# Patient Record
Sex: Female | Born: 1955
Health system: Southern US, Community
[De-identification: ages and names within clinical notes are randomized; demographics above are authoritative.]

## PROBLEM LIST (undated history)

## (undated) DIAGNOSIS — Z9884 Bariatric surgery status: Secondary | ICD-10-CM

## (undated) DIAGNOSIS — H2513 Age-related nuclear cataract, bilateral: Secondary | ICD-10-CM

## (undated) DIAGNOSIS — T4145XA Adverse effect of unspecified anesthetic, initial encounter: Secondary | ICD-10-CM

## (undated) DIAGNOSIS — R011 Cardiac murmur, unspecified: Secondary | ICD-10-CM

## (undated) DIAGNOSIS — E785 Hyperlipidemia, unspecified: Secondary | ICD-10-CM

## (undated) DIAGNOSIS — M7580 Other shoulder lesions, unspecified shoulder: Secondary | ICD-10-CM

## (undated) DIAGNOSIS — G4733 Obstructive sleep apnea (adult) (pediatric): Secondary | ICD-10-CM

## (undated) DIAGNOSIS — G629 Polyneuropathy, unspecified: Secondary | ICD-10-CM

## (undated) DIAGNOSIS — F329 Major depressive disorder, single episode, unspecified: Secondary | ICD-10-CM

## (undated) DIAGNOSIS — E119 Type 2 diabetes mellitus without complications: Secondary | ICD-10-CM

## (undated) DIAGNOSIS — IMO0001 Reserved for inherently not codable concepts without codable children: Secondary | ICD-10-CM

## (undated) DIAGNOSIS — E669 Obesity, unspecified: Secondary | ICD-10-CM

## (undated) DIAGNOSIS — H409 Unspecified glaucoma: Secondary | ICD-10-CM

## (undated) DIAGNOSIS — Z9989 Dependence on other enabling machines and devices: Secondary | ICD-10-CM

## (undated) DIAGNOSIS — Z8669 Personal history of other diseases of the nervous system and sense organs: Secondary | ICD-10-CM

## (undated) DIAGNOSIS — R350 Frequency of micturition: Secondary | ICD-10-CM

## (undated) DIAGNOSIS — R51 Headache: Secondary | ICD-10-CM

## (undated) DIAGNOSIS — E041 Nontoxic single thyroid nodule: Secondary | ICD-10-CM

## (undated) DIAGNOSIS — Z789 Other specified health status: Secondary | ICD-10-CM

## (undated) DIAGNOSIS — T8859XA Other complications of anesthesia, initial encounter: Secondary | ICD-10-CM

## (undated) DIAGNOSIS — G5600 Carpal tunnel syndrome, unspecified upper limb: Secondary | ICD-10-CM

## (undated) DIAGNOSIS — F32A Depression, unspecified: Secondary | ICD-10-CM

## (undated) DIAGNOSIS — F419 Anxiety disorder, unspecified: Secondary | ICD-10-CM

## (undated) DIAGNOSIS — H532 Diplopia: Secondary | ICD-10-CM

## (undated) DIAGNOSIS — T7840XA Allergy, unspecified, initial encounter: Secondary | ICD-10-CM

## (undated) DIAGNOSIS — D414 Neoplasm of uncertain behavior of bladder: Secondary | ICD-10-CM

## (undated) DIAGNOSIS — E559 Vitamin D deficiency, unspecified: Secondary | ICD-10-CM

## (undated) DIAGNOSIS — K648 Other hemorrhoids: Secondary | ICD-10-CM

## (undated) DIAGNOSIS — N951 Menopausal and female climacteric states: Secondary | ICD-10-CM

## (undated) DIAGNOSIS — G7000 Myasthenia gravis without (acute) exacerbation: Secondary | ICD-10-CM

## (undated) DIAGNOSIS — I10 Essential (primary) hypertension: Secondary | ICD-10-CM

## (undated) DIAGNOSIS — Z6841 Body Mass Index (BMI) 40.0 and over, adult: Secondary | ICD-10-CM

## (undated) DIAGNOSIS — J45909 Unspecified asthma, uncomplicated: Secondary | ICD-10-CM

## (undated) DIAGNOSIS — R519 Headache, unspecified: Secondary | ICD-10-CM

## (undated) DIAGNOSIS — G4730 Sleep apnea, unspecified: Secondary | ICD-10-CM

## (undated) HISTORY — DX: Neoplasm of uncertain behavior of bladder: D41.4

## (undated) HISTORY — DX: Vitamin D deficiency, unspecified: E55.9

## (undated) HISTORY — DX: Carpal tunnel syndrome, unspecified upper limb: G56.00

## (undated) HISTORY — DX: Type 2 diabetes mellitus without complications: E11.9

## (undated) HISTORY — DX: Sleep apnea, unspecified: G47.30

## (undated) HISTORY — PX: EYE SURGERY: SHX253

## (undated) HISTORY — DX: Depression, unspecified: F32.A

## (undated) HISTORY — DX: Other shoulder lesions, unspecified shoulder: M75.80

## (undated) HISTORY — DX: Obesity, unspecified: E66.9

## (undated) HISTORY — DX: Bariatric surgery status: Z98.84

## (undated) HISTORY — DX: Age-related nuclear cataract, bilateral: H25.13

## (undated) HISTORY — PX: KNEE SURGERY: SHX244

## (undated) HISTORY — DX: Headache, unspecified: R51.9

## (undated) HISTORY — DX: Frequency of micturition: R35.0

## (undated) HISTORY — DX: Hyperlipidemia, unspecified: E78.5

## (undated) HISTORY — DX: Major depressive disorder, single episode, unspecified: F32.9

## (undated) HISTORY — DX: Allergy, unspecified, initial encounter: T78.40XA

## (undated) HISTORY — DX: Anxiety disorder, unspecified: F41.9

## (undated) HISTORY — DX: Unspecified glaucoma: H40.9

## (undated) HISTORY — PX: OOPHORECTOMY: SHX86

## (undated) HISTORY — DX: Essential (primary) hypertension: I10

## (undated) HISTORY — PX: THYROID SURGERY: SHX805

## (undated) HISTORY — DX: Unspecified asthma, uncomplicated: J45.909

## (undated) HISTORY — DX: Other hemorrhoids: K64.8

## (undated) HISTORY — DX: Polyneuropathy, unspecified: G62.9

## (undated) HISTORY — DX: Dependence on other enabling machines and devices: Z99.89

## (undated) HISTORY — DX: Menopausal and female climacteric states: N95.1

## (undated) HISTORY — DX: Headache: R51

## (undated) HISTORY — PX: UPPER GI ENDOSCOPY: SHX6162

## (undated) HISTORY — DX: Diplopia: H53.2

## (undated) HISTORY — DX: Obstructive sleep apnea (adult) (pediatric): G47.33

---
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
## 1989-06-24 HISTORY — PX: ABDOMINAL HYSTERECTOMY: SHX81

## 2004-06-18 ENCOUNTER — Emergency Department: Payer: Self-pay | Admitting: Emergency Medicine

## 2004-06-22 ENCOUNTER — Ambulatory Visit: Payer: Self-pay

## 2004-07-04 ENCOUNTER — Ambulatory Visit: Payer: Self-pay

## 2004-07-12 ENCOUNTER — Ambulatory Visit: Payer: Self-pay | Admitting: Unknown Physician Specialty

## 2004-07-13 ENCOUNTER — Ambulatory Visit: Payer: Self-pay

## 2004-07-16 ENCOUNTER — Ambulatory Visit: Payer: Self-pay | Admitting: Urology

## 2005-11-06 ENCOUNTER — Ambulatory Visit: Payer: Self-pay

## 2006-01-11 ENCOUNTER — Ambulatory Visit: Payer: Self-pay | Admitting: Internal Medicine

## 2006-07-02 ENCOUNTER — Ambulatory Visit: Payer: Self-pay | Admitting: Family Medicine

## 2006-11-25 ENCOUNTER — Ambulatory Visit: Payer: Self-pay | Admitting: Internal Medicine

## 2006-12-10 ENCOUNTER — Ambulatory Visit: Payer: Self-pay | Admitting: Gastroenterology

## 2006-12-10 LAB — HM COLONOSCOPY: HM Colonoscopy: NORMAL

## 2007-02-13 ENCOUNTER — Ambulatory Visit: Payer: Self-pay | Admitting: Family Medicine

## 2007-04-29 ENCOUNTER — Ambulatory Visit: Payer: Self-pay | Admitting: Internal Medicine

## 2007-12-29 LAB — HM DEXA SCAN

## 2008-01-01 ENCOUNTER — Ambulatory Visit: Payer: Self-pay | Admitting: Family Medicine

## 2008-01-04 ENCOUNTER — Ambulatory Visit: Payer: Self-pay | Admitting: Family Medicine

## 2008-02-04 ENCOUNTER — Ambulatory Visit: Payer: Self-pay | Admitting: Internal Medicine

## 2008-09-16 ENCOUNTER — Ambulatory Visit: Payer: Self-pay | Admitting: Family Medicine

## 2009-04-17 ENCOUNTER — Other Ambulatory Visit: Payer: Self-pay | Admitting: Family Medicine

## 2009-06-02 ENCOUNTER — Ambulatory Visit: Payer: Self-pay | Admitting: Internal Medicine

## 2009-07-25 DIAGNOSIS — M7580 Other shoulder lesions, unspecified shoulder: Secondary | ICD-10-CM

## 2009-07-25 HISTORY — DX: Other shoulder lesions, unspecified shoulder: M75.80

## 2009-08-01 ENCOUNTER — Encounter: Payer: Self-pay | Admitting: Orthopedic Surgery

## 2009-08-22 ENCOUNTER — Encounter: Payer: Self-pay | Admitting: Orthopedic Surgery

## 2010-04-19 ENCOUNTER — Ambulatory Visit: Payer: Self-pay

## 2010-06-24 HISTORY — PX: COLONOSCOPY: SHX174

## 2010-09-04 ENCOUNTER — Ambulatory Visit: Payer: Self-pay | Admitting: Family Medicine

## 2010-09-20 LAB — HM PAP SMEAR: HM Pap smear: NORMAL

## 2010-09-23 ENCOUNTER — Ambulatory Visit: Payer: Self-pay | Admitting: Family Medicine

## 2010-10-17 ENCOUNTER — Ambulatory Visit: Payer: Self-pay | Admitting: Family Medicine

## 2010-10-23 ENCOUNTER — Ambulatory Visit: Payer: Self-pay | Admitting: Family Medicine

## 2010-10-24 ENCOUNTER — Other Ambulatory Visit: Payer: Self-pay | Admitting: Family Medicine

## 2010-11-05 ENCOUNTER — Ambulatory Visit: Payer: Self-pay | Admitting: Urology

## 2010-11-16 ENCOUNTER — Ambulatory Visit: Payer: Self-pay | Admitting: Family Medicine

## 2011-04-23 ENCOUNTER — Ambulatory Visit: Payer: Self-pay | Admitting: Family Medicine

## 2011-06-22 ENCOUNTER — Ambulatory Visit: Payer: Self-pay | Admitting: Family Medicine

## 2011-07-08 ENCOUNTER — Ambulatory Visit: Payer: Self-pay | Admitting: Internal Medicine

## 2011-09-10 ENCOUNTER — Ambulatory Visit: Payer: Self-pay | Admitting: Family Medicine

## 2011-09-10 LAB — COMPREHENSIVE METABOLIC PANEL
Albumin: 3.6 g/dL (ref 3.4–5.0)
Alkaline Phosphatase: 65 U/L (ref 50–136)
Anion Gap: 9 (ref 7–16)
BUN: 15 mg/dL (ref 7–18)
Bilirubin,Total: 0.4 mg/dL (ref 0.2–1.0)
Chloride: 103 mmol/L (ref 98–107)
Creatinine: 0.91 mg/dL (ref 0.60–1.30)
Glucose: 104 mg/dL — ABNORMAL HIGH (ref 65–99)
SGOT(AST): 23 U/L (ref 15–37)
SGPT (ALT): 44 U/L
Total Protein: 7.8 g/dL (ref 6.4–8.2)

## 2011-09-16 ENCOUNTER — Ambulatory Visit: Payer: Self-pay

## 2012-01-01 ENCOUNTER — Encounter: Payer: Self-pay | Admitting: Family Medicine

## 2012-01-23 ENCOUNTER — Encounter: Payer: Self-pay | Admitting: Family Medicine

## 2012-05-08 ENCOUNTER — Ambulatory Visit: Payer: Self-pay | Admitting: Family Medicine

## 2012-05-08 LAB — LIPID PANEL
Cholesterol: 153 mg/dL (ref 0–200)
HDL Cholesterol: 46 mg/dL (ref 40–60)
Ldl Cholesterol, Calc: 77 mg/dL (ref 0–100)
Triglycerides: 152 mg/dL (ref 0–200)
VLDL Cholesterol, Calc: 30 mg/dL (ref 5–40)

## 2012-05-18 ENCOUNTER — Ambulatory Visit: Payer: Self-pay | Admitting: Specialist

## 2012-05-18 LAB — CBC WITH DIFFERENTIAL/PLATELET
Basophil #: 0.1 10*3/uL (ref 0.0–0.1)
Eosinophil #: 0.1 10*3/uL (ref 0.0–0.7)
Eosinophil %: 0.9 %
Lymphocyte %: 34.2 %
MCHC: 33.6 g/dL (ref 32.0–36.0)
Monocyte #: 0.7 x10 3/mm (ref 0.2–0.9)
Monocyte %: 5.9 %
Neutrophil #: 7.1 10*3/uL — ABNORMAL HIGH (ref 1.4–6.5)
Neutrophil %: 58.6 %
Platelet: 308 10*3/uL (ref 150–440)
RDW: 14 % (ref 11.5–14.5)
WBC: 12.1 10*3/uL — ABNORMAL HIGH (ref 3.6–11.0)

## 2012-05-18 LAB — PHOSPHORUS: Phosphorus: 4.1 mg/dL (ref 2.5–4.9)

## 2012-05-18 LAB — FOLATE: Folic Acid: 65.8 ng/mL (ref 3.1–100.0)

## 2012-05-18 LAB — COMPREHENSIVE METABOLIC PANEL
Albumin: 3.7 g/dL (ref 3.4–5.0)
Alkaline Phosphatase: 63 U/L (ref 50–136)
Anion Gap: 10 (ref 7–16)
BUN: 19 mg/dL — ABNORMAL HIGH (ref 7–18)
Calcium, Total: 9.3 mg/dL (ref 8.5–10.1)
Co2: 24 mmol/L (ref 21–32)
Creatinine: 0.89 mg/dL (ref 0.60–1.30)
SGPT (ALT): 52 U/L (ref 12–78)
Sodium: 137 mmol/L (ref 136–145)
Total Protein: 8.1 g/dL (ref 6.4–8.2)

## 2012-05-18 LAB — PROTIME-INR
INR: 0.9
Prothrombin Time: 12.7 secs (ref 11.5–14.7)

## 2012-05-18 LAB — APTT: Activated PTT: 27.2 secs (ref 23.6–35.9)

## 2012-05-18 LAB — FERRITIN: Ferritin (ARMC): 105 ng/mL (ref 8–388)

## 2012-05-18 LAB — HEMOGLOBIN A1C: Hemoglobin A1C: 6.1 % (ref 4.2–6.3)

## 2012-05-18 LAB — IRON AND TIBC: Unbound Iron-Bind.Cap.: 299 ug/dL

## 2012-05-18 LAB — LIPASE, BLOOD: Lipase: 186 U/L (ref 73–393)

## 2012-05-18 LAB — AMYLASE: Amylase: 62 U/L (ref 25–115)

## 2012-05-18 LAB — BILIRUBIN, DIRECT: Bilirubin, Direct: 0.1 mg/dL (ref 0.00–0.20)

## 2012-06-12 ENCOUNTER — Ambulatory Visit: Payer: Self-pay | Admitting: Specialist

## 2012-06-24 ENCOUNTER — Ambulatory Visit: Payer: Self-pay | Admitting: Specialist

## 2012-06-24 HISTORY — PX: BARIATRIC SURGERY: SHX1103

## 2012-07-25 ENCOUNTER — Ambulatory Visit: Payer: Self-pay | Admitting: Specialist

## 2012-08-22 ENCOUNTER — Ambulatory Visit: Payer: Self-pay | Admitting: Specialist

## 2012-08-26 ENCOUNTER — Ambulatory Visit: Payer: Self-pay | Admitting: Gastroenterology

## 2012-08-27 LAB — PATHOLOGY REPORT

## 2012-10-05 ENCOUNTER — Ambulatory Visit: Payer: Self-pay | Admitting: Specialist

## 2012-10-05 LAB — CBC
HCT: 38.9 % (ref 35.0–47.0)
HGB: 13.4 g/dL (ref 12.0–16.0)
MCH: 28.9 pg (ref 26.0–34.0)
MCHC: 34.3 g/dL (ref 32.0–36.0)
MCV: 84 fL (ref 80–100)
Platelet: 300 10*3/uL (ref 150–440)
RBC: 4.62 10*6/uL (ref 3.80–5.20)

## 2012-10-05 LAB — BASIC METABOLIC PANEL
Anion Gap: 8 (ref 7–16)
BUN: 20 mg/dL — ABNORMAL HIGH (ref 7–18)
Chloride: 102 mmol/L (ref 98–107)
EGFR (Non-African Amer.): >60
Glucose: 102 mg/dL — ABNORMAL HIGH (ref 65–99)
Potassium: 3.8 mmol/L (ref 3.5–5.1)
Sodium: 137 mmol/L (ref 136–145)

## 2012-10-12 ENCOUNTER — Inpatient Hospital Stay: Payer: Self-pay | Admitting: Specialist

## 2012-10-12 DIAGNOSIS — Z9884 Bariatric surgery status: Secondary | ICD-10-CM | POA: Insufficient documentation

## 2012-10-13 LAB — BASIC METABOLIC PANEL
Anion Gap: 8 (ref 7–16)
Chloride: 106 mmol/L (ref 98–107)
Co2: 24 mmol/L (ref 21–32)
Creatinine: 0.55 mg/dL — ABNORMAL LOW (ref 0.60–1.30)
EGFR (Non-African Amer.): >60
Glucose: 100 mg/dL — ABNORMAL HIGH (ref 65–99)
Osmolality: 275 (ref 275–301)
Sodium: 138 mmol/L (ref 136–145)

## 2012-10-13 LAB — CBC WITH DIFFERENTIAL/PLATELET
Basophil #: 0 10*3/uL (ref 0.0–0.1)
HCT: 33.9 % — ABNORMAL LOW (ref 35.0–47.0)
HGB: 11.3 g/dL — ABNORMAL LOW (ref 12.0–16.0)
Lymphocyte %: 19.1 %
MCH: 28.1 pg (ref 26.0–34.0)
MCHC: 33.4 g/dL (ref 32.0–36.0)
MCV: 84 fL (ref 80–100)
Monocyte %: 6.1 %
Neutrophil %: 74.6 %
RBC: 4.03 10*6/uL (ref 3.80–5.20)
WBC: 12.8 10*3/uL — ABNORMAL HIGH (ref 3.6–11.0)

## 2012-10-13 LAB — MAGNESIUM: Magnesium: 1.7 mg/dL — ABNORMAL LOW

## 2012-10-13 LAB — PHOSPHORUS: Phosphorus: 3.2 mg/dL (ref 2.5–4.9)

## 2012-10-14 LAB — BASIC METABOLIC PANEL
Anion Gap: 6 — ABNORMAL LOW (ref 7–16)
BUN: 6 mg/dL — ABNORMAL LOW (ref 7–18)
Co2: 24 mmol/L (ref 21–32)
Creatinine: 0.56 mg/dL — ABNORMAL LOW (ref 0.60–1.30)
EGFR (African American): >60
EGFR (Non-African Amer.): >60
Glucose: 89 mg/dL (ref 65–99)
Potassium: 3.7 mmol/L (ref 3.5–5.1)

## 2012-10-14 LAB — CBC WITH DIFFERENTIAL/PLATELET
Basophil #: 0 10*3/uL (ref 0.0–0.1)
Eosinophil %: 0.5 %
HGB: 11.6 g/dL — ABNORMAL LOW (ref 12.0–16.0)
Lymphocyte #: 4.2 10*3/uL — ABNORMAL HIGH (ref 1.0–3.6)
Lymphocyte %: 34.5 %
MCH: 28.5 pg (ref 26.0–34.0)
Monocyte #: 0.9 x10 3/mm (ref 0.2–0.9)
Monocyte %: 7.8 %
Neutrophil %: 56.8 %
RDW: 14.2 % (ref 11.5–14.5)
WBC: 12.3 10*3/uL — ABNORMAL HIGH (ref 3.6–11.0)

## 2012-10-14 LAB — ALBUMIN: Albumin: 3.2 g/dL — ABNORMAL LOW (ref 3.4–5.0)

## 2012-10-14 LAB — MAGNESIUM: Magnesium: 1.7 mg/dL — ABNORMAL LOW

## 2012-10-30 ENCOUNTER — Other Ambulatory Visit: Payer: Self-pay | Admitting: Family Medicine

## 2012-11-02 ENCOUNTER — Ambulatory Visit: Payer: Self-pay | Admitting: Specialist

## 2012-11-22 ENCOUNTER — Ambulatory Visit: Payer: Self-pay | Admitting: Specialist

## 2013-04-23 ENCOUNTER — Ambulatory Visit: Payer: Self-pay | Admitting: Family Medicine

## 2013-05-11 ENCOUNTER — Ambulatory Visit: Payer: Self-pay | Admitting: Family Medicine

## 2013-05-19 ENCOUNTER — Ambulatory Visit: Payer: Self-pay | Admitting: Specialist

## 2013-05-19 LAB — AMYLASE: Amylase: 74 U/L (ref 25–115)

## 2013-05-19 LAB — COMPREHENSIVE METABOLIC PANEL
Alkaline Phosphatase: 52 U/L
Anion Gap: 7 (ref 7–16)
Bilirubin,Total: 0.4 mg/dL (ref 0.2–1.0)
Calcium, Total: 9.5 mg/dL (ref 8.5–10.1)
Chloride: 100 mmol/L (ref 98–107)
Creatinine: 0.71 mg/dL (ref 0.60–1.30)
EGFR (African American): >60
EGFR (Non-African Amer.): >60
Osmolality: 278 (ref 275–301)
Potassium: 4 mmol/L (ref 3.5–5.1)
Total Protein: 8 g/dL (ref 6.4–8.2)

## 2013-05-19 LAB — CBC WITH DIFFERENTIAL/PLATELET
Basophil %: 0.6 %
HCT: 40.1 % (ref 35.0–47.0)
HGB: 14 g/dL (ref 12.0–16.0)
Lymphocyte #: 4.7 10*3/uL — ABNORMAL HIGH (ref 1.0–3.6)
Lymphocyte %: 37.2 %
MCH: 30.7 pg (ref 26.0–34.0)
MCHC: 34.8 g/dL (ref 32.0–36.0)
MCV: 88 fL (ref 80–100)
Monocyte #: 0.9 x10 3/mm (ref 0.2–0.9)
Monocyte %: 6.9 %
Neutrophil #: 6.8 10*3/uL — ABNORMAL HIGH (ref 1.4–6.5)
Neutrophil %: 54.1 %
RBC: 4.55 10*6/uL (ref 3.80–5.20)

## 2013-05-19 LAB — FOLATE: Folic Acid: 45.9 ng/mL (ref 3.1–100.0)

## 2013-05-19 LAB — IRON AND TIBC
Iron Bind.Cap.(Total): 323 ug/dL (ref 250–450)
Iron: 65 ug/dL (ref 50–170)
Unbound Iron-Bind.Cap.: 258 ug/dL

## 2013-05-19 LAB — FERRITIN: Ferritin (ARMC): 121 ng/mL (ref 8–388)

## 2013-05-27 ENCOUNTER — Encounter: Payer: Self-pay | Admitting: *Deleted

## 2013-06-05 ENCOUNTER — Ambulatory Visit: Payer: Self-pay | Admitting: Family Medicine

## 2013-06-14 ENCOUNTER — Other Ambulatory Visit: Payer: 59

## 2013-06-14 ENCOUNTER — Encounter: Payer: Self-pay | Admitting: General Surgery

## 2013-06-14 ENCOUNTER — Ambulatory Visit (INDEPENDENT_AMBULATORY_CARE_PROVIDER_SITE_OTHER): Payer: 59 | Admitting: General Surgery

## 2013-06-14 VITALS — BP 122/80 | HR 78 | Resp 12 | Ht 62.5 in | Wt 195.0 lb

## 2013-06-14 DIAGNOSIS — N63 Unspecified lump in unspecified breast: Secondary | ICD-10-CM

## 2013-06-14 NOTE — Patient Instructions (Signed)
Patient to return in 3 months for follow up. Patient to continue self breast checks. She is also advised to contact our office with any new questions or concerns in the meantime.

## 2013-06-14 NOTE — Progress Notes (Signed)
Patient ID: Jasmine Woods, female   DOB: June 02, 1956, 57 y.o.   MRN: [id number]  Chief Complaint  Patient presents with  . Other    left breast lump    HPI Jasmine Woods is a 57 y.o. female who presents for an evaluation of  2 right breast lumps and 1 left breast lump. Her most recent mammogram was done on 05/11/13 with a birad category 1. The patient states she noticed the lumps approximately 1 month ago. She states she does not check her breasts regularly and has not had consistent mammograms done. She denies any pain in these areas. No known family or personal history of breast problems.  HPI  Past Medical History  Diagnosis Date  . Hypertension   . Diabetes mellitus without complication   . Sleep apnea   . Depression   . Asthma     Past Surgical History  Procedure Laterality Date  . Bariatric surgery  2014  . Upper gi endoscopy    . Colonoscopy  2012  . Abdominal hysterectomy  1991  . Knee surgery Right 1979-1980    History reviewed. No pertinent family history.  Social History History  Substance Use Topics  . Smoking status: Former Smoker -- 3 years  . Smokeless tobacco: Never Used  . Alcohol Use: No    No Known Allergies  Current Outpatient Prescriptions  Medication Sig Dispense Refill  . acetaminophen (TYLENOL) 500 MG tablet Take 500 mg by mouth every 6 (six) hours as needed.      Marland Kitchen b complex vitamins tablet Take 1 tablet by mouth daily.      . budesonide (RHINOCORT AQUA) 32 MCG/ACT nasal spray Place into both nostrils daily.      . cholecalciferol (VITAMIN D) 1000 UNITS tablet Take 1,000 Units by mouth daily.      . cyanocobalamin 500 MCG tablet Take 500 mcg by mouth daily.      . DULoxetine (CYMBALTA) 60 MG capsule Take 60 mg by mouth daily.      . Fluticasone Propionate, Inhal, (FLOVENT IN) Inhale into the lungs as needed.      . Hypodermic Needles-Disposable (FLOW-EZE VENTED NEEDLE) MISC 1 tablet by Does not apply route.      . loratadine  (CLARITIN) 10 MG tablet Take 10 mg by mouth daily.      . Multiple Vitamins-Minerals (MULTIVITAMIN WITH MINERALS) tablet Take 1 tablet by mouth daily.       No current facility-administered medications for this visit.    Review of Systems Review of Systems  Constitutional: Negative.   Respiratory: Negative.   Cardiovascular: Negative.     Blood pressure 122/80, pulse 78, resp. rate 12, height 5' 2.5" (1.588 m), weight 195 lb (88.451 kg).  Physical Exam Physical Exam  Constitutional: She is oriented to person, place, and time. She appears well-developed and well-nourished.  Eyes: Conjunctivae are normal. No scleral icterus.  Neck: No thyromegaly present.  Cardiovascular: Normal rate, regular rhythm and normal heart sounds.   No murmur heard. Pulmonary/Chest: Effort normal and breath sounds normal. Right breast exhibits mass (1 cm right breast mass 12 o'clock 4 cm from nipple. ). Right breast exhibits no inverted nipple, no nipple discharge, no skin change and no tenderness. Left breast exhibits no inverted nipple, no mass, no nipple discharge, no skin change and no tenderness.  Both breast has a moderate  nodular  feel throughout.   Lymphadenopathy:    She has no cervical adenopathy.    She  has no axillary adenopathy.  Neurological: She is alert and oriented to person, place, and time.  Skin: Skin is warm and dry.    Data Reviewed Mammogram from 05/11/13-normal. [place] right breast today- Normal. Assessment    Likely a small lipoma in the right breast.     Plan    Patient to return in 3 months for follow up.        Shaelee Forni G 06/15/2013, 3:01 PM

## 2013-06-15 ENCOUNTER — Encounter: Payer: Self-pay | Admitting: General Surgery

## 2013-06-15 ENCOUNTER — Encounter: Payer: Self-pay | Admitting: *Deleted

## 2013-06-21 ENCOUNTER — Ambulatory Visit: Payer: Self-pay | Admitting: Family Medicine

## 2013-08-11 ENCOUNTER — Ambulatory Visit: Payer: Self-pay | Admitting: Family Medicine

## 2013-08-11 LAB — RAPID INFLUENZA A&B ANTIGENS (ARMC ONLY)

## 2013-09-14 ENCOUNTER — Ambulatory Visit (INDEPENDENT_AMBULATORY_CARE_PROVIDER_SITE_OTHER): Payer: 59 | Admitting: General Surgery

## 2013-09-14 ENCOUNTER — Encounter: Payer: Self-pay | Admitting: General Surgery

## 2013-09-14 VITALS — BP 154/80 | HR 68 | Resp 16 | Ht 62.5 in | Wt 200.0 lb

## 2013-09-14 DIAGNOSIS — N63 Unspecified lump in unspecified breast: Secondary | ICD-10-CM

## 2013-09-14 NOTE — Patient Instructions (Signed)
Pt to return for routine yearly follow up. Continue self breast exams. Call office for any new breast issues or concerns.

## 2013-09-14 NOTE — Progress Notes (Signed)
Patient ID: Jasmine Woods, female   DOB: Nov 19, 1955, 58 y.o.   MRN: [id number]  Chief Complaint  Patient presents with  . Follow-up    breast mass    HPI Jasmine Woods is a 58 y.o. female.  Here today for follow up breast evaluation. She states the right breast mass feels the same to her and has not changed in size. HPI  Past Medical History  Diagnosis Date  . Hypertension   . Diabetes mellitus without complication   . Sleep apnea   . Depression   . Asthma     Past Surgical History  Procedure Laterality Date  . Bariatric surgery  2014  . Upper gi endoscopy    . Colonoscopy  2012  . Abdominal hysterectomy  1991  . Knee surgery Right 1979-1980    History reviewed. No pertinent family history.  Social History History  Substance Use Topics  . Smoking status: Former Smoker -- 3 years  . Smokeless tobacco: Never Used  . Alcohol Use: No    No Known Allergies  Current Outpatient Prescriptions  Medication Sig Dispense Refill  . acetaminophen (TYLENOL) 500 MG tablet Take 500 mg by mouth every 6 (six) hours as needed.      Marland Kitchen b complex vitamins tablet Take 1 tablet by mouth daily.      . budesonide (RHINOCORT AQUA) 32 MCG/ACT nasal spray Place into both nostrils daily.      . cholecalciferol (VITAMIN D) 1000 UNITS tablet Take 1,000 Units by mouth daily.      . cyanocobalamin 500 MCG tablet Take 500 mcg by mouth daily.      . DULoxetine (CYMBALTA) 60 MG capsule Take 60 mg by mouth daily.      Marland Kitchen loratadine (CLARITIN) 10 MG tablet Take 10 mg by mouth daily.      . Multiple Vitamins-Minerals (MULTIVITAMIN WITH MINERALS) tablet Take 1 tablet by mouth daily.      . nortriptyline (PAMELOR) 10 MG capsule Take 10 mg by mouth at bedtime.       No current facility-administered medications for this visit.    Review of Systems Review of Systems  Constitutional: Negative.   Respiratory: Negative.   Cardiovascular: Negative.     Blood pressure 154/80, pulse 68, resp. rate  16, height 5' 2.5" (1.588 m), weight 200 lb (90.719 kg).  Physical Exam Physical Exam  Constitutional: She is oriented to person, place, and time. She appears well-developed and well-nourished.  Eyes: Conjunctivae are normal. No scleral icterus.  Neck: Neck supple. No thyromegaly present.  Pulmonary/Chest: Right breast exhibits mass (Mass at 12oclock appears smaller and less distinct. ). Right breast exhibits no inverted nipple, no nipple discharge, no skin change and no tenderness. Left breast exhibits no inverted nipple, no mass, no nipple discharge, no skin change and no tenderness.  Lymphadenopathy:    She has no cervical adenopathy.    She has no axillary adenopathy.  Neurological: She is alert and oriented to person, place, and time.  Skin: Skin is warm and dry.    Data Reviewed None  Assessment    Benign findings in both breasts.    Plan    Will return for routine yearly follow up.       Roderic Lammert G 09/14/2013, 4:48 PM

## 2014-04-08 LAB — LIPID PANEL
Cholesterol: 224 mg/dL — AB (ref 0–200)
HDL: 70 mg/dL (ref 35–70)
LDL CALC: 139 mg/dL
Triglycerides: 75 mg/dL (ref 40–160)

## 2014-04-25 ENCOUNTER — Encounter: Payer: Self-pay | Admitting: General Surgery

## 2014-05-12 ENCOUNTER — Ambulatory Visit: Payer: Self-pay | Admitting: General Surgery

## 2014-05-12 ENCOUNTER — Encounter: Payer: Self-pay | Admitting: General Surgery

## 2014-05-12 LAB — HM MAMMOGRAPHY: HM Mammogram: NORMAL

## 2014-05-18 ENCOUNTER — Encounter: Payer: Self-pay | Admitting: General Surgery

## 2014-05-18 ENCOUNTER — Ambulatory Visit (INDEPENDENT_AMBULATORY_CARE_PROVIDER_SITE_OTHER): Payer: 59 | Admitting: General Surgery

## 2014-05-18 VITALS — BP 128/76 | HR 78 | Resp 12 | Ht 62.5 in | Wt 209.0 lb

## 2014-05-18 DIAGNOSIS — N63 Unspecified lump in unspecified breast: Secondary | ICD-10-CM

## 2014-05-18 NOTE — Patient Instructions (Signed)
Patient to return as needed. Continue self breast exams. Call office for any new breast issues or concerns.'  

## 2014-05-18 NOTE — Progress Notes (Signed)
Patient ID: Jasmine Woods, female   DOB: [birth year], 58 y.o.   MRN: [id number]  Chief Complaint  Patient presents with  . Follow-up    mammogram     HPI Jasmine Woods is a 58 y.o. female who presents for a breast evaluation. The most recent mammogram was done on 05/12/14. Patient does perform regular self breast checks and gets regular mammograms done.  The patient denies any new problems with her breasts at this time.    HPI  Past Medical History  Diagnosis Date  . Hypertension   . Diabetes mellitus without complication   . Sleep apnea   . Depression   . Asthma     Past Surgical History  Procedure Laterality Date  . Bariatric surgery  2014  . Upper gi endoscopy    . Colonoscopy  2012  . Abdominal hysterectomy  1991  . Knee surgery Right 1979-1980    History reviewed. No pertinent family history.  Social History History  Substance Use Topics  . Smoking status: Former Smoker -- 3 years  . Smokeless tobacco: Never Used  . Alcohol Use: No    No Known Allergies  Current Outpatient Prescriptions  Medication Sig Dispense Refill  . acetaminophen (TYLENOL) 500 MG tablet Take 500 mg by mouth every 6 (six) hours as needed.    Marland Kitchen atorvastatin (LIPITOR) 40 MG tablet   1  . b complex vitamins tablet Take 1 tablet by mouth daily.    . budesonide (RHINOCORT AQUA) 32 MCG/ACT nasal spray Place into both nostrils daily.    . cholecalciferol (VITAMIN D) 1000 UNITS tablet Take 1,000 Units by mouth daily.    . cyanocobalamin 500 MCG tablet Take 500 mcg by mouth daily.    . DULoxetine (CYMBALTA) 60 MG capsule Take 60 mg by mouth daily.    Marland Kitchen loratadine (CLARITIN) 10 MG tablet Take 10 mg by mouth daily.    . montelukast (SINGULAIR) 10 MG tablet   0  . Multiple Vitamins-Minerals (MULTIVITAMIN WITH MINERALS) tablet Take 1 tablet by mouth daily.    . nortriptyline (PAMELOR) 10 MG capsule Take 10 mg by mouth at bedtime.     No current facility-administered  medications for this visit.    Review of Systems Review of Systems  Constitutional: Negative.   Respiratory: Negative.   Cardiovascular: Negative.     Blood pressure 128/76, pulse 78, resp. rate 12, height 5' 2.5" (1.588 m), weight 209 lb (94.802 kg).  Physical Exam Physical Exam  Constitutional: She is oriented to person, place, and time. She appears well-developed and well-nourished.  Eyes: Conjunctivae are normal. No scleral icterus.  Neck: Neck supple. No thyromegaly present.  Cardiovascular: Normal rate, regular rhythm and normal heart sounds.   No murmur heard. Pulmonary/Chest: Effort normal and breath sounds normal. Right breast exhibits no inverted nipple, no mass, no nipple discharge, no skin change and no tenderness. Left breast exhibits no inverted nipple, no mass, no nipple discharge, no skin change and no tenderness.  Lymphadenopathy:    She has no cervical adenopathy.    She has no axillary adenopathy.  Neurological: She is alert and oriented to person, place, and time.  Skin: Skin is warm and dry.  No palpable mas in either breast  Data Reviewed Mammogram reviewed and stable.   Assessment    Stable exam. Findings of 1 yr ago in both breasts resolved.    Plan    Patient to follow up as needed. Primary care to take over  scheduling yearly mammograms and breast checks.        Ion Gonnella G 05/23/2014, 6:30 AM

## 2014-05-23 ENCOUNTER — Encounter: Payer: Self-pay | Admitting: General Surgery

## 2014-08-16 DIAGNOSIS — R51 Headache: Secondary | ICD-10-CM

## 2014-08-16 DIAGNOSIS — R519 Headache, unspecified: Secondary | ICD-10-CM | POA: Insufficient documentation
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
## 2014-10-17 ENCOUNTER — Ambulatory Visit
Admit: 2014-10-17 | Disposition: A | Discharge status: Home / Self Care | Payer: Self-pay | Attending: Family Medicine | Admitting: Family Medicine

## 2014-10-17 LAB — COMPREHENSIVE METABOLIC PANEL
ALBUMIN: 4.3 g/dL
ANION GAP: 9 (ref 7–16)
AST: 25 U/L
Alkaline Phosphatase: 36 U/L — ABNORMAL LOW
BUN: 21 mg/dL — ABNORMAL HIGH
Bilirubin,Total: 0.5 mg/dL
CALCIUM: 9.5 mg/dL
CO2: 28 mmol/L
Chloride: 102 mmol/L
Creatinine: 0.51 mg/dL
EGFR (African American): >60
Glucose: 99 mg/dL
Potassium: 4.4 mmol/L
SGPT (ALT): 25 U/L
SODIUM: 139 mmol/L
Total Protein: 7.6 g/dL

## 2014-10-17 LAB — CBC WITH DIFFERENTIAL/PLATELET
BASOS ABS: 0.1 10*3/uL (ref 0.0–0.1)
BASOS PCT: 0.6 %
Eosinophil #: 0.1 10*3/uL (ref 0.0–0.7)
Eosinophil %: 0.8 %
HCT: 40.1 % (ref 35.0–47.0)
HGB: 13.8 g/dL (ref 12.0–16.0)
LYMPHS PCT: 32 %
Lymphocyte #: 3.6 10*3/uL (ref 1.0–3.6)
MCH: 29.7 pg (ref 26.0–34.0)
MCHC: 34.4 g/dL (ref 32.0–36.0)
MCV: 86 fL (ref 80–100)
MONOS PCT: 6.2 %
Monocyte #: 0.7 x10 3/mm (ref 0.2–0.9)
NEUTROS ABS: 6.9 10*3/uL — AB (ref 1.4–6.5)
NEUTROS PCT: 60.4 %
Platelet: 276 10*3/uL (ref 150–440)
RBC: 4.64 10*6/uL (ref 3.80–5.20)
RDW: 12.8 % (ref 11.5–14.5)
WBC: 11.4 10*3/uL — ABNORMAL HIGH (ref 3.6–11.0)

## 2014-10-17 LAB — URIC ACID: URIC ACID: 4.4 mg/dL

## 2014-11-22 LAB — HEMOGLOBIN A1C: Hgb A1c MFr Bld: 5.1 % (ref 4.0–6.0)

## 2015-01-19 ENCOUNTER — Telehealth: Payer: Self-pay | Admitting: Family Medicine

## 2015-01-19 NOTE — Telephone Encounter (Signed)
Pt needs refill on Nortriptyline. Pt will be going out of town tomorrow and is completely out. Pt uses [facility] pharmacy.

## 2015-01-20 NOTE — Telephone Encounter (Signed)
Patient requesting refill. 

## 2015-01-23 ENCOUNTER — Other Ambulatory Visit: Payer: Self-pay | Admitting: Family Medicine

## 2015-01-23 MED ORDER — NORTRIPTYLINE HCL 10 MG PO CAPS: 10.0000 mg | ORAL_CAPSULE | Freq: Every day | ORAL | Status: DC

## 2015-04-12 ENCOUNTER — Encounter: Payer: Self-pay | Admitting: Family Medicine

## 2015-04-12 ENCOUNTER — Ambulatory Visit (INDEPENDENT_AMBULATORY_CARE_PROVIDER_SITE_OTHER): Payer: 59 | Admitting: Family Medicine

## 2015-04-12 VITALS — BP 126/68 | HR 81 | Temp 97.5°F | Resp 18 | Ht 62.0 in | Wt 218.7 lb

## 2015-04-12 DIAGNOSIS — J4520 Mild intermittent asthma, uncomplicated: Secondary | ICD-10-CM | POA: Insufficient documentation

## 2015-04-12 DIAGNOSIS — H532 Diplopia: Secondary | ICD-10-CM | POA: Insufficient documentation

## 2015-04-12 DIAGNOSIS — M1180 Other specified crystal arthropathies, unspecified site: Secondary | ICD-10-CM | POA: Insufficient documentation

## 2015-04-12 DIAGNOSIS — Z23 Encounter for immunization: Secondary | ICD-10-CM

## 2015-04-12 DIAGNOSIS — E785 Hyperlipidemia, unspecified: Secondary | ICD-10-CM | POA: Insufficient documentation

## 2015-04-12 DIAGNOSIS — R112 Nausea with vomiting, unspecified: Secondary | ICD-10-CM | POA: Diagnosis not present

## 2015-04-12 DIAGNOSIS — F329 Major depressive disorder, single episode, unspecified: Secondary | ICD-10-CM | POA: Insufficient documentation

## 2015-04-12 DIAGNOSIS — R519 Headache, unspecified: Secondary | ICD-10-CM

## 2015-04-12 DIAGNOSIS — G5600 Carpal tunnel syndrome, unspecified upper limb: Secondary | ICD-10-CM | POA: Insufficient documentation

## 2015-04-12 DIAGNOSIS — H2510 Age-related nuclear cataract, unspecified eye: Secondary | ICD-10-CM | POA: Insufficient documentation

## 2015-04-12 DIAGNOSIS — R51 Headache: Secondary | ICD-10-CM

## 2015-04-12 DIAGNOSIS — K648 Other hemorrhoids: Secondary | ICD-10-CM | POA: Insufficient documentation

## 2015-04-12 DIAGNOSIS — Z8679 Personal history of other diseases of the circulatory system: Secondary | ICD-10-CM | POA: Insufficient documentation

## 2015-04-12 DIAGNOSIS — F419 Anxiety disorder, unspecified: Secondary | ICD-10-CM | POA: Insufficient documentation

## 2015-04-12 DIAGNOSIS — G4733 Obstructive sleep apnea (adult) (pediatric): Secondary | ICD-10-CM | POA: Insufficient documentation

## 2015-04-12 DIAGNOSIS — E559 Vitamin D deficiency, unspecified: Secondary | ICD-10-CM | POA: Insufficient documentation

## 2015-04-12 DIAGNOSIS — G629 Polyneuropathy, unspecified: Secondary | ICD-10-CM | POA: Insufficient documentation

## 2015-04-12 DIAGNOSIS — E1140 Type 2 diabetes mellitus with diabetic neuropathy, unspecified: Secondary | ICD-10-CM | POA: Insufficient documentation

## 2015-04-12 DIAGNOSIS — D414 Neoplasm of uncertain behavior of bladder: Secondary | ICD-10-CM | POA: Insufficient documentation

## 2015-04-12 DIAGNOSIS — J3089 Other allergic rhinitis: Secondary | ICD-10-CM | POA: Insufficient documentation

## 2015-04-12 MED ORDER — TRAMADOL HCL 50 MG PO TABS: 50.0000 mg | ORAL_TABLET | Freq: Four times a day (QID) | ORAL | Status: DC | PRN

## 2015-04-12 MED ORDER — PROMETHAZINE HCL 12.5 MG PO TABS: 12.5000 mg | ORAL_TABLET | Freq: Three times a day (TID) | ORAL | Status: DC | PRN

## 2015-04-12 MED ORDER — AMOXICILLIN-POT CLAVULANATE 875-125 MG PO TABS: 1.0000 | ORAL_TABLET | Freq: Two times a day (BID) | ORAL | Status: DC

## 2015-04-12 MED ORDER — PREDNISONE 20 MG PO TABS: 20.0000 mg | ORAL_TABLET | Freq: Two times a day (BID) | ORAL | Status: DC

## 2015-04-12 NOTE — Progress Notes (Signed)
Name: Jasmine Woods   MRN: [id number]    DOB: 27-Apr-1956   Date:04/12/2015       Progress Note  Subjective  Chief Complaint  Chief Complaint  Patient presents with  . Sinusitis    onset 1 week worsening facial pressure/pain on right side face, headache and ear pain.  Pt also states she had vomiting this am    HPI  Right facial pain: she has recurrent episodes of maxillary sinusitis and recurrent headaches when reviewing previous chart. Explained that she may have atypical migraine.  She did not have symptoms of an URI.  She states she woke up one week ago with pain behind her right eye, also has pain on right maxillary sinus and pain radiates to right ear / making her right ear feel wet inside.  She denies scalp allodynia, photophobia, phonophobia, but she nausea and one episode of vomiting - that was not projectile.  She has been taking Tylenol to control symptoms and wants to sleep all the time - feeling tired. No symptoms allergies, but states has some drainage from right nostril that is small in amount and yellow in color. No fever Pain is described as a dull , sometimes throbbing sensation. No family history of migraine headaches. This is not the worse headache of her life, but is the longest, going on for one week.    Patient Active Problem List   Diagnosis Date Noted  . Anxiety and depression 04/12/2015  . Asthma, mild intermittent 04/12/2015  . Bladder polyp 04/12/2015  . Carpal tunnel syndrome 04/12/2015  . Binocular vision disorder with diplopia 04/12/2015  . Dyslipidemia 04/12/2015  . H/O: HTN (hypertension) 04/12/2015  . Hemorrhoids, internal 04/12/2015  . Neuropathy ([place]) 04/12/2015  . Nuclear sclerotic cataract 04/12/2015  . Morbid obesity ([place]) 04/12/2015  . Obstructive apnea 04/12/2015  . Allergic rhinitis 04/12/2015  . 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 Type 2 diabetes mellitus ([place]) 04/12/2015  . Vitamin D deficiency  04/12/2015  . Cephalalgia 08/16/2014  . Lump or mass in breast 06/14/2013  . Bariatric surgery status 10/12/2012    Past Surgical History  Procedure Laterality Date  . Bariatric surgery  2014  . Upper gi endoscopy    . Colonoscopy  2012  . Abdominal hysterectomy  1991  . Knee surgery Right 1979-1980    Family History  Problem Relation Age of Onset  . Diabetes Mother   . Hypertension Mother   . Muscular dystrophy Sister     Social History   Social History  . Marital Status: Married    Spouse Name: N/A  . Number of Children: N/A  . Years of Education: N/A   Occupational History  . Not on file.   Social History Main Topics  . Smoking status: Former Smoker -- 3 years  . Smokeless tobacco: Never Used  . Alcohol Use: No  . Drug Use: No  . Sexual Activity: Not on file   Other Topics Concern  . Not on file   Social History Narrative     Current outpatient prescriptions:  .  acetaminophen (TYLENOL) 500 MG tablet, Take 500 mg by mouth every 6 (six) hours as needed., Disp: , Rfl:  .  atorvastatin (LIPITOR) 40 MG tablet, , Disp: , Rfl: 1 .  b complex vitamins tablet, Take 1 tablet by mouth daily., Disp: , Rfl:  .  budesonide (RHINOCORT AQUA) 32 MCG/ACT nasal spray, Place into both nostrils daily., Disp: , Rfl:  .  cholecalciferol (VITAMIN D) 1000 UNITS tablet, Take 1,000 Units by mouth daily., Disp: , Rfl:  .  cyanocobalamin 500 MCG tablet, Take 500 mcg by mouth daily., Disp: , Rfl:  .  DULoxetine (CYMBALTA) 60 MG capsule, Take 60 mg by mouth daily., Disp: , Rfl:  .  loratadine (CLARITIN) 10 MG tablet, Take 10 mg by mouth daily., Disp: , Rfl:  .  montelukast (SINGULAIR) 10 MG tablet, , Disp: , Rfl: 0 .  Multiple Vitamins-Minerals (MULTIVITAMIN WITH MINERALS) tablet, Take 1 tablet by mouth daily., Disp: , Rfl:  .  nortriptyline (PAMELOR) 10 MG capsule, Take 1 capsule (10 mg total) by mouth at bedtime., Disp: 30 capsule, Rfl: 0  Allergies  Allergen Reactions  . Ace  Inhibitors     cough  . Amlodipine     edema  . Gabapentin     edema  . Pregabalin      ROS  Ten systems reviewed and is negative except as mentioned in HPI   Objective  Filed Vitals:   04/12/15 0855  BP: 126/68  Pulse: 81  Temp: 97.5 F (36.4 C)  TempSrc: Oral  Resp: 18  Height: 5\' 2"  (1.575 m)  Weight: 218 lb 11.2 oz (99.202 kg)  SpO2: 97%    Body mass index is 39.99 kg/(m^2).  Physical Exam  Constitutional: Patient appears well-developed  Obese No distress.  HEENT: head atraumatic, normocephalic, pupils equal and reactive to light, ears TM normal bilaterally, neck supple, throat within normal limits. Tender during palpation of right maxillary sinus. Cardiovascular: Normal rate, regular rhythm and normal heart sounds.  No murmur heard. No BLE edema. Pulmonary/Chest: Effort normal and breath sounds normal. No respiratory distress. Abdominal: Soft.  There is no tenderness. Psychiatric: Patient has a normal mood and affect. behavior is normal. Judgment and thought content normal. Neurological: AAA, no focal findings, normal sensation , normal strength upper and lower extremities  PHQ2/9: Depression screen PHQ 2/9 04/12/2015  Decreased Interest 0  Down, Depressed, Hopeless 0  PHQ - 2 Score 0    Fall Risk: Fall Risk  04/12/2015  Falls in the past year? No    Functional Status Survey: Is the patient deaf or have difficulty hearing?: No Does the patient have difficulty seeing, even when wearing glasses/contacts?: Yes (glasses) Does the patient have difficulty concentrating, remembering, or making decisions?: No Does the patient have difficulty walking or climbing stairs?: No Does the patient have difficulty dressing or bathing?: No Does the patient have difficulty doing errands alone such as visiting a doctor's office or shopping?: No   Assessment & Plan  1. Headache behind the eyes  Recurrent, discussed possibility of migraine instead of sinusitis. We  will try treating for migraine and if no resolution start antibiotics tonight. Call back letting us know how she is doing tomorrow. Go to [facility] if neuro deficit  - predniSONE (DELTASONE) 20 MG tablet; Take 1 tablet (20 mg total) by mouth 2 (two) times daily with a meal.  Dispense: 2 tablet; Refill: 0 - traMADol (ULTRAM) 50 MG tablet; Take 1 tablet (50 mg total) by mouth every 6 (six) hours as needed.  Dispense: 10 tablet; Refill: 0 - amoxicillin-clavulanate (AUGMENTIN) 875-125 MG tablet; Take 1 tablet by mouth 2 (two) times daily.  Dispense: 20 tablet; Refill: 0  2. Needs flu shot  Had it at work   3. Nausea and vomiting, vomiting of unspecified type  - promethazine (PHENERGAN) 12.5 MG tablet; Take 1 tablet (12.5 mg total) by mouth every  8 (eight) hours as needed for nausea or vomiting.  Dispense: 6 tablet; Refill: 0

## 2015-04-13 ENCOUNTER — Ambulatory Visit: Payer: Self-pay | Admitting: Family Medicine

## 2015-04-24 ENCOUNTER — Other Ambulatory Visit: Payer: Self-pay | Admitting: Family Medicine

## 2015-04-24 NOTE — Telephone Encounter (Signed)
Sent to Dr. Sowles  

## 2015-04-24 NOTE — Telephone Encounter (Signed)
Patient requesting refill. 

## 2015-05-04 ENCOUNTER — Ambulatory Visit (INDEPENDENT_AMBULATORY_CARE_PROVIDER_SITE_OTHER): Payer: 59 | Admitting: Family Medicine

## 2015-05-04 ENCOUNTER — Encounter: Payer: Self-pay | Admitting: Family Medicine

## 2015-05-04 VITALS — BP 118/86 | HR 73 | Temp 99.4°F | Resp 18 | Ht 62.0 in | Wt 218.5 lb

## 2015-05-04 DIAGNOSIS — R51 Headache: Secondary | ICD-10-CM

## 2015-05-04 DIAGNOSIS — H9201 Otalgia, right ear: Secondary | ICD-10-CM | POA: Diagnosis not present

## 2015-05-04 DIAGNOSIS — Z9884 Bariatric surgery status: Secondary | ICD-10-CM | POA: Diagnosis not present

## 2015-05-04 DIAGNOSIS — R519 Headache, unspecified: Secondary | ICD-10-CM

## 2015-05-04 MED ORDER — LORCASERIN HCL 10 MG PO TABS: 1.0000 | ORAL_TABLET | Freq: Two times a day (BID) | ORAL | Status: DC

## 2015-05-04 MED ORDER — BUTALBITAL-APAP-CAFFEINE 50-325-40 MG PO TABS: 1.0000 | ORAL_TABLET | Freq: Four times a day (QID) | ORAL | Status: DC | PRN

## 2015-05-04 NOTE — Progress Notes (Signed)
Name: Jasmine Woods   MRN: [id number]    DOB: [birth year]   Date:05/04/2015       Progress Note  Subjective  Chief Complaint  Chief Complaint  Patient presents with  . Obesity    would like to try something to lose weight  . Ear Pain    right onset 2days    HPI  Otalgia: she was seen in October ( 3 weeks ago ) with right facial pain, associated with nausea and we thought it was migraine. She did not respond to therapy, but after she took antibiotics symptoms resolved, but over the past 24 hours developed right ear pain, no facial pain, no nausea but has a headache described as a dull ache on the right side of her head and behind right eye. No cold symptoms or allergy symptoms. No fever.   Obesity: s/p bariatric surgery April 2014, she was 260 lbs at the time of surgery , she had lost 38 lbs prior to surgery and since surgery she lost down to 100 lbs, but she has been gradually gaining her weight again.  She states she has good calorie control during the day. Eats egg white with a wrap in am, smoothie with 30 gram of protein for lunch and small dinner, but gets very hungry at night and snacks before going to bed.   Patient Active Problem List   Diagnosis Date Noted  . Anxiety and depression 04/12/2015  . Asthma, mild intermittent 04/12/2015  . Bladder polyp 04/12/2015  . Carpal tunnel syndrome 04/12/2015  . Binocular vision disorder with diplopia 04/12/2015  . Dyslipidemia 04/12/2015  . H/O: HTN (hypertension) 04/12/2015  . Hemorrhoids, internal 04/12/2015  . Neuropathy ([place]) 04/12/2015  . Nuclear sclerotic cataract 04/12/2015  . Morbid obesity ([place]) 04/12/2015  . Obstructive apnea 04/12/2015  . Allergic rhinitis 04/12/2015  . 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 Type 2 diabetes mellitus ([place]) 04/12/2015  . Vitamin D deficiency 04/12/2015  . Cephalalgia 08/16/2014  . Lump or mass in breast 06/14/2013  . Bariatric surgery status 10/12/2012     Past Surgical History  Procedure Laterality Date  . Bariatric surgery  2014  . Upper gi endoscopy    . Colonoscopy  2012  . Abdominal hysterectomy  1991  . Knee surgery Right 1979-1980    Family History  Problem Relation Age of Onset  . Diabetes Mother   . Hypertension Mother   . Muscular dystrophy Sister     Social History   Social History  . Marital Status: Married    Spouse Name: N/A  . Number of Children: N/A  . Years of Education: N/A   Occupational History  . Not on file.   Social History Main Topics  . Smoking status: Former Smoker -- 3 years  . Smokeless tobacco: Never Used  . Alcohol Use: No  . Drug Use: No  . Sexual Activity: Not on file   Other Topics Concern  . Not on file   Social History Narrative     Current outpatient prescriptions:  .  acetaminophen (TYLENOL) 500 MG tablet, Take 500 mg by mouth every 6 (six) hours as needed., Disp: , Rfl:  .  atorvastatin (LIPITOR) 40 MG tablet, , Disp: , Rfl: 1 .  b complex vitamins tablet, Take 1 tablet by mouth daily., Disp: , Rfl:  .  budesonide (RHINOCORT AQUA) 32 MCG/ACT nasal spray, Place into both nostrils daily., Disp: , Rfl:  .  cholecalciferol (VITAMIN D) 1000  UNITS tablet, Take 1,000 Units by mouth daily., Disp: , Rfl:  .  cyanocobalamin 500 MCG tablet, Take 500 mcg by mouth daily., Disp: , Rfl:  .  DULoxetine (CYMBALTA) 60 MG capsule, TAKE 1 CAPSULE BY MOUTH ONCE DAILY, Disp: 90 capsule, Rfl: 0 .  loratadine (CLARITIN) 10 MG tablet, Take 10 mg by mouth daily., Disp: , Rfl:  .  Lorcaserin HCl (BELVIQ) 10 MG TABS, Take 1 tablet by mouth 2 (two) times daily., Disp: 60 tablet, Rfl: 2 .  montelukast (SINGULAIR) 10 MG tablet, , Disp: , Rfl: 0 .  Multiple Vitamins-Minerals (MULTIVITAMIN WITH MINERALS) tablet, Take 1 tablet by mouth daily., Disp: , Rfl:  .  nortriptyline (PAMELOR) 10 MG capsule, Take 1 capsule (10 mg total) by mouth at bedtime., Disp: 30 capsule, Rfl: 0 .  traMADol (ULTRAM) 50 MG  tablet, Take 1 tablet (50 mg total) by mouth every 6 (six) hours as needed., Disp: 10 tablet, Rfl: 0  Allergies  Allergen Reactions  . Ace Inhibitors     cough  . Amlodipine     edema  . Gabapentin     edema  . Pregabalin      ROS  Constitutional: Negative for fever or significant weight change.  Respiratory: Negative for cough and shortness of breath.   Cardiovascular: Negative for chest pain or palpitations.  Gastrointestinal: Negative for abdominal pain, no bowel changes.  Musculoskeletal: Negative for gait problem or joint swelling.  Skin: Negative for rash.  Neurological: Negative for dizziness , positive for  headache.  No other specific complaints in a complete review of systems (except as listed in HPI above).  Objective  Filed Vitals:   05/04/15 1607  BP: 118/86  Pulse: 73  Temp: 99.4 F (37.4 C)  TempSrc: Oral  Resp: 18  Height: 5\' 2"  (1.575 m)  Weight: 218 lb 8 oz (99.111 kg)  SpO2: 97%    Body mass index is 39.95 kg/(m^2).  Physical Exam   Constitutional: Patient appears well-developed and well-nourished. Obese  No distress.  HEENT: head atraumatic, normocephalic, pupils equal and reactive to light, ears normal TM, no pain during palpation of temporal arteries or scalp, no tenderness during palpation of sinus, neck supple, throat within normal limits Cardiovascular: Normal rate, regular rhythm and normal heart sounds.  No murmur heard. No BLE edema. Pulmonary/Chest: Effort normal and breath sounds normal. No respiratory distress. Abdominal: Soft.  There is no tenderness. Psychiatric: Patient has a normal mood and affect. behavior is normal. Judgment and thought content normal. Neuro: A/A/O , normal balance, normal heel shin, and finger nose, normal cranial nerves exam  PHQ2/9: Depression screen PHQ 2/9 04/12/2015  Decreased Interest 0  Down, Depressed, Hopeless 0  PHQ - 2 Score 0     Fall Risk: Fall Risk  04/12/2015  Falls in the past year?  No     Assessment & Plan  1. Otalgia, right  Unsure of etiology, using nasal spray, normal exam - Ambulatory referral to ENT  2. Morbid obesity, unspecified obesity type ([place])  Discussed all optiosns for weight loss medications including Belviq, Qsymia, Saxenda and Contrave. Discussed risk and benefits of each of them. Explained the need to count calories and eat more during the day to avoid eating too much after dinner, continue physical activity, discussed options and we will try Belviq  3. Bariatric surgery status  - Lorcaserin HCl (BELVIQ) 10 MG TABS; Take 1 tablet by mouth 2 (two) times daily.  Dispense: 60 tablet; Refill: 2  4. Headache behind the eyes  We will try Fioricet - butalbital-acetaminophen-caffeine (FIORICET) 50-325-40 MG tablet; Take 1-2 tablets by mouth every 6 (six) hours as needed for headache.  Dispense: 20 tablet; Refill: 0

## 2015-05-22 ENCOUNTER — Other Ambulatory Visit: Payer: Self-pay | Admitting: Otolaryngology

## 2015-05-22 DIAGNOSIS — H538 Other visual disturbances: Secondary | ICD-10-CM

## 2015-05-22 DIAGNOSIS — M792 Neuralgia and neuritis, unspecified: Secondary | ICD-10-CM

## 2015-05-22 DIAGNOSIS — H9201 Otalgia, right ear: Secondary | ICD-10-CM

## 2015-05-24 ENCOUNTER — Other Ambulatory Visit
Admission: AD | Admit: 2015-05-24 | Discharge: 2015-05-24 | Disposition: A | Discharge status: Home / Self Care | Payer: 59 | Source: Ambulatory Visit | Attending: Otolaryngology | Admitting: Otolaryngology

## 2015-05-24 DIAGNOSIS — Z01812 Encounter for preprocedural laboratory examination: Secondary | ICD-10-CM | POA: Insufficient documentation

## 2015-05-24 LAB — CREATININE, SERUM
CREATININE: 0.59 mg/dL (ref 0.44–1.00)
GFR calc Af Amer: >60 mL/min (ref >60)

## 2015-05-25 ENCOUNTER — Encounter: Payer: Self-pay | Admitting: Family Medicine

## 2015-05-25 ENCOUNTER — Ambulatory Visit
Admission: RE | Admit: 2015-05-25 | Discharge: 2015-05-25 | Disposition: A | Discharge status: Home / Self Care | Payer: 59 | Source: Ambulatory Visit | Attending: Otolaryngology | Admitting: Otolaryngology

## 2015-05-25 DIAGNOSIS — H9201 Otalgia, right ear: Secondary | ICD-10-CM | POA: Diagnosis present

## 2015-05-25 DIAGNOSIS — H538 Other visual disturbances: Secondary | ICD-10-CM | POA: Insufficient documentation

## 2015-05-25 DIAGNOSIS — M792 Neuralgia and neuritis, unspecified: Secondary | ICD-10-CM | POA: Insufficient documentation

## 2015-05-25 MED ORDER — GADOBENATE DIMEGLUMINE 529 MG/ML IV SOLN
20.0000 mL | Freq: Once | INTRAVENOUS | Status: AC | PRN
  Administered 2015-05-25: 20 mL via INTRAVENOUS

## 2015-05-28 ENCOUNTER — Ambulatory Visit
Admission: EM | Admit: 2015-05-28 | Discharge: 2015-05-28 | Disposition: A | Discharge status: Home / Self Care | Payer: 59 | Attending: Internal Medicine | Admitting: Internal Medicine

## 2015-05-28 ENCOUNTER — Encounter: Payer: Self-pay | Admitting: Gynecology

## 2015-05-28 DIAGNOSIS — J0100 Acute maxillary sinusitis, unspecified: Secondary | ICD-10-CM | POA: Diagnosis not present

## 2015-05-28 DIAGNOSIS — J40 Bronchitis, not specified as acute or chronic: Secondary | ICD-10-CM

## 2015-05-28 LAB — RAPID STREP SCREEN (MED CTR MEBANE ONLY): STREPTOCOCCUS, GROUP A SCREEN (DIRECT): NEGATIVE

## 2015-05-28 MED ORDER — GUAIFENESIN-CODEINE 100-10 MG/5ML PO SOLN: 10.0000 mL | Freq: Three times a day (TID) | ORAL | Status: DC | PRN

## 2015-05-28 MED ORDER — AMOXICILLIN-POT CLAVULANATE 875-125 MG PO TABS: 1.0000 | ORAL_TABLET | Freq: Two times a day (BID) | ORAL | Status: DC

## 2015-05-28 NOTE — ED Notes (Signed)
Patient c/o cough / sore throat and nasal congestion x 4 days.

## 2015-05-28 NOTE — ED Provider Notes (Signed)
[facility]  ____________________________________________  Time seen: Approximately 9:10 AM  I have reviewed the triage vital signs and the nursing notes.   HISTORY  Chief Complaint URI   HPI Jasmine Woods is a 58 y.o. female presents with husband at bedside for the complaints of 1 week of runny nose, nasal congestion and sore throat. Reports this is worsened over the last 4 days. Patient reports intermittent low-grade fever at home and states not over 100. Also reports that yesterday with her cough she began to notice she was having some wheezing. Does report of history of asthma and occasionally has some wheezing when she is sick. States the wheezing was resolved with her home albuterol inhalers.  Patient reports that she frequently is having thick nasal drainage. Reports sinus pressure at 3 out of 10. Reports intermittent sore throat. States cough is dry and nonproductive. Denies chest pain, shortness of breath, abdominal pain, leg pain, leg swelling, dizziness or rash. Reports continues to eat and drink fluids well but with decreased appetite.   Past Medical History  Diagnosis Date  . Hypertension   . Diabetes mellitus without complication ([place])   . Sleep apnea   . Depression   . Asthma   . HA (headache)   . Neuropathy, generalized ([place])     diabetic on feet  . Vaginal dryness, menopausal   . Anxiety   . Internal hemorrhoids   . Vitamin D deficiency   . Carpal tunnel syndrome   . Double vision     seen by Dr. 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 Obesity (BMI 30-39.9)   . Allergy   . Dyslipidemia   . Nuclear sclerotic cataract of both eyes   . Bladder polyp     Dr. Ernst Spell  . Obstructive sleep apnea on CPAP   . Frequency of urination   . Rotator cuff tendonitis 07/2009    also has impingment right shoulder pain, seen by Dr. Mack Guise  . Hx of bariatric surgery     Patient Active Problem List   Diagnosis Date Noted  . Diplopia  05/25/2015  . Anxiety and depression 04/12/2015  . Asthma, mild intermittent 04/12/2015  . Bladder polyp 04/12/2015  . Carpal tunnel syndrome 04/12/2015  . Binocular vision disorder with diplopia 04/12/2015  . Dyslipidemia 04/12/2015  . H/O: HTN (hypertension) 04/12/2015  . Hemorrhoids, internal 04/12/2015  . Neuropathy ([place]) 04/12/2015  . Nuclear sclerotic cataract 04/12/2015  . Morbid obesity ([place]) 04/12/2015  . Obstructive apnea 04/12/2015  . Allergic rhinitis 04/12/2015  . 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 Type 2 diabetes mellitus ([place]) 04/12/2015  . Vitamin D deficiency 04/12/2015  . Cephalalgia 08/16/2014  . Lump or mass in breast 06/14/2013  . Bariatric surgery status 10/12/2012    Past Surgical History  Procedure Laterality Date  . Bariatric surgery  2014  . Upper gi endoscopy    . Colonoscopy  2012  . Abdominal hysterectomy  1991  . Knee surgery Right 1979-1980    Current Outpatient Rx  Name  Route  Sig  Dispense  Refill  . acetaminophen (TYLENOL) 500 MG tablet   Oral   Take 500 mg by mouth every 6 (six) hours as needed.         Marland Kitchen atorvastatin (LIPITOR) 40 MG tablet            1   . b complex vitamins tablet   Oral   Take 1 tablet by mouth daily.         Marland Kitchen  budesonide (RHINOCORT AQUA) 32 MCG/ACT nasal spray   Each Nare   Place into both nostrils daily.         . cholecalciferol (VITAMIN D) 1000 UNITS tablet   Oral   Take 1,000 Units by mouth daily.         . cyanocobalamin 500 MCG tablet   Oral   Take 500 mcg by mouth daily.                    Marland Kitchen loratadine (CLARITIN) 10 MG tablet   Oral   Take 10 mg by mouth daily.         . Lorcaserin HCl (BELVIQ) 10 MG TABS   Oral   Take 1 tablet by mouth 2 (two) times daily.   60 tablet   2   . montelukast (SINGULAIR) 10 MG tablet            0   . Multiple Vitamins-Minerals (MULTIVITAMIN WITH MINERALS) tablet   Oral   Take 1 tablet by mouth daily.          .           .           .             Allergies Ace inhibitors; Amlodipine; Gabapentin; and Pregabalin  Family History  Problem Relation Age of Onset  . Diabetes Mother   . Hypertension Mother   . Muscular dystrophy Sister     Social History Social History  Substance Use Topics  . Smoking status: Former Smoker -- 3 years  . Smokeless tobacco: Never Used  . Alcohol Use: No    Review of Systems Constitutional: Subjective fever at home. Eyes: No visual changes. ENT: Positive runny nose, nasal congestion, sore throat intermittent cough. Cardiovascular: Denies chest pain. Respiratory: Denies shortness of breath. Gastrointestinal: No abdominal pain.  No nausea, no vomiting.  No diarrhea.  No constipation. Genitourinary: Negative for dysuria. Musculoskeletal: Negative for back pain. Skin: Negative for rash. Neurological: Negative for headaches, focal weakness or numbness.  10-point ROS otherwise negative.  ____________________________________________   PHYSICAL EXAM:  VITAL SIGNS: ED Triage Vitals  Enc Vitals Group     BP 05/28/15 0856 138/70 mmHg     Pulse Rate 05/28/15 0856 63     Resp -- 18     Temp 05/28/15 0856 99.1 F (37.3 C)     Temp Source 05/28/15 0856 Tympanic     SpO2 05/28/15 0856 100 %     Weight 05/28/15 0856 224 lb (101.606 kg)     Height 05/28/15 0856 5\' 2"  (1.575 m)     Head Cir --      Peak Flow --      Pain Score 05/28/15 0856 4     Pain Loc --      Pain Edu? --      Excl. in [place]? --     Constitutional: Alert and oriented. Well appearing and in no acute distress. Eyes: Conjunctivae are normal. PERRL. EOMI. Head: Atraumatic. Mild to moderate tenderness to palpation bilateral maxillary sinuses. Mild tenderness to palpation bilateral frontal sinuses. No swelling. No erythema.  Ears: no erythema, normal TMs bilaterally.   Nose: Nasal congestion with bilateral nasal turbinate erythema.  Mouth/Throat: Mucous membranes are moist.  Mild  pharyngeal erythema, no tonsillar swelling or exudate. No uvular shift or deviation. Neck: No stridor.  No cervical spine tenderness to palpation. Hematological/Lymphatic/Immunilogical: No cervical lymphadenopathy. Cardiovascular: Normal  rate, regular rhythm. Grossly normal heart sounds.  Good peripheral circulation. Respiratory: Normal respiratory effort.  No retractions. Lungs CTAB. No wheezes, rales or rhonchi. Dry intermittent cough in room. Good air movement. Gastrointestinal: Soft and nontender. Obese abdomen. Normal Bowel sounds.   Musculoskeletal: No lower or upper extremity tenderness nor edema. Bilateral pedal pulses equal and easily palpated.  Neurologic:  Normal speech and language. No gross focal neurologic deficits are appreciated. No gait instability. Skin:  Skin is warm, dry and intact. No rash noted. Psychiatric: Mood and affect are normal. Speech and behavior are normal.  ____________________________________________   LABS (all labs ordered are listed, but only abnormal results are displayed)  Labs Reviewed  RAPID STREP SCREEN (NOT AT [facility])  CULTURE, GROUP A STREP ([place])    INITIAL IMPRESSION / ASSESSMENT AND PLAN / ED COURSE  Pertinent labs & imaging results that were available during my care of the patient were reviewed by me and considered in my medical decision making (see chart for details).  Well-appearing patient. No acute distress. Presents for the complaints of 1 week of runny nose, nasal congestion, sore throat and sinus pressure. Reports intermittent cough. Reports intermittent wheezing at home which is resolved with home albuterol treatments. Lungs clear throughout. Moderate maxillary sinus tenderness patient with nasal congestion and nasal turbinate erythema. Suspect maxillary sinusitis.Quick strep negative, will culture. Will treat supportively with guaifenesin codeine when necessary, oral Augmentin, and encourage fluids and rest.  Discussed follow up  with Primary care physician this week. Discussed follow up and return parameters including no resolution or any worsening concerns. Patient verbalized understanding and agreed to plan.   ____________________________________________   FINAL CLINICAL IMPRESSION(S) / ED DIAGNOSES  Final diagnoses:  Acute maxillary sinusitis, recurrence not specified  Bronchitis       Marylene Land, NP 05/28/15 [phone]

## 2015-05-28 NOTE — Discharge Instructions (Signed)
Take medication as prescribed. Rest. Drink plenty of fluids.   Follow up with your primary care physician this week as needed. Return to Urgent care for new or worsening concerns.   Sinusitis, Adult Sinusitis is redness, soreness, and inflammation of the paranasal sinuses. Paranasal sinuses are air pockets within the bones of your face. They are located beneath your eyes, in the middle of your forehead, and above your eyes. In healthy paranasal sinuses, mucus is able to drain out, and air is able to circulate through them by way of your nose. However, when your paranasal sinuses are inflamed, mucus and air can become trapped. This can allow bacteria and other germs to grow and cause infection. Sinusitis can develop quickly and last only a short time (acute) or continue over a long period (chronic). Sinusitis that lasts for more than 12 weeks is considered chronic. CAUSES Causes of sinusitis include:  Allergies.  Structural abnormalities, such as displacement of the cartilage that separates your nostrils (deviated septum), which can decrease the air flow through your nose and sinuses and affect sinus drainage.  Functional abnormalities, such as when the small hairs (cilia) that line your sinuses and help remove mucus do not work properly or are not present. SIGNS AND SYMPTOMS Symptoms of acute and chronic sinusitis are the same. The primary symptoms are pain and pressure around the affected sinuses. Other symptoms include:  Upper toothache.  Earache.  Headache.  Bad breath.  Decreased sense of smell and taste.  A cough, which worsens when you are lying flat.  Fatigue.  Fever.  Thick drainage from your nose, which often is green and may contain pus (purulent).  Swelling and warmth over the affected sinuses. DIAGNOSIS Your health care provider will perform a physical exam. During your exam, your health care provider may perform any of the following to help determine if you have  acute sinusitis or chronic sinusitis:  Look in your nose for signs of abnormal growths in your nostrils (nasal polyps).  Tap over the affected sinus to check for signs of infection.  View the inside of your sinuses using an imaging device that has a light attached (endoscope). If your health care provider suspects that you have chronic sinusitis, one or more of the following tests may be recommended:  Allergy tests.  Nasal culture. A sample of mucus is taken from your nose, sent to a lab, and screened for bacteria.  Nasal cytology. A sample of mucus is taken from your nose and examined by your health care provider to determine if your sinusitis is related to an allergy. TREATMENT Most cases of acute sinusitis are related to a viral infection and will resolve on their own within 10 days. Sometimes, medicines are prescribed to help relieve symptoms of both acute and chronic sinusitis. These may include pain medicines, decongestants, nasal steroid sprays, or saline sprays. However, for sinusitis related to a bacterial infection, your health care provider will prescribe antibiotic medicines. These are medicines that will help kill the bacteria causing the infection. Rarely, sinusitis is caused by a fungal infection. In these cases, your health care provider will prescribe antifungal medicine. For some cases of chronic sinusitis, surgery is needed. Generally, these are cases in which sinusitis recurs more than 3 times per year, despite other treatments. HOME CARE INSTRUCTIONS  Drink plenty of water. Water helps thin the mucus so your sinuses can drain more easily.  Use a humidifier.  Inhale steam 3-4 times a day (for example, sit in the  bathroom with the shower running).  Apply a warm, moist washcloth to your face 3-4 times a day, or as directed by your health care provider.  Use saline nasal sprays to help moisten and clean your sinuses.  Take medicines only as directed by your health care  provider.  If you were prescribed either an antibiotic or antifungal medicine, finish it all even if you start to feel better. SEEK IMMEDIATE MEDICAL CARE IF:  You have increasing pain or severe headaches.  You have nausea, vomiting, or drowsiness.  You have swelling around your face.  You have vision problems.  You have a stiff neck.  You have difficulty breathing.   This information is not intended to replace advice given to you by your health care provider. Make sure you discuss any questions you have with your health care provider.   Document Released: 06/10/2005 Document Revised: 07/01/2014 Document Reviewed: 06/25/2011 Elsevier Interactive Patient Education [employer].

## 2015-05-29 DIAGNOSIS — G44221 Chronic tension-type headache, intractable: Secondary | ICD-10-CM | POA: Insufficient documentation

## 2015-05-30 ENCOUNTER — Ambulatory Visit (INDEPENDENT_AMBULATORY_CARE_PROVIDER_SITE_OTHER): Payer: 59 | Admitting: Family Medicine

## 2015-05-30 ENCOUNTER — Ambulatory Visit: Payer: 59 | Admitting: Family Medicine

## 2015-05-30 ENCOUNTER — Encounter: Payer: Self-pay | Admitting: Family Medicine

## 2015-05-30 VITALS — BP 132/76 | HR 72 | Temp 97.9°F | Resp 18 | Wt 220.6 lb

## 2015-05-30 DIAGNOSIS — R6889 Other general symptoms and signs: Secondary | ICD-10-CM | POA: Diagnosis not present

## 2015-05-30 DIAGNOSIS — J4521 Mild intermittent asthma with (acute) exacerbation: Secondary | ICD-10-CM

## 2015-05-30 LAB — CULTURE, GROUP A STREP (THRC)

## 2015-05-30 MED ORDER — PREDNISONE 20 MG PO TABS: 20.0000 mg | ORAL_TABLET | Freq: Every day | ORAL | Status: DC

## 2015-05-30 NOTE — ED Notes (Signed)
Final report of strep testing negative  

## 2015-05-30 NOTE — Progress Notes (Signed)
Name: Jasmine Woods   MRN: [id number]    DOB: [birth year]   Date:05/30/2015       Progress Note  Subjective  Chief Complaint  Chief Complaint  Patient presents with  . URI    onset 6 days syptoms include: fever,scratchy throat, cough, congestion, headache.  Went to urgent care sunday was diagnosed with sinusitis and bronchitis    HPI  Flu like symptoms: developed acute symptoms of fever, sore throat, cough, nasal congestion, rhinorrhea. She went to Urgent care, had strep test that was negative. She was given cough medication and an antibiotic to fill if no improvement. She started Augmentin on Monday. Fever is going down , still feels very tired, and had decrease in appetite.   Asthma mild intermittent with exacerbation: she states since flu like symptoms, she has severe cough, and wheezing, also SOB with activity. She has not energy. She wakes up during the night coughing. She has increased the use of rescue inhaler.    Patient Active Problem List   Diagnosis Date Noted  . Chronic tension-type headache, intractable 05/29/2015  . Diplopia 05/25/2015  . Anxiety and depression 04/12/2015  . Asthma, mild intermittent 04/12/2015  . Bladder polyp 04/12/2015  . Carpal tunnel syndrome 04/12/2015  . Binocular vision disorder with diplopia 04/12/2015  . Dyslipidemia 04/12/2015  . H/O: HTN (hypertension) 04/12/2015  . Hemorrhoids, internal 04/12/2015  . Neuropathy ([place]) 04/12/2015  . Nuclear sclerotic cataract 04/12/2015  . Morbid obesity ([place]) 04/12/2015  . Obstructive apnea 04/12/2015  . Allergic rhinitis 04/12/2015  . 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 Type 2 diabetes mellitus ([place]) 04/12/2015  . Vitamin D deficiency 04/12/2015  . Cephalalgia 08/16/2014  . Lump or mass in breast 06/14/2013  . Bariatric surgery status 10/12/2012    Past Surgical History  Procedure Laterality Date  . Bariatric surgery  2014  . Upper gi endoscopy    .  Colonoscopy  2012  . Abdominal hysterectomy  1991  . Knee surgery Right 1979-1980    Family History  Problem Relation Age of Onset  . Diabetes Mother   . Hypertension Mother   . Muscular dystrophy Sister     Social History   Social History  . Marital Status: Married    Spouse Name: N/A  . Number of Children: N/A  . Years of Education: N/A   Occupational History  . Not on file.   Social History Main Topics  . Smoking status: Former Smoker -- 3 years  . Smokeless tobacco: Never Used  . Alcohol Use: No  . Drug Use: No  . Sexual Activity: Not on file   Other Topics Concern  . Not on file   Social History Narrative     Current outpatient prescriptions:  .  topiramate (TOPAMAX) 50 MG tablet, Take 50 mg by mouth 2 (two) times daily., Disp: , Rfl:  .  acetaminophen (TYLENOL) 500 MG tablet, Take 500 mg by mouth every 6 (six) hours as needed., Disp: , Rfl:  .  amoxicillin-clavulanate (AUGMENTIN) 875-125 MG tablet, Take 1 tablet by mouth every 12 (twelve) hours., Disp: 20 tablet, Rfl: 0 .  atorvastatin (LIPITOR) 40 MG tablet, , Disp: , Rfl: 1 .  b complex vitamins tablet, Take 1 tablet by mouth daily., Disp: , Rfl:  .  budesonide (RHINOCORT AQUA) 32 MCG/ACT nasal spray, Place into both nostrils daily., Disp: , Rfl:  .  butalbital-acetaminophen-caffeine (FIORICET) 50-325-40 MG tablet, Take 1-2 tablets by mouth every 6 (six) hours  as needed for headache., Disp: 20 tablet, Rfl: 0 .  cholecalciferol (VITAMIN D) 1000 UNITS tablet, Take 1,000 Units by mouth daily., Disp: , Rfl:  .  cyanocobalamin 500 MCG tablet, Take 500 mcg by mouth daily., Disp: , Rfl:  .  DULoxetine (CYMBALTA) 60 MG capsule, TAKE 1 CAPSULE BY MOUTH ONCE DAILY, Disp: 90 capsule, Rfl: 0 .  guaiFENesin-codeine 100-10 MG/5ML syrup, Take 10 mLs by mouth 3 (three) times daily as needed for cough., Disp: 125 mL, Rfl: 0 .  loratadine (CLARITIN) 10 MG tablet, Take 10 mg by mouth daily., Disp: , Rfl:  .  Lorcaserin HCl  (BELVIQ) 10 MG TABS, Take 1 tablet by mouth 2 (two) times daily., Disp: 60 tablet, Rfl: 2 .  montelukast (SINGULAIR) 10 MG tablet, , Disp: , Rfl: 0 .  Multiple Vitamins-Minerals (MULTIVITAMIN WITH MINERALS) tablet, Take 1 tablet by mouth daily., Disp: , Rfl:  .  nortriptyline (PAMELOR) 10 MG capsule, Take 1 capsule (10 mg total) by mouth at bedtime., Disp: 30 capsule, Rfl: 0 .  predniSONE (DELTASONE) 20 MG tablet, Take 1 tablet (20 mg total) by mouth daily with breakfast., Disp: 10 tablet, Rfl: 0 .  traMADol (ULTRAM) 50 MG tablet, Take 1 tablet (50 mg total) by mouth every 6 (six) hours as needed., Disp: 10 tablet, Rfl: 0  Allergies  Allergen Reactions  . Ace Inhibitors     cough  . Amlodipine     edema  . Gabapentin     edema  . Pregabalin      ROS  Ten systems reviewed and is negative except as mentioned in HPI   Objective  Filed Vitals:   05/30/15 1506  BP: 132/76  Pulse: 72  Temp: 97.9 F (36.6 C)  TempSrc: Oral  Resp: 18  Weight: 220 lb 9.6 oz (100.064 kg)  SpO2: 97%    Body mass index is 40.34 kg/(m^2).  Physical Exam  Constitutional: Patient appears well-developed and well-nourished. Obese  No distress.  HEENT: head atraumatic, normocephalic, pupils equal and reactive to light, ears TM normal bilateral  neck supple, throat within normal limits Cardiovascular: Normal rate, regular rhythm and normal heart sounds.  No murmur heard. No BLE edema. Pulmonary/Chest: Effort normal , rhonchi and end expiratory wheezing. No respiratory distress. Abdominal: Soft.  There is no tenderness. Psychiatric: Patient has a normal mood and affect. behavior is normal. Judgment and thought content normal.  Recent Results (from the past 2160 hour(s))  Creatinine, serum     Status: None   Collection Time: 05/24/15  1:54 PM  Result Value Ref Range   Creatinine, Ser 0.59 0.44 - 1.00 mg/dL   GFR calc non Af Amer >60 >60 mL/min   GFR calc Af Amer >60 >60 mL/min    Comment:  (NOTE) The eGFR has been calculated using the CKD EPI equation. This calculation has not been validated in all clinical situations. eGFR's persistently <60 mL/min signify possible Chronic Kidney Disease.   Rapid strep screen     Status: None   Collection Time: 05/28/15  8:50 AM  Result Value Ref Range   Streptococcus, Group A Screen (Direct) NEGATIVE NEGATIVE  Culture, group A strep ([facility] only)     Status: None   Collection Time: 05/28/15  8:50 AM  Result Value Ref Range   Specimen Description THROAT    Special Requests NONE    Culture NO BETA HEMOLYTIC STREPTOCOCCI ISOLATED    Report Status 05/30/2015 FINAL     PHQ2/9: Depression screen PHQ  2/9 04/12/2015  Decreased Interest 0  Down, Depressed, Hopeless 0  PHQ - 2 Score 0    Fall Risk: Fall Risk  04/12/2015  Falls in the past year? No    Assessment & Plan  1. Asthma exacerbation attacks, mild intermittent  We will give her prednisone to take and if no improvement return for follow up. She has a lot of end expiratory wheezing and advised her to stay out of work for the next couple of days and return sooner if no improvement with medication  - predniSONE (DELTASONE) 20 MG tablet; Take 1 tablet (20 mg total) by mouth daily with breakfast.  Dispense: 10 tablet; Refill: 0  2. Flu-like symptoms  Continue cough medication, no need for Tamiflu since fever has subsided.

## 2015-06-15 ENCOUNTER — Ambulatory Visit: Payer: 59 | Admitting: Family Medicine

## 2015-07-06 ENCOUNTER — Encounter: Payer: Self-pay | Admitting: Family Medicine

## 2015-07-06 ENCOUNTER — Ambulatory Visit (INDEPENDENT_AMBULATORY_CARE_PROVIDER_SITE_OTHER): Payer: 59 | Admitting: Family Medicine

## 2015-07-06 VITALS — BP 122/78 | HR 86 | Temp 98.2°F | Resp 16 | Ht 62.0 in | Wt 219.4 lb

## 2015-07-06 DIAGNOSIS — G629 Polyneuropathy, unspecified: Secondary | ICD-10-CM | POA: Diagnosis not present

## 2015-07-06 DIAGNOSIS — J4520 Mild intermittent asthma, uncomplicated: Secondary | ICD-10-CM

## 2015-07-06 DIAGNOSIS — Z23 Encounter for immunization: Secondary | ICD-10-CM

## 2015-07-06 DIAGNOSIS — N393 Stress incontinence (female) (male): Secondary | ICD-10-CM

## 2015-07-06 DIAGNOSIS — Z0000 Encounter for general adult medical examination without abnormal findings: Secondary | ICD-10-CM

## 2015-07-06 DIAGNOSIS — J3089 Other allergic rhinitis: Secondary | ICD-10-CM

## 2015-07-06 DIAGNOSIS — E559 Vitamin D deficiency, unspecified: Secondary | ICD-10-CM | POA: Diagnosis not present

## 2015-07-06 DIAGNOSIS — Z9884 Bariatric surgery status: Secondary | ICD-10-CM | POA: Diagnosis not present

## 2015-07-06 DIAGNOSIS — J309 Allergic rhinitis, unspecified: Secondary | ICD-10-CM

## 2015-07-06 DIAGNOSIS — E1140 Type 2 diabetes mellitus with diabetic neuropathy, unspecified: Secondary | ICD-10-CM

## 2015-07-06 DIAGNOSIS — Z01419 Encounter for gynecological examination (general) (routine) without abnormal findings: Secondary | ICD-10-CM

## 2015-07-06 DIAGNOSIS — Z79899 Other long term (current) drug therapy: Secondary | ICD-10-CM

## 2015-07-06 DIAGNOSIS — E785 Hyperlipidemia, unspecified: Secondary | ICD-10-CM

## 2015-07-06 DIAGNOSIS — L409 Psoriasis, unspecified: Secondary | ICD-10-CM | POA: Diagnosis not present

## 2015-07-06 DIAGNOSIS — Z1239 Encounter for other screening for malignant neoplasm of breast: Secondary | ICD-10-CM | POA: Diagnosis not present

## 2015-07-06 MED ORDER — NORTRIPTYLINE HCL 10 MG PO CAPS: 10.0000 mg | ORAL_CAPSULE | Freq: Every day | ORAL | Status: DC

## 2015-07-06 MED ORDER — ASPIRIN 81 MG PO CHEW: 75.0000 mg | CHEWABLE_TABLET | Freq: Every day | ORAL | Status: DC

## 2015-07-06 MED ORDER — ATORVASTATIN CALCIUM 20 MG PO TABS: 20.0000 mg | ORAL_TABLET | Freq: Every day | ORAL | Status: DC

## 2015-07-06 MED ORDER — DULOXETINE HCL 60 MG PO CPEP: 60.0000 mg | ORAL_CAPSULE | Freq: Every day | ORAL | Status: DC

## 2015-07-06 MED ORDER — MONTELUKAST SODIUM 10 MG PO TABS: 10.0000 mg | ORAL_TABLET | Freq: Every day | ORAL | Status: DC

## 2015-07-06 MED ORDER — BUDESONIDE 32 MCG/ACT NA SUSP: 2.0000 | Freq: Every day | NASAL | Status: DC

## 2015-07-06 MED ORDER — ALBUTEROL SULFATE (2.5 MG/3ML) 0.083% IN NEBU: 2.5000 mg | INHALATION_SOLUTION | Freq: Four times a day (QID) | RESPIRATORY_TRACT | Status: DC | PRN

## 2015-07-06 NOTE — Progress Notes (Signed)
Name: Jasmine Woods   MRN: [id number]    DOB: 1956/02/20   Date:07/06/2015       Progress Note  Subjective  Chief Complaint  Chief Complaint  Patient presents with  . Annual Exam    no pap    HPI  Well Woman: she has bene married for many years, not sexually active - her husband has multiple medical problems, no vaginal discharge, no dysuria, she has symptoms of stress incontinence - intermittently - but she does not want therapy at this time. History of hysterectomy for benign reasons  DMII with neuropathy: on diet only since bariatric surgery in 2014, she states she is up to date with eye exam, her peripheral neuropathy is controlled, pain of 2/10 with Pamelor and Cymbalta. No polyphagia, polydipsia or polyuria  Obesity:s/p bariatric surgery, weight went down to 198 lbs and is now up 219lbs.  She is still about 80 lbs lower than before surgery. She needs to resume physical activity.   Hyperlipidemia: currently on 20 mg of Lipitor, denies myalgia. No chest pain or palpitation.   Asthma: she has noticed some cough since recent bronchitis, but no SOB or wheezing.    Patient Active Problem List   Diagnosis Date Noted  . Chronic tension-type headache, intractable 05/29/2015  . Diplopia 05/25/2015  . Anxiety and depression 04/12/2015  . Asthma, mild intermittent 04/12/2015  . Bladder polyp 04/12/2015  . Carpal tunnel syndrome 04/12/2015  . Binocular vision disorder with diplopia 04/12/2015  . Dyslipidemia 04/12/2015  . H/O: HTN (hypertension) 04/12/2015  . Hemorrhoids, internal 04/12/2015  . Neuropathy ([place]) 04/12/2015  . Nuclear sclerotic cataract 04/12/2015  . Morbid obesity ([place]) 04/12/2015  . Obstructive apnea 04/12/2015  . Perennial allergic rhinitis 04/12/2015  . 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 Type 2 diabetes, controlled, with neuropathy ([place]) 04/12/2015  . Vitamin D deficiency 04/12/2015  . Cephalalgia 08/16/2014  . Lump or  mass in breast 06/14/2013  . Bariatric surgery status 10/12/2012    Past Surgical History  Procedure Laterality Date  . Bariatric surgery  2014  . Upper gi endoscopy    . Colonoscopy  2012  . Abdominal hysterectomy  1991  . Knee surgery Right 1979-1980    Family History  Problem Relation Age of Onset  . Diabetes Mother   . Hypertension Mother   . Muscular dystrophy Sister     Social History   Social History  . Marital Status: Married    Spouse Name: N/A  . Number of Children: N/A  . Years of Education: N/A   Occupational History  . Not on file.   Social History Main Topics  . Smoking status: Former Smoker -- 3 years  . Smokeless tobacco: Never Used  . Alcohol Use: No  . Drug Use: No  . Sexual Activity: Not on file   Other Topics Concern  . Not on file   Social History Narrative     Current outpatient prescriptions:  .  b complex vitamins tablet, Take 1 tablet by mouth daily., Disp: , Rfl:  .  budesonide (RHINOCORT AQUA) 32 MCG/ACT nasal spray, Place 2 sprays into both nostrils daily., Disp: 1 Bottle, Rfl: 5 .  cholecalciferol (VITAMIN D) 1000 UNITS tablet, Take 1,000 Units by mouth daily., Disp: , Rfl:  .  cyanocobalamin 500 MCG tablet, Take 500 mcg by mouth daily., Disp: , Rfl:  .  DULoxetine (CYMBALTA) 60 MG capsule, Take 1 capsule (60 mg total) by mouth daily., Disp: 90  capsule, Rfl: 1 .  loratadine (CLARITIN) 10 MG tablet, Take 10 mg by mouth daily., Disp: , Rfl:  .  montelukast (SINGULAIR) 10 MG tablet, Take 1 tablet (10 mg total) by mouth at bedtime., Disp: 90 tablet, Rfl: 1 .  Multiple Vitamins-Minerals (MULTIVITAMIN WITH MINERALS) tablet, Take 1 tablet by mouth daily., Disp: , Rfl:  .  nortriptyline (PAMELOR) 10 MG capsule, Take 1 capsule (10 mg total) by mouth at bedtime., Disp: 90 capsule, Rfl: 1 .  topiramate (TOPAMAX) 50 MG tablet, Take 50 mg by mouth 2 (two) times daily., Disp: , Rfl:  .  acetaminophen (TYLENOL) 500 MG tablet, Take 500 mg by mouth  every 6 (six) hours as needed. Reported on 07/06/2015, Disp: , Rfl:  .  atorvastatin (LIPITOR) 20 MG tablet, Take 1 tablet (20 mg total) by mouth daily., Disp: 90 tablet, Rfl: 1 .  Lorcaserin HCl (BELVIQ) 10 MG TABS, Take 1 tablet by mouth 2 (two) times daily. (Patient not taking: Reported on 07/06/2015), Disp: 60 tablet, Rfl: 2  Allergies  Allergen Reactions  . Ace Inhibitors     cough  . Amlodipine     edema  . Gabapentin     edema  . Pregabalin      ROS  Constitutional: Negative for fever or significant weight change.  Respiratory: Positive  for cough no  shortness of breath.   Cardiovascular: Negative for chest pain or palpitations.  Gastrointestinal: Negative for abdominal pain, no bowel changes.  Musculoskeletal: Negative for gait problem or joint swelling.  Skin: Negative for rash.  Neurological: Negative for dizziness or headache.  No other specific complaints in a complete review of systems (except as listed in HPI above).  Objective  Filed Vitals:   07/06/15 0830  BP: 122/78  Pulse: 86  Temp: 98.2 F (36.8 C)  TempSrc: Oral  Resp: 16  Height: '5\' 2"'  (1.575 m)  Weight: 219 lb 6.4 oz (99.519 kg)  SpO2: 97%    Body mass index is 40.12 kg/(m^2).  Physical Exam  Constitutional: Patient appears well-developed and well-nourished, obese. No distress.  HENT: Head: Normocephalic and atraumatic. Ears: B TMs ok, no erythema or effusion; Nose: Nose normal. Mouth/Throat: Oropharynx is clear and moist. No oropharyngeal exudate.  Eyes: Conjunctivae and EOM are normal. Pupils are equal, round, and reactive to light. No scleral icterus.  Neck: Normal range of motion. Neck supple. No JVD present. No thyromegaly present.  Cardiovascular: Normal rate, regular rhythm and normal heart sounds.  No murmur heard. No BLE edema. Pulmonary/Chest: Effort normal and breath sounds normal. No respiratory distress. Abdominal: Soft. Bowel sounds are normal, no distension. There is no  tenderness. no masses Breast: no lumps or masses, no nipple discharge or rashes FEMALE GENITALIA:  External genitalia normal External urethra normal RECTAL: not done  Musculoskeletal: Normal range of motion, no joint effusions. No gross deformities Neurological: he is alert and oriented to person, place, and time. No cranial nerve deficit. Coordination, balance, strength, speech and gait are normal.  Skin: Skin is warm and dry. Psoriatic plaques on elbows. No erythema.  Psychiatric: Patient has a normal mood and affect. behavior is normal. Judgment and thought content normal.  Recent Results (from the past 2160 hour(s))  Creatinine, serum     Status: None   Collection Time: 05/24/15  1:54 PM  Result Value Ref Range   Creatinine, Ser 0.59 0.44 - 1.00 mg/dL   GFR calc non Af Amer >60 >60 mL/min   GFR calc Af Amer >  60 >60 mL/min    Comment: (NOTE) The eGFR has been calculated using the CKD EPI equation. This calculation has not been validated in all clinical situations. eGFR's persistently <60 mL/min signify possible Chronic Kidney Disease.   Rapid strep screen     Status: None   Collection Time: 05/28/15  8:50 AM  Result Value Ref Range   Streptococcus, Group A Screen (Direct) NEGATIVE NEGATIVE  Culture, group A strep ([facility] only)     Status: None   Collection Time: 05/28/15  8:50 AM  Result Value Ref Range   Specimen Description THROAT    Special Requests NONE    Culture NO BETA HEMOLYTIC STREPTOCOCCI ISOLATED    Report Status 05/30/2015 FINAL     Diabetic Foot Exam: Diabetic Foot Exam - Simple   Simple Foot Form  Diabetic Foot exam was performed with the following findings:  Yes 07/06/2015  8:59 AM  Visual Inspection  No deformities, no ulcerations, no other skin breakdown bilaterally:  Yes  Sensation Testing  Intact to touch and monofilament testing bilaterally:  Yes  Pulse Check  Posterior Tibialis and Dorsalis pulse intact bilaterally:  Yes  Comments        PHQ2/9: Depression screen [facility] 2/9 07/06/2015 04/12/2015  Decreased Interest 0 0  Down, Depressed, Hopeless 0 0  PHQ - 2 Score 0 0     Fall Risk: Fall Risk  07/06/2015 04/12/2015  Falls in the past year? No No      Functional Status Survey: Is the patient deaf or have difficulty hearing?: No Does the patient have difficulty seeing, even when wearing glasses/contacts?: Yes (patient has double vision and is being treated at [facility]) Does the patient have difficulty concentrating, remembering, or making decisions?: No Does the patient have difficulty walking or climbing stairs?: No Does the patient have difficulty dressing or bathing?: No Does the patient have difficulty doing errands alone such as visiting a doctor's office or shopping?: No    Assessment & Plan  1. Well woman exam  Discussed importance of 150 minutes of physical activity weekly, eat two servings of fish weekly, eat one serving of tree nuts ( cashews, pistachios, pecans, almonds.Marland Kitchen) every other day, eat 6 servings of fruit/vegetables daily and drink plenty of water and avoid sweet beverages.   2. Type 2 diabetes, controlled, with neuropathy (HCC)  - nortriptyline (PAMELOR) 10 MG capsule; Take 1 capsule (10 mg total) by mouth at bedtime.  Dispense: 90 capsule; Refill: 1 - DULoxetine (CYMBALTA) 60 MG capsule; Take 1 capsule (60 mg total) by mouth daily.  Dispense: 90 capsule; Refill: 1 - POCT UA - Microalbumin - Hemoglobin A1c  3. Dyslipidemia  - atorvastatin (LIPITOR) 20 MG tablet; Take 1 tablet (20 mg total) by mouth daily.  Dispense: 90 tablet; Refill: 1 - Lipid panel  4. Asthma, mild intermittent, uncomplicated  - montelukast (SINGULAIR) 10 MG tablet; Take 1 tablet (10 mg total) by mouth at bedtime.  Dispense: 90 tablet; Refill: 1  5. Perennial allergic rhinitis  - budesonide (RHINOCORT AQUA) 32 MCG/ACT nasal spray; Place 2 sprays into both nostrils daily.  Dispense: 1 Bottle; Refill:  5 - montelukast (SINGULAIR) 10 MG tablet; Take 1 tablet (10 mg total) by mouth at bedtime.  Dispense: 90 tablet; Refill: 1  6. Breast cancer screening  - MM Digital Screening; Future  7. Vitamin D deficiency  - VITAMIN D 25 Hydroxy (Vit-D Deficiency, Fractures)  8. Need for pneumococcal vaccination  - Pneumococcal conjugate vaccine 13-valent IM  9. Long-term use of high-risk medication  - Comprehensive metabolic panel  10. Bariatric surgery status  - Vitamin B12 - Ferritin - CBC with Differential/Platelet  11. Peripheral polyneuropathy (HCC)  - nortriptyline (PAMELOR) 10 MG capsule; Take 1 capsule (10 mg total) by mouth at bedtime.  Dispense: 90 capsule; Refill: 1 - DULoxetine (CYMBALTA) 60 MG capsule; Take 1 capsule (60 mg total) by mouth daily.  Dispense: 90 capsule; Refill: 1

## 2015-07-14 ENCOUNTER — Encounter: Payer: Self-pay | Admitting: Family Medicine

## 2015-07-31 DIAGNOSIS — G7000 Myasthenia gravis without (acute) exacerbation: Secondary | ICD-10-CM | POA: Diagnosis not present

## 2015-08-08 ENCOUNTER — Ambulatory Visit
Admission: RE | Admit: 2015-08-08 | Discharge: 2015-08-08 | Disposition: A | Discharge status: Home / Self Care | Payer: 59 | Source: Ambulatory Visit | Attending: Family Medicine | Admitting: Family Medicine

## 2015-08-08 DIAGNOSIS — Z1231 Encounter for screening mammogram for malignant neoplasm of breast: Secondary | ICD-10-CM | POA: Diagnosis not present

## 2015-08-08 DIAGNOSIS — G7000 Myasthenia gravis without (acute) exacerbation: Secondary | ICD-10-CM | POA: Insufficient documentation

## 2015-08-08 DIAGNOSIS — Z1239 Encounter for other screening for malignant neoplasm of breast: Secondary | ICD-10-CM

## 2015-08-16 DIAGNOSIS — Z79899 Other long term (current) drug therapy: Secondary | ICD-10-CM | POA: Diagnosis not present

## 2015-08-16 DIAGNOSIS — E559 Vitamin D deficiency, unspecified: Secondary | ICD-10-CM | POA: Diagnosis not present

## 2015-08-16 DIAGNOSIS — Z9884 Bariatric surgery status: Secondary | ICD-10-CM | POA: Diagnosis not present

## 2015-08-16 DIAGNOSIS — E1140 Type 2 diabetes mellitus with diabetic neuropathy, unspecified: Secondary | ICD-10-CM | POA: Diagnosis not present

## 2015-08-16 DIAGNOSIS — E785 Hyperlipidemia, unspecified: Secondary | ICD-10-CM | POA: Diagnosis not present

## 2015-08-17 LAB — COMPREHENSIVE METABOLIC PANEL
A/G RATIO: 1.6 (ref 1.1–2.5)
ALK PHOS: 42 IU/L (ref 39–117)
ALT: 33 IU/L — AB (ref 0–32)
AST: 25 IU/L (ref 0–40)
Albumin: 4.2 g/dL (ref 3.6–4.8)
BUN/Creatinine Ratio: 33 — ABNORMAL HIGH (ref 11–26)
BUN: 19 mg/dL (ref 8–27)
Bilirubin Total: 0.2 mg/dL (ref 0.0–1.2)
CO2: 21 mmol/L (ref 18–29)
Calcium: 9.3 mg/dL (ref 8.7–10.3)
Chloride: 102 mmol/L (ref 96–106)
Creatinine, Ser: 0.57 mg/dL (ref 0.57–1.00)
GFR calc Af Amer: 117 mL/min/{1.73_m2} (ref >59)
GFR calc non Af Amer: 101 mL/min/{1.73_m2} (ref >59)
GLOBULIN, TOTAL: 2.6 g/dL (ref 1.5–4.5)
Glucose: 84 mg/dL (ref 65–99)
POTASSIUM: 4.1 mmol/L (ref 3.5–5.2)
SODIUM: 140 mmol/L (ref 134–144)
Total Protein: 6.8 g/dL (ref 6.0–8.5)

## 2015-08-17 LAB — CBC WITH DIFFERENTIAL/PLATELET
Basophils Absolute: 0 10*3/uL (ref 0.0–0.2)
Basos: 0 %
EOS (ABSOLUTE): 0.2 10*3/uL (ref 0.0–0.4)
EOS: 2 %
HEMATOCRIT: 40.6 % (ref 34.0–46.6)
HEMOGLOBIN: 13.5 g/dL (ref 11.1–15.9)
IMMATURE GRANS (ABS): 0 10*3/uL (ref 0.0–0.1)
IMMATURE GRANULOCYTES: 0 %
LYMPHS: 48 %
Lymphocytes Absolute: 3.9 10*3/uL — ABNORMAL HIGH (ref 0.7–3.1)
MCH: 28.8 pg (ref 26.6–33.0)
MCHC: 33.3 g/dL (ref 31.5–35.7)
MCV: 87 fL (ref 79–97)
MONOCYTES: 8 %
Monocytes Absolute: 0.6 10*3/uL (ref 0.1–0.9)
NEUTROS PCT: 42 %
Neutrophils Absolute: 3.4 10*3/uL (ref 1.4–7.0)
Platelets: 266 10*3/uL (ref 150–379)
RBC: 4.69 x10E6/uL (ref 3.77–5.28)
RDW: 13.2 % (ref 12.3–15.4)
WBC: 8.2 10*3/uL (ref 3.4–10.8)

## 2015-08-17 LAB — LIPID PANEL
CHOLESTEROL TOTAL: 184 mg/dL (ref 100–199)
Chol/HDL Ratio: 3 ratio units (ref 0.0–4.4)
HDL: 61 mg/dL (ref >39)
LDL Calculated: 101 mg/dL — ABNORMAL HIGH (ref 0–99)
TRIGLYCERIDES: 108 mg/dL (ref 0–149)
VLDL Cholesterol Cal: 22 mg/dL (ref 5–40)

## 2015-08-17 LAB — FERRITIN: Ferritin: 79 ng/mL (ref 15–150)

## 2015-08-17 LAB — VITAMIN B12: Vitamin B-12: 1784 pg/mL — ABNORMAL HIGH (ref 211–946)

## 2015-08-17 LAB — HEMOGLOBIN A1C
ESTIMATED AVERAGE GLUCOSE: 114 mg/dL
HEMOGLOBIN A1C: 5.6 % (ref 4.8–5.6)

## 2015-08-17 LAB — VITAMIN D 25 HYDROXY (VIT D DEFICIENCY, FRACTURES): VIT D 25 HYDROXY: 45.1 ng/mL (ref 30.0–100.0)

## 2015-08-24 ENCOUNTER — Ambulatory Visit: Payer: 59 | Admitting: Family Medicine

## 2015-08-30 ENCOUNTER — Other Ambulatory Visit: Payer: Self-pay | Admitting: Psychiatry

## 2015-08-30 DIAGNOSIS — G7000 Myasthenia gravis without (acute) exacerbation: Secondary | ICD-10-CM

## 2015-09-04 ENCOUNTER — Ambulatory Visit
Admission: RE | Admit: 2015-09-04 | Discharge: 2015-09-04 | Disposition: A | Discharge status: Home / Self Care | Payer: 59 | Source: Ambulatory Visit | Attending: Psychiatry | Admitting: Psychiatry

## 2015-09-04 DIAGNOSIS — G7000 Myasthenia gravis without (acute) exacerbation: Secondary | ICD-10-CM | POA: Insufficient documentation

## 2015-09-04 DIAGNOSIS — E0789 Other specified disorders of thyroid: Secondary | ICD-10-CM | POA: Diagnosis not present

## 2015-09-05 DIAGNOSIS — G7000 Myasthenia gravis without (acute) exacerbation: Secondary | ICD-10-CM | POA: Diagnosis not present

## 2015-09-15 ENCOUNTER — Ambulatory Visit (INDEPENDENT_AMBULATORY_CARE_PROVIDER_SITE_OTHER): Payer: 59 | Admitting: Family Medicine

## 2015-09-15 ENCOUNTER — Encounter: Payer: Self-pay | Admitting: Family Medicine

## 2015-09-15 VITALS — BP 118/76 | HR 71 | Temp 98.8°F | Resp 14 | Wt 221.1 lb

## 2015-09-15 DIAGNOSIS — J4520 Mild intermittent asthma, uncomplicated: Secondary | ICD-10-CM

## 2015-09-15 DIAGNOSIS — H532 Diplopia: Secondary | ICD-10-CM | POA: Diagnosis not present

## 2015-09-15 DIAGNOSIS — G7000 Myasthenia gravis without (acute) exacerbation: Secondary | ICD-10-CM

## 2015-09-15 DIAGNOSIS — R946 Abnormal results of thyroid function studies: Secondary | ICD-10-CM | POA: Diagnosis not present

## 2015-09-15 DIAGNOSIS — R9389 Abnormal findings on diagnostic imaging of other specified body structures: Secondary | ICD-10-CM

## 2015-09-15 NOTE — Progress Notes (Signed)
Name: Jasmine Woods   MRN: [id number]    DOB: 1955-11-28   Date:09/15/2015       Progress Note  Subjective  Chief Complaint  Chief Complaint  Patient presents with  . Asthma    spirometry    HPI  Asthma: she had an episodes of bronchitis, and the cough persisted for weeks, she is feeling well, no SOB, no wheezing not cough.   Myasthenia Gravis: diagnosed at [facility] by Dr. Burnett Harry, seronegative, only causing ptosis and double vision, no other symptoms. She had a negative chest CT for thymoma, and has started on medication ( Mestinon ), she has noticed some nausea with medication and has not been able to take it three times daily yet. She has not noticed an improvement of symptoms yet.   Chronic Tension Headaches: seen by Dr. Melrose Nakayama, taking Topamax and is doing better now  Abnormal CT thyroid: incidental finding of thyroid calcification of left lobe  Patient Active Problem List   Diagnosis Date Noted  . Abnormal imaging of thyroid 09/15/2015  . Seronegative myasthenia gravis ([place]) 08/08/2015  . Psoriasis 07/06/2015  . Stress incontinence 07/06/2015  . Chronic tension-type headache, intractable 05/29/2015  . Diplopia 05/25/2015  . Anxiety and depression 04/12/2015  . Asthma, mild intermittent 04/12/2015  . Bladder polyp 04/12/2015  . Carpal tunnel syndrome 04/12/2015  . Binocular vision disorder with diplopia 04/12/2015  . Dyslipidemia 04/12/2015  . H/O: HTN (hypertension) 04/12/2015  . Hemorrhoids, internal 04/12/2015  . Neuropathy ([place]) 04/12/2015  . Nuclear sclerotic cataract 04/12/2015  . Morbid obesity ([place]) 04/12/2015  . Obstructive apnea 04/12/2015  . Perennial allergic rhinitis 04/12/2015  . 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 Type 2 diabetes, controlled, with neuropathy ([place]) 04/12/2015  . Vitamin D deficiency 04/12/2015  . Cephalalgia 08/16/2014  . Lump or mass in breast 06/14/2013  . Bariatric surgery status 10/12/2012     Past Surgical History  Procedure Laterality Date  . Bariatric surgery  2014  . Upper gi endoscopy    . Colonoscopy  2012  . Abdominal hysterectomy  1991  . Knee surgery Right 1979-1980    Family History  Problem Relation Age of Onset  . Diabetes Mother   . Hypertension Mother   . Muscular dystrophy Sister     Social History   Social History  . Marital Status: Married    Spouse Name: N/A  . Number of Children: N/A  . Years of Education: N/A   Occupational History  . Not on file.   Social History Main Topics  . Smoking status: Former Smoker -- 3 years  . Smokeless tobacco: Never Used  . Alcohol Use: No  . Drug Use: No  . Sexual Activity: Not on file   Other Topics Concern  . Not on file   Social History Narrative     Current outpatient prescriptions:  .  acetaminophen (TYLENOL) 500 MG tablet, Take 500 mg by mouth every 6 (six) hours as needed. Reported on 07/06/2015, Disp: , Rfl:  .  albuterol (PROVENTIL) (2.5 MG/3ML) 0.083% nebulizer solution, Take 3 mLs (2.5 mg total) by nebulization every 6 (six) hours as needed for wheezing or shortness of breath., Disp: 150 mL, Rfl: 1 .  aspirin 81 MG chewable tablet, Chew 1 tablet (81 mg total) by mouth daily., Disp: 30 tablet, Rfl: 0 .  atorvastatin (LIPITOR) 20 MG tablet, Take 1 tablet (20 mg total) by mouth daily., Disp: 90 tablet, Rfl: 1 .  b complex vitamins  tablet, Take 1 tablet by mouth daily., Disp: , Rfl:  .  budesonide (RHINOCORT AQUA) 32 MCG/ACT nasal spray, Place 2 sprays into both nostrils daily., Disp: 1 Bottle, Rfl: 5 .  cholecalciferol (VITAMIN D) 1000 UNITS tablet, Take 1,000 Units by mouth daily., Disp: , Rfl:  .  cyanocobalamin 500 MCG tablet, Take 500 mcg by mouth daily., Disp: , Rfl:  .  DULoxetine (CYMBALTA) 60 MG capsule, Take 1 capsule (60 mg total) by mouth daily., Disp: 90 capsule, Rfl: 1 .  loratadine (CLARITIN) 10 MG tablet, Take 10 mg by mouth daily., Disp: , Rfl:  .  montelukast (SINGULAIR) 10  MG tablet, Take 1 tablet (10 mg total) by mouth at bedtime., Disp: 90 tablet, Rfl: 1 .  Multiple Vitamins-Minerals (MULTIVITAMIN WITH MINERALS) tablet, Take 1 tablet by mouth daily., Disp: , Rfl:  .  nortriptyline (PAMELOR) 10 MG capsule, Take 1 capsule (10 mg total) by mouth at bedtime., Disp: 90 capsule, Rfl: 1 .  pyridostigmine (MESTINON) 60 MG tablet, , Disp: , Rfl: 4 .  topiramate (TOPAMAX) 50 MG tablet, Take 50 mg by mouth 2 (two) times daily., Disp: , Rfl:   Allergies  Allergen Reactions  . Ace Inhibitors     cough  . Amlodipine     edema  . Gabapentin     edema  . Pregabalin      ROS  Constitutional: Negative for fever or weight change.  Respiratory: Negative for cough and shortness of breath.   Cardiovascular: Negative for chest pain or palpitations.  Gastrointestinal: Negative for abdominal pain, no bowel changes.  Musculoskeletal: Negative for gait problem or joint swelling.  Skin: Negative for rash.  Neurological: Negative for dizziness , positive for headache.  No other specific complaints in a complete review of systems (except as listed in HPI above).  Objective  Filed Vitals:   09/15/15 1522  BP: 118/76  Pulse: 71  Temp: 98.8 F (37.1 C)  TempSrc: Oral  Resp: 14  Weight: 221 lb 1.6 oz (100.29 kg)  SpO2: 97%    Body mass index is 40.43 kg/(m^2).  Physical Exam  Constitutional: Patient appears well-developed and well-nourished. Obese No distress.  HEENT: head atraumatic, normocephalic, pupils equal and reactive to light, neck supple, throat within normal limits Cardiovascular: Normal rate, regular rhythm and normal heart sounds.  No murmur heard. No BLE edema. Pulmonary/Chest: Effort normal and breath sounds normal. No respiratory distress. Abdominal: Soft.  There is no tenderness. Psychiatric: Patient has a normal mood and affect. behavior is normal. Judgment and thought content normal.  Recent Results (from the past 2160 hour(s))  VITAMIN D 25  Hydroxy (Vit-D Deficiency, Fractures)     Status: None   Collection Time: 08/16/15  4:18 PM  Result Value Ref Range   Vit D, 25-Hydroxy 45.1 30.0 - 100.0 ng/mL    Comment: Vitamin D deficiency has been defined by the [place] practice guideline as a level of serum 25-OH vitamin D less than 20 ng/mL (1,2). The Endocrine Society went on to further define vitamin D insufficiency as a level between 21 and 29 ng/mL (2). 1. IOM (Institute of Medicine). 2010. Dietary reference    intakes for calcium and D. [place]: The    [employer]. 2. Holick MF, Binkley Karnes, Bischoff-Ferrari HA, et al.    Evaluation, treatment, and prevention of vitamin D    deficiency: an Endocrine Society clinical practice    guideline. JCEM. 2011 Jul; 96(7):1911-30.  Hemoglobin A1c     Status: None   Collection Time: 08/16/15  4:18 PM  Result Value Ref Range   Hgb A1c MFr Bld 5.6 4.8 - 5.6 %    Comment:          Pre-diabetes: 5.7 - 6.4          Diabetes: >6.4          Glycemic control for adults with diabetes: <7.0    Est. average glucose Bld gHb Est-mCnc 114 mg/dL  Comprehensive metabolic panel     Status: Abnormal   Collection Time: 08/16/15  4:18 PM  Result Value Ref Range   Glucose 84 65 - 99 mg/dL   BUN 19 8 - 27 mg/dL   Creatinine, Ser 0.57 0.57 - 1.00 mg/dL   GFR calc non Af Amer 101 >59 mL/min/1.73   GFR calc Af Amer 117 >59 mL/min/1.73   BUN/Creatinine Ratio 33 (H) 11 - 26   Sodium 140 134 - 144 mmol/L   Potassium 4.1 3.5 - 5.2 mmol/L   Chloride 102 96 - 106 mmol/L   CO2 21 18 - 29 mmol/L   Calcium 9.3 8.7 - 10.3 mg/dL   Total Protein 6.8 6.0 - 8.5 g/dL   Albumin 4.2 3.6 - 4.8 g/dL   Globulin, Total 2.6 1.5 - 4.5 g/dL   Albumin/Globulin Ratio 1.6 1.1 - 2.5    Comment: **Effective September 04, 2015 the reference interval**   for A/G Ratio will be changing to:              Age                Female          Female           0 -  7 days       1.1  - 2.3       1.1 - 2.3           8 - 30 days       1.2 - 2.8       1.2 - 2.8           1 -  6 months     1.3 - 3.6       1.3 - 3.6    7 months -  5 years      1.5 - 2.6       1.5 - 2.6              > 5 years      1.2 - 2.2       1.2 - 2.2    Bilirubin Total 0.2 0.0 - 1.2 mg/dL   Alkaline Phosphatase 42 39 - 117 IU/L   AST 25 0 - 40 IU/L   ALT 33 (H) 0 - 32 IU/L  Lipid panel     Status: Abnormal   Collection Time: 08/16/15  4:18 PM  Result Value Ref Range   Cholesterol, Total 184 100 - 199 mg/dL   Triglycerides 108 0 - 149 mg/dL   HDL 61 >39 mg/dL   VLDL Cholesterol Cal 22 5 - 40 mg/dL   LDL Calculated 101 (H) 0 - 99 mg/dL   Chol/HDL Ratio 3.0 0.0 - 4.4 ratio units    Comment:  T. Chol/HDL Ratio                                             Men  Women                               1/2 Avg.Risk  3.4    3.3                                   Avg.Risk  5.0    4.4                                2X Avg.Risk  9.6    7.1                                3X Avg.Risk 23.4   11.0   Vitamin B12     Status: Abnormal   Collection Time: 08/16/15  4:18 PM  Result Value Ref Range   Vitamin B-12 1784 (H) 211 - 946 pg/mL  Ferritin     Status: None   Collection Time: 08/16/15  4:18 PM  Result Value Ref Range   Ferritin 79 15 - 150 ng/mL  CBC with Differential/Platelet     Status: Abnormal   Collection Time: 08/16/15  4:18 PM  Result Value Ref Range   WBC 8.2 3.4 - 10.8 x10E3/uL   RBC 4.69 3.77 - 5.28 x10E6/uL   Hemoglobin 13.5 11.1 - 15.9 g/dL   Hematocrit 40.6 34.0 - 46.6 %   MCV 87 79 - 97 fL   MCH 28.8 26.6 - 33.0 pg   MCHC 33.3 31.5 - 35.7 g/dL   RDW 13.2 12.3 - 15.4 %   Platelets 266 150 - 379 x10E3/uL   Neutrophils 42 %   Lymphs 48 %   Monocytes 8 %   Eos 2 %   Basos 0 %   Neutrophils Absolute 3.4 1.4 - 7.0 x10E3/uL   Lymphocytes Absolute 3.9 (H) 0.7 - 3.1 x10E3/uL   Monocytes Absolute 0.6 0.1 - 0.9 x10E3/uL   EOS (ABSOLUTE) 0.2 0.0 - 0.4 x10E3/uL    Basophils Absolute 0.0 0.0 - 0.2 x10E3/uL   Immature Granulocytes 0 %   Immature Grans (Abs) 0.0 0.0 - 0.1 x10E3/uL    PHQ2/9: Depression screen [employer] 2/9 09/15/2015 07/06/2015 04/12/2015  Decreased Interest 0 0 0  Down, Depressed, Hopeless 0 0 0  PHQ - 2 Score 0 0 0     Fall risk: Fall Risk  09/15/2015 07/06/2015 04/12/2015  Falls in the past year? No No No    Functional Status Survey: Is the patient deaf or have difficulty hearing?: No Does the patient have difficulty seeing, even when wearing glasses/contacts?: No Does the patient have difficulty concentrating, remembering, or making decisions?: No Does the patient have difficulty walking or climbing stairs?: No Does the patient have difficulty dressing or bathing?: No Does the patient have difficulty doing errands alone such as visiting a doctor's office or shopping?: No   Assessment & Plan  1. Asthma, mild intermittent, uncomplicated  - Spirometry: Peak - normal   2. Abnormal imaging of thyroid  -  US Soft Tissue Head/Neck; Future  3. Binocular vision disorder with diplopia  From Myasthenia gravis  4. Seronegative myasthenia gravis ([place])  Continue follow ups with neurologist

## 2015-09-19 ENCOUNTER — Other Ambulatory Visit: Payer: Self-pay | Admitting: Family Medicine

## 2015-09-19 ENCOUNTER — Ambulatory Visit
Admission: RE | Admit: 2015-09-19 | Discharge: 2015-09-19 | Disposition: A | Discharge status: Home / Self Care | Payer: 59 | Source: Ambulatory Visit | Attending: Family Medicine | Admitting: Family Medicine

## 2015-09-19 DIAGNOSIS — E042 Nontoxic multinodular goiter: Secondary | ICD-10-CM | POA: Diagnosis not present

## 2015-09-19 DIAGNOSIS — R9389 Abnormal findings on diagnostic imaging of other specified body structures: Secondary | ICD-10-CM

## 2015-09-19 DIAGNOSIS — R946 Abnormal results of thyroid function studies: Secondary | ICD-10-CM | POA: Diagnosis present

## 2015-09-25 DIAGNOSIS — G4733 Obstructive sleep apnea (adult) (pediatric): Secondary | ICD-10-CM | POA: Diagnosis not present

## 2015-09-28 DIAGNOSIS — G4733 Obstructive sleep apnea (adult) (pediatric): Secondary | ICD-10-CM | POA: Diagnosis not present

## 2015-09-28 DIAGNOSIS — E042 Nontoxic multinodular goiter: Secondary | ICD-10-CM | POA: Diagnosis not present

## 2015-10-11 DIAGNOSIS — B880 Other acariasis: Secondary | ICD-10-CM | POA: Diagnosis not present

## 2015-10-11 DIAGNOSIS — L400 Psoriasis vulgaris: Secondary | ICD-10-CM | POA: Diagnosis not present

## 2015-10-12 DIAGNOSIS — E041 Nontoxic single thyroid nodule: Secondary | ICD-10-CM | POA: Diagnosis not present

## 2015-10-12 DIAGNOSIS — E042 Nontoxic multinodular goiter: Secondary | ICD-10-CM | POA: Diagnosis not present

## 2015-10-13 DIAGNOSIS — E041 Nontoxic single thyroid nodule: Secondary | ICD-10-CM | POA: Diagnosis not present

## 2015-11-28 ENCOUNTER — Encounter: Payer: Self-pay | Admitting: Family Medicine

## 2015-12-05 DIAGNOSIS — H2513 Age-related nuclear cataract, bilateral: Secondary | ICD-10-CM | POA: Diagnosis not present

## 2015-12-05 DIAGNOSIS — H35372 Puckering of macula, left eye: Secondary | ICD-10-CM | POA: Diagnosis not present

## 2015-12-10 ENCOUNTER — Encounter: Payer: Self-pay | Admitting: Gynecology

## 2015-12-10 ENCOUNTER — Ambulatory Visit
Admission: EM | Admit: 2015-12-10 | Discharge: 2015-12-10 | Disposition: A | Discharge status: Home / Self Care | Payer: 59 | Attending: Family Medicine | Admitting: Family Medicine

## 2015-12-10 DIAGNOSIS — J018 Other acute sinusitis: Secondary | ICD-10-CM | POA: Diagnosis not present

## 2015-12-10 MED ORDER — AMOXICILLIN-POT CLAVULANATE 875-125 MG PO TABS: 1.0000 | ORAL_TABLET | Freq: Two times a day (BID) | ORAL | Status: DC

## 2015-12-10 NOTE — ED Provider Notes (Signed)
CSN: [id number]     Arrival date & time 12/10/15  1448 History   First MD Initiated Contact with Patient 12/10/15 1519     Chief Complaint  Patient presents with  . Sinus Problem   (Consider location/radiation/quality/duration/timing/severity/associated sxs/prior Treatment) HPI: Patient presents today with symptoms of right sinus pressure. She states that she is also had some nasal congestion for several days.. She has had a mild headache for 4 or 5 days as well. Patient often does have sinus issues and does take Rhinocort, Singulair. She states that she started to take a decongestant recently as well. She has noticed some improvement with this. She denies any other symptoms such as fever, chest pain, shortness of breath, weakness, nausea, vomiting. She denies any history of migraines. She cannot take NSAIDs due to her history of bariatric surgery.  Past Medical History  Diagnosis Date  . Hypertension   . Diabetes mellitus without complication ([place])   . Sleep apnea   . Depression   . Asthma   . HA (headache)   . Neuropathy, generalized ([place])     diabetic on feet  . Vaginal dryness, menopausal   . Anxiety   . Internal hemorrhoids   . Vitamin D deficiency   . Carpal tunnel syndrome   . Double vision     seen by Dr. 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 Obesity (BMI 30-39.9)   . Allergy   . Dyslipidemia   . Nuclear sclerotic cataract of both eyes   . Bladder polyp     Dr. Ernst Spell  . Obstructive sleep apnea on CPAP   . Frequency of urination   . Rotator cuff tendonitis 07/2009    also has impingment right shoulder pain, seen by Dr. Mack Guise  . Hx of bariatric surgery    Past Surgical History  Procedure Laterality Date  . Bariatric surgery  2014  . Upper gi endoscopy    . Colonoscopy  2012  . Abdominal hysterectomy  1991  . Knee surgery Right 1979-1980   Family History  Problem Relation Age of Onset  . Diabetes Mother   . Hypertension Mother   . Muscular dystrophy  Sister    Social History  Substance Use Topics  . Smoking status: Former Smoker -- 3 years  . Smokeless tobacco: Never Used  . Alcohol Use: No   OB History    Gravida Para Term Preterm AB TAB SAB Ectopic Multiple Living   2 2        2       Obstetric Comments   1st Menstrual Cycle:  11 1st Pregnancy:  20     Review of Systems: Negative except mentioned above.   Allergies  Ace inhibitors; Amlodipine; Gabapentin; and Pregabalin  Home Medications   Prior to Admission medications   Medication Sig Start Date End Date Taking? Authorizing Provider  acetaminophen (TYLENOL) 500 MG tablet Take 500 mg by mouth every 6 (six) hours as needed. Reported on 07/06/2015   Yes Historical Provider, MD  albuterol (PROVENTIL) (2.5 MG/3ML) 0.083% nebulizer solution Take 3 mLs (2.5 mg total) by nebulization every 6 (six) hours as needed for wheezing or shortness of breath. 07/06/15  Yes Steele Sizer, MD  aspirin 81 MG chewable tablet Chew 1 tablet (81 mg total) by mouth daily. 07/06/15  Yes Steele Sizer, MD  atorvastatin (LIPITOR) 20 MG tablet Take 1 tablet (20 mg total) by mouth daily. 07/06/15  Yes Steele Sizer, MD  budesonide (RHINOCORT AQUA) 32 MCG/ACT nasal spray  Place 2 sprays into both nostrils daily. 07/06/15  Yes Steele Sizer, MD  DULoxetine (CYMBALTA) 60 MG capsule Take 1 capsule (60 mg total) by mouth daily. 07/06/15  Yes Steele Sizer, MD  loratadine (CLARITIN) 10 MG tablet Take 10 mg by mouth daily.   Yes Historical Provider, MD  montelukast (SINGULAIR) 10 MG tablet Take 1 tablet (10 mg total) by mouth at bedtime. 07/06/15  Yes Steele Sizer, MD  Multiple Vitamins-Minerals (MULTIVITAMIN WITH MINERALS) tablet Take 1 tablet by mouth daily.   Yes Historical Provider, MD  nortriptyline (PAMELOR) 10 MG capsule Take 1 capsule (10 mg total) by mouth at bedtime. 07/06/15  Yes Steele Sizer, MD  pyridostigmine (MESTINON) 60 MG tablet  09/05/15  Yes Historical Provider, MD  b complex vitamins tablet  Take 1 tablet by mouth daily.    Historical Provider, MD  cholecalciferol (VITAMIN D) 1000 UNITS tablet Take 1,000 Units by mouth daily.    Historical Provider, MD  cyanocobalamin 500 MCG tablet Take 500 mcg by mouth daily.    Historical Provider, MD  topiramate (TOPAMAX) 50 MG tablet Take 50 mg by mouth 2 (two) times daily.    Historical Provider, MD   Meds Ordered and Administered this Visit  Medications - No data to display  BP 154/74 mmHg  Pulse 63  Temp(Src) 98.9 F (37.2 C) (Oral)  Resp 16  Ht 5\' 2"  (1.575 m)  Wt 220 lb (99.791 kg)  BMI 40.23 kg/m2  SpO2 99% No data found.   Physical Exam:  GENERAL: NAD HEENT: no pharyngeal erythema, no exudate, no erythema of TMs, no cervical LAD, mild to moderate tenderness of right maxillary sinus RESP: CTA B CARD: RRR NEURO: CN II-XII grossly intact   ED Course  Procedures (including critical care time)  Labs Review Labs Reviewed - No data to display  Imaging Review No results found.   MDM  A/P: Sinusitis- patient given prescription for Augmentin, Tylenol, continue nasal spray and oral antihistamine, patient to monitor blood pressure if she is going to continue taking pseudoephedrine as it is elevated today. If she is going to continue taking the pseudoephedrine and should only be for a few days. If patient's symptoms do persist or worsen I do recommend that the patient seek medical attention as discussed.    Paulina Fusi, MD 12/10/15 [phone]

## 2015-12-10 NOTE — ED Notes (Signed)
Per patient sinus pressure / head ache x 5 days

## 2015-12-15 ENCOUNTER — Ambulatory Visit: Payer: 59 | Admitting: Family Medicine

## 2015-12-15 ENCOUNTER — Encounter: Payer: Self-pay | Admitting: Family Medicine

## 2015-12-15 ENCOUNTER — Ambulatory Visit (INDEPENDENT_AMBULATORY_CARE_PROVIDER_SITE_OTHER): Payer: 59 | Admitting: Family Medicine

## 2015-12-15 VITALS — BP 124/76 | HR 67 | Temp 98.1°F | Resp 16 | Ht 62.0 in | Wt 222.1 lb

## 2015-12-15 DIAGNOSIS — E1140 Type 2 diabetes mellitus with diabetic neuropathy, unspecified: Secondary | ICD-10-CM

## 2015-12-15 DIAGNOSIS — G44221 Chronic tension-type headache, intractable: Secondary | ICD-10-CM | POA: Diagnosis not present

## 2015-12-15 DIAGNOSIS — J4520 Mild intermittent asthma, uncomplicated: Secondary | ICD-10-CM | POA: Diagnosis not present

## 2015-12-15 DIAGNOSIS — G7000 Myasthenia gravis without (acute) exacerbation: Secondary | ICD-10-CM | POA: Diagnosis not present

## 2015-12-15 DIAGNOSIS — J0100 Acute maxillary sinusitis, unspecified: Secondary | ICD-10-CM | POA: Diagnosis not present

## 2015-12-15 DIAGNOSIS — Z9884 Bariatric surgery status: Secondary | ICD-10-CM | POA: Diagnosis not present

## 2015-12-15 DIAGNOSIS — Z23 Encounter for immunization: Secondary | ICD-10-CM | POA: Diagnosis not present

## 2015-12-15 DIAGNOSIS — E785 Hyperlipidemia, unspecified: Secondary | ICD-10-CM | POA: Diagnosis not present

## 2015-12-15 DIAGNOSIS — G4733 Obstructive sleep apnea (adult) (pediatric): Secondary | ICD-10-CM

## 2015-12-15 DIAGNOSIS — B354 Tinea corporis: Secondary | ICD-10-CM | POA: Diagnosis not present

## 2015-12-15 DIAGNOSIS — G629 Polyneuropathy, unspecified: Secondary | ICD-10-CM

## 2015-12-15 DIAGNOSIS — J3089 Other allergic rhinitis: Secondary | ICD-10-CM

## 2015-12-15 DIAGNOSIS — J309 Allergic rhinitis, unspecified: Secondary | ICD-10-CM

## 2015-12-15 LAB — POCT GLYCOSYLATED HEMOGLOBIN (HGB A1C): Hemoglobin A1C: 5.1

## 2015-12-15 MED ORDER — DULOXETINE HCL 60 MG PO CPEP: 60.0000 mg | ORAL_CAPSULE | Freq: Every day | ORAL | Status: DC

## 2015-12-15 MED ORDER — TERBINAFINE HCL 1 % EX CREA: 1.0000 "application " | TOPICAL_CREAM | Freq: Two times a day (BID) | CUTANEOUS | Status: DC

## 2015-12-15 MED ORDER — BUTALBITAL-APAP-CAFF-COD 50-325-40-30 MG PO CAPS: 1.0000 | ORAL_CAPSULE | ORAL | Status: DC | PRN

## 2015-12-15 MED ORDER — ATORVASTATIN CALCIUM 20 MG PO TABS: 20.0000 mg | ORAL_TABLET | Freq: Every day | ORAL | Status: DC

## 2015-12-15 MED ORDER — MONTELUKAST SODIUM 10 MG PO TABS: 10.0000 mg | ORAL_TABLET | Freq: Every day | ORAL | Status: DC

## 2015-12-15 NOTE — Progress Notes (Signed)
Name: Jasmine Woods   MRN: [id number]    DOB: 10/18/1955   Date:12/15/2015       Progress Note  Subjective  Chief Complaint  Chief Complaint  Patient presents with  . Medication Refill    3 month F/U  . Diabetes    Patient has not checked her sugar in the past couple of weeks but has no complains of high blood sugars  . Asthma    Well controlled  . URI    Patient was given Augmentin for a Sinus Infection at [facility] on 12/10/15 and is still experiencing headaches (ongoing for the past 2 weeks)  . Hyperlipidemia  . Rash    Onset-1 year intermittently on right hand, patient states the anti-fungal cream prescription and it went away but came back in the same spot. Patient states it is itchy.  . Allergic Rhinitis     Coughing and sneezing, takes medication daily    HPI  DMII: she is on diet only since bariatric surgery in 2014, she denies polyphagia, polydipsia or polyuria  Hyperlipidemia: taking Atorvastatin 20 mg daily and no side effects, no chest pain or SOB  Asthma: she is feeling well, no SOB, no wheezing , but mild cough since recent URI with associated sneezing and post-nasal drainage  Myasthenia Gravis: diagnosed at [facility] by Dr. Burnett Harry, seronegative, only causing ptosis and double vision, no other symptoms. She had a negative chest CT for thymoma, and has started on medication ( Mestinon ).  She has gone up on the dose and still has double vision, but slightly better physically.   Chronic Tension Headaches: seen by Dr. Melrose Nakayama, taking Topamax 100 mg daily. She was doing well, but over the past 10 days  she has intermittent daily headache, right maxillary pain, radiates to upper teeth also behind right eye and goes to the occipital area. She went to Urgent Care and was given Augmentin, it has helped with facial pain, but still struggling with headache. She has a history of Myasthenia Gravis and see a neurologist at [facility] also. Advised to follow up with  neurologists if no improvement of symptoms.   Abnormal CT thyroid: incidental finding of thyroid calcification of left lobe, she saw Endo and had negative biopsy  Peripheral neuropathy: seen by Neurologist, doing well on Cymbalta.   Patient Active Problem List   Diagnosis Date Noted  . Abnormal imaging of thyroid 09/15/2015  . Seronegative myasthenia gravis ([place]) 08/08/2015  . Psoriasis 07/06/2015  . Stress incontinence 07/06/2015  . Chronic tension-type headache, intractable 05/29/2015  . Diplopia 05/25/2015  . Anxiety and depression 04/12/2015  . Asthma, mild intermittent 04/12/2015  . Bladder polyp 04/12/2015  . Carpal tunnel syndrome 04/12/2015  . Binocular vision disorder with diplopia 04/12/2015  . Dyslipidemia 04/12/2015  . H/O: HTN (hypertension) 04/12/2015  . Hemorrhoids, internal 04/12/2015  . Neuropathy ([place]) 04/12/2015  . Nuclear sclerotic cataract 04/12/2015  . Morbid obesity ([place]) 04/12/2015  . Obstructive apnea 04/12/2015  . Perennial allergic rhinitis 04/12/2015  . 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 Type 2 diabetes, controlled, with neuropathy ([place]) 04/12/2015  . Vitamin D deficiency 04/12/2015  . Cephalalgia 08/16/2014  . Lump or mass in breast 06/14/2013  . Bariatric surgery status 10/12/2012    Past Surgical History  Procedure Laterality Date  . Bariatric surgery  2014  . Upper gi endoscopy    . Colonoscopy  2012  . Abdominal hysterectomy  1991  . Knee surgery Right [phone]  Family History  Problem Relation Age of Onset  . Diabetes Mother   . Hypertension Mother   . Muscular dystrophy Sister     Social History   Social History  . Marital Status: Married    Spouse Name: N/A  . Number of Children: N/A  . Years of Education: N/A   Occupational History  . Not on file.   Social History Main Topics  . Smoking status: Former Smoker -- 3 years  . Smokeless tobacco: Never Used  . Alcohol Use: No  . Drug Use:  No  . Sexual Activity: Not on file   Other Topics Concern  . Not on file   Social History Narrative     Current outpatient prescriptions:  .  acetaminophen (TYLENOL) 500 MG tablet, Take 500 mg by mouth every 6 (six) hours as needed. Reported on 07/06/2015, Disp: , Rfl:  .  albuterol (PROVENTIL) (2.5 MG/3ML) 0.083% nebulizer solution, Take 3 mLs (2.5 mg total) by nebulization every 6 (six) hours as needed for wheezing or shortness of breath., Disp: 150 mL, Rfl: 1 .  amoxicillin-clavulanate (AUGMENTIN) 875-125 MG tablet, Take 1 tablet by mouth every 12 (twelve) hours., Disp: 14 tablet, Rfl: 0 .  aspirin 81 MG chewable tablet, Chew 1 tablet (81 mg total) by mouth daily., Disp: 30 tablet, Rfl: 0 .  atorvastatin (LIPITOR) 20 MG tablet, Take 1 tablet (20 mg total) by mouth daily., Disp: 90 tablet, Rfl: 1 .  budesonide (RHINOCORT AQUA) 32 MCG/ACT nasal spray, Place 2 sprays into both nostrils daily., Disp: 1 Bottle, Rfl: 5 .  DULoxetine (CYMBALTA) 60 MG capsule, Take 1 capsule (60 mg total) by mouth daily., Disp: 90 capsule, Rfl: 1 .  Flurandrenolide 0.05 % CREA, , Disp: , Rfl: 2 .  loratadine (CLARITIN) 10 MG tablet, Take 10 mg by mouth daily., Disp: , Rfl:  .  montelukast (SINGULAIR) 10 MG tablet, Take 1 tablet (10 mg total) by mouth at bedtime., Disp: 90 tablet, Rfl: 1 .  Multiple Vitamins-Minerals (MULTIVITAMIN WITH MINERALS) tablet, Take 1 tablet by mouth daily., Disp: , Rfl:  .  pyridostigmine (MESTINON) 60 MG tablet, , Disp: , Rfl: 4 .  topiramate (TOPAMAX) 50 MG tablet, Take 50 mg by mouth 2 (two) times daily., Disp: , Rfl:  .  butalbital-acetaminophen-caffeine (FIORICET/CODEINE) 50-325-40-30 MG capsule, Take 1 capsule by mouth every 4 (four) hours as needed for headache., Disp: 30 capsule, Rfl: 0  Allergies  Allergen Reactions  . Ace Inhibitors     cough  . Amlodipine     edema  . Gabapentin     edema  . Pregabalin      ROS  Ten systems reviewed and is negative except as  mentioned in HPI   Objective  Filed Vitals:   12/15/15 1432  BP: 124/76  Pulse: 67  Temp: 98.1 F (36.7 C)  TempSrc: Oral  Resp: 16  Height: 5\' 2"  (1.575 m)  Weight: 222 lb 1.6 oz (100.744 kg)  SpO2: 95%    Body mass index is 40.61 kg/(m^2).  Physical Exam  Constitutional: Patient appears well-developed and well-nourished. Obese No distress.  HEENT: head atraumatic, normocephalic, pupils equal and reactive to light, neck supple, throat within normal limits Cardiovascular: Normal rate, regular rhythm and normal heart sounds.  No murmur heard. No BLE edema. Pulmonary/Chest: Effort normal and breath sounds normal. No respiratory distress. Abdominal: Soft.  There is no tenderness. Psychiatric: Patient has a normal mood and affect. behavior is normal. Judgment and thought content  normal. Skin: circular rash on dorsal aspect of right hand with clearing in the center  Recent Results (from the past 2160 hour(s))  POCT HgB A1C     Status: Normal   Collection Time: 12/15/15  2:41 PM  Result Value Ref Range   Hemoglobin A1C 5.1       PHQ2/9: Depression screen [facility] 2/9 12/15/2015 09/15/2015 07/06/2015 04/12/2015  Decreased Interest 0 0 0 0  Down, Depressed, Hopeless 0 0 0 0  PHQ - 2 Score 0 0 0 0    Fall Risk: Fall Risk  12/15/2015 09/15/2015 07/06/2015 04/12/2015  Falls in the past year? No No No No     Functional Status Survey: Is the patient deaf or have difficulty hearing?: No Does the patient have difficulty seeing, even when wearing glasses/contacts?: No Does the patient have difficulty concentrating, remembering, or making decisions?: No Does the patient have difficulty walking or climbing stairs?: No Does the patient have difficulty dressing or bathing?: No Does the patient have difficulty doing errands alone such as visiting a doctor's office or shopping?: No    Assessment & Plan  1. Type 2 diabetes, controlled, with neuropathy ([place])  Doing well, on life style  modification  - POCT HgB A1C - DULoxetine (CYMBALTA) 60 MG capsule; Take 1 capsule (60 mg total) by mouth daily.  Dispense: 90 capsule; Refill: 1  2. Chronic tension-type headache, intractable  Call neurologist if no improvement - butalbital-acetaminophen-caffeine (FIORICET/CODEINE) 50-325-40-30 MG capsule; Take 1 capsule by mouth every 4 (four) hours as needed for headache.  Dispense: 30 capsule; Refill: 0  3. Dyslipidemia  - atorvastatin (LIPITOR) 20 MG tablet; Take 1 tablet (20 mg total) by mouth daily.  Dispense: 90 tablet; Refill: 1  4. Seronegative myasthenia gravis ([place])  Keep follow up with Dr. Burnett Harry  5. Obstructive apnea  Still wears CPAP every night, she usually wakes up feeling rested, no snoring with CPAP machine  6. Bariatric surgery status  Discussed with the patient the risk posed by an increased BMI. Discussed importance of portion control, calorie counting and at least 150 minutes of physical activity weekly. Avoid sweet beverages and drink more water. Eat at least 6 servings of fruit and vegetables daily   7. Acute maxillary sinusitis, recurrence not specified  - butalbital-acetaminophen-caffeine (FIORICET/CODEINE) 50-325-40-30 MG capsule; Take 1 capsule by mouth every 4 (four) hours as needed for headache.  Dispense: 30 capsule; Refill: 0  8. Need for Tdap vaccination  - Tdap vaccine greater than or equal to 7yo IM  9. Asthma, mild intermittent, uncomplicated  - montelukast (SINGULAIR) 10 MG tablet; Take 1 tablet (10 mg total) by mouth at bedtime.  Dispense: 90 tablet; Refill: 1  10. Perennial allergic rhinitis  - montelukast (SINGULAIR) 10 MG tablet; Take 1 tablet (10 mg total) by mouth at bedtime.  Dispense: 90 tablet; Refill: 1  11. Peripheral polyneuropathy (HCC)  - DULoxetine (CYMBALTA) 60 MG capsule; Take 1 capsule (60 mg total) by mouth daily.  Dispense: 90 capsule; Refill: 1  12. Tinea corporis  - terbinafine (LAMISIL AT) 1 % cream; Apply 1  application topically 2 (two) times daily.  Dispense: 30 g; Refill: 0

## 2016-02-06 DIAGNOSIS — G4733 Obstructive sleep apnea (adult) (pediatric): Secondary | ICD-10-CM | POA: Diagnosis not present

## 2016-02-08 DIAGNOSIS — G44221 Chronic tension-type headache, intractable: Secondary | ICD-10-CM | POA: Diagnosis not present

## 2016-02-08 DIAGNOSIS — H532 Diplopia: Secondary | ICD-10-CM | POA: Diagnosis not present

## 2016-02-08 DIAGNOSIS — G7000 Myasthenia gravis without (acute) exacerbation: Secondary | ICD-10-CM | POA: Diagnosis not present

## 2016-02-16 DIAGNOSIS — H35372 Puckering of macula, left eye: Secondary | ICD-10-CM | POA: Diagnosis not present

## 2016-03-13 DIAGNOSIS — G7000 Myasthenia gravis without (acute) exacerbation: Secondary | ICD-10-CM | POA: Diagnosis not present

## 2016-03-20 DIAGNOSIS — H2511 Age-related nuclear cataract, right eye: Secondary | ICD-10-CM | POA: Diagnosis not present

## 2016-03-20 DIAGNOSIS — E119 Type 2 diabetes mellitus without complications: Secondary | ICD-10-CM | POA: Diagnosis not present

## 2016-03-20 DIAGNOSIS — H35372 Puckering of macula, left eye: Secondary | ICD-10-CM | POA: Diagnosis not present

## 2016-03-20 DIAGNOSIS — H2512 Age-related nuclear cataract, left eye: Secondary | ICD-10-CM | POA: Diagnosis not present

## 2016-03-20 DIAGNOSIS — H43811 Vitreous degeneration, right eye: Secondary | ICD-10-CM | POA: Diagnosis not present

## 2016-03-22 ENCOUNTER — Ambulatory Visit (INDEPENDENT_AMBULATORY_CARE_PROVIDER_SITE_OTHER): Payer: 59 | Admitting: Family Medicine

## 2016-03-22 ENCOUNTER — Encounter: Payer: Self-pay | Admitting: Family Medicine

## 2016-03-22 ENCOUNTER — Other Ambulatory Visit: Payer: Self-pay | Admitting: Ophthalmology

## 2016-03-22 VITALS — BP 122/78 | HR 69 | Temp 98.6°F | Resp 16 | Ht 62.0 in | Wt 226.6 lb

## 2016-03-22 DIAGNOSIS — G4733 Obstructive sleep apnea (adult) (pediatric): Secondary | ICD-10-CM | POA: Diagnosis not present

## 2016-03-22 DIAGNOSIS — G7000 Myasthenia gravis without (acute) exacerbation: Secondary | ICD-10-CM | POA: Diagnosis not present

## 2016-03-22 DIAGNOSIS — Z9884 Bariatric surgery status: Secondary | ICD-10-CM | POA: Diagnosis not present

## 2016-03-22 DIAGNOSIS — R946 Abnormal results of thyroid function studies: Secondary | ICD-10-CM

## 2016-03-22 DIAGNOSIS — E785 Hyperlipidemia, unspecified: Secondary | ICD-10-CM

## 2016-03-22 DIAGNOSIS — E1140 Type 2 diabetes mellitus with diabetic neuropathy, unspecified: Secondary | ICD-10-CM | POA: Diagnosis not present

## 2016-03-22 DIAGNOSIS — R9389 Abnormal findings on diagnostic imaging of other specified body structures: Secondary | ICD-10-CM

## 2016-03-22 DIAGNOSIS — J4520 Mild intermittent asthma, uncomplicated: Secondary | ICD-10-CM | POA: Diagnosis not present

## 2016-03-22 LAB — POCT GLYCOSYLATED HEMOGLOBIN (HGB A1C): Hemoglobin A1C: 5.6

## 2016-03-22 NOTE — Progress Notes (Signed)
Name: Jasmine Woods   MRN: [id number]    DOB: 07-Aug-1955   Date:03/22/2016       Progress Note  Subjective  Chief Complaint  Chief Complaint  Patient presents with  . Diabetes    pt here for 3 month follow up 80low 85 avg 85 high checks twice a week  . Asthma  . Hyperlipidemia  . Hypertension    HPI    DMII: she is on diet only since bariatric surgery in 2014, she denies polyphagia, polydipsia or polyuria. She has not been exercising and is also not following a diabetic diet and hgbA1C has gone up form 5.1% to 5.7% . Neuropathy is still 2/10, constant, burning like on Cymbalta.   Hyperlipidemia: taking Atorvastatin 20 mg daily and no side effects, no chest pain or SOB  Asthma: she is feeling well, no SOB, no wheezing,, currently on singulair at night and Ventolin prn.   Myasthenia Gravis: diagnosed at [facility] by Dr. Burnett Harry, seronegative, only causing ptosis and double vision, no other symptoms. She had a negative chest CT for thymoma, and has started on medication ( Mestinon ).  She has gone up on the dose and still has double vision.  Chronic Tension Headaches: seen by Dr. Melrose Nakayama, taking Topamax 100 mg daily. Currently doing well, no longer needs Fioricet and is doing well now. No nausea, vomiting.    Abnormal CT thyroid: incidental finding of thyroid calcification of left lobe, she saw Endo and had negative biopsy  Peripheral neuropathy: seen by Neurologist, doing well on Cymbalta. She has been taking Cymbalta at night and advised to switch to am, gradually changing the time.   Bariatric Surgery/Obesity: she had bariatric surgery back in 2014 , her weight was almost 300 lbs, went down to close to 200lbs, but is gradually gaining weight, she will resume exercise and will try to resume her diet, avoid carbohydrates. Explained importance of using Myfitnesspal   Patient Active Problem List   Diagnosis Date Noted  . Abnormal imaging of thyroid 09/15/2015  .  Seronegative myasthenia gravis ([place]) 08/08/2015  . Psoriasis 07/06/2015  . Stress incontinence 07/06/2015  . Chronic tension-type headache, intractable 05/29/2015  . Anxiety and depression 04/12/2015  . Asthma, mild intermittent 04/12/2015  . Bladder polyp 04/12/2015  . Carpal tunnel syndrome 04/12/2015  . Binocular vision disorder with diplopia 04/12/2015  . Dyslipidemia 04/12/2015  . H/O: HTN (hypertension) 04/12/2015  . Hemorrhoids, internal 04/12/2015  . Neuropathy ([place]) 04/12/2015  . Nuclear sclerotic cataract 04/12/2015  . Morbid obesity ([place]) 04/12/2015  . Obstructive apnea 04/12/2015  . Perennial allergic rhinitis 04/12/2015  . 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 Type 2 diabetes, controlled, with neuropathy ([place]) 04/12/2015  . Vitamin D deficiency 04/12/2015  . Cephalalgia 08/16/2014  . Lump or mass in breast 06/14/2013  . Bariatric surgery status 10/12/2012    Past Surgical History:  Procedure Laterality Date  . ABDOMINAL HYSTERECTOMY  1991  . [place] SURGERY  2014  . COLONOSCOPY  2012  . KNEE SURGERY Right 1979-1980  . UPPER GI ENDOSCOPY      Family History  Problem Relation Age of Onset  . Diabetes Mother   . Hypertension Mother   . Muscular dystrophy Sister     Social History   Social History  . Marital status: Married    Spouse name: N/A  . Number of children: N/A  . Years of education: N/A   Occupational History  . Not on file.   Social  History Main Topics  . Smoking status: Former Smoker    Years: 3.00  . Smokeless tobacco: Never Used  . Alcohol use No  . Drug use: No  . Sexual activity: Not on file   Other Topics Concern  . Not on file   Social History Narrative  . No narrative on file     Current Outpatient Prescriptions:  .  acetaminophen (TYLENOL) 500 MG tablet, Take 1,000 mg by mouth every 6 (six) hours as needed for mild pain. Reported on 07/06/2015, Disp: , Rfl:  .  albuterol (PROVENTIL) (2.5  MG/3ML) 0.083% nebulizer solution, Take 3 mLs (2.5 mg total) by nebulization every 6 (six) hours as needed for wheezing or shortness of breath., Disp: 150 mL, Rfl: 1 .  aspirin 81 MG chewable tablet, Chew 1 tablet (81 mg total) by mouth daily., Disp: 30 tablet, Rfl: 0 .  atorvastatin (LIPITOR) 20 MG tablet, Take 1 tablet (20 mg total) by mouth daily. (Patient taking differently: Take 20 mg by mouth every evening. ), Disp: 90 tablet, Rfl: 1 .  budesonide (RHINOCORT AQUA) 32 MCG/ACT nasal spray, Place 2 sprays into both nostrils daily. (Patient taking differently: Place 2 sprays into both nostrils at bedtime. ), Disp: 1 Bottle, Rfl: 5 .  Calcium Carbonate-Vit D-Min (CALTRATE 600+D PLUS PO), Take 1 tablet by mouth daily., Disp: , Rfl:  .  DULoxetine (CYMBALTA) 60 MG capsule, Take 1 capsule (60 mg total) by mouth daily. (Patient taking differently: Take 60 mg by mouth every evening. ), Disp: 90 capsule, Rfl: 1 .  loratadine (CLARITIN) 10 MG tablet, Take 10 mg by mouth daily., Disp: , Rfl:  .  montelukast (SINGULAIR) 10 MG tablet, Take 1 tablet (10 mg total) by mouth at bedtime., Disp: 90 tablet, Rfl: 1 .  Multiple Vitamins-Minerals (MULTIVITAMIN WITH MINERALS) tablet, Take 1 tablet by mouth daily., Disp: , Rfl:  .  Polyethyl Glycol-Propyl Glycol (SYSTANE OP), Apply 1 drop to eye as needed. Dry eyes, Disp: , Rfl:  .  pyridostigmine (MESTINON) 60 MG tablet, Take 60 mg by mouth 4 (four) times daily. , Disp: , Rfl: 4 .  terbinafine (LAMISIL AT) 1 % cream, Apply 1 application topically 2 (two) times daily., Disp: 30 g, Rfl: 0 .  topiramate (TOPAMAX) 100 MG tablet, Take 100 mg by mouth at bedtime., Disp: , Rfl:   Allergies  Allergen Reactions  . Ace Inhibitors     cough  . Amlodipine     edema  . Gabapentin     edema  . Pregabalin Other (See Comments)    anxiety     ROS  Constitutional: Negative for fever, positive for weight change.  Respiratory: Negative for cough and shortness of breath.    Cardiovascular: Negative for chest pain or palpitations.  Gastrointestinal: Negative for abdominal pain, no bowel changes.  Musculoskeletal: Negative for gait problem or joint swelling.  Skin: Negative for rash.  Neurological: Negative for dizziness or headache.  No other specific complaints in a complete review of systems (except as listed in HPI above).   Objective  Vitals:   03/22/16 1437  BP: 122/78  Pulse: 69  Resp: 16  Temp: 98.6 F (37 C)  SpO2: 96%  Weight: 226 lb 9 oz (102.8 kg)  Height: 5\' 2"  (1.575 m)    Body mass index is 41.44 kg/m.  Physical Exam  Constitutional: Patient appears well-developed and well-nourished. Obese  No distress.  HEENT: head atraumatic, normocephalic, pupils equal and reactive to light,neck supple, throat  within normal limits Cardiovascular: Normal rate, regular rhythm and normal heart sounds.  No murmur heard. No BLE edema. Pulmonary/Chest: Effort normal and breath sounds normal. No respiratory distress. Abdominal: Soft.  There is no tenderness. Psychiatric: Patient has a normal mood and affect. behavior is normal. Judgment and thought content normal.  PHQ2/9: Depression screen [facility] 2/9 03/22/2016 12/15/2015 09/15/2015 07/06/2015 04/12/2015  Decreased Interest 0 0 0 0 0  Down, Depressed, Hopeless 0 0 0 0 0  PHQ - 2 Score 0 0 0 0 0     Fall Risk: Fall Risk  03/22/2016 12/15/2015 09/15/2015 07/06/2015 04/12/2015  Falls in the past year? No No No No No      Functional Status Survey: Is the patient deaf or have difficulty hearing?: No Does the patient have difficulty seeing, even when wearing glasses/contacts?: No Does the patient have difficulty concentrating, remembering, or making decisions?: No Does the patient have difficulty walking or climbing stairs?: No Does the patient have difficulty dressing or bathing?: No Does the patient have difficulty doing errands alone such as visiting a doctor's office or shopping?:  No    Assessment & Plan  1. Type 2 diabetes, controlled, with neuropathy (HCC)  - POCT HgB A1C  2. Seronegative myasthenia gravis ([place])  Continue follow up with neurologist   3. Morbid obesity, unspecified obesity type [facility])  Discussed with the patient the risk posed by an increased BMI. Discussed importance of portion control, calorie counting and at least 150 minutes of physical activity weekly. Avoid sweet beverages and drink more water. Eat at least 6 servings of fruit and vegetables daily   4. Abnormal imaging of thyroid  Seen by Endo, biopsy negative  5. Asthma, mild intermittent, uncomplicated  Doing well at this time  6. Dyslipidemia  Continue Lipitor   7. Obstructive apnea  She is very compliant with CPAP   8. Bariatric surgery status  Needs to resume diet

## 2016-03-27 ENCOUNTER — Ambulatory Visit (HOSPITAL_COMMUNITY): Payer: 59 | Admitting: Anesthesiology

## 2016-03-27 ENCOUNTER — Ambulatory Visit (HOSPITAL_COMMUNITY)
Admission: RE | Admit: 2016-03-27 | Discharge: 2016-03-27 | Disposition: A | Discharge status: Home / Self Care | Payer: 59 | Source: Ambulatory Visit | Attending: Ophthalmology | Admitting: Ophthalmology

## 2016-03-27 ENCOUNTER — Other Ambulatory Visit (HOSPITAL_COMMUNITY): Payer: Self-pay | Admitting: Ophthalmology

## 2016-03-27 ENCOUNTER — Encounter (HOSPITAL_COMMUNITY): Payer: Self-pay | Admitting: *Deleted

## 2016-03-27 ENCOUNTER — Encounter (HOSPITAL_COMMUNITY)
Admission: RE | Disposition: A | Discharge status: Home / Self Care | Payer: Self-pay | Source: Ambulatory Visit | Attending: Ophthalmology

## 2016-03-27 ENCOUNTER — Ambulatory Visit (HOSPITAL_COMMUNITY): Payer: Self-pay | Admitting: Ophthalmology

## 2016-03-27 DIAGNOSIS — Q158 Other specified congenital malformations of eye: Secondary | ICD-10-CM | POA: Diagnosis not present

## 2016-03-27 DIAGNOSIS — Z87891 Personal history of nicotine dependence: Secondary | ICD-10-CM | POA: Diagnosis not present

## 2016-03-27 DIAGNOSIS — J45909 Unspecified asthma, uncomplicated: Secondary | ICD-10-CM | POA: Insufficient documentation

## 2016-03-27 DIAGNOSIS — E119 Type 2 diabetes mellitus without complications: Secondary | ICD-10-CM | POA: Diagnosis not present

## 2016-03-27 DIAGNOSIS — F418 Other specified anxiety disorders: Secondary | ICD-10-CM | POA: Diagnosis not present

## 2016-03-27 DIAGNOSIS — H5462 Unqualified visual loss, left eye, normal vision right eye: Secondary | ICD-10-CM | POA: Insufficient documentation

## 2016-03-27 DIAGNOSIS — I10 Essential (primary) hypertension: Secondary | ICD-10-CM | POA: Insufficient documentation

## 2016-03-27 DIAGNOSIS — H35372 Puckering of macula, left eye: Secondary | ICD-10-CM | POA: Insufficient documentation

## 2016-03-27 DIAGNOSIS — H35342 Macular cyst, hole, or pseudohole, left eye: Secondary | ICD-10-CM | POA: Diagnosis not present

## 2016-03-27 DIAGNOSIS — G4730 Sleep apnea, unspecified: Secondary | ICD-10-CM | POA: Diagnosis not present

## 2016-03-27 DIAGNOSIS — H43822 Vitreomacular adhesion, left eye: Secondary | ICD-10-CM | POA: Diagnosis not present

## 2016-03-27 DIAGNOSIS — Z7982 Long term (current) use of aspirin: Secondary | ICD-10-CM | POA: Diagnosis not present

## 2016-03-27 DIAGNOSIS — Z79899 Other long term (current) drug therapy: Secondary | ICD-10-CM | POA: Diagnosis not present

## 2016-03-27 HISTORY — PX: PARS PLANA VITRECTOMY: SHX2166

## 2016-03-27 HISTORY — DX: Cardiac murmur, unspecified: R01.1

## 2016-03-27 HISTORY — DX: Myasthenia gravis without (acute) exacerbation: G70.00

## 2016-03-27 LAB — CBC
HCT: 41.6 % (ref 36.0–46.0)
Hemoglobin: 14.1 g/dL (ref 12.0–15.0)
MCH: 29.9 pg (ref 26.0–34.0)
MCHC: 33.9 g/dL (ref 30.0–36.0)
MCV: 88.3 fL (ref 78.0–100.0)
PLATELETS: 256 10*3/uL (ref 150–400)
RBC: 4.71 MIL/uL (ref 3.87–5.11)
RDW: 12.2 % (ref 11.5–15.5)
WBC: 8.9 10*3/uL (ref 4.0–10.5)

## 2016-03-27 LAB — BASIC METABOLIC PANEL
Anion gap: 15 (ref 5–15)
BUN: 16 mg/dL (ref 6–20)
CHLORIDE: 108 mmol/L (ref 101–111)
CO2: 18 mmol/L — ABNORMAL LOW (ref 22–32)
CREATININE: 0.55 mg/dL (ref 0.44–1.00)
Calcium: 9.4 mg/dL (ref 8.9–10.3)
Glucose, Bld: 87 mg/dL (ref 65–99)
Potassium: 4.3 mmol/L (ref 3.5–5.1)
SODIUM: 141 mmol/L (ref 135–145)

## 2016-03-27 LAB — GLUCOSE, CAPILLARY
GLUCOSE-CAPILLARY: 91 mg/dL (ref 65–99)
Glucose-Capillary: 63 mg/dL — ABNORMAL LOW (ref 65–99)
Glucose-Capillary: 80 mg/dL (ref 65–99)

## 2016-03-27 LAB — NO BLOOD PRODUCTS

## 2016-03-27 SURGERY — PARS PLANA VITRECTOMY WITH 25 GAUGE
Anesthesia: Monitor Anesthesia Care | Site: Eye | Laterality: Left

## 2016-03-27 SURGERY — PARS PLANA VITRECTOMY WITH 25 GAUGE
Anesthesia: Monitor Anesthesia Care | Laterality: Left

## 2016-03-27 MED ORDER — STERILE WATER FOR INJECTION IJ SOLN
INTRAMUSCULAR | Status: DC | PRN
  Administered 2016-03-27: 10 mL via INTRAOCULAR

## 2016-03-27 MED ORDER — DEXAMETHASONE SODIUM PHOSPHATE 10 MG/ML IJ SOLN
INTRAMUSCULAR | Status: AC
  Filled 2016-03-27: qty 1

## 2016-03-27 MED ORDER — LACTATED RINGERS IV SOLN
INTRAVENOUS | Status: DC | PRN
  Administered 2016-03-27: 14:00:00 via INTRAVENOUS

## 2016-03-27 MED ORDER — HYPROMELLOSE (GONIOSCOPIC) 2.5 % OP SOLN
OPHTHALMIC | Status: AC
  Filled 2016-03-27: qty 15

## 2016-03-27 MED ORDER — BSS PLUS IO SOLN
INTRAOCULAR | Status: AC
  Filled 2016-03-27: qty 500

## 2016-03-27 MED ORDER — BSS IO SOLN
INTRAOCULAR | Status: AC
  Filled 2016-03-27: qty 15

## 2016-03-27 MED ORDER — SODIUM CHLORIDE 0.9 % IV SOLN
INTRAVENOUS | Status: DC
  Administered 2016-03-27: 12:00:00 via INTRAVENOUS

## 2016-03-27 MED ORDER — BSS PLUS IO SOLN
INTRAOCULAR | Status: DC | PRN
  Administered 2016-03-27: 1 via INTRAOCULAR

## 2016-03-27 MED ORDER — SODIUM HYALURONATE 10 MG/ML IO SOLN
INTRAOCULAR | Status: AC
  Filled 2016-03-27: qty 0.85

## 2016-03-27 MED ORDER — SODIUM CHLORIDE 0.9 % IJ SOLN
INTRAMUSCULAR | Status: AC
  Filled 2016-03-27: qty 10

## 2016-03-27 MED ORDER — GATIFLOXACIN 0.5 % OP SOLN
1.0000 [drp] | OPHTHALMIC | Status: AC | PRN
  Administered 2016-03-27 (×3): 1 [drp] via OPHTHALMIC

## 2016-03-27 MED ORDER — GENTAMICIN SULFATE 40 MG/ML IJ SOLN
INTRAMUSCULAR | Status: AC
  Filled 2016-03-27: qty 2

## 2016-03-27 MED ORDER — DEXTROSE 5 % IV SOLN
INTRAVENOUS | Status: DC | PRN
  Administered 2016-03-27: 25 mL via INTRAVENOUS

## 2016-03-27 MED ORDER — MIDAZOLAM HCL 5 MG/5ML IJ SOLN
INTRAMUSCULAR | Status: DC | PRN
  Administered 2016-03-27 (×2): 1 mg via INTRAVENOUS

## 2016-03-27 MED ORDER — INDOCYANINE GREEN 25 MG IV SOLR
INTRAVENOUS | Status: DC | PRN
  Administered 2016-03-27: 25 mg

## 2016-03-27 MED ORDER — POLYMYXIN B SULFATE 500000 UNITS IJ SOLR
INTRAMUSCULAR | Status: AC
  Filled 2016-03-27: qty 500000

## 2016-03-27 MED ORDER — DEXAMETHASONE SODIUM PHOSPHATE 10 MG/ML IJ SOLN
INTRAMUSCULAR | Status: DC | PRN
  Administered 2016-03-27: 10 mg

## 2016-03-27 MED ORDER — TETRACAINE HCL 0.5 % OP SOLN
OPHTHALMIC | Status: AC
  Filled 2016-03-27: qty 2

## 2016-03-27 MED ORDER — PHENYLEPHRINE 40 MCG/ML (10ML) SYRINGE FOR IV PUSH (FOR BLOOD PRESSURE SUPPORT)
PREFILLED_SYRINGE | INTRAVENOUS | Status: AC
  Filled 2016-03-27: qty 10

## 2016-03-27 MED ORDER — ONDANSETRON HCL 4 MG/2ML IJ SOLN
INTRAMUSCULAR | Status: DC | PRN
  Administered 2016-03-27: 4 mg via INTRAVENOUS

## 2016-03-27 MED ORDER — EPINEPHRINE HCL 1 MG/ML IJ SOLN
INTRAMUSCULAR | Status: AC
  Filled 2016-03-27: qty 1

## 2016-03-27 MED ORDER — HYPROMELLOSE (GONIOSCOPIC) 2.5 % OP SOLN
OPHTHALMIC | Status: DC | PRN
  Administered 2016-03-27: 1 [drp] via OPHTHALMIC

## 2016-03-27 MED ORDER — PROPOFOL 10 MG/ML IV BOLUS
INTRAVENOUS | Status: DC | PRN
  Administered 2016-03-27: 50 mg via INTRAVENOUS

## 2016-03-27 MED ORDER — CYCLOPENTOLATE HCL 1 % OP SOLN
OPHTHALMIC | Status: AC
  Filled 2016-03-27: qty 2

## 2016-03-27 MED ORDER — LIDOCAINE HCL 2 % IJ SOLN
INTRAMUSCULAR | Status: AC
  Filled 2016-03-27: qty 20

## 2016-03-27 MED ORDER — ONDANSETRON HCL 4 MG/2ML IJ SOLN
INTRAMUSCULAR | Status: AC
  Filled 2016-03-27: qty 6

## 2016-03-27 MED ORDER — PHENYLEPHRINE HCL 2.5 % OP SOLN
OPHTHALMIC | Status: AC
  Filled 2016-03-27: qty 2

## 2016-03-27 MED ORDER — PHENYLEPHRINE HCL 2.5 % OP SOLN
1.0000 [drp] | OPHTHALMIC | Status: AC | PRN
  Administered 2016-03-27 (×3): 1 [drp] via OPHTHALMIC

## 2016-03-27 MED ORDER — ROCURONIUM BROMIDE 10 MG/ML (PF) SYRINGE
PREFILLED_SYRINGE | INTRAVENOUS | Status: AC
  Filled 2016-03-27: qty 20

## 2016-03-27 MED ORDER — LIDOCAINE 2% (20 MG/ML) 5 ML SYRINGE
INTRAMUSCULAR | Status: AC
  Filled 2016-03-27: qty 10

## 2016-03-27 MED ORDER — BSS IO SOLN
INTRAOCULAR | Status: DC | PRN
  Administered 2016-03-27: 15 mL via INTRAOCULAR

## 2016-03-27 MED ORDER — EPINEPHRINE HCL 1 MG/ML IJ SOLN
INTRAMUSCULAR | Status: DC | PRN
  Administered 2016-03-27: .3 mL

## 2016-03-27 MED ORDER — LIDOCAINE HCL 2 % IJ SOLN
INTRAMUSCULAR | Status: DC | PRN
Start: 2016-03-27 — End: 2016-03-27
  Administered 2016-03-27: 20 mL

## 2016-03-27 MED ORDER — NA CHONDROIT SULF-NA HYALURON 40-30 MG/ML IO SOLN
INTRAOCULAR | Status: AC
  Filled 2016-03-27: qty 0.5

## 2016-03-27 MED ORDER — CYCLOPENTOLATE HCL 1 % OP SOLN
1.0000 [drp] | OPHTHALMIC | Status: AC | PRN
  Administered 2016-03-27 (×3): 1 [drp] via OPHTHALMIC

## 2016-03-27 MED ORDER — GATIFLOXACIN 0.5 % OP SOLN
OPHTHALMIC | Status: AC
  Filled 2016-03-27: qty 2.5

## 2016-03-27 MED ORDER — MIDAZOLAM HCL 2 MG/2ML IJ SOLN
INTRAMUSCULAR | Status: AC
  Filled 2016-03-27: qty 2

## 2016-03-27 SURGICAL SUPPLY — 61 items
APPLICATOR COTTON TIP 6IN STRL (MISCELLANEOUS) ×3 IMPLANT
APPLICATOR DR MATTHEWS STRL (MISCELLANEOUS) IMPLANT
BLADE MVR KNIFE 20G (BLADE) IMPLANT
CANNULA ANT CHAM MAIN (OPHTHALMIC RELATED) IMPLANT
CANNULA VLV SOFT TIP 25GA (OPHTHALMIC) ×3 IMPLANT
CORDS BIPOLAR (ELECTRODE) IMPLANT
COVER MAYO STAND STRL (DRAPES) IMPLANT
DRAPE INCISE 51X51 W/FILM STRL (DRAPES) IMPLANT
DRAPE OPHTHALMIC 77X100 STRL (CUSTOM PROCEDURE TRAY) ×3 IMPLANT
DRAPE ORTHO SPLIT 77X108 STRL (DRAPES) ×2
DRAPE SURG ORHT 6 SPLT 77X108 (DRAPES) ×1 IMPLANT
FILTER BLUE MILLIPORE (MISCELLANEOUS) IMPLANT
FORCEPS ECKARDT ILM 25G SERR (OPHTHALMIC RELATED) IMPLANT
FORCEPS GRIESHABER ILM 25G A (INSTRUMENTS) IMPLANT
FORCEPS HORIZONTAL 25G DISP (OPHTHALMIC RELATED) IMPLANT
GAS AUTO FILL CONSTEL (OPHTHALMIC)
GAS AUTO FILL CONSTELLATION (OPHTHALMIC) IMPLANT
GLOVE BIOGEL PI IND STRL 7.0 (GLOVE) ×1 IMPLANT
GLOVE BIOGEL PI INDICATOR 7.0 (GLOVE) ×2
GLOVE SS BIOGEL STRL SZ 8.5 (GLOVE) ×1 IMPLANT
GLOVE SUPERSENSE BIOGEL SZ 8.5 (GLOVE) ×2
GLOVE SURG SS PI 7.0 STRL IVOR (GLOVE) ×3 IMPLANT
GOWN STRL REUS W/ TWL LRG LVL3 (GOWN DISPOSABLE) ×1 IMPLANT
GOWN STRL REUS W/ TWL XL LVL3 (GOWN DISPOSABLE) ×1 IMPLANT
GOWN STRL REUS W/TWL LRG LVL3 (GOWN DISPOSABLE) ×2
GOWN STRL REUS W/TWL XL LVL3 (GOWN DISPOSABLE) ×2
KIT BASIN OR (CUSTOM PROCEDURE TRAY) ×3 IMPLANT
KNIFE CRESCENT 2.5 55 ANG (BLADE) IMPLANT
LENS BIOM SUPER VIEW SET DISP (OPHTHALMIC RELATED) ×3 IMPLANT
MICROPICK 25G (MISCELLANEOUS)
NEEDLE 18GX1X1/2 (RX/OR ONLY) (NEEDLE) IMPLANT
NEEDLE 25GX 5/8IN NON SAFETY (NEEDLE) IMPLANT
NEEDLE FILTER BLUNT 18X 1/2SAF (NEEDLE)
NEEDLE FILTER BLUNT 18X1 1/2 (NEEDLE) IMPLANT
NEEDLE HYPO 25GX1X1/2 BEV (NEEDLE) IMPLANT
NS IRRIG 1000ML POUR BTL (IV SOLUTION) ×3 IMPLANT
PACK FRAGMATOME (OPHTHALMIC) IMPLANT
PACK VITRECTOMY CUSTOM (CUSTOM PROCEDURE TRAY) ×3 IMPLANT
PAD ARMBOARD 7.5X6 YLW CONV (MISCELLANEOUS) ×6 IMPLANT
PAK PIK VITRECTOMY CVS 25GA (OPHTHALMIC) ×3 IMPLANT
PENCIL BIPOLAR 25GA STR DISP (OPHTHALMIC RELATED) IMPLANT
PICK MICROPICK 25G (MISCELLANEOUS) IMPLANT
PROBE LASER ILLUM FLEX CVD 25G (OPHTHALMIC) IMPLANT
ROLLS DENTAL (MISCELLANEOUS) IMPLANT
SCRAPER DIAMOND 25GA (OPHTHALMIC RELATED) IMPLANT
SET INJECTOR OIL FLUID CONSTEL (OPHTHALMIC) IMPLANT
STOCKINETTE IMPERVIOUS 9X36 MD (GAUZE/BANDAGES/DRESSINGS) ×6 IMPLANT
STOPCOCK 4 WAY LG BORE MALE ST (IV SETS) IMPLANT
SUT ETHILON 10 0 CS140 6 (SUTURE) IMPLANT
SUT ETHILON 8 0 BV130 4 (SUTURE) IMPLANT
SUT MERSILENE 5 0 RD 1 DA (SUTURE) IMPLANT
SUT PROLENE 10 0 CIF 4 DA (SUTURE) IMPLANT
SUT VICRYL 7 0 TG140 8 (SUTURE) IMPLANT
SYR 30ML SLIP (SYRINGE) IMPLANT
SYR 5ML LL (SYRINGE) ×3 IMPLANT
SYR TB 1ML LUER SLIP (SYRINGE) IMPLANT
SYRINGE 10CC LL (SYRINGE) ×3 IMPLANT
TAPE SURG TRANSPORE 1 IN (GAUZE/BANDAGES/DRESSINGS) ×1 IMPLANT
TAPE SURGICAL TRANSPORE 1 IN (GAUZE/BANDAGES/DRESSINGS) ×2
WATER STERILE IRR 1000ML POUR (IV SOLUTION) ×3 IMPLANT
WIPE INSTRUMENT VISIWIPE 73X73 (MISCELLANEOUS) IMPLANT

## 2016-03-27 NOTE — Brief Op Note (Signed)
Preoperative diagnosis: 1 epiretinal membrane with macular topographic distortion left eye  2. Vision loss left eye  Postoperative diagnoses the same  Procedure&#1 vitrectomy with membrane peel-internal limiting membrane and epiretinal membrane left eye  Anesthesia Local retrobulbar with monitored anesthesia control, Xylocaine  2%   Surgeon Hurman Horn M.D.  Complications none

## 2016-03-27 NOTE — Anesthesia Postprocedure Evaluation (Signed)
Anesthesia Post Note  Patient: Jasmine Woods  Procedure(s) Performed: Procedure(s) (LRB): PARS PLANA VITRECTOMY WITH 25 GAUGE AND MEMBRANE PEEL, INTERNAL LIMITING MEMBRANE (Left)  Patient location during evaluation: PACU Anesthesia Type: MAC Level of consciousness: awake and alert Pain management: pain level controlled Vital Signs Assessment: post-procedure vital signs reviewed and stable Respiratory status: spontaneous breathing, nonlabored ventilation and respiratory function stable Cardiovascular status: stable and blood pressure returned to baseline Anesthetic complications: no    Last Vitals:  Vitals:   03/27/16 1545 03/27/16 1600  BP: 128/70 125/65  Pulse: (!) 54 (!) 57  Resp: 18 18  Temp:  36.7 C    Last Pain:  Vitals:   03/27/16 1229  TempSrc: Oral                 Nilda Simmer

## 2016-03-27 NOTE — Progress Notes (Signed)
Hypoglycemic Event  CBG:63  Treatment: 15 GM carbohydrate snack  Symptoms: None  Follow-up CBG: Time:1551 CBG Result:80  Possible Reasons for Event: Inadequate meal intake  Comments/MD notified:    Pilar Plate

## 2016-03-27 NOTE — Transfer of Care (Signed)
Immediate Anesthesia Transfer of Care Note  Patient: Jasmine Woods  Procedure(s) Performed: Procedure(s): PARS PLANA VITRECTOMY WITH 25 GAUGE AND MEMBRANE PEEL (Left)  Patient Location: PACU  Anesthesia Type:MAC  Level of Consciousness: awake, alert , oriented and patient cooperative  Airway & Oxygen Therapy: Patient Spontanous Breathing  Post-op Assessment: Report given to RN and Post -op Vital signs reviewed and stable  Post vital signs: Reviewed and stable  Last Vitals:  Vitals:   03/27/16 1214 03/27/16 1229  BP: (!) 151/78   Pulse: (!) 59   Resp: 18   Temp:  37.1 C    Last Pain:  Vitals:   03/27/16 1229  TempSrc: Oral      Patients Stated Pain Goal: 8 ([id number])  Complications: No apparent anesthesia complications

## 2016-03-27 NOTE — Anesthesia Preprocedure Evaluation (Addendum)
Anesthesia Evaluation  Patient identified by MRN, date of birth, ID band Patient awake    Reviewed: Allergy & Precautions, NPO status , Patient's Chart, lab work & pertinent test results  Airway Mallampati: I  TM Distance: >3 FB Neck ROM: Full    Dental  (+) Teeth Intact, Dental Advisory Given   Pulmonary asthma , sleep apnea , former smoker,    breath sounds clear to auscultation       Cardiovascular hypertension,  Rhythm:Regular Rate:Normal     Neuro/Psych  Headaches, PSYCHIATRIC DISORDERS Anxiety Depression  Neuromuscular disease    GI/Hepatic negative GI ROS, Neg liver ROS,   Endo/Other  diabetes, Type 2  Renal/GU   negative genitourinary   Musculoskeletal  (+) Arthritis , Osteoarthritis,    Abdominal   Peds negative pediatric ROS (+)  Hematology negative hematology ROS (+)   Anesthesia Other Findings - MG  Reproductive/Obstetrics negative OB ROS                            Anesthesia Physical Anesthesia Plan  ASA: III  Anesthesia Plan: MAC   Post-op Pain Management:    Induction: Intravenous  Airway Management Planned: Natural Airway  Additional Equipment:   Intra-op Plan:   Post-operative Plan:   Informed Consent: I have reviewed the patients History and Physical, chart, labs and discussed the procedure including the risks, benefits and alternatives for the proposed anesthesia with the patient or authorized representative who has indicated his/her understanding and acceptance.   Dental advisory given  Plan Discussed with: CRNA  Anesthesia Plan Comments:         Anesthesia Quick Evaluation

## 2016-03-27 NOTE — Op Note (Signed)
Preoperative diagnosis: 1 epiretinal membrane with macular topographic distortion left eye  2. Vision loss left eye  Postoperative diagnoses the same  Procedure&#1 vitrectomy with membrane peel-internal limiting membrane and epiretinal membrane left eye  Anesthesia Local retrobulbar with monitored anesthesia control, Xylocaine  2%   Surgeon Hurman Horn M.D.  Complications none  Indication for procedure. Vision loss left eye not correctable with medical or refractive therapy  Preoperatively patient understands the risk of anesthesia including the rare occurrence of death but also to the eye including but not limited to from the underlying condition surgical pair, vision loss, no change of vision, loss of vision and progression of disease despite intervention, need for another surgery,. She also understands clearly that there is going to be a progression of cataract in this is a operative eye which will require routine cataract surgery later time.  Description of procedure in detail  Patient brought to the operating room after appropriate signed consent had been obtained. [Region was identified surgical timeout with staff and surgeon. [Region was anesthetized with 2% Xylocaine 5 mL retrobulbar with additional 5 mL laterally in fashion modified Kirk Ruths with the Skarlett Sedlacek modification.  [Region sterilely prepped and draped usual ophthalmic fashion. Lid speculum applied. 25-gauge trocar placed in inferotemporal quadrant placement verified visually infusion turned on. Trochars were applied. Checked is a begun. Care is taken to avoid the native lens. Her hyaloid spontaneously removed. Next air exchange completed. Under air a diluted solution of ICG was injected. Staining pattern of epiretinal membrane combined with internal limiting membrane was noted. A fluid phase I used 25-gauge forceps to engage the junction of the epiretinal and internal limiting membrane staining. I was able to remove much of  the topographic distortion. Underlying the epiretinal membrane appear that may be remnants of internal limiting membrane. A second fluid air exchange completed. Under air and reinjected diluted ICG. Area under this was then the fluid phase again I removed the residual internal limiting membrane off the foveal region. Typical post ILM removal changes were noted. Because of the severe thickening and topographic distortion that remain in the fovea and seen preoperatively. I did elect at this time to placeand to retain air in the vitreous cavity. Fluid air exchange completed. Appropriate intraocular pressures remain. Superior trochars removed followed by the infusion. Subconjunctival Decadron applied. Sterile patch and Fox shield applied. Patient tolerated procedure competition taken to the PACU. Patient be discharged home today as an outpatient.

## 2016-03-27 NOTE — Progress Notes (Signed)
Pt is Jehovah Witness and refuses blood transfusion.

## 2016-03-27 NOTE — Anesthesia Procedure Notes (Signed)
Procedure Name: MAC Date/Time: 03/27/2016 2:52 PM Performed by: Everlean Cherry A Pre-anesthesia Checklist: Patient identified, Emergency Drugs available, Suction available and Patient being monitored Patient Re-evaluated:Patient Re-evaluated prior to inductionOxygen Delivery Method: Nasal cannula Preoxygenation: Pre-oxygenation with 100% oxygen

## 2016-03-27 NOTE — H&P (Signed)
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 Vision loss affects activities of daily living.  Current medical chart has been reviewed. Cases been reviewed. Sleep apnea and uses C Pap on a regular basis at night.  Is 2030 vision the right eye 20/80 vision in the left. Patient is mononuclears chronic cataract in each eye which are not visually significant at this time. Is no diabetic retinopathy in either eye. Optical coherence tomography examination macula left eye confirms severe topographic distortion of the foveal region on the basis of epiretinal membrane. The cystic the cause of her poor vision left eye. New. Impression #1 epiretinal membrane with severe topographic distortion macular region of the left eye. This is visually significant. Patient says [place] in attempt to release the tractional forces of that under that the underlying retina may heal and potentially improve visual acuity. Patient also understands a progressive nucleus chronic cataract changes and cloudiness the cataract will form in the left eye over the next 6-12 months in all likelihood.  Planned, posterior vitrectomy with membrane peel  peel of left eye under local anesthesia with monitored anesthesia control here came hospital.

## 2016-03-29 ENCOUNTER — Encounter (HOSPITAL_COMMUNITY): Payer: Self-pay | Admitting: Ophthalmology

## 2016-04-03 DIAGNOSIS — H35372 Puckering of macula, left eye: Secondary | ICD-10-CM | POA: Diagnosis not present

## 2016-04-03 DIAGNOSIS — Z09 Encounter for follow-up examination after completed treatment for conditions other than malignant neoplasm: Secondary | ICD-10-CM | POA: Diagnosis not present

## 2016-04-27 HISTORY — PX: VITRECTOMY: SHX106

## 2016-05-07 DIAGNOSIS — H35372 Puckering of macula, left eye: Secondary | ICD-10-CM | POA: Diagnosis not present

## 2016-05-07 DIAGNOSIS — Z09 Encounter for follow-up examination after completed treatment for conditions other than malignant neoplasm: Secondary | ICD-10-CM | POA: Diagnosis not present

## 2016-05-29 ENCOUNTER — Encounter: Payer: Self-pay | Admitting: Family Medicine

## 2016-05-29 ENCOUNTER — Other Ambulatory Visit: Payer: Self-pay | Admitting: Family Medicine

## 2016-05-29 MED ORDER — TERBINAFINE HCL 250 MG PO TABS: 250.0000 mg | ORAL_TABLET | Freq: Every day | ORAL | 0 refills | Status: DC

## 2016-05-31 DIAGNOSIS — H40002 Preglaucoma, unspecified, left eye: Secondary | ICD-10-CM | POA: Diagnosis not present

## 2016-06-03 LAB — HM DIABETES EYE EXAM

## 2016-06-04 ENCOUNTER — Encounter: Payer: Self-pay | Admitting: Family Medicine

## 2016-06-20 DIAGNOSIS — G4733 Obstructive sleep apnea (adult) (pediatric): Secondary | ICD-10-CM | POA: Diagnosis not present

## 2016-07-01 ENCOUNTER — Encounter: Payer: Self-pay | Admitting: Family Medicine

## 2016-07-01 ENCOUNTER — Ambulatory Visit (INDEPENDENT_AMBULATORY_CARE_PROVIDER_SITE_OTHER): Payer: 59 | Admitting: Family Medicine

## 2016-07-01 VITALS — BP 122/74 | HR 78 | Temp 99.4°F | Resp 16 | Ht 62.0 in | Wt 233.6 lb

## 2016-07-01 DIAGNOSIS — G7000 Myasthenia gravis without (acute) exacerbation: Secondary | ICD-10-CM | POA: Diagnosis not present

## 2016-07-01 DIAGNOSIS — J3089 Other allergic rhinitis: Secondary | ICD-10-CM

## 2016-07-01 DIAGNOSIS — G4733 Obstructive sleep apnea (adult) (pediatric): Secondary | ICD-10-CM | POA: Diagnosis not present

## 2016-07-01 DIAGNOSIS — E559 Vitamin D deficiency, unspecified: Secondary | ICD-10-CM | POA: Diagnosis not present

## 2016-07-01 DIAGNOSIS — Z79899 Other long term (current) drug therapy: Secondary | ICD-10-CM | POA: Diagnosis not present

## 2016-07-01 DIAGNOSIS — G44221 Chronic tension-type headache, intractable: Secondary | ICD-10-CM | POA: Diagnosis not present

## 2016-07-01 DIAGNOSIS — E1140 Type 2 diabetes mellitus with diabetic neuropathy, unspecified: Secondary | ICD-10-CM

## 2016-07-01 DIAGNOSIS — Z9884 Bariatric surgery status: Secondary | ICD-10-CM

## 2016-07-01 DIAGNOSIS — G629 Polyneuropathy, unspecified: Secondary | ICD-10-CM

## 2016-07-01 DIAGNOSIS — E785 Hyperlipidemia, unspecified: Secondary | ICD-10-CM

## 2016-07-01 LAB — POCT UA - MICROALBUMIN: Microalbumin Ur, POC: 20 mg/L

## 2016-07-01 LAB — POCT GLYCOSYLATED HEMOGLOBIN (HGB A1C): HEMOGLOBIN A1C: 5.9

## 2016-07-01 MED ORDER — TOPIRAMATE 100 MG PO TABS: 100.0000 mg | ORAL_TABLET | Freq: Every day | ORAL | 1 refills | Status: DC

## 2016-07-01 MED ORDER — DULOXETINE HCL 60 MG PO CPEP: 60.0000 mg | ORAL_CAPSULE | Freq: Every day | ORAL | 1 refills | Status: DC

## 2016-07-01 MED ORDER — MONTELUKAST SODIUM 10 MG PO TABS: 10.0000 mg | ORAL_TABLET | Freq: Every day | ORAL | 1 refills | Status: DC

## 2016-07-01 MED ORDER — ATORVASTATIN CALCIUM 20 MG PO TABS: 20.0000 mg | ORAL_TABLET | Freq: Every evening | ORAL | 1 refills | Status: DC

## 2016-07-01 NOTE — Progress Notes (Signed)
Name: Jasmine Woods   MRN: [id number]    DOB: Oct 30, 1955   Date:07/01/2016       Progress Note  Subjective  Chief Complaint  Chief Complaint  Patient presents with  . Diabetes    3 month follow up low 46 high 110 since last visit  . Asthma  . Hyperlipidemia  . Obesity    HPI  DMII: she is on diet only since bariatric surgery in 2014, she denies polyphagia, polydipsia or polyuria. She has not been exercising and is also not following a diabetic diet and hgbA1C has gone up from 5.6% to 5.9% . Neuropathy is still 2/10, constant, burning like on both feet, she is on Cymbalta and seems to improve symptoms.   Hyperlipidemia: taking Atorvastatin 20 mg daily and no side effects, no chest pain or SOB, due for labs in Feb 2018  Asthma: she is feeling well, no SOB, no wheezing, currently on singulair at night and Ventolin prn- last flare of asthma was about one year ago    Myasthenia Gravis: diagnosed at [facility] by Dr. Burnett Harry, 09/2015, seronegative, only causing ptosis and double vision, no other symptoms. She had a negative chest CT for thymoma, and has started on medication ( Mestinon ), she states it works quickly but wears out very fast. She takes before driving and for work. She is mostly driving when going to work   Chronic Tension Headaches: seen by Dr. Melrose Nakayama, taking Topamax 100 mg daily. Currently doing well, no longer needs Fioricet and is doing well now. No nausea, vomiting, photophobia, phonophobia   Abnormal CT thyroid: incidental finding of thyroid calcification of left lobe, she saw Endo and had negative biopsy  Peripheral neuropathy: seen by Neurologist, doing well on Cymbalta. She has been taking Cymbalta at night and advised to switch to am, gradually changing the time. Is feeling better since she switched Cymbalta from pm to am.  Bariatric Surgery/Obesity: she had bariatric surgery back in 2014 , her weight was almost 300 lbs, went down to close to 200lbs, but is  gradually gaining weight, she will resume exercise and will try to resume her diet, avoid carbohydrates She is snacking on carbs.   OSA: she is wearing CPAP every night, all night, she does not wake up with headaches, and feels good during the day  Rash on right hand: she noticed a circular rash on dorsal aspect of right hand for months, we tried topical anti-fungal followed by oral Lamisil for 2 weeks, which improved itching but still has the rash, she will see Dr. Griffith Citron as a walk in for further evaluation   Patient Active Problem List   Diagnosis Date Noted  . Abnormal imaging of thyroid 09/15/2015  . Seronegative myasthenia gravis ([place]) 08/08/2015  . Psoriasis 07/06/2015  . Stress incontinence 07/06/2015  . Chronic tension-type headache, intractable 05/29/2015  . Anxiety and depression 04/12/2015  . Asthma, mild intermittent 04/12/2015  . Bladder polyp 04/12/2015  . Carpal tunnel syndrome 04/12/2015  . Binocular vision disorder with diplopia 04/12/2015  . Dyslipidemia 04/12/2015  . H/O: HTN (hypertension) 04/12/2015  . Hemorrhoids, internal 04/12/2015  . Neuropathy ([place]) 04/12/2015  . Nuclear sclerotic cataract 04/12/2015  . Morbid obesity ([place]) 04/12/2015  . Obstructive apnea 04/12/2015  . Perennial allergic rhinitis 04/12/2015  . 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 Type 2 diabetes, controlled, with neuropathy ([place]) 04/12/2015  . Vitamin D deficiency 04/12/2015  . Cephalalgia 08/16/2014  . Lump or mass in breast  06/14/2013  . Bariatric surgery status 10/12/2012    Past Surgical History:  Procedure Laterality Date  . ABDOMINAL HYSTERECTOMY  1991  . [place] SURGERY  2014  . COLONOSCOPY  2012  . KNEE SURGERY Right 1979-1980  . PARS PLANA VITRECTOMY Left 03/27/2016   Procedure: PARS PLANA VITRECTOMY WITH 25 GAUGE AND MEMBRANE PEEL, INTERNAL LIMITING MEMBRANE;  Surgeon: Hurman Horn, MD;  Location: [facility];  Service: Ophthalmology;   Laterality: Left;  . THYROID SURGERY     BX done was benign  . UPPER GI ENDOSCOPY      Family History  Problem Relation Age of Onset  . Diabetes Mother   . Hypertension Mother   . Muscular dystrophy Sister     Social History   Social History  . Marital status: Married    Spouse name: N/A  . Number of children: N/A  . Years of education: N/A   Occupational History  . Not on file.   Social History Main Topics  . Smoking status: Former Smoker    Years: 3.00  . Smokeless tobacco: Never Used  . Alcohol use No  . Drug use: No  . Sexual activity: Not on file   Other Topics Concern  . Not on file   Social History Narrative  . No narrative on file     Current Outpatient Prescriptions:  .  acetaminophen (TYLENOL) 500 MG tablet, Take 1,000 mg by mouth every 6 (six) hours as needed for mild pain. Reported on 07/06/2015, Disp: , Rfl:  .  albuterol (PROVENTIL) (2.5 MG/3ML) 0.083% nebulizer solution, Take 3 mLs (2.5 mg total) by nebulization every 6 (six) hours as needed for wheezing or shortness of breath., Disp: 150 mL, Rfl: 1 .  aspirin 81 MG chewable tablet, Chew 1 tablet (81 mg total) by mouth daily., Disp: 30 tablet, Rfl: 0 .  atorvastatin (LIPITOR) 20 MG tablet, Take 1 tablet (20 mg total) by mouth every evening., Disp: 90 tablet, Rfl: 1 .  budesonide (RHINOCORT AQUA) 32 MCG/ACT nasal spray, Place 2 sprays into both nostrils daily. (Patient taking differently: Place 2 sprays into both nostrils at bedtime. ), Disp: 1 Bottle, Rfl: 5 .  Calcium Carbonate-Vit D-Min (CALTRATE 600+D PLUS PO), Take 1 tablet by mouth daily., Disp: , Rfl:  .  DULoxetine (CYMBALTA) 60 MG capsule, Take 1 capsule (60 mg total) by mouth daily., Disp: 90 capsule, Rfl: 1 .  loratadine (CLARITIN) 10 MG tablet, Take 10 mg by mouth daily., Disp: , Rfl:  .  montelukast (SINGULAIR) 10 MG tablet, Take 1 tablet (10 mg total) by mouth at bedtime., Disp: 90 tablet, Rfl: 1 .  Multiple Vitamins-Minerals (MULTIVITAMIN  WITH MINERALS) tablet, Take 1 tablet by mouth daily., Disp: , Rfl:  .  Polyethyl Glycol-Propyl Glycol (SYSTANE OP), Apply 1 drop to eye as needed. Dry eyes, Disp: , Rfl:  .  pyridostigmine (MESTINON) 60 MG tablet, Take 60 mg by mouth 4 (four) times daily. , Disp: , Rfl: 4 .  topiramate (TOPAMAX) 100 MG tablet, Take 1 tablet (100 mg total) by mouth at bedtime., Disp: 90 tablet, Rfl: 1  Allergies  Allergen Reactions  . Ace Inhibitors     cough  . Amlodipine     edema  . Gabapentin     edema  . Pregabalin Other (See Comments)    anxiety     ROS  Constitutional: Negative for fever or significant weight change.  Respiratory: Negative for cough and shortness of breath.   Cardiovascular:  Negative for chest pain or palpitations.  Gastrointestinal: Negative for abdominal pain, no bowel changes.  Musculoskeletal: Negative for gait problem or joint swelling.  Skin: Positive for rash.  Neurological: Negative for dizziness or headache.  No other specific complaints in a complete review of systems (except as listed in HPI above).  Objective  Vitals:   07/01/16 1324  BP: 122/74  Pulse: 78  Resp: 16  Temp: 99.4 F (37.4 C)  SpO2: 97%  Weight: 233 lb 9 oz (105.9 kg)  Height: 5\' 2"  (1.575 m)    Body mass index is 42.72 kg/m.  Physical Exam  Constitutional: Patient appears well-developed and well-nourished. Obese  No distress.  HEENT: head atraumatic, normocephalic, pupils equal and reactive to light,  neck supple, throat within normal limits Cardiovascular: Normal rate, regular rhythm and normal heart sounds.  No murmur heard. No BLE edema. Pulmonary/Chest: Effort normal and breath sounds normal. No respiratory distress. Abdominal: Soft.  There is no tenderness. Psychiatric: Patient has a normal mood and affect. behavior is normal. Judgment and thought content normal.  Recent Results (from the past 2160 hour(s))  HM DIABETES EYE EXAM     Status: None   Collection Time: 06/03/16  12:00 AM  Result Value Ref Range   HM Diabetic Eye Exam No Retinopathy No Retinopathy    Comment: [facility] Dr. Cephus Shelling P.  POCT HgB A1C     Status: Abnormal   Collection Time: 07/01/16  1:29 PM  Result Value Ref Range   Hemoglobin A1C 5.9   POCT UA - Microalbumin     Status: Abnormal   Collection Time: 07/01/16  1:29 PM  Result Value Ref Range   Microalbumin Ur, POC 20 mg/L   Creatinine, POC  mg/dL   Albumin/Creatinine Ratio, Urine, POC        PHQ2/9: Depression screen [facility] 2/9 07/01/2016 03/22/2016 12/15/2015 09/15/2015 07/06/2015  Decreased Interest 0 0 0 0 0  Down, Depressed, Hopeless 0 0 0 0 0  PHQ - 2 Score 0 0 0 0 0     Fall Risk: Fall Risk  07/01/2016 03/22/2016 12/15/2015 09/15/2015 07/06/2015  Falls in the past year? Yes No No No No  Number falls in past yr: 1 - - - -  Injury with Fall? No - - - -     Functional Status Survey: Is the patient deaf or have difficulty hearing?: No Does the patient have difficulty seeing, even when wearing glasses/contacts?: No Does the patient have difficulty concentrating, remembering, or making decisions?: No Does the patient have difficulty walking or climbing stairs?: No Does the patient have difficulty dressing or bathing?: No Does the patient have difficulty doing errands alone such as visiting a doctor's office or shopping?: No    Assessment & Plan  1. Type 2 diabetes, controlled, with neuropathy (HCC)  - POCT HgB A1C - POCT UA - Microalbumin - DULoxetine (CYMBALTA) 60 MG capsule; Take 1 capsule (60 mg total) by mouth daily.  Dispense: 90 capsule; Refill: 1  2. Seronegative myasthenia gravis ([place])  Continue follow up with neurologist   3. Morbid obesity, unspecified obesity type ([place])  She needs to resume diet  4. Dyslipidemia  - atorvastatin (LIPITOR) 20 MG tablet; Take 1 tablet (20 mg total) by mouth every evening.  Dispense: 90 tablet; Refill: 1 - Lipid panel  5. Obstructive apnea  - CBC with  Differential/Platelet  6. Bariatric surgery status  - Vitamin B12  7. Chronic tension-type headache, intractable  - topiramate (TOPAMAX)  100 MG tablet; Take 1 tablet (100 mg total) by mouth at bedtime.  Dispense: 90 tablet; Refill: 1  8. Peripheral polyneuropathy (HCC)  - DULoxetine (CYMBALTA) 60 MG capsule; Take 1 capsule (60 mg total) by mouth daily.  Dispense: 90 capsule; Refill: 1  9. Perennial allergic rhinitis  - montelukast (SINGULAIR) 10 MG tablet; Take 1 tablet (10 mg total) by mouth at bedtime.  Dispense: 90 tablet; Refill: 1  10. Vitamin D deficiency  - VITAMIN D 25 Hydroxy (Vit-D Deficiency, Fractures)  11. Long-term use of high-risk medication  - COMPLETE METABOLIC PANEL WITH GFR

## 2016-07-16 DIAGNOSIS — H40002 Preglaucoma, unspecified, left eye: Secondary | ICD-10-CM | POA: Diagnosis not present

## 2016-08-27 DIAGNOSIS — H40003 Preglaucoma, unspecified, bilateral: Secondary | ICD-10-CM | POA: Diagnosis not present

## 2016-08-27 LAB — HM DIABETES EYE EXAM

## 2016-09-05 ENCOUNTER — Ambulatory Visit (INDEPENDENT_AMBULATORY_CARE_PROVIDER_SITE_OTHER): Payer: 59 | Admitting: Family Medicine

## 2016-09-05 ENCOUNTER — Other Ambulatory Visit: Payer: Self-pay | Admitting: Family Medicine

## 2016-09-05 ENCOUNTER — Encounter: Payer: Self-pay | Admitting: Family Medicine

## 2016-09-05 VITALS — BP 108/72 | HR 65 | Temp 98.0°F | Resp 16 | Ht 62.0 in | Wt 233.6 lb

## 2016-09-05 DIAGNOSIS — Z79899 Other long term (current) drug therapy: Secondary | ICD-10-CM | POA: Diagnosis not present

## 2016-09-05 DIAGNOSIS — Z01419 Encounter for gynecological examination (general) (routine) without abnormal findings: Secondary | ICD-10-CM | POA: Diagnosis not present

## 2016-09-05 DIAGNOSIS — Z9884 Bariatric surgery status: Secondary | ICD-10-CM | POA: Diagnosis not present

## 2016-09-05 DIAGNOSIS — Z1239 Encounter for other screening for malignant neoplasm of breast: Secondary | ICD-10-CM

## 2016-09-05 DIAGNOSIS — E785 Hyperlipidemia, unspecified: Secondary | ICD-10-CM | POA: Diagnosis not present

## 2016-09-05 DIAGNOSIS — Z1211 Encounter for screening for malignant neoplasm of colon: Secondary | ICD-10-CM

## 2016-09-05 DIAGNOSIS — H409 Unspecified glaucoma: Secondary | ICD-10-CM | POA: Insufficient documentation

## 2016-09-05 DIAGNOSIS — G4733 Obstructive sleep apnea (adult) (pediatric): Secondary | ICD-10-CM | POA: Diagnosis not present

## 2016-09-05 DIAGNOSIS — E559 Vitamin D deficiency, unspecified: Secondary | ICD-10-CM | POA: Diagnosis not present

## 2016-09-05 DIAGNOSIS — Z1231 Encounter for screening mammogram for malignant neoplasm of breast: Secondary | ICD-10-CM | POA: Diagnosis not present

## 2016-09-05 LAB — LIPID PANEL
Cholesterol: 174 mg/dL (ref <200)
HDL: 67 mg/dL (ref >50)
LDL CALC: 84 mg/dL (ref <100)
Total CHOL/HDL Ratio: 2.6 Ratio (ref <5.0)
Triglycerides: 116 mg/dL (ref <150)
VLDL: 23 mg/dL (ref <30)

## 2016-09-05 LAB — CBC WITH DIFFERENTIAL/PLATELET
BASOS ABS: 87 {cells}/uL (ref 0–200)
BASOS PCT: 1 %
EOS ABS: 87 {cells}/uL (ref 15–500)
Eosinophils Relative: 1 %
HEMATOCRIT: 42.2 % (ref 35.0–45.0)
Hemoglobin: 14.1 g/dL (ref 11.7–15.5)
Lymphocytes Relative: 37 %
Lymphs Abs: 3219 cells/uL (ref 850–3900)
MCH: 29.1 pg (ref 27.0–33.0)
MCHC: 33.4 g/dL (ref 32.0–36.0)
MCV: 87.2 fL (ref 80.0–100.0)
MONOS PCT: 6 %
MPV: 9.4 fL (ref 7.5–12.5)
Monocytes Absolute: 522 cells/uL (ref 200–950)
Neutro Abs: 4785 cells/uL (ref 1500–7800)
Neutrophils Relative %: 55 %
PLATELETS: 280 10*3/uL (ref 140–400)
RBC: 4.84 MIL/uL (ref 3.80–5.10)
RDW: 13.9 % (ref 11.0–15.0)
WBC: 8.7 10*3/uL (ref 3.8–10.8)

## 2016-09-05 LAB — COMPLETE METABOLIC PANEL WITH GFR
ALT: 22 U/L (ref 6–29)
AST: 19 U/L (ref 10–35)
Albumin: 4.2 g/dL (ref 3.6–5.1)
Alkaline Phosphatase: 45 U/L (ref 33–130)
BILIRUBIN TOTAL: 0.4 mg/dL (ref 0.2–1.2)
BUN: 16 mg/dL (ref 7–25)
CO2: 23 mmol/L (ref 20–31)
CREATININE: 0.57 mg/dL (ref 0.50–0.99)
Calcium: 9.4 mg/dL (ref 8.6–10.4)
Chloride: 106 mmol/L (ref 98–110)
GFR, Est Non African American: >89 mL/min (ref >=60)
Glucose, Bld: 88 mg/dL (ref 65–99)
Potassium: 4.2 mmol/L (ref 3.5–5.3)
Sodium: 141 mmol/L (ref 135–146)
TOTAL PROTEIN: 7.1 g/dL (ref 6.1–8.1)

## 2016-09-05 LAB — VITAMIN B12: VITAMIN B 12: 1161 pg/mL — AB (ref 200–1100)

## 2016-09-05 MED ORDER — CO Q 10 100 MG PO CAPS: 1.0000 | ORAL_CAPSULE | Freq: Every day | ORAL | 2 refills | Status: AC

## 2016-09-05 NOTE — Progress Notes (Signed)
Name: Jasmine Woods   MRN: [id number]    DOB: 1955/07/16   Date:09/05/2016       Progress Note  Subjective  Chief Complaint  Chief Complaint  Patient presents with  . Annual Exam    HPI  Well Woman: she is sexually active - occasionally - no vaginal discharge, no breast lumps or nipple discharge. His weight is stable. We will check labs today. She is due for colonoscopy and mammogram. She has some incontinence only with intense coughing or sneezing. No urgency or nocturia. Discussed Kegel exercise   Patient Active Problem List   Diagnosis Date Noted  . Glaucoma, left eye 09/05/2016  . Abnormal imaging of thyroid 09/15/2015  . Seronegative myasthenia gravis ([place]) 08/08/2015  . Psoriasis 07/06/2015  . Stress incontinence 07/06/2015  . Chronic tension-type headache, intractable 05/29/2015  . Anxiety and depression 04/12/2015  . Asthma, mild intermittent 04/12/2015  . Bladder polyp 04/12/2015  . Carpal tunnel syndrome 04/12/2015  . Binocular vision disorder with diplopia 04/12/2015  . Dyslipidemia 04/12/2015  . H/O: HTN (hypertension) 04/12/2015  . Hemorrhoids, internal 04/12/2015  . Neuropathy ([place]) 04/12/2015  . Nuclear sclerotic cataract 04/12/2015  . Morbid obesity ([place]) 04/12/2015  . Obstructive apnea 04/12/2015  . Perennial allergic rhinitis 04/12/2015  . 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 Type 2 diabetes, controlled, with neuropathy ([place]) 04/12/2015  . Vitamin D deficiency 04/12/2015  . Cephalalgia 08/16/2014  . Lump or mass in breast 06/14/2013  . Bariatric surgery status 10/12/2012    Past Surgical History:  Procedure Laterality Date  . ABDOMINAL HYSTERECTOMY  1991  . [place] SURGERY  2014  . COLONOSCOPY  2012  . KNEE SURGERY Right 1979-1980  . PARS PLANA VITRECTOMY Left 03/27/2016   Procedure: PARS PLANA VITRECTOMY WITH 25 GAUGE AND MEMBRANE PEEL, INTERNAL LIMITING MEMBRANE;  Surgeon: Hurman Horn, MD;  Location: [facility];  Service: Ophthalmology;  Laterality: Left;  . THYROID SURGERY     BX done was benign  . UPPER GI ENDOSCOPY    . VITRECTOMY Left 04/27/2016    Family History  Problem Relation Age of Onset  . Diabetes Mother   . Hypertension Mother   . Muscular dystrophy Sister     Social History   Social History  . Marital status: Married    Spouse name: N/A  . Number of children: N/A  . Years of education: N/A   Occupational History  . Not on file.   Social History Main Topics  . Smoking status: Former Smoker    Years: 3.00  . Smokeless tobacco: Never Used  . Alcohol use 0.0 oz/week     Comment: occasionally  . Drug use: No  . Sexual activity: Not Currently   Other Topics Concern  . Not on file   Social History Narrative  . No narrative on file     Current Outpatient Prescriptions:  .  acetaminophen (TYLENOL) 500 MG tablet, Take 1,000 mg by mouth every 6 (six) hours as needed for mild pain. Reported on 07/06/2015, Disp: , Rfl:  .  albuterol (PROVENTIL) (2.5 MG/3ML) 0.083% nebulizer solution, Take 3 mLs (2.5 mg total) by nebulization every 6 (six) hours as needed for wheezing or shortness of breath., Disp: 150 mL, Rfl: 1 .  aspirin 81 MG chewable tablet, Chew 1 tablet (81 mg total) by mouth daily., Disp: 30 tablet, Rfl: 0 .  atorvastatin (LIPITOR) 20 MG tablet, Take 1 tablet (20 mg total) by mouth every evening., Disp:  90 tablet, Rfl: 1 .  budesonide (RHINOCORT AQUA) 32 MCG/ACT nasal spray, Place 2 sprays into both nostrils daily. (Patient taking differently: Place 2 sprays into both nostrils at bedtime. ), Disp: 1 Bottle, Rfl: 5 .  Calcium Carbonate-Vit D-Min (CALTRATE 600+D PLUS PO), Take 1 tablet by mouth daily., Disp: , Rfl:  .  Coenzyme Q10 (CO Q 10) 100 MG CAPS, Take 1 capsule by mouth daily., Disp: 30 capsule, Rfl: 2 .  DULoxetine (CYMBALTA) 60 MG capsule, Take 1 capsule (60 mg total) by mouth daily., Disp: 90 capsule, Rfl: 1 .  loratadine (CLARITIN) 10 MG tablet, Take 10  mg by mouth daily., Disp: , Rfl:  .  montelukast (SINGULAIR) 10 MG tablet, Take 1 tablet (10 mg total) by mouth at bedtime., Disp: 90 tablet, Rfl: 1 .  Multiple Vitamins-Minerals (MULTIVITAMIN WITH MINERALS) tablet, Take 1 tablet by mouth daily., Disp: , Rfl:  .  Polyethyl Glycol-Propyl Glycol (SYSTANE OP), Apply 1 drop to eye as needed. Dry eyes, Disp: , Rfl:  .  pyridostigmine (MESTINON) 60 MG tablet, Take 60 mg by mouth 4 (four) times daily. , Disp: , Rfl: 4 .  topiramate (TOPAMAX) 100 MG tablet, Take 1 tablet (100 mg total) by mouth at bedtime., Disp: 90 tablet, Rfl: 1  Allergies  Allergen Reactions  . Ace Inhibitors     cough  . Amlodipine     edema  . Gabapentin     edema  . Pregabalin Other (See Comments)    anxiety     ROS  Constitutional: Negative for fever or weight change.  Respiratory: Negative for cough and shortness of breath.   Cardiovascular: Negative for chest pain or palpitations.  Gastrointestinal: Negative for abdominal pain, no bowel changes.  Musculoskeletal: Negative for gait problem or joint swelling. She has muscle aches on both upper arms and calves Skin: Negative for rash.  Neurological: Negative for dizziness or headache.  No other specific complaints in a complete review of systems (except as listed in HPI above).  Objective  Vitals:   09/05/16 0828  BP: 108/72  Pulse: 65  Resp: 16  Temp: 98 F (36.7 C)  TempSrc: Oral  SpO2: 98%  Weight: 233 lb 9.6 oz (106 kg)  Height: 5\' 2"  (1.575 m)    Body mass index is 42.73 kg/m.  Physical Exam  Constitutional: Patient appears well-developed and well-nourished. No distress.  HENT: Head: Normocephalic and atraumatic. Ears: B TMs ok, no erythema or effusion; Nose: Nose normal. Mouth/Throat: Oropharynx is clear and moist. No oropharyngeal exudate.  Eyes: Conjunctivae and EOM are normal. Pupils are equal, round, and reactive to light. No scleral icterus.  Neck: Normal range of motion. Neck supple. No  JVD present. No thyromegaly present.  Cardiovascular: Normal rate, regular rhythm and normal heart sounds.  No murmur heard. No BLE edema. Pulmonary/Chest: Effort normal and breath sounds normal. No respiratory distress. Abdominal: Soft. Bowel sounds are normal, no distension. There is no tenderness. no masses Breast: no lumps or masses, no nipple discharge or rashes FEMALE GENITALIA:  External genitalia normal External urethra normal No pelvic exam  RECTAL: not done Musculoskeletal: Normal range of motion, no joint effusions. No gross deformities Neurological: he is alert and oriented to person, place, and time. No cranial nerve deficit. Coordination, balance, strength, speech and gait are normal.  Skin: Skin is warm and dry. No rash noted. No erythema.  Psychiatric: Patient has a normal mood and affect. behavior is normal. Judgment and thought content normal.  Recent Results (from the past 2160 hour(s))  POCT HgB A1C     Status: Abnormal   Collection Time: 07/01/16  1:29 PM  Result Value Ref Range   Hemoglobin A1C 5.9   POCT UA - Microalbumin     Status: Abnormal   Collection Time: 07/01/16  1:29 PM  Result Value Ref Range   Microalbumin Ur, POC 20 mg/L   Creatinine, POC  mg/dL   Albumin/Creatinine Ratio, Urine, POC      Diabetic Foot Exam: Diabetic Foot Exam - Simple   Simple Foot Form Diabetic Foot exam was performed with the following findings:  Yes 09/05/2016  9:08 AM  Visual Inspection No deformities, no ulcerations, no other skin breakdown bilaterally:  Yes Sensation Testing Intact to touch and monofilament testing bilaterally:  Yes Pulse Check Posterior Tibialis and Dorsalis pulse intact bilaterally:  Yes Comments      PHQ2/9: Depression screen [facility] 2/9 09/05/2016 07/01/2016 03/22/2016 12/15/2015 09/15/2015  Decreased Interest 0 0 0 0 0  Down, Depressed, Hopeless 0 0 0 0 0  PHQ - 2 Score 0 0 0 0 0     Fall Risk: Fall Risk  09/05/2016 07/01/2016 03/22/2016 12/15/2015  09/15/2015  Falls in the past year? No Yes No No No  Number falls in past yr: - 1 - - -  Injury with Fall? - No - - -     Functional Status Survey: Is the patient deaf or have difficulty hearing?: No Does the patient have difficulty seeing, even when wearing glasses/contacts?: No Does the patient have difficulty concentrating, remembering, or making decisions?: No Does the patient have difficulty walking or climbing stairs?: No Does the patient have difficulty dressing or bathing?: No Does the patient have difficulty doing errands alone such as visiting a doctor's office or shopping?: No   Assessment & Plan  1. Well woman exam  Discussed importance of 150 minutes of physical activity weekly, eat two servings of fish weekly, eat one serving of tree nuts ( cashews, pistachios, pecans, almonds.Marland Kitchen) every other day, eat 6 servings of fruit/vegetables daily and drink plenty of water and avoid sweet beverages.   2. Screening for colon cancer  - Ambulatory referral to Gastroenterology  3. Breast cancer screening  - MM Digital Screening; Future

## 2016-09-06 ENCOUNTER — Encounter: Payer: Self-pay | Admitting: Family Medicine

## 2016-09-06 LAB — VITAMIN D 25 HYDROXY (VIT D DEFICIENCY, FRACTURES): VIT D 25 HYDROXY: 47 ng/mL (ref 30–100)

## 2016-09-09 ENCOUNTER — Encounter: Payer: Self-pay | Admitting: Family Medicine

## 2016-09-09 ENCOUNTER — Encounter: Payer: 59 | Admitting: Family Medicine

## 2016-09-09 DIAGNOSIS — H35372 Puckering of macula, left eye: Secondary | ICD-10-CM | POA: Insufficient documentation

## 2016-09-11 ENCOUNTER — Encounter: Payer: Self-pay | Admitting: Family Medicine

## 2016-09-12 ENCOUNTER — Other Ambulatory Visit: Payer: Self-pay | Admitting: Family Medicine

## 2016-09-12 ENCOUNTER — Telehealth: Payer: Self-pay

## 2016-09-12 MED ORDER — GLUCOSE BLOOD VI STRP: ORAL_STRIP | 12 refills | Status: DC

## 2016-09-12 NOTE — Telephone Encounter (Signed)
Pt has changed blood glucose meter to [facility]u Check and she is needing a new script for testing strips sent to  [facility]. Only have enough to last through tomorrow.

## 2016-09-12 NOTE — Telephone Encounter (Signed)
Left vm for pt to return my call to schedule colonoscopy. 

## 2016-09-13 ENCOUNTER — Other Ambulatory Visit: Payer: Self-pay

## 2016-09-13 ENCOUNTER — Other Ambulatory Visit: Payer: Self-pay | Admitting: Family Medicine

## 2016-09-13 DIAGNOSIS — Z1211 Encounter for screening for malignant neoplasm of colon: Secondary | ICD-10-CM

## 2016-09-13 MED ORDER — GLUCOSE BLOOD VI STRP: ORAL_STRIP | 12 refills | Status: DC

## 2016-09-13 NOTE — Telephone Encounter (Signed)
Gastroenterology Pre-Procedure Review  Request Date: 09/30/16 Requesting Physician: Dr. Allen Norris  PATIENT REVIEW QUESTIONS: The patient responded to the following health history questions as indicated:    1. Are you having any GI issues? no 2. Do you have a personal history of Polyps? no 3. Do you have a family history of Colon Cancer or Polyps? no 4. Diabetes Mellitus? yes (diet controlled Type II) 5. Joint replacements in the past 12 months?no 6. Major health problems in the past 3 months? Myestenia Gravis x 1 year. 7. Any artificial heart valves, MVP, or defibrillator?no    MEDICATIONS & ALLERGIES:    Patient reports the following regarding taking any anticoagulation/antiplatelet therapy:   Plavix, Coumadin, Eliquis, Xarelto, Lovenox, Pradaxa, Brilinta, or Effient? no Aspirin? No  Patient confirms/reports the following medications:  Current Outpatient Prescriptions  Medication Sig Dispense Refill  . acetaminophen (TYLENOL) 500 MG tablet Take 1,000 mg by mouth every 6 (six) hours as needed for mild pain. Reported on 07/06/2015    . albuterol (PROVENTIL) (2.5 MG/3ML) 0.083% nebulizer solution Take 3 mLs (2.5 mg total) by nebulization every 6 (six) hours as needed for wheezing or shortness of breath. 150 mL 1  . aspirin 81 MG chewable tablet Chew 1 tablet (81 mg total) by mouth daily. 30 tablet 0  . atorvastatin (LIPITOR) 20 MG tablet Take 1 tablet (20 mg total) by mouth every evening. 90 tablet 1  . brimonidine (ALPHAGAN P) 0.1 % SOLN Place 1 drop into the left eye every 8 (eight) hours.    . budesonide (RHINOCORT AQUA) 32 MCG/ACT nasal spray Place 2 sprays into both nostrils daily. (Patient taking differently: Place 2 sprays into both nostrils at bedtime. ) 1 Bottle 5  . Calcium Carbonate-Vit D-Min (CALTRATE 600+D PLUS PO) Take 1 tablet by mouth daily.    . Coenzyme Q10 (CO Q 10) 100 MG CAPS Take 1 capsule by mouth daily. 30 capsule 2  . DULoxetine (CYMBALTA) 60 MG capsule Take 1 capsule  (60 mg total) by mouth daily. 90 capsule 1  . glucose blood (ACCU-CHEK GUIDE) test strip Use as instructed 100 each 12  . loratadine (CLARITIN) 10 MG tablet Take 10 mg by mouth daily.    . montelukast (SINGULAIR) 10 MG tablet Take 1 tablet (10 mg total) by mouth at bedtime. 90 tablet 1  . Multiple Vitamins-Minerals (MULTIVITAMIN WITH MINERALS) tablet Take 1 tablet by mouth daily.    . Omega-3 Fatty Acids (FISH OIL) 1200 MG CAPS Take 2 capsules by mouth daily.    Vladimir Faster Glycol-Propyl Glycol (SYSTANE OP) Apply 1 drop to eye as needed. Dry eyes    . pyridostigmine (MESTINON) 60 MG tablet Take 60 mg by mouth 4 (four) times daily.   4  . topiramate (TOPAMAX) 100 MG tablet Take 1 tablet (100 mg total) by mouth at bedtime. 90 tablet 1   No current facility-administered medications for this visit.     Patient confirms/reports the following allergies:  Allergies  Allergen Reactions  . Ace Inhibitors     cough  . Amlodipine     edema  . Gabapentin     edema  . Pregabalin Other (See Comments)    anxiety    No orders of the defined types were placed in this encounter.   AUTHORIZATION INFORMATION Primary Insurance: 1D#: Group #:  Secondary Insurance: 1D#: Group #:  SCHEDULE INFORMATION: Date: 09/30/16 Time: Location: [facility]

## 2016-09-17 DIAGNOSIS — H35372 Puckering of macula, left eye: Secondary | ICD-10-CM | POA: Diagnosis not present

## 2016-09-17 DIAGNOSIS — H43811 Vitreous degeneration, right eye: Secondary | ICD-10-CM | POA: Diagnosis not present

## 2016-09-17 DIAGNOSIS — H2512 Age-related nuclear cataract, left eye: Secondary | ICD-10-CM | POA: Diagnosis not present

## 2016-09-17 DIAGNOSIS — H2511 Age-related nuclear cataract, right eye: Secondary | ICD-10-CM | POA: Diagnosis not present

## 2016-09-24 ENCOUNTER — Encounter: Payer: Self-pay | Admitting: *Deleted

## 2016-09-25 ENCOUNTER — Other Ambulatory Visit: Payer: Self-pay | Admitting: Family Medicine

## 2016-09-27 NOTE — Discharge Instructions (Signed)

## 2016-09-30 ENCOUNTER — Ambulatory Visit
Admission: RE | Admit: 2016-09-30 | Discharge: 2016-09-30 | Disposition: A | Discharge status: Home / Self Care | Payer: 59 | Source: Ambulatory Visit | Attending: Gastroenterology | Admitting: Gastroenterology

## 2016-09-30 ENCOUNTER — Ambulatory Visit: Payer: 59 | Admitting: Anesthesiology

## 2016-09-30 ENCOUNTER — Ambulatory Visit: Payer: 59 | Admitting: Family Medicine

## 2016-09-30 ENCOUNTER — Encounter
Admission: RE | Disposition: A | Discharge status: Home / Self Care | Payer: Self-pay | Source: Ambulatory Visit | Attending: Gastroenterology

## 2016-09-30 DIAGNOSIS — E559 Vitamin D deficiency, unspecified: Secondary | ICD-10-CM | POA: Diagnosis not present

## 2016-09-30 DIAGNOSIS — E1142 Type 2 diabetes mellitus with diabetic polyneuropathy: Secondary | ICD-10-CM | POA: Diagnosis not present

## 2016-09-30 DIAGNOSIS — Z8249 Family history of ischemic heart disease and other diseases of the circulatory system: Secondary | ICD-10-CM | POA: Diagnosis not present

## 2016-09-30 DIAGNOSIS — J45909 Unspecified asthma, uncomplicated: Secondary | ICD-10-CM | POA: Insufficient documentation

## 2016-09-30 DIAGNOSIS — Z1211 Encounter for screening for malignant neoplasm of colon: Secondary | ICD-10-CM | POA: Diagnosis not present

## 2016-09-30 DIAGNOSIS — I10 Essential (primary) hypertension: Secondary | ICD-10-CM | POA: Insufficient documentation

## 2016-09-30 DIAGNOSIS — G5600 Carpal tunnel syndrome, unspecified upper limb: Secondary | ICD-10-CM | POA: Insufficient documentation

## 2016-09-30 DIAGNOSIS — Z6841 Body Mass Index (BMI) 40.0 and over, adult: Secondary | ICD-10-CM | POA: Insufficient documentation

## 2016-09-30 DIAGNOSIS — Z888 Allergy status to other drugs, medicaments and biological substances status: Secondary | ICD-10-CM | POA: Insufficient documentation

## 2016-09-30 DIAGNOSIS — Z9884 Bariatric surgery status: Secondary | ICD-10-CM | POA: Insufficient documentation

## 2016-09-30 DIAGNOSIS — Z87891 Personal history of nicotine dependence: Secondary | ICD-10-CM | POA: Insufficient documentation

## 2016-09-30 DIAGNOSIS — Z7951 Long term (current) use of inhaled steroids: Secondary | ICD-10-CM | POA: Insufficient documentation

## 2016-09-30 DIAGNOSIS — E669 Obesity, unspecified: Secondary | ICD-10-CM | POA: Insufficient documentation

## 2016-09-30 DIAGNOSIS — Z79899 Other long term (current) drug therapy: Secondary | ICD-10-CM | POA: Insufficient documentation

## 2016-09-30 DIAGNOSIS — Z820 Family history of epilepsy and other diseases of the nervous system: Secondary | ICD-10-CM | POA: Insufficient documentation

## 2016-09-30 DIAGNOSIS — E1144 Type 2 diabetes mellitus with diabetic amyotrophy: Secondary | ICD-10-CM | POA: Diagnosis not present

## 2016-09-30 DIAGNOSIS — G7000 Myasthenia gravis without (acute) exacerbation: Secondary | ICD-10-CM | POA: Diagnosis not present

## 2016-09-30 DIAGNOSIS — E785 Hyperlipidemia, unspecified: Secondary | ICD-10-CM | POA: Insufficient documentation

## 2016-09-30 DIAGNOSIS — G4733 Obstructive sleep apnea (adult) (pediatric): Secondary | ICD-10-CM | POA: Diagnosis not present

## 2016-09-30 HISTORY — PX: COLONOSCOPY WITH PROPOFOL: SHX5780

## 2016-09-30 HISTORY — DX: Other specified health status: Z78.9

## 2016-09-30 HISTORY — DX: Reserved for inherently not codable concepts without codable children: IMO0001

## 2016-09-30 HISTORY — DX: Nontoxic single thyroid nodule: E04.1

## 2016-09-30 LAB — GLUCOSE, CAPILLARY
Glucose-Capillary: 101 mg/dL — ABNORMAL HIGH (ref 65–99)
Glucose-Capillary: 87 mg/dL (ref 65–99)

## 2016-09-30 SURGERY — COLONOSCOPY WITH PROPOFOL
Anesthesia: Monitor Anesthesia Care | Wound class: Contaminated

## 2016-09-30 MED ORDER — ACETAMINOPHEN 325 MG PO TABS: 325.0000 mg | ORAL_TABLET | ORAL | Status: DC | PRN

## 2016-09-30 MED ORDER — SIMETHICONE 40 MG/0.6ML PO SUSP
ORAL | Status: DC | PRN
  Administered 2016-09-30: 08:00:00

## 2016-09-30 MED ORDER — GLYCOPYRROLATE 0.2 MG/ML IJ SOLN
INTRAMUSCULAR | Status: DC | PRN
  Administered 2016-09-30: 0.2 mg via INTRAVENOUS

## 2016-09-30 MED ORDER — ONDANSETRON HCL 4 MG/2ML IJ SOLN
4.0000 mg | Freq: Once | INTRAMUSCULAR | Status: AC
  Administered 2016-09-30: 2 mg via INTRAVENOUS

## 2016-09-30 MED ORDER — LACTATED RINGERS IV SOLN
INTRAVENOUS | Status: DC
  Administered 2016-09-30: 08:00:00 via INTRAVENOUS

## 2016-09-30 MED ORDER — ACETAMINOPHEN 160 MG/5ML PO SOLN: 325.0000 mg | ORAL | Status: DC | PRN

## 2016-09-30 MED ORDER — PROPOFOL 10 MG/ML IV BOLUS
INTRAVENOUS | Status: DC | PRN
  Administered 2016-09-30 (×2): 50 mg via INTRAVENOUS
  Administered 2016-09-30: 20 mg via INTRAVENOUS
  Administered 2016-09-30: 50 mg via INTRAVENOUS
  Administered 2016-09-30: 100 mg via INTRAVENOUS

## 2016-09-30 MED ORDER — LIDOCAINE HCL (CARDIAC) 20 MG/ML IV SOLN
INTRAVENOUS | Status: DC | PRN
Start: 2016-09-30 — End: 2016-09-30
  Administered 2016-09-30: 40 mg via INTRAVENOUS

## 2016-09-30 SURGICAL SUPPLY — 23 items

## 2016-09-30 NOTE — Anesthesia Postprocedure Evaluation (Signed)
Anesthesia Post Note  Patient: Jasmine Woods  Procedure(s) Performed: Procedure(s) (LRB): COLONOSCOPY WITH PROPOFOL (N/A)  Patient location during evaluation: PACU Anesthesia Type: MAC Level of consciousness: awake and alert and oriented Pain management: satisfactory to patient Vital Signs Assessment: post-procedure vital signs reviewed and stable Respiratory status: spontaneous breathing, nonlabored ventilation and respiratory function stable Cardiovascular status: blood pressure returned to baseline and stable Postop Assessment: Adequate PO intake and No signs of nausea or vomiting Anesthetic complications: no    Raliegh Ip

## 2016-09-30 NOTE — H&P (Signed)
Lucilla Lame, MD [address] [place], [place] [address] Phone: [phone] Fax : [phone]  Primary Care Physician:  Loistine Chance, MD Primary Gastroenterologist:  Dr. Allen Norris  Pre-Procedure History & Physical: HPI:  Jasmine Woods is a 61 y.o. female is here for a screening colonoscopy.   Past Medical History:  Diagnosis Date  . Allergy   . Anxiety   . Asthma   . Bladder polyp    Dr. Ernst Spell  . Carpal tunnel syndrome   . Depression   . Diabetes mellitus without complication ([place])    Diet controlled  . Double vision    seen by Dr. 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 Dyslipidemia   . Frequency of urination   . HA (headache)   . Heart murmur    followed by PCP  . Hx of bariatric surgery   . Hypertension    no meds  . Internal hemorrhoids   . Myasthenia gravis ([place])    "mostly effects eyes"  . Neuropathy, generalized ([facility])    diabetic on feet  . Nuclear sclerotic cataract of both eyes   . Obesity (BMI 30-39.9)   . Obstructive sleep apnea on CPAP   . Patient is Jehovah's Witness    no blood products  . Rotator cuff tendonitis 07/2009   also has impingment right shoulder pain, seen by Dr. Mack Guise  . Sleep apnea   . Thyroid nodule   . Vaginal dryness, menopausal   . Vitamin D deficiency     Past Surgical History:  Procedure Laterality Date  . ABDOMINAL HYSTERECTOMY  1991  . [place] SURGERY  2014  . COLONOSCOPY  2012  . KNEE SURGERY Right 1979-1980  . PARS PLANA VITRECTOMY Left 03/27/2016   Procedure: PARS PLANA VITRECTOMY WITH 25 GAUGE AND MEMBRANE PEEL, INTERNAL LIMITING MEMBRANE;  Surgeon: Hurman Horn, MD;  Location: [facility];  Service: Ophthalmology;  Laterality: Left;  . THYROID SURGERY     BX done was benign  . UPPER GI ENDOSCOPY    . VITRECTOMY Left 04/27/2016    Prior to Admission medications   Medication Sig Start Date End Date Taking? Authorizing Provider  acetaminophen (TYLENOL) 500 MG tablet Take 1,000 mg  by mouth every 6 (six) hours as needed for mild pain. Reported on 07/06/2015   Yes Historical Provider, MD  albuterol (PROVENTIL) (2.5 MG/3ML) 0.083% nebulizer solution Take 3 mLs (2.5 mg total) by nebulization every 6 (six) hours as needed for wheezing or shortness of breath. 07/06/15  Yes Steele Sizer, MD  atorvastatin (LIPITOR) 20 MG tablet Take 1 tablet (20 mg total) by mouth every evening. 07/01/16  Yes Steele Sizer, MD  brimonidine (ALPHAGAN P) 0.1 % SOLN Place 1 drop into the left eye every 8 (eight) hours.   Yes Historical Provider, MD  budesonide (RHINOCORT AQUA) 32 MCG/ACT nasal spray Place 2 sprays into both nostrils daily. Patient taking differently: Place 2 sprays into both nostrils at bedtime.  07/06/15  Yes Steele Sizer, MD  Calcium Carbonate-Vit D-Min (CALTRATE 600+D PLUS PO) Take 1 tablet by mouth daily.   Yes Historical Provider, MD  Coenzyme Q10 (CO Q 10) 100 MG CAPS Take 1 capsule by mouth daily. 09/05/16  Yes Steele Sizer, MD  DULoxetine (CYMBALTA) 60 MG capsule Take 1 capsule (60 mg total) by mouth daily. 07/01/16  Yes Steele Sizer, MD  glucose blood (ACCU-CHEK GUIDE) test strip Use as instructed 09/13/16  Yes Steele Sizer, MD  loratadine (CLARITIN) 10 MG tablet  Take 10 mg by mouth daily.   Yes Historical Provider, MD  montelukast (SINGULAIR) 10 MG tablet Take 1 tablet (10 mg total) by mouth at bedtime. 07/01/16  Yes Steele Sizer, MD  Multiple Vitamins-Minerals (MULTIVITAMIN WITH MINERALS) tablet Take 1 tablet by mouth daily.   Yes Historical Provider, MD  Omega-3 Fatty Acids (FISH OIL) 1200 MG CAPS Take 2 capsules by mouth daily.   Yes Historical Provider, MD  Polyethyl Glycol-Propyl Glycol (SYSTANE OP) Apply 1 drop to eye as needed. Dry eyes   Yes Historical Provider, MD  pyridostigmine (MESTINON) 60 MG tablet Take 60 mg by mouth 4 (four) times daily.  09/05/15  Yes Historical Provider, MD  topiramate (TOPAMAX) 100 MG tablet Take 1 tablet (100 mg total) by mouth at bedtime.  07/01/16  Yes Steele Sizer, MD  VENTOLIN HFA 108 (90 Base) MCG/ACT inhaler INHALE 2 PUFFS BY MOUTH EVERY 4 HOURS AS NEEDED SHORTNESS OF BREATH/WHEEZING 09/26/16  Yes Steele Sizer, MD    Allergies as of 09/13/2016 - Review Complete 09/05/2016  Allergen Reaction Noted  . Ace inhibitors  04/11/2015  . Amlodipine  04/11/2015  . Gabapentin  04/11/2015  . Pregabalin Other (See Comments) 04/11/2015    Family History  Problem Relation Age of Onset  . Diabetes Mother   . Hypertension Mother   . Muscular dystrophy Sister     Social History   Social History  . Marital status: Married    Spouse name: N/A  . Number of children: N/A  . Years of education: N/A   Occupational History  . Not on file.   Social History Main Topics  . Smoking status: Former Smoker    Years: 3.00  . Smokeless tobacco: Never Used     Comment: smoked as teenager  . Alcohol use 0.0 oz/week     Comment: occasionally/coupletimes per year  . Drug use: No  . Sexual activity: Not Currently   Other Topics Concern  . Not on file   Social History Narrative  . No narrative on file    Review of Systems: See HPI, otherwise negative ROS  Physical Exam: BP 133/71   Pulse (!) 57   Temp 97.2 F (36.2 C) (Temporal)   Resp 16   Ht 5\' 2"  (1.575 m)   Wt 234 lb (106.1 kg)   SpO2 98%   BMI 42.80 kg/m  General:   Alert,  pleasant and cooperative in NAD Head:  Normocephalic and atraumatic. Neck:  Supple; no masses or thyromegaly. Lungs:  Clear throughout to auscultation.    Heart:  Regular rate and rhythm. Abdomen:  Soft, nontender and nondistended. Normal bowel sounds, without guarding, and without rebound.   Neurologic:  Alert and  oriented x4;  grossly normal neurologically.  Impression/Plan: Jasmine Woods is now here to undergo a screening colonoscopy.  Risks, benefits, and alternatives regarding colonoscopy have been reviewed with the patient.  Questions have been answered.  All parties  agreeable.

## 2016-09-30 NOTE — Anesthesia Preprocedure Evaluation (Signed)
Anesthesia Evaluation  Patient identified by MRN, date of birth, ID band Patient awake    Reviewed: Allergy & Precautions, H&P , NPO status , Patient's Chart, lab work & pertinent test results  Airway Mallampati: II  TM Distance: >3 FB Neck ROM: full    Dental no notable dental hx.    Pulmonary asthma , sleep apnea , former smoker,    Pulmonary exam normal        Cardiovascular hypertension, Normal cardiovascular exam     Neuro/Psych  Headaches, PSYCHIATRIC DISORDERS  Neuromuscular disease    GI/Hepatic   Endo/Other  diabetes  Renal/GU      Musculoskeletal   Abdominal   Peds  Hematology   Anesthesia Other Findings   Reproductive/Obstetrics                             Anesthesia Physical Anesthesia Plan  ASA: III  Anesthesia Plan: MAC   Post-op Pain Management:    Induction:   Airway Management Planned:   Additional Equipment:   Intra-op Plan:   Post-operative Plan:   Informed Consent: I have reviewed the patients History and Physical, chart, labs and discussed the procedure including the risks, benefits and alternatives for the proposed anesthesia with the patient or authorized representative who has indicated his/her understanding and acceptance.     Plan Discussed with:   Anesthesia Plan Comments:         Anesthesia Quick Evaluation

## 2016-09-30 NOTE — Anesthesia Procedure Notes (Signed)
Procedure Name: MAC Date/Time: 09/30/2016 7:59 AM Performed by: Janna Arch Pre-anesthesia Checklist: Patient identified, Emergency Drugs available, Suction available and Patient being monitored Patient Re-evaluated:Patient Re-evaluated prior to inductionOxygen Delivery Method: Nasal cannula

## 2016-09-30 NOTE — Transfer of Care (Signed)
Immediate Anesthesia Transfer of Care Note  Patient: Jasmine Woods  Procedure(s) Performed: Procedure(s) with comments: COLONOSCOPY WITH PROPOFOL (N/A) - diabetic - diet controlled sleep apnea  Patient Location: PACU  Anesthesia Type: MAC  Level of Consciousness: awake, alert  and patient cooperative  Airway and Oxygen Therapy: Patient Spontanous Breathing and Patient connected to supplemental oxygen  Post-op Assessment: Post-op Vital signs reviewed, Patient's Cardiovascular Status Stable, Respiratory Function Stable, Patent Airway and No signs of Nausea or vomiting  Post-op Vital Signs: Reviewed and stable  Complications: No apparent anesthesia complications

## 2016-09-30 NOTE — Op Note (Signed)
[facility] Gastroenterology Patient Name: Jasmine Woods Procedure Date: 09/30/2016 7:28 AM MRN: [id number] Account #: [id number] Date of Birth: 23-May-1956 Admit Type: Outpatient Age: 61 Room: [facility] OR ROOM 01 Gender: Female Note Status: Finalized Procedure:            Colonoscopy Indications:          Screening for colorectal malignant neoplasm Providers:            Lucilla Lame MD, MD Referring MD:         Bethena Roys. Sowles, MD (Referring MD) Medicines:            Propofol per Anesthesia Complications:        No immediate complications. Procedure:            Pre-Anesthesia Assessment:                       - Prior to the procedure, a History and Physical was                        performed, and patient medications and allergies were                        reviewed. The patient's tolerance of previous                        anesthesia was also reviewed. The risks and benefits of                        the procedure and the sedation options and risks were                        discussed with the patient. All questions were                        answered, and informed consent was obtained. Prior                        Anticoagulants: The patient has taken no previous                        anticoagulant or antiplatelet agents. ASA Grade                        Assessment: II - A patient with mild systemic disease.                        After reviewing the risks and benefits, the patient was                        deemed in satisfactory condition to undergo the                        procedure.                       After obtaining informed consent, the colonoscope was                        passed under direct vision. Throughout the procedure,  the patient's blood pressure, pulse, and oxygen                        saturations were monitored continuously. The Olympus                        Colonoscope [id number]) was introduced through  the                        anus and advanced to the the cecum, identified by                        appendiceal orifice and ileocecal valve. The                        colonoscopy was performed without difficulty. The                        patient tolerated the procedure well. The quality of                        the bowel preparation was excellent. Findings:      The perianal and digital rectal examinations were normal.      The colon (entire examined portion) appeared normal. Impression:           - The entire examined colon is normal.                       - No specimens collected. Recommendation:       - Discharge patient to home.                       - Resume previous diet.                       - Continue present medications.                       - Repeat colonoscopy in 10 years for screening unless                        any change in family history or lower GI problems. Procedure Code(s):    --- Professional ---                       [phone], Colonoscopy, flexible; diagnostic, including                        collection of specimen(s) by brushing or washing, when                        performed (separate procedure) Diagnosis Code(s):    --- Professional ---                       Z12.11, Encounter for screening for malignant neoplasm                        of colon CPT copyright 2016 [employer]. All rights reserved. The codes documented in this report are preliminary and upon coder review may  be revised to meet current compliance requirements. Lucilla Lame MD,  MD 09/30/2016 8:18:33 AM This report has been signed electronically. Number of Addenda: 0 Note Initiated On: 09/30/2016 7:28 AM Scope Withdrawal Time: 0 hours 7 minutes 30 seconds  Total Procedure Duration: 0 hours 12 minutes 32 seconds       [facility]

## 2016-10-01 ENCOUNTER — Encounter: Payer: Self-pay | Admitting: Gastroenterology

## 2016-10-08 ENCOUNTER — Other Ambulatory Visit: Payer: Self-pay

## 2016-10-09 ENCOUNTER — Ambulatory Visit
Admission: RE | Admit: 2016-10-09 | Discharge: 2016-10-09 | Disposition: A | Discharge status: Home / Self Care | Payer: 59 | Source: Ambulatory Visit | Attending: Family Medicine | Admitting: Family Medicine

## 2016-10-09 DIAGNOSIS — Z1231 Encounter for screening mammogram for malignant neoplasm of breast: Secondary | ICD-10-CM | POA: Diagnosis not present

## 2016-10-09 DIAGNOSIS — Z1239 Encounter for other screening for malignant neoplasm of breast: Secondary | ICD-10-CM

## 2016-10-10 ENCOUNTER — Ambulatory Visit: Payer: Self-pay | Admitting: Gastroenterology

## 2016-10-17 DIAGNOSIS — E041 Nontoxic single thyroid nodule: Secondary | ICD-10-CM | POA: Diagnosis not present

## 2016-10-23 ENCOUNTER — Encounter: Payer: Self-pay | Admitting: Family Medicine

## 2016-10-23 ENCOUNTER — Ambulatory Visit (INDEPENDENT_AMBULATORY_CARE_PROVIDER_SITE_OTHER): Payer: 59 | Admitting: Family Medicine

## 2016-10-23 VITALS — BP 128/68 | HR 67 | Temp 98.8°F | Resp 16 | Ht 62.0 in | Wt 235.4 lb

## 2016-10-23 DIAGNOSIS — G44221 Chronic tension-type headache, intractable: Secondary | ICD-10-CM | POA: Diagnosis not present

## 2016-10-23 DIAGNOSIS — J3089 Other allergic rhinitis: Secondary | ICD-10-CM

## 2016-10-23 DIAGNOSIS — G7000 Myasthenia gravis without (acute) exacerbation: Secondary | ICD-10-CM

## 2016-10-23 DIAGNOSIS — G629 Polyneuropathy, unspecified: Secondary | ICD-10-CM | POA: Diagnosis not present

## 2016-10-23 DIAGNOSIS — E1140 Type 2 diabetes mellitus with diabetic neuropathy, unspecified: Secondary | ICD-10-CM

## 2016-10-23 DIAGNOSIS — E785 Hyperlipidemia, unspecified: Secondary | ICD-10-CM | POA: Diagnosis not present

## 2016-10-23 DIAGNOSIS — J4520 Mild intermittent asthma, uncomplicated: Secondary | ICD-10-CM

## 2016-10-23 DIAGNOSIS — Z9884 Bariatric surgery status: Secondary | ICD-10-CM

## 2016-10-23 LAB — POCT GLYCOSYLATED HEMOGLOBIN (HGB A1C): HEMOGLOBIN A1C: 5.7

## 2016-10-23 MED ORDER — LORATADINE 10 MG PO TABS: 10.0000 mg | ORAL_TABLET | Freq: Two times a day (BID) | ORAL | 0 refills | Status: DC

## 2016-10-23 MED ORDER — MONTELUKAST SODIUM 10 MG PO TABS: 10.0000 mg | ORAL_TABLET | Freq: Every day | ORAL | 1 refills | Status: DC

## 2016-10-23 MED ORDER — ATORVASTATIN CALCIUM 20 MG PO TABS: 20.0000 mg | ORAL_TABLET | Freq: Every evening | ORAL | 1 refills | Status: DC

## 2016-10-23 MED ORDER — ROSUVASTATIN CALCIUM 5 MG PO TABS: 5.0000 mg | ORAL_TABLET | Freq: Every day | ORAL | 1 refills | Status: DC

## 2016-10-23 MED ORDER — TOPIRAMATE 100 MG PO TABS: 100.0000 mg | ORAL_TABLET | Freq: Every day | ORAL | 1 refills | Status: DC

## 2016-10-23 MED ORDER — DULOXETINE HCL 60 MG PO CPEP: 60.0000 mg | ORAL_CAPSULE | Freq: Every day | ORAL | 1 refills | Status: DC

## 2016-10-23 NOTE — Progress Notes (Signed)
Name: Jasmine Woods   MRN: [id number]    DOB: April 27, 1956   Date:10/23/2016       Progress Note  Subjective  Chief Complaint  Chief Complaint  Patient presents with  . Diabetes    6 week follow up avg 90-130 checks twice daily  . Hypertension    HPI  DMII: she is on diet only since bariatric surgery in 2014, she denies polyphagia, polydipsia or polyuria. She has been more active, walking at least 5000 steps daily. hgbA1C from  5.6% to 5.9% and now is down to 5.7% . Neuropathy is still 2/10, constant, burning like on both feet, she is on Cymbalta and seems to improve symptoms, she could not tolerate it.  Hyperlipidemia: taking Atorvastatin 20 mg daily , taking Co-Q 10 and has not noticed improvement of muscle aches, we will switch to Crestor, no chest pain or SOB. Last is at goal.   Asthma: she is feeling well, no SOB, no wheezing, currently on singulair at night and Ventolin prn- she had to use it a couple of times recently from allergies.   Myasthenia Gravis: diagnosed at [facility] by Dr. Burnett Harry, 09/2015, seronegative, only causing ptosis and double vision, no other symptoms. She had a negative chest CT for thymoma, and has started on medication ( Mestinon ), she states it works quickly but wears out very fast. She takes before driving and for work.   Chronic Tension Headaches: seen by Dr. Melrose Nakayama, taking Topamax 100 mg daily. Currently doing well, no longer needs Fioricet and is doing well now. No nausea, vomiting, photophobia, phonophobia   Abnormal CT thyroid: incidental finding of thyroid calcification of left lobe, she saw Endo and had negative biopsy  Bariatric Surgery/Obesity: she had bariatric surgery back in 2014 , her weight was almost 300 lbs, went down to close to 200lbs, but is gradually gaining weight, she has resume activity, but still eating out on a regular basis.   OSA: she is wearing CPAP every night, all night, she does not wake up with headaches, and feels  good during the day    Patient Active Problem List   Diagnosis Date Noted  . Special screening for malignant neoplasms, colon   . Epiretinal membrane (ERM) of left eye 09/09/2016  . Glaucoma, left eye 09/05/2016  . Abnormal imaging of thyroid 09/15/2015  . Seronegative myasthenia gravis ([place]) 08/08/2015  . Psoriasis 07/06/2015  . Stress incontinence 07/06/2015  . Chronic tension-type headache, intractable 05/29/2015  . Anxiety and depression 04/12/2015  . Asthma, mild intermittent 04/12/2015  . Bladder polyp 04/12/2015  . Carpal tunnel syndrome 04/12/2015  . Binocular vision disorder with diplopia 04/12/2015  . Dyslipidemia 04/12/2015  . H/O: HTN (hypertension) 04/12/2015  . Hemorrhoids, internal 04/12/2015  . Neuropathy 04/12/2015  . Nuclear sclerotic cataract 04/12/2015  . Morbid obesity ([place]) 04/12/2015  . Obstructive apnea 04/12/2015  . Perennial allergic rhinitis 04/12/2015  . 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 Type 2 diabetes, controlled, with neuropathy ([place]) 04/12/2015  . Vitamin D deficiency 04/12/2015  . Cephalalgia 08/16/2014  . Lump or mass in breast 06/14/2013  . Bariatric surgery status 10/12/2012    Past Surgical History:  Procedure Laterality Date  . ABDOMINAL HYSTERECTOMY  1991  . [address] SURGERY  2014  . COLONOSCOPY  2012  . COLONOSCOPY WITH PROPOFOL N/A 09/30/2016   Procedure: COLONOSCOPY WITH PROPOFOL;  Surgeon: Lucilla Lame, MD;  Location: [facility];  Service: Endoscopy;  Laterality: N/A;  diabetic - diet  controlled sleep apnea  . KNEE SURGERY Right 1979-1980  . PARS PLANA VITRECTOMY Left 03/27/2016   Procedure: PARS PLANA VITRECTOMY WITH 25 GAUGE AND MEMBRANE PEEL, INTERNAL LIMITING MEMBRANE;  Surgeon: Hurman Horn, MD;  Location: [facility];  Service: Ophthalmology;  Laterality: Left;  . THYROID SURGERY     BX done was benign  . UPPER GI ENDOSCOPY    . VITRECTOMY Left 04/27/2016    Family History  Problem  Relation Age of Onset  . Diabetes Mother   . Hypertension Mother   . Muscular dystrophy Sister   . Breast cancer Neg Hx     Social History   Social History  . Marital status: Married    Spouse name: N/A  . Number of children: N/A  . Years of education: N/A   Occupational History  . Not on file.   Social History Main Topics  . Smoking status: Former Smoker    Years: 3.00  . Smokeless tobacco: Never Used     Comment: smoked as teenager  . Alcohol use 0.0 oz/week     Comment: occasionally/coupletimes per year  . Drug use: No  . Sexual activity: Not Currently   Other Topics Concern  . Not on file   Social History Narrative  . No narrative on file     Current Outpatient Prescriptions:  .  acetaminophen (TYLENOL) 500 MG tablet, Take 1,000 mg by mouth every 6 (six) hours as needed for mild pain. Reported on 07/06/2015, Disp: , Rfl:  .  albuterol (PROVENTIL) (2.5 MG/3ML) 0.083% nebulizer solution, Take 3 mLs (2.5 mg total) by nebulization every 6 (six) hours as needed for wheezing or shortness of breath., Disp: 150 mL, Rfl: 1 .  atorvastatin (LIPITOR) 20 MG tablet, Take 1 tablet (20 mg total) by mouth every evening., Disp: 90 tablet, Rfl: 1 .  brimonidine (ALPHAGAN P) 0.1 % SOLN, Place 1 drop into the left eye every 8 (eight) hours., Disp: , Rfl:  .  budesonide (RHINOCORT AQUA) 32 MCG/ACT nasal spray, Place 2 sprays into both nostrils daily. (Patient taking differently: Place 2 sprays into both nostrils at bedtime. ), Disp: 1 Bottle, Rfl: 5 .  Calcium Carbonate-Vit D-Min (CALTRATE 600+D PLUS PO), Take 1 tablet by mouth daily., Disp: , Rfl:  .  Coenzyme Q10 (CO Q 10) 100 MG CAPS, Take 1 capsule by mouth daily., Disp: 30 capsule, Rfl: 2 .  DULoxetine (CYMBALTA) 60 MG capsule, Take 1 capsule (60 mg total) by mouth daily., Disp: 90 capsule, Rfl: 1 .  glucose blood (ACCU-CHEK GUIDE) test strip, Use as instructed, Disp: 100 each, Rfl: 12 .  loratadine (CLARITIN) 10 MG tablet, Take 1  tablet (10 mg total) by mouth 2 (two) times daily., Disp: 60 tablet, Rfl: 0 .  montelukast (SINGULAIR) 10 MG tablet, Take 1 tablet (10 mg total) by mouth at bedtime., Disp: 90 tablet, Rfl: 1 .  Multiple Vitamins-Minerals (MULTIVITAMIN WITH MINERALS) tablet, Take 1 tablet by mouth daily., Disp: , Rfl:  .  Omega-3 Fatty Acids (FISH OIL) 1200 MG CAPS, Take 2 capsules by mouth daily., Disp: , Rfl:  .  Polyethyl Glycol-Propyl Glycol (SYSTANE OP), Apply 1 drop to eye as needed. Dry eyes, Disp: , Rfl:  .  pyridostigmine (MESTINON) 60 MG tablet, Take 60 mg by mouth 4 (four) times daily. , Disp: , Rfl: 4 .  topiramate (TOPAMAX) 100 MG tablet, Take 1 tablet (100 mg total) by mouth at bedtime., Disp: 90 tablet, Rfl: 1 .  VENTOLIN  HFA 108 (90 Base) MCG/ACT inhaler, INHALE 2 PUFFS BY MOUTH EVERY 4 HOURS AS NEEDED SHORTNESS OF BREATH/WHEEZING, Disp: 18 g, Rfl: 0  Allergies  Allergen Reactions  . Ace Inhibitors     cough  . Amlodipine     edema  . Gabapentin     edema  . Pregabalin Other (See Comments)    anxiety     ROS  Constitutional: Negative for fever or weight change.  Respiratory: Positive for cough and shortness of breath.   Cardiovascular: Negative for chest pain or palpitations.  Gastrointestinal: Negative for abdominal pain, no bowel changes.  Musculoskeletal: Negative for gait problem or joint swelling.  Skin: Negative for rash.  Neurological: Negative for dizziness or headache.  No other specific complaints in a complete review of systems (except as listed in HPI above).  Objective  Vitals:   10/23/16 1339  BP: 128/68  Pulse: 67  Resp: 16  Temp: 98.8 F (37.1 C)  SpO2: 93%  Weight: 235 lb 6 oz (106.8 kg)  Height: 5' 2" (1.575 m)    Body mass index is 43.05 kg/m.  Physical Exam  Constitutional: Patient appears well-developed and well-nourished. Obese No distress.  HEENT: head atraumatic, normocephalic, pupils equal and reactive to light,  neck supple, throat within  normal limits Cardiovascular: Normal rate, regular rhythm and normal heart sounds.  No murmur heard. No BLE edema. Pulmonary/Chest: Effort normal and breath sounds normal. No respiratory distress. Abdominal: Soft.  There is no tenderness. Psychiatric: Patient has a normal mood and affect. behavior is normal. Judgment and thought content normal.  Recent Results (from the past 2160 hour(s))  HM DIABETES EYE EXAM     Status: None   Collection Time: 08/27/16 12:00 AM  Result Value Ref Range   HM Diabetic Eye Exam No Retinopathy No Retinopathy    Comment: [place] PANEL WITH GFR     Status: None   Collection Time: 09/05/16  9:40 AM  Result Value Ref Range   Sodium 141 135 - 146 mmol/L   Potassium 4.2 3.5 - 5.3 mmol/L   Chloride 106 98 - 110 mmol/L   CO2 23 20 - 31 mmol/L   Glucose, Bld 88 65 - 99 mg/dL   BUN 16 7 - 25 mg/dL   Creat 0.57 0.50 - 0.99 mg/dL    Comment:   For patients > or = 62 years of age: The upper reference limit for Creatinine is approximately 13% higher for people identified as African-American.      Total Bilirubin 0.4 0.2 - 1.2 mg/dL   Alkaline Phosphatase 45 33 - 130 U/L   AST 19 10 - 35 U/L   ALT 22 6 - 29 U/L   Total Protein 7.1 6.1 - 8.1 g/dL   Albumin 4.2 3.6 - 5.1 g/dL   Calcium 9.4 8.6 - 10.4 mg/dL   GFR, Est African American >89 >=60 mL/min   GFR, Est Non African American >89 >=60 mL/min  CBC with Differential/Platelet     Status: None   Collection Time: 09/05/16  9:40 AM  Result Value Ref Range   WBC 8.7 3.8 - 10.8 K/uL   RBC 4.84 3.80 - 5.10 MIL/uL   Hemoglobin 14.1 11.7 - 15.5 g/dL   HCT 42.2 35.0 - 45.0 %   MCV 87.2 80.0 - 100.0 fL   MCH 29.1 27.0 - 33.0 pg   MCHC 33.4 32.0 - 36.0 g/dL   RDW 13.9 11.0 - 15.0 %  Platelets 280 140 - 400 K/uL   MPV 9.4 7.5 - 12.5 fL   Neutro Abs 4,785 1,500 - 7,800 cells/uL   Lymphs Abs 3,219 850 - 3,900 cells/uL   Monocytes Absolute 522 200 - 950 cells/uL   Eosinophils  Absolute 87 15 - 500 cells/uL   Basophils Absolute 87 0 - 200 cells/uL   Neutrophils Relative % 55 %   Lymphocytes Relative 37 %   Monocytes Relative 6 %   Eosinophils Relative 1 %   Basophils Relative 1 %   Smear Review Criteria for review not met   Lipid panel     Status: None   Collection Time: 09/05/16  9:40 AM  Result Value Ref Range   Cholesterol 174 <200 mg/dL   Triglycerides 116 <150 mg/dL   HDL 67 >50 mg/dL   Total CHOL/HDL Ratio 2.6 <5.0 Ratio   VLDL 23 <30 mg/dL   LDL Cholesterol 84 <100 mg/dL  Vitamin B12     Status: Abnormal   Collection Time: 09/05/16  9:40 AM  Result Value Ref Range   Vitamin B-12 1,161 (H) 200 - 1,100 pg/mL  VITAMIN D 25 Hydroxy (Vit-D Deficiency, Fractures)     Status: None   Collection Time: 09/05/16  9:40 AM  Result Value Ref Range   Vit D, 25-Hydroxy 47 30 - 100 ng/mL    Comment: Vitamin D Status           25-OH Vitamin D        Deficiency                <20 ng/mL        Insufficiency         20 - 29 ng/mL        Optimal             > or = 30 ng/mL   For 25-OH Vitamin D testing on patients on D2-supplementation and patients for whom quantitation of D2 and D3 fractions is required, the QuestAssureD 25-OH VIT D, (D2,D3), LC/MS/MS is recommended: order code [phone] (patients > 2 yrs).   Glucose, capillary     Status: Abnormal   Collection Time: 09/30/16  7:13 AM  Result Value Ref Range   Glucose-Capillary 101 (H) 65 - 99 mg/dL  Glucose, capillary     Status: None   Collection Time: 09/30/16  8:33 AM  Result Value Ref Range   Glucose-Capillary 87 65 - 99 mg/dL  POCT HgB A1C     Status: Normal   Collection Time: 10/23/16  1:44 PM  Result Value Ref Range   Hemoglobin A1C 5.7      PHQ2/9: Depression screen [facility] 2/9 09/05/2016 07/01/2016 03/22/2016 12/15/2015 09/15/2015  Decreased Interest 0 0 0 0 0  Down, Depressed, Hopeless 0 0 0 0 0  PHQ - 2 Score 0 0 0 0 0     Fall Risk: Fall Risk  09/05/2016 07/01/2016 03/22/2016 12/15/2015 09/15/2015   Falls in the past year? No Yes No No No  Number falls in past yr: - 1 - - -  Injury with Fall? - No - - -     Assessment & Plan  1. Type 2 diabetes, controlled, with neuropathy (HCC)  - POCT HgB A1C - DULoxetine (CYMBALTA) 60 MG capsule; Take 1 capsule (60 mg total) by mouth daily.  Dispense: 90 capsule; Refill: 1  2. Morbid obesity, unspecified obesity type [facility])  Discussed with the patient the risk posed by an increased BMI.  Discussed importance of portion control, calorie counting and at least 150 minutes of physical activity weekly. Avoid sweet beverages and drink more water. Eat at least 6 servings of fruit and vegetables daily   3. Seronegative myasthenia gravis ([place])  stable  4. Bariatric surgery status  Needs to resume healthy diet and avoid eating out  5. Dyslipidemia  - Rosuvastatin  (CRESTOR) 5 MG tablet; Take 1 tablet (5 mg total) by mouth every evening.  Dispense: 90 tablet; Refill: 1  6. Peripheral polyneuropathy  She could not tolerate Gabapentin or Lyrica - DULoxetine (CYMBALTA) 60 MG capsule; Take 1 capsule (60 mg total) by mouth daily.  Dispense: 90 capsule; Refill: 1  7. Perennial allergic rhinitis  Not controlled , she will try increasing Loratadine to twice daily  - montelukast (SINGULAIR) 10 MG tablet; Take 1 tablet (10 mg total) by mouth at bedtime.  Dispense: 90 tablet; Refill: 1 - loratadine (CLARITIN) 10 MG tablet; Take 1 tablet (10 mg total) by mouth 2 (two) times daily.  Dispense: 60 tablet; Refill: 0  8. Mild intermittent asthma without complication  A little worse this time of the year  9. Chronic tension-type headache, intractable  - topiramate (TOPAMAX) 100 MG tablet; Take 1 tablet (100 mg total) by mouth at bedtime.  Dispense: 90 tablet; Refill: 1

## 2016-11-14 DIAGNOSIS — E042 Nontoxic multinodular goiter: Secondary | ICD-10-CM | POA: Diagnosis not present

## 2016-11-15 DIAGNOSIS — E042 Nontoxic multinodular goiter: Secondary | ICD-10-CM | POA: Diagnosis not present

## 2016-11-20 DIAGNOSIS — E042 Nontoxic multinodular goiter: Secondary | ICD-10-CM | POA: Diagnosis not present

## 2016-11-27 DIAGNOSIS — E0580 Other thyrotoxicosis without thyrotoxic crisis or storm: Secondary | ICD-10-CM | POA: Diagnosis not present

## 2016-11-27 DIAGNOSIS — E042 Nontoxic multinodular goiter: Secondary | ICD-10-CM | POA: Diagnosis not present

## 2016-12-11 DIAGNOSIS — H2512 Age-related nuclear cataract, left eye: Secondary | ICD-10-CM | POA: Diagnosis not present

## 2017-01-14 DIAGNOSIS — H2512 Age-related nuclear cataract, left eye: Secondary | ICD-10-CM | POA: Diagnosis not present

## 2017-01-21 DIAGNOSIS — E042 Nontoxic multinodular goiter: Secondary | ICD-10-CM | POA: Diagnosis not present

## 2017-01-22 DIAGNOSIS — E042 Nontoxic multinodular goiter: Secondary | ICD-10-CM | POA: Diagnosis not present

## 2017-01-23 ENCOUNTER — Encounter: Payer: Self-pay | Admitting: *Deleted

## 2017-01-28 ENCOUNTER — Ambulatory Visit: Payer: 59 | Admitting: Anesthesiology

## 2017-01-28 ENCOUNTER — Ambulatory Visit
Admission: RE | Admit: 2017-01-28 | Discharge: 2017-01-28 | Disposition: A | Discharge status: Home / Self Care | Payer: 59 | Source: Ambulatory Visit | Attending: Ophthalmology | Admitting: Ophthalmology

## 2017-01-28 ENCOUNTER — Encounter
Admission: RE | Disposition: A | Discharge status: Home / Self Care | Payer: Self-pay | Source: Ambulatory Visit | Attending: Ophthalmology

## 2017-01-28 ENCOUNTER — Encounter: Payer: Self-pay | Admitting: *Deleted

## 2017-01-28 DIAGNOSIS — H2512 Age-related nuclear cataract, left eye: Secondary | ICD-10-CM | POA: Diagnosis not present

## 2017-01-28 DIAGNOSIS — Z79899 Other long term (current) drug therapy: Secondary | ICD-10-CM | POA: Diagnosis not present

## 2017-01-28 DIAGNOSIS — E1136 Type 2 diabetes mellitus with diabetic cataract: Secondary | ICD-10-CM | POA: Diagnosis not present

## 2017-01-28 DIAGNOSIS — F418 Other specified anxiety disorders: Secondary | ICD-10-CM | POA: Diagnosis not present

## 2017-01-28 DIAGNOSIS — E7800 Pure hypercholesterolemia, unspecified: Secondary | ICD-10-CM | POA: Insufficient documentation

## 2017-01-28 DIAGNOSIS — I10 Essential (primary) hypertension: Secondary | ICD-10-CM | POA: Diagnosis not present

## 2017-01-28 DIAGNOSIS — E1140 Type 2 diabetes mellitus with diabetic neuropathy, unspecified: Secondary | ICD-10-CM | POA: Diagnosis not present

## 2017-01-28 DIAGNOSIS — E042 Nontoxic multinodular goiter: Secondary | ICD-10-CM | POA: Diagnosis not present

## 2017-01-28 DIAGNOSIS — G7000 Myasthenia gravis without (acute) exacerbation: Secondary | ICD-10-CM | POA: Insufficient documentation

## 2017-01-28 DIAGNOSIS — J45909 Unspecified asthma, uncomplicated: Secondary | ICD-10-CM | POA: Diagnosis not present

## 2017-01-28 HISTORY — PX: CATARACT EXTRACTION W/PHACO: SHX586

## 2017-01-28 HISTORY — DX: Personal history of other diseases of the nervous system and sense organs: Z86.69

## 2017-01-28 LAB — GLUCOSE, CAPILLARY: GLUCOSE-CAPILLARY: 106 mg/dL — AB (ref 65–99)

## 2017-01-28 SURGERY — PHACOEMULSIFICATION, CATARACT, WITH IOL INSERTION
Anesthesia: Monitor Anesthesia Care | Site: Eye | Laterality: Left | Wound class: Clean

## 2017-01-28 MED ORDER — ARMC OPHTHALMIC DILATING DROPS
OPHTHALMIC | Status: AC
  Administered 2017-01-28: 1 via OPHTHALMIC
  Filled 2017-01-28: qty 0.4

## 2017-01-28 MED ORDER — SODIUM CHLORIDE 0.9 % IV SOLN
INTRAVENOUS | Status: DC
  Administered 2017-01-28: 50 mL/h via INTRAVENOUS

## 2017-01-28 MED ORDER — POVIDONE-IODINE 5 % OP SOLN
OPHTHALMIC | Status: AC
  Filled 2017-01-28: qty 30

## 2017-01-28 MED ORDER — MIDAZOLAM HCL 2 MG/2ML IJ SOLN
INTRAMUSCULAR | Status: DC | PRN
  Administered 2017-01-28: 2 mg via INTRAVENOUS

## 2017-01-28 MED ORDER — MOXIFLOXACIN HCL 0.5 % OP SOLN
OPHTHALMIC | Status: DC | PRN
  Administered 2017-01-28: .2 mL via OPHTHALMIC

## 2017-01-28 MED ORDER — ARMC OPHTHALMIC DILATING DROPS
1.0000 "application " | OPHTHALMIC | Status: AC
  Administered 2017-01-28 (×3): 1 via OPHTHALMIC

## 2017-01-28 MED ORDER — LIDOCAINE HCL (PF) 4 % IJ SOLN
INTRAMUSCULAR | Status: AC
  Filled 2017-01-28: qty 5

## 2017-01-28 MED ORDER — MOXIFLOXACIN HCL 0.5 % OP SOLN
OPHTHALMIC | Status: AC
  Filled 2017-01-28: qty 3

## 2017-01-28 MED ORDER — NA CHONDROIT SULF-NA HYALURON 40-17 MG/ML IO SOLN
INTRAOCULAR | Status: DC | PRN
  Administered 2017-01-28: 1 mL via INTRAOCULAR

## 2017-01-28 MED ORDER — CARBACHOL 0.01 % IO SOLN
INTRAOCULAR | Status: DC | PRN
Start: 2017-01-28 — End: 2017-01-28
  Administered 2017-01-28: .5 mL via INTRAOCULAR

## 2017-01-28 MED ORDER — MOXIFLOXACIN HCL 0.5 % OP SOLN
1.0000 [drp] | OPHTHALMIC | Status: DC | PRN
Start: 2017-01-28 — End: 2017-01-28

## 2017-01-28 MED ORDER — POVIDONE-IODINE 5 % OP SOLN
OPHTHALMIC | Status: DC | PRN
Start: 2017-01-28 — End: 2017-01-28
  Administered 2017-01-28: 1 via OPHTHALMIC

## 2017-01-28 MED ORDER — MIDAZOLAM HCL 2 MG/2ML IJ SOLN
INTRAMUSCULAR | Status: AC
Start: 2017-01-28 — End: 2017-01-28
  Filled 2017-01-28: qty 2

## 2017-01-28 MED ORDER — EPINEPHRINE PF 1 MG/ML IJ SOLN
INTRAMUSCULAR | Status: AC
  Filled 2017-01-28: qty 1

## 2017-01-28 MED ORDER — EPINEPHRINE PF 1 MG/ML IJ SOLN
INTRAOCULAR | Status: DC | PRN
  Administered 2017-01-28: 1 mL via OPHTHALMIC

## 2017-01-28 MED ORDER — NA CHONDROIT SULF-NA HYALURON 40-17 MG/ML IO SOLN
INTRAOCULAR | Status: AC
  Filled 2017-01-28: qty 1

## 2017-01-28 MED ORDER — LIDOCAINE HCL (PF) 4 % IJ SOLN
INTRAOCULAR | Status: DC | PRN
  Administered 2017-01-28: 2 mL via OPHTHALMIC

## 2017-01-28 SURGICAL SUPPLY — 16 items
GLOVE BIO SURGEON STRL SZ8 (GLOVE) ×3 IMPLANT
GLOVE BIOGEL M 6.5 STRL (GLOVE) ×3 IMPLANT
GLOVE SURG LX 8.0 MICRO (GLOVE) ×2
GLOVE SURG LX STRL 8.0 MICRO (GLOVE) ×1 IMPLANT
GOWN STRL REUS W/ TWL LRG LVL3 (GOWN DISPOSABLE) ×2 IMPLANT
GOWN STRL REUS W/TWL LRG LVL3 (GOWN DISPOSABLE) ×4
LABEL CATARACT MEDS ST (LABEL) ×3 IMPLANT
LENS IOL TECNIS ITEC 20.5 (Intraocular Lens) ×3 IMPLANT
PACK CATARACT (MISCELLANEOUS) ×3 IMPLANT
PACK CATARACT BRASINGTON LX (MISCELLANEOUS) ×3 IMPLANT
PACK EYE AFTER SURG (MISCELLANEOUS) ×3 IMPLANT
SOL BSS BAG (MISCELLANEOUS) ×3
SOLUTION BSS BAG (MISCELLANEOUS) ×1 IMPLANT
SYR 5ML LL (SYRINGE) ×3 IMPLANT
WATER STERILE IRR 250ML POUR (IV SOLUTION) ×3 IMPLANT
WIPE NON LINTING 3.25X3.25 (MISCELLANEOUS) ×3 IMPLANT

## 2017-01-28 NOTE — Anesthesia Post-op Follow-up Note (Signed)
Anesthesia QCDR form completed.        

## 2017-01-28 NOTE — Transfer of Care (Signed)
Immediate Anesthesia Transfer of Care Note  Patient: Jasmine Woods  Procedure(s) Performed: Procedure(s) with comments: CATARACT EXTRACTION PHACO AND INTRAOCULAR LENS PLACEMENT (IOC) (Left) - [place] 00:37 AP% 17.8 CDE 6.61 Fluid pack lot # [id number]  Patient Location: Short Stay  Anesthesia Type:MAC  Level of Consciousness: awake, alert  and oriented  Airway & Oxygen Therapy: Patient Spontanous Breathing  Post-op Assessment: Post -op Vital signs reviewed and stable  Post vital signs: stable  Last Vitals:  Vitals:   01/28/17 0633 01/28/17 0748  BP: (!) 127/59 (!) 144/68  Pulse: (!) 56 (!) 53  Resp: 16 12  Temp: (!) 36.1 C 36.8 C    Last Pain:  Vitals:   01/28/17 0748  TempSrc: Oral      Patients Stated Pain Goal: 0 (85/92/92 4462)  Complications: No apparent anesthesia complications

## 2017-01-28 NOTE — Anesthesia Postprocedure Evaluation (Signed)
Anesthesia Post Note  Patient: Jasmine Woods  Procedure(s) Performed: Procedure(s) (LRB): CATARACT EXTRACTION PHACO AND INTRAOCULAR LENS PLACEMENT (IOC) (Left)  Patient location during evaluation: Short Stay Anesthesia Type: MAC Level of consciousness: awake and alert Pain management: pain level controlled Vital Signs Assessment: post-procedure vital signs reviewed and stable Respiratory status: spontaneous breathing, nonlabored ventilation, respiratory function stable and patient connected to nasal cannula oxygen Cardiovascular status: stable and blood pressure returned to baseline Anesthetic complications: no     Last Vitals:  Vitals:   01/28/17 0633 01/28/17 0748  BP: (!) 127/59 (!) 144/68  Pulse: (!) 56 (!) 53  Resp: 16 12  Temp: (!) 36.1 C 36.8 C    Last Pain:  Vitals:   01/28/17 0748  TempSrc: Oral                 Mateen Franssen,  Clearnce Sorrel

## 2017-01-28 NOTE — Op Note (Signed)
PREOPERATIVE DIAGNOSIS:  Nuclear sclerotic cataract of the left eye.   POSTOPERATIVE DIAGNOSIS:  Nuclear sclerotic cataract of the left eye.   OPERATIVE PROCEDURE: Procedure(s): CATARACT EXTRACTION PHACO AND INTRAOCULAR LENS PLACEMENT (IOC)   SURGEON:  Birder Robson, MD.   ANESTHESIA:  Anesthesiologist: Piscitello, Precious Haws, MD CRNA: Aline Brochure, CRNA  1.      Managed anesthesia care. 2.     0.55ml of Shugarcaine was instilled following the paracentesis   COMPLICATIONS:  None.   TECHNIQUE:   Stop and chop   DESCRIPTION OF PROCEDURE:  The patient was examined and consented in the preoperative holding area where the aforementioned topical anesthesia was applied to the left eye and then brought back to the Operating Room where the left eye was prepped and draped in the usual sterile ophthalmic fashion and a lid speculum was placed. A paracentesis was created with the side port blade and the anterior chamber was filled with viscoelastic. A near clear corneal incision was performed with the steel keratome. A continuous curvilinear capsulorrhexis was performed with a cystotome followed by the capsulorrhexis forceps. Hydrodissection and hydrodelineation were carried out with BSS on a blunt cannula. The lens was removed in a stop and chop  technique and the remaining cortical material was removed with the irrigation-aspiration handpiece. The capsular bag was inflated with viscoelastic and the Technis [id number] lens was placed in the capsular bag without complication. The remaining viscoelastic was removed from the eye with the irrigation-aspiration handpiece. The wounds were hydrated. The anterior chamber was flushed with Miostat and the eye was inflated to physiologic pressure. 0.17ml Vigamox was placed in the anterior chamber. The wounds were found to be water tight. The eye was dressed with Vigamox. The patient was given protective glasses to wear throughout the day and a shield with which to  sleep tonight. The patient was also given drops with which to begin a drop regimen today and will follow-up with me in one day.  Implant Name Type Inv. Item Serial No. Manufacturer Lot No. LRB No. Used  LENS IOL DIOP 20.5 - [id number] Intraocular Lens LENS IOL DIOP 20.5 [id number]   Left 1    Procedure(s) with comments: CATARACT EXTRACTION PHACO AND INTRAOCULAR LENS PLACEMENT (IOC) (Left) - [place] 00:37 AP% 17.8 CDE 6.61 Fluid pack lot # [id number]  Electronically signed: Ciella Obi [place] 01/28/2017 7:45 AM

## 2017-01-28 NOTE — Anesthesia Preprocedure Evaluation (Signed)
Anesthesia Evaluation  Patient identified by MRN, date of birth, ID band Patient awake    Reviewed: Allergy & Precautions, H&P , NPO status , Patient's Chart, lab work & pertinent test results  Airway Mallampati: I  TM Distance: >3 FB Neck ROM: full    Dental  (+) Poor Dentition, Chipped   Pulmonary neg shortness of breath, asthma , sleep apnea and Continuous Positive Airway Pressure Ventilation , former smoker,           Cardiovascular Exercise Tolerance: Good hypertension, (-) angina(-) Past MI and (-) DOE + Valvular Problems/Murmurs      Neuro/Psych  Headaches, PSYCHIATRIC DISORDERS Anxiety Depression  Neuromuscular disease    GI/Hepatic negative GI ROS, Neg liver ROS,   Endo/Other  diabetes, Type 2  Renal/GU      Musculoskeletal  (+) Arthritis ,   Abdominal   Peds  Hematology negative hematology ROS (+)   Anesthesia Other Findings Past Medical History: No date: Allergy No date: Anxiety No date: Asthma No date: Bladder polyp     Comment:  Dr. Ernst Spell No date: Carpal tunnel syndrome No date: Depression No date: Diabetes mellitus without complication (HCC)     Comment:  Diet controlled No date: Double vision     Comment:  seen by Dr. 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 myastenia gravis No date: Dyslipidemia No date: Frequency of urination No date: H/O myasthenia gravis     Comment:  MOSTLY AFFECTS EYES No date: HA (headache) No date: Heart murmur     Comment:  followed by PCP No date: Hx of bariatric surgery No date: Hypertension     Comment:  no meds No date: Internal hemorrhoids No date: Myasthenia gravis ([place])     Comment:  "mostly effects eyes" No date: Neuropathy, generalized     Comment:  diabetic on feet No date: Nuclear sclerotic cataract of both eyes No date: Obesity (BMI 30-39.9) No date: Obstructive sleep apnea on CPAP     Comment:  uses cpap No date: Patient is Jehovah's Witness     Comment:  no  blood products 07/2009: Rotator cuff tendonitis     Comment:  also has impingment right shoulder pain, seen by Dr.               Mack Guise No date: Sleep apnea No date: Thyroid nodule No date: Vaginal dryness, menopausal No date: Vitamin D deficiency  Past Surgical History: 1991: ABDOMINAL HYSTERECTOMY 2014: BARIATRIC SURGERY 2012: COLONOSCOPY 09/30/2016: COLONOSCOPY WITH PROPOFOL; N/A     Comment:  Procedure: COLONOSCOPY WITH PROPOFOL;  Surgeon: Lucilla Lame, MD;  Location: [facility];  Service:               Endoscopy;  Laterality: N/A;  diabetic - diet               controlled sleep apnea 1979-1980: KNEE SURGERY; Right 03/27/2016: PARS PLANA VITRECTOMY; Left     Comment:  Procedure: PARS PLANA VITRECTOMY WITH 25 GAUGE AND               MEMBRANE PEEL, INTERNAL LIMITING MEMBRANE;  Surgeon: Hurman Horn, MD;  Location: [facility];  Service: Ophthalmology;               Laterality: Left; No date: THYROID SURGERY     Comment:  BX done was benign No  date: UPPER GI ENDOSCOPY 04/27/2016: VITRECTOMY; Left  BMI    Body Mass Index:  42.98 kg/m      Reproductive/Obstetrics negative OB ROS                             Anesthesia Physical Anesthesia Plan  ASA: III  Anesthesia Plan: MAC   Post-op Pain Management:    Induction: Intravenous  PONV Risk Score and Plan:   Airway Management Planned: Natural Airway and Nasal Cannula  Additional Equipment:   Intra-op Plan:   Post-operative Plan:   Informed Consent: I have reviewed the patients History and Physical, chart, labs and discussed the procedure including the risks, benefits and alternatives for the proposed anesthesia with the patient or authorized representative who has indicated his/her understanding and acceptance.   Dental Advisory Given  Plan Discussed with: Anesthesiologist, CRNA and Surgeon  Anesthesia Plan Comments: (Patient consented for risks of  anesthesia including but not limited to:  - adverse reactions to medications - damage to teeth, lips or other oral mucosa - sore throat or hoarseness - Damage to heart, brain, lungs or loss of life  Patient voiced understanding.)        Anesthesia Quick Evaluation

## 2017-01-28 NOTE — H&P (Signed)
All labs reviewed. Abnormal studies sent to patients PCP when indicated.  Previous H&P reviewed, patient examined, there are NO CHANGES.  Jasmine Woods LOUIS8/7/20187:10 AM

## 2017-01-28 NOTE — Discharge Instructions (Signed)
Eye Surgery Discharge Instructions  Expect mild scratchy sensation or mild soreness. DO NOT RUB YOUR EYE!  The day of surgery:  Minimal physical activity, but bed rest is not required  No reading, computer work, or close hand work  No bending, lifting, or straining.  May watch TV  For 24 hours:  No driving, legal decisions, or alcoholic beverages  Safety precautions  Eat anything you prefer: It is better to start with liquids, then soup then solid foods.  _____ Eye patch should be worn until postoperative exam tomorrow.  ____ Solar shield eyeglasses should be worn for comfort in the sunlight/patch while sleeping  Resume all regular medications including aspirin or Coumadin if these were discontinued prior to surgery. You may shower, bathe, shave, or wash your hair. Tylenol may be taken for mild discomfort.  Call your doctor if you experience significant pain, nausea, or vomiting, fever > 101 or other signs of infection. [phone] or [phone] Specific instructions:  Follow-up Information    Birder Robson, MD Follow up.   Specialty:  Ophthalmology Why:  August 8 at 10:00am Contact information: [place] [address] [phone]         AMBULATORY SURGERY  DISCHARGE INSTRUCTIONS   1) The drugs that you were given will stay in your system until tomorrow so for the next 24 hours you should not:  A) Drive an automobile B) Make any legal decisions C) Drink any alcoholic beverage   2) You may resume regular meals tomorrow.  Today it is better to start with liquids and gradually work up to solid foods.  You may eat anything you prefer, but it is better to start with liquids, then soup and crackers, and gradually work up to solid foods.   3) Please notify your doctor immediately if you have any unusual bleeding, trouble breathing, redness and pain at the surgery site, drainage, fever, or pain not relieved by  medication.    4) Additional Instructions:        Please contact your physician with any problems or Same Day Surgery at [phone], Monday through Friday 6 am to 4 pm, or [place] at [facility] number at [phone].

## 2017-02-03 ENCOUNTER — Other Ambulatory Visit
Admission: RE | Admit: 2017-02-03 | Discharge: 2017-02-03 | Disposition: A | Discharge status: Home / Self Care | Payer: 59 | Source: Ambulatory Visit | Attending: Family Medicine | Admitting: Family Medicine

## 2017-02-03 ENCOUNTER — Ambulatory Visit
Admission: RE | Admit: 2017-02-03 | Discharge: 2017-02-03 | Disposition: A | Discharge status: Home / Self Care | Payer: 59 | Source: Ambulatory Visit | Attending: Family Medicine | Admitting: Family Medicine

## 2017-02-03 ENCOUNTER — Other Ambulatory Visit: Payer: Self-pay | Admitting: Family Medicine

## 2017-02-03 ENCOUNTER — Encounter: Payer: Self-pay | Admitting: Family Medicine

## 2017-02-03 ENCOUNTER — Ambulatory Visit: Payer: 59

## 2017-02-03 ENCOUNTER — Ambulatory Visit (INDEPENDENT_AMBULATORY_CARE_PROVIDER_SITE_OTHER): Payer: 59 | Admitting: Family Medicine

## 2017-02-03 VITALS — BP 126/84 | HR 71 | Temp 98.7°F | Resp 16 | Ht 62.0 in | Wt 236.4 lb

## 2017-02-03 DIAGNOSIS — J4520 Mild intermittent asthma, uncomplicated: Secondary | ICD-10-CM | POA: Diagnosis not present

## 2017-02-03 DIAGNOSIS — R0789 Other chest pain: Secondary | ICD-10-CM

## 2017-02-03 DIAGNOSIS — R7989 Other specified abnormal findings of blood chemistry: Secondary | ICD-10-CM

## 2017-02-03 DIAGNOSIS — J4521 Mild intermittent asthma with (acute) exacerbation: Secondary | ICD-10-CM

## 2017-02-03 DIAGNOSIS — R079 Chest pain, unspecified: Secondary | ICD-10-CM | POA: Diagnosis not present

## 2017-02-03 LAB — CREATININE, SERUM
Creatinine, Ser: 0.6 mg/dL (ref 0.44–1.00)
GFR calc non Af Amer: >60 mL/min (ref >60)

## 2017-02-03 LAB — CBC WITH DIFFERENTIAL/PLATELET
Basophils Absolute: 0 cells/uL (ref 0–200)
Basophils Relative: 0 %
Eosinophils Absolute: 190 cells/uL (ref 15–500)
Eosinophils Relative: 2 %
HCT: 42.3 % (ref 35.0–45.0)
Hemoglobin: 14.1 g/dL (ref 11.7–15.5)
Lymphocytes Relative: 29 %
Lymphs Abs: 2755 cells/uL (ref 850–3900)
MCH: 29.3 pg (ref 27.0–33.0)
MCHC: 33.3 g/dL (ref 32.0–36.0)
MCV: 87.8 fL (ref 80.0–100.0)
MPV: 9.7 fL (ref 7.5–12.5)
Monocytes Absolute: 760 cells/uL (ref 200–950)
Monocytes Relative: 8 %
Neutro Abs: 5795 cells/uL (ref 1500–7800)
Neutrophils Relative %: 61 %
Platelets: 294 10*3/uL (ref 140–400)
RBC: 4.82 MIL/uL (ref 3.80–5.10)
RDW: 13.4 % (ref 11.0–15.0)
WBC: 9.5 10*3/uL (ref 3.8–10.8)

## 2017-02-03 LAB — BUN: BUN: 23 mg/dL — AB (ref 6–20)

## 2017-02-03 LAB — D-DIMER, QUANTITATIVE: D-Dimer, Quant: 0.67 mcg/mL FEU — ABNORMAL HIGH (ref <0.50)

## 2017-02-03 MED ORDER — MOXIFLOXACIN HCL 0.5 % OP SOLN: 1.0000 [drp] | Freq: Three times a day (TID) | OPHTHALMIC | 0 refills | Status: DC

## 2017-02-03 MED ORDER — PREDNISONE 10 MG PO TABS: 10.0000 mg | ORAL_TABLET | Freq: Every day | ORAL | 0 refills | Status: DC

## 2017-02-03 MED ORDER — IOPAMIDOL (ISOVUE-370) INJECTION 76%
75.0000 mL | Freq: Once | INTRAVENOUS | Status: AC | PRN
  Administered 2017-02-03: 75 mL via INTRAVENOUS

## 2017-02-03 NOTE — Patient Instructions (Addendum)
- Go to [facility] for Chest Xray today.  I will call you if you need an antibiotic. - Have lab work done today in our clinic.  - Stay very hydrated, take prednisone with food. Asthma, Adult Asthma is a condition of the lungs in which the airways tighten and narrow. Asthma can make it hard to breathe. Asthma cannot be cured, but medicine and lifestyle changes can help control it. Asthma may be started (triggered) by:  Animal skin flakes (dander).  Dust.  Cockroaches.  Pollen.  Mold.  Smoke.  Cleaning products.  Hair sprays or aerosol sprays.  Paint fumes or strong smells.  Cold air, weather changes, and winds.  Crying or laughing hard.  Stress.  Certain medicines or drugs.  Foods, such as dried fruit, potato chips, and sparkling grape juice.  Infections or conditions (colds, flu).  Exercise.  Certain medical conditions or diseases.  Exercise or tiring activities.  Follow these instructions at home:  Take medicine as told by your doctor.  Use a peak flow meter as told by your doctor. A peak flow meter is a tool that measures how well the lungs are working.  Record and keep track of the peak flow meter's readings.  Understand and use the asthma action plan. An asthma action plan is a written plan for taking care of your asthma and treating your attacks.  To help prevent asthma attacks: ? Do not smoke. Stay away from secondhand smoke. ? Change your heating and air conditioning filter often. ? Limit your use of fireplaces and wood stoves. ? Get rid of pests (such as roaches and mice) and their droppings. ? Throw away plants if you see mold on them. ? Clean your floors. Dust regularly. Use cleaning products that do not smell. ? Have someone vacuum when you are not home. Use a vacuum cleaner with a HEPA filter if possible. ? Replace carpet with wood, tile, or vinyl flooring. Carpet can trap animal skin flakes and dust. ? Use  allergy-proof pillows, mattress covers, and box spring covers. ? Wash bed sheets and blankets every week in hot water and dry them in a dryer. ? Use blankets that are made of polyester or cotton. ? Clean bathrooms and kitchens with bleach. If possible, have someone repaint the walls in these rooms with mold-resistant paint. Keep out of the rooms that are being cleaned and painted. ? Wash hands often. Contact a doctor if:  You have make a whistling sound when breaking (wheeze), have shortness of breath, or have a cough even if taking medicine to prevent attacks.  The colored mucus you cough up (sputum) is thicker than usual.  The colored mucus you cough up changes from clear or white to yellow, green, gray, or bloody.  You have problems from the medicine you are taking such as: ? A rash. ? Itching. ? Swelling. ? Trouble breathing.  You need reliever medicines more than 2-3 times a week.  Your peak flow measurement is still at 50-79% of your personal best after following the action plan for 1 hour.  You have a fever. Get help right away if:  You seem to be worse and are not responding to medicine during an asthma attack.  You are short of breath even at rest.  You get short of breath when doing very little activity.  You have trouble eating, drinking, or talking.  You have chest pain.  You have a fast heartbeat.  Your lips or fingernails start  to turn blue.  You are light-headed, dizzy, or faint.  Your peak flow is less than 50% of your personal best. This information is not intended to replace advice given to you by your health care provider. Make sure you discuss any questions you have with your health care provider. Document Released: 11/27/2007 Document Revised: 11/16/2015 Document Reviewed: 01/07/2013 Elsevier Interactive Patient Education  2017 [employer].

## 2017-02-03 NOTE — Progress Notes (Addendum)
Name: Jasmine Woods   MRN: [id number]    DOB: 08/16/1955   Date:02/03/2017       Progress Note  Subjective  Chief Complaint  Chief Complaint  Patient presents with  . Shortness of Breath    when taking deep breath, chest tightness, cough  . Back Pain    HPI  Pt presents with 3 day history of left back pain and central chest tightness (denies chest pain) x3 days. She also endorses intermittent non-productive congested cough, cold sweating, increased sinus congestion, and some mild nausea - no vomiting or diarrhea; right ear pressure but no pain; no pain/pressure in left ear.  No sinus pain, but endorses increased frontal sinus pressure; denies shortness of breath.  No recent strain or over use of the back.  Not a smoker - quit in her 33's.  Takes claritin, flonase, and singulair for sinus congestion. Has ventolin inhaler - has been using and this does help to relieve the tightness sensation.  Uses CPAP at night consistently.  Had catarract surgery 6 days ago - has been feeling well - was up and walking around the day of surgery and has not had any prolonged sitting, or car rides, or other travel.  No calf pain or swelling.  Using 3 new eye drops which are added to her list.  Patient Active Problem List   Diagnosis Date Noted  . Special screening for malignant neoplasms, colon   . Epiretinal membrane (ERM) of left eye 09/09/2016  . Glaucoma, left eye 09/05/2016  . Abnormal imaging of thyroid 09/15/2015  . Seronegative myasthenia gravis ([place]) 08/08/2015  . Psoriasis 07/06/2015  . Stress incontinence 07/06/2015  . Chronic tension-type headache, intractable 05/29/2015  . Anxiety and depression 04/12/2015  . Asthma, mild intermittent 04/12/2015  . Bladder polyp 04/12/2015  . Carpal tunnel syndrome 04/12/2015  . Binocular vision disorder with diplopia 04/12/2015  . Dyslipidemia 04/12/2015  . H/O: HTN (hypertension) 04/12/2015  . Hemorrhoids, internal 04/12/2015  . Neuropathy  04/12/2015  . Nuclear sclerotic cataract 04/12/2015  . Morbid obesity ([place]) 04/12/2015  . Obstructive apnea 04/12/2015  . Perennial allergic rhinitis 04/12/2015  . 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 Type 2 diabetes, controlled, with neuropathy ([place]) 04/12/2015  . Vitamin D deficiency 04/12/2015  . Cephalalgia 08/16/2014  . Lump or mass in breast 06/14/2013  . Bariatric surgery status 10/12/2012    Social History  Substance Use Topics  . Smoking status: Former Smoker    Years: 3.00    Quit date: 06/25/1975  . Smokeless tobacco: Never Used     Comment: smoked as teenager  . Alcohol use No     Current Outpatient Prescriptions:  .  acetaminophen (TYLENOL) 500 MG tablet, Take 1,000 mg by mouth every 6 (six) hours as needed for mild pain. Reported on 07/06/2015, Disp: , Rfl:  .  albuterol (PROVENTIL) (2.5 MG/3ML) 0.083% nebulizer solution, Take 3 mLs (2.5 mg total) by nebulization every 6 (six) hours as needed for wheezing or shortness of breath., Disp: 150 mL, Rfl: 1 .  brimonidine (ALPHAGAN) 0.2 % ophthalmic solution, Place 1 drop into the left eye 2 (two) times daily. , Disp: , Rfl:  .  budesonide (RHINOCORT AQUA) 32 MCG/ACT nasal spray, Place 2 sprays into both nostrils daily. (Patient taking differently: Place 2 sprays into both nostrils at bedtime. ), Disp: 1 Bottle, Rfl: 5 .  Calcium Carbonate-Vit D-Min (CALTRATE 600+D PLUS PO), Take 1 tablet by mouth daily., Disp: , Rfl:  .  Coenzyme Q10 (CO Q 10) 100 MG CAPS, Take 1 capsule by mouth daily., Disp: 30 capsule, Rfl: 2 .  DULoxetine (CYMBALTA) 60 MG capsule, Take 1 capsule (60 mg total) by mouth daily., Disp: 90 capsule, Rfl: 1 .  glucose blood (ACCU-CHEK GUIDE) test strip, Use as instructed, Disp: 100 each, Rfl: 12 .  loratadine (CLARITIN) 10 MG tablet, Take 1 tablet (10 mg total) by mouth 2 (two) times daily., Disp: 60 tablet, Rfl: 0 .  montelukast (SINGULAIR) 10 MG tablet, Take 1 tablet (10 mg total) by  mouth at bedtime., Disp: 90 tablet, Rfl: 1 .  Multiple Vitamins-Minerals (MULTIVITAMIN WITH MINERALS) tablet, Take 1 tablet by mouth daily., Disp: , Rfl:  .  Omega-3 Fatty Acids (FISH OIL) 1200 MG CAPS, Take 2 capsules by mouth daily., Disp: , Rfl:  .  Polyethyl Glycol-Propyl Glycol (SYSTANE OP), Apply 1 drop to eye as needed. Dry eyes, Disp: , Rfl:  .  pyridostigmine (MESTINON) 60 MG tablet, Take 60 mg by mouth 4 (four) times daily. 3 TO 4 TIMES DAILY, Disp: , Rfl: 4 .  rosuvastatin (CRESTOR) 5 MG tablet, Take 1 tablet (5 mg total) by mouth daily., Disp: 90 tablet, Rfl: 1 .  topiramate (TOPAMAX) 100 MG tablet, Take 1 tablet (100 mg total) by mouth at bedtime., Disp: 90 tablet, Rfl: 1 .  VENTOLIN HFA 108 (90 Base) MCG/ACT inhaler, INHALE 2 PUFFS BY MOUTH EVERY 4 HOURS AS NEEDED SHORTNESS OF BREATH/WHEEZING, Disp: 18 g, Rfl: 0  Allergies  Allergen Reactions  . Ace Inhibitors     cough  . Amlodipine     Edema of feet and hands  . Gabapentin     anxiety  . Pregabalin Other (See Comments)    anxiety    ROS  Ten systems reviewed and is negative except as mentioned in HPI   Objective  Vitals:   02/03/17 0836  BP: 126/84  Pulse: 71  Resp: 16  Temp: 98.7 F (37.1 C)  TempSrc: Oral  SpO2: 98%  Weight: 236 lb 6.4 oz (107.2 kg)  Height: 5\' 2"  (1.575 m)   Body mass index is 43.24 kg/m.  Nursing Note and Vital Signs reviewed.  Physical Exam  Constitutional: Patient appears well-developed and well-nourished. Obese  No distress.  HEENT: head atraumatic, normocephalic, pupils equal and reactive to light, EOM's intact, TM's without erythema or bulging, LEFT-sided frontal and maxillary sinus pain on palpation, neck supple without lymphadenopathy (goiter present - baseline for pt), oropharynx pink and moist without exudate. Cardiovascular: Normal rate, regular rhythm, S1/S2 present.  No murmur or rub heard. No BLE edema. Pulmonary/Chest: Effort normal and breath sounds clear throughout  - no wheezing, crackles, or rhonchi. No respiratory distress or retractions. Psychiatric: Patient has a normal mood and affect. behavior is normal. Judgment and thought content normal.  Recent Results (from the past 2160 hour(s))  Glucose, capillary     Status: Abnormal   Collection Time: 01/28/17  6:33 AM  Result Value Ref Range   Glucose-Capillary 106 (H) 65 - 99 mg/dL     Assessment & Plan  1. Mild intermittent asthma with acute exacerbation. - DG Chest 2 View; Future - CBC w/Diff/Platelet - predniSONE (DELTASONE) 10 MG tablet; Take 1 tablet (10 mg total) by mouth daily with breakfast. Day1:6tabs, Day2:5tabs, Day3:4tabs, Day4:3tabs, Day5:2tabs, Day6:1tabs  Dispense: 21 tablet; Refill: 0  2. Chest tightness - DG Chest 2 View; Future - CBC w/Diff/Platelet - D-Dimer, Quantitative  - Advised concerned for pneumonia, but lack of  adventitious lung sounds. We will obtain labs and CXR as above.  Advised to continue Albuterol inhaler Q4H PRN for tightness.  We will treat for asthma exacerbation, and will await results to consider antibiotic therapy. Patient is in agreement.  -Red flags and when to present for emergency care or RTC including fever >101.21F, chest pain, shortness of breath unrelieved with medications, new/worsening/un-resolving symptoms,  reviewed with patient at time of visit. Follow up and care instructions discussed and provided in AVS.  I have reviewed this encounter including the documentation in this note and/or discussed this patient with the Johney Maine, FNP, NP-C. I am certifying that I agree with the content of this note as supervising physician.  Steele Sizer, MD [facility] 02/04/2017, 5:04 PM

## 2017-02-25 ENCOUNTER — Ambulatory Visit: Payer: 59 | Admitting: Family Medicine

## 2017-03-11 ENCOUNTER — Ambulatory Visit (INDEPENDENT_AMBULATORY_CARE_PROVIDER_SITE_OTHER): Payer: 59 | Admitting: Family Medicine

## 2017-03-11 ENCOUNTER — Encounter: Payer: Self-pay | Admitting: Family Medicine

## 2017-03-11 ENCOUNTER — Ambulatory Visit: Admission: EM | Admit: 2017-03-11 | Discharge: 2017-03-11 | Discharge status: Absent without leave | Payer: 59

## 2017-03-11 VITALS — BP 126/68 | HR 66 | Temp 97.9°F | Resp 16 | Ht 62.0 in | Wt 243.3 lb

## 2017-03-11 DIAGNOSIS — H578 Other specified disorders of eye and adnexa: Secondary | ICD-10-CM | POA: Diagnosis not present

## 2017-03-11 DIAGNOSIS — Z0101 Encounter for examination of eyes and vision with abnormal findings: Secondary | ICD-10-CM

## 2017-03-11 DIAGNOSIS — H5789 Other specified disorders of eye and adnexa: Secondary | ICD-10-CM

## 2017-03-11 NOTE — Progress Notes (Signed)
Name: Jasmine Woods   MRN: [id number]    DOB: [birth year]   Date:03/11/2017       Progress Note  Subjective  Chief Complaint  Chief Complaint  Patient presents with  . Eye Pain    Onset-This morning, Left eye, watery, painful, itchy and red. Went to [facility] yesterday was seen w Dr. George Ina. Cataract surgery follow up is going well.    HPI  Left eye irritation: she went to see Dr. George Ina yesterday for post-op cataract surgery and was doing well, pressure down to around 21 from 37 shortly after surgery, eyes looked good and she felt well, however woke up this am with erythematous conjunctiva, some swelling of eyelids, watery, gritty sensation of eye, no blurred vision. No sick contacts at home or work. She has chronic sniffles, no change in her symptoms.    Patient Active Problem List   Diagnosis Date Noted  . Special screening for malignant neoplasms, colon   . Epiretinal membrane (ERM) of left eye 09/09/2016  . Glaucoma, left eye 09/05/2016  . Abnormal imaging of thyroid 09/15/2015  . Seronegative myasthenia gravis ([place]) 08/08/2015  . Psoriasis 07/06/2015  . Stress incontinence 07/06/2015  . Chronic tension-type headache, intractable 05/29/2015  . Anxiety and depression 04/12/2015  . Asthma, mild intermittent 04/12/2015  . Bladder polyp 04/12/2015  . Carpal tunnel syndrome 04/12/2015  . Binocular vision disorder with diplopia 04/12/2015  . Dyslipidemia 04/12/2015  . H/O: HTN (hypertension) 04/12/2015  . Hemorrhoids, internal 04/12/2015  . Neuropathy 04/12/2015  . Nuclear sclerotic cataract 04/12/2015  . Morbid obesity ([place]) 04/12/2015  . Obstructive apnea 04/12/2015  . Perennial allergic rhinitis 04/12/2015  . 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 Type 2 diabetes, controlled, with neuropathy ([place]) 04/12/2015  . Vitamin D deficiency 04/12/2015  . Cephalalgia 08/16/2014  . Lump or mass in breast 06/14/2013  . Bariatric surgery  status 10/12/2012    Past Surgical History:  Procedure Laterality Date  . ABDOMINAL HYSTERECTOMY  1991  . [place] SURGERY  2014  . CATARACT EXTRACTION W/PHACO Left 01/28/2017   Procedure: CATARACT EXTRACTION PHACO AND INTRAOCULAR LENS PLACEMENT (IOC);  Surgeon: Birder Robson, MD;  Location: [facility];  Service: Ophthalmology;  Laterality: Left;  [place] 00:37 AP% 17.8 CDE 6.61 Fluid pack lot # [id number]  . COLONOSCOPY  2012  . COLONOSCOPY WITH PROPOFOL N/A 09/30/2016   Procedure: COLONOSCOPY WITH PROPOFOL;  Surgeon: Lucilla Lame, MD;  Location: [facility];  Service: Endoscopy;  Laterality: N/A;  diabetic - diet controlled sleep apnea  . KNEE SURGERY Right 1979-1980  . PARS PLANA VITRECTOMY Left 03/27/2016   Procedure: PARS PLANA VITRECTOMY WITH 25 GAUGE AND MEMBRANE PEEL, INTERNAL LIMITING MEMBRANE;  Surgeon: Hurman Horn, MD;  Location: [facility];  Service: Ophthalmology;  Laterality: Left;  . THYROID SURGERY     BX done was benign  . UPPER GI ENDOSCOPY    . VITRECTOMY Left 04/27/2016    Family History  Problem Relation Age of Onset  . Diabetes Mother   . Hypertension Mother   . Muscular dystrophy Sister   . Breast cancer Neg Hx     Social History   Social History  . Marital status: Married    Spouse name: N/A  . Number of children: N/A  . Years of education: N/A   Occupational History  . Not on file.   Social History Main Topics  . Smoking status: Former Smoker    Years: 3.00    Quit  date: 06/25/1975  . Smokeless tobacco: Never Used     Comment: smoked as teenager  . Alcohol use No  . Drug use: No  . Sexual activity: Not Currently   Other Topics Concern  . Not on file   Social History Narrative  . No narrative on file     Current Outpatient Prescriptions:  .  acetaminophen (TYLENOL) 500 MG tablet, Take 1,000 mg by mouth every 6 (six) hours as needed for mild pain. Reported on 07/06/2015, Disp: , Rfl:  .  brimonidine (ALPHAGAN) 0.2 % ophthalmic solution,  Place 1 drop into the left eye 2 (two) times daily. , Disp: , Rfl:  .  budesonide (RHINOCORT AQUA) 32 MCG/ACT nasal spray, Place 2 sprays into both nostrils daily. (Patient taking differently: Place 2 sprays into both nostrils at bedtime. ), Disp: 1 Bottle, Rfl: 5 .  Coenzyme Q10 (CO Q 10) 100 MG CAPS, Take 1 capsule by mouth daily., Disp: 30 capsule, Rfl: 2 .  DULoxetine (CYMBALTA) 60 MG capsule, Take 1 capsule (60 mg total) by mouth daily., Disp: 90 capsule, Rfl: 1 .  glucose blood (ACCU-CHEK GUIDE) test strip, Use as instructed, Disp: 100 each, Rfl: 12 .  loratadine (CLARITIN) 10 MG tablet, Take 1 tablet (10 mg total) by mouth 2 (two) times daily., Disp: 60 tablet, Rfl: 0 .  montelukast (SINGULAIR) 10 MG tablet, Take 1 tablet (10 mg total) by mouth at bedtime., Disp: 90 tablet, Rfl: 1 .  moxifloxacin (VIGAMOX) 0.5 % ophthalmic solution, Place 1 drop into the left eye 3 (three) times daily., Disp: 3 mL, Rfl: 0 .  Multiple Vitamins-Minerals (MULTIVITAMIN WITH MINERALS) tablet, Take 1 tablet by mouth daily., Disp: , Rfl:  .  Omega-3 Fatty Acids (FISH OIL) 1200 MG CAPS, Take 2 capsules by mouth daily., Disp: , Rfl:  .  pyridostigmine (MESTINON) 60 MG tablet, Take 60 mg by mouth 4 (four) times daily. 3 TO 4 TIMES DAILY, Disp: , Rfl: 4 .  rosuvastatin (CRESTOR) 5 MG tablet, Take 1 tablet (5 mg total) by mouth daily., Disp: 90 tablet, Rfl: 1 .  topiramate (TOPAMAX) 100 MG tablet, Take 1 tablet (100 mg total) by mouth at bedtime., Disp: 90 tablet, Rfl: 1 .  VENTOLIN HFA 108 (90 Base) MCG/ACT inhaler, INHALE 2 PUFFS BY MOUTH EVERY 4 HOURS AS NEEDED SHORTNESS OF BREATH/WHEEZING, Disp: 18 g, Rfl: 0  Allergies  Allergen Reactions  . Ace Inhibitors     cough  . Amlodipine     Edema of feet and hands  . Gabapentin     anxiety  . Pregabalin Other (See Comments)    anxiety     ROS  Ten systems reviewed and is negative except as mentioned in HPI   Objective  Vitals:   03/11/17 1333  BP: 126/68   Pulse: 66  Resp: 16  Temp: 97.9 F (36.6 C)  TempSrc: Oral  SpO2: 98%  Weight: 243 lb 4.8 oz (110.4 kg)  Height: '5\' 2"'  (1.575 m)    Body mass index is 44.5 kg/m.  Physical Exam  Constitutional: Patient appears well-developed and well-nourished. Obese No distress.  HEENT: head atraumatic, normocephalic, pupils equal and reactive to light,, erythematous left conjunctiva, mild erythema and swelling of upper left eyelid, neck supple, throat within normal limits Cardiovascular: Normal rate, regular rhythm and normal heart sounds.  No murmur heard. No BLE edema. Pulmonary/Chest: Effort normal and breath sounds normal. No respiratory distress. Abdominal: Soft.  There is no tenderness. Psychiatric: Patient has  a normal mood and affect. behavior is normal. Judgment and thought content normal.  Recent Results (from the past 2160 hour(s))  Glucose, capillary     Status: Abnormal   Collection Time: 01/28/17  6:33 AM  Result Value Ref Range   Glucose-Capillary 106 (H) 65 - 99 mg/dL  CBC w/Diff/Platelet     Status: None   Collection Time: 02/03/17  9:30 AM  Result Value Ref Range   WBC 9.5 3.8 - 10.8 K/uL   RBC 4.82 3.80 - 5.10 MIL/uL   Hemoglobin 14.1 11.7 - 15.5 g/dL   HCT 42.3 35.0 - 45.0 %   MCV 87.8 80.0 - 100.0 fL   MCH 29.3 27.0 - 33.0 pg   MCHC 33.3 32.0 - 36.0 g/dL   RDW 13.4 11.0 - 15.0 %   Platelets 294 140 - 400 K/uL   MPV 9.7 7.5 - 12.5 fL   Neutro Abs 5,795 1,500 - 7,800 cells/uL   Lymphs Abs 2,755 850 - 3,900 cells/uL   Monocytes Absolute 760 200 - 950 cells/uL   Eosinophils Absolute 190 15 - 500 cells/uL   Basophils Absolute 0 0 - 200 cells/uL   Neutrophils Relative % 61 %   Lymphocytes Relative 29 %   Monocytes Relative 8 %   Eosinophils Relative 2 %   Basophils Relative 0 %   Smear Review Criteria for review not met   D-Dimer, Quantitative     Status: Abnormal   Collection Time: 02/03/17  9:30 AM  Result Value Ref Range   D-Dimer, Quant 0.67 (H) <0.50  mcg/mL FEU    Comment:   The D-Dimer test is used frequently to exclude an acute PE or DVT.  In patients with a low to moderate clinical risk assessment and a D-Dimer result <0.50 mcg/mL FEU, the likelihood of a PE or DVT is very low.  However, a thromboembolic event should not be excluded solely on the basis of the D-Dimer level.  Increased levels of D-Dimer are associated with a PE, DVT, DIC, malignancies, inflammation, sepsis, surgery, trauma, pregnancy, and advancing patient age. [Jama 2006 11:295(2): 174-944]   For additional information, please refer to: http://education.questdiagnostics.com/faq/FAQ149 (This link is being provided for information/ educational purposes only)     BUN     Status: Abnormal   Collection Time: 02/03/17  3:28 PM  Result Value Ref Range   BUN 23 (H) 6 - 20 mg/dL  Creatinine, serum     Status: None   Collection Time: 02/03/17  3:28 PM  Result Value Ref Range   Creatinine, Ser 0.60 0.44 - 1.00 mg/dL   GFR calc non Af Amer >60 >60 mL/min   GFR calc Af Amer >60 >60 mL/min    Comment: (NOTE) The eGFR has been calculated using the CKD EPI equation. This calculation has not been validated in all clinical situations. eGFR's persistently <60 mL/min signify possible Chronic Kidney Disease.     Visual Acuity Screening   Right eye Left eye Both eyes  Without correction:     With correction: 20/25 20/200 20/25    PHQ2/9: Depression screen [facility] 2/9 03/11/2017 09/05/2016 07/01/2016 03/22/2016 12/15/2015  Decreased Interest 0 0 0 0 0  Down, Depressed, Hopeless 0 0 0 0 0  PHQ - 2 Score 0 0 0 0 0     Fall Risk: Fall Risk  03/11/2017 09/05/2016 07/01/2016 03/22/2016 12/15/2015  Falls in the past year? No No Yes No No  Number falls in past yr: - - 1 - -  Injury with Fall? - - No - -    Functional Status Survey: Is the patient deaf or have difficulty hearing?: No Does the patient have difficulty seeing, even when wearing glasses/contacts?: No Does the  patient have difficulty concentrating, remembering, or making decisions?: No Does the patient have difficulty walking or climbing stairs?: No Does the patient have difficulty dressing or bathing?: No Does the patient have difficulty doing errands alone such as visiting a doctor's office or shopping?: No    Assessment & Plan  1. Irritation of left eye  - Visual acuity screening - Ambulatory referral to Ophthalmology  2. Failed vision screen  - Ambulatory referral to Ophthalmology

## 2017-03-18 ENCOUNTER — Encounter: Payer: Self-pay | Admitting: Family Medicine

## 2017-03-18 ENCOUNTER — Ambulatory Visit (INDEPENDENT_AMBULATORY_CARE_PROVIDER_SITE_OTHER): Payer: 59 | Admitting: Family Medicine

## 2017-03-18 VITALS — BP 128/72 | HR 60 | Temp 97.7°F | Resp 18 | Ht 62.0 in | Wt 240.6 lb

## 2017-03-18 DIAGNOSIS — J4521 Mild intermittent asthma with (acute) exacerbation: Secondary | ICD-10-CM | POA: Diagnosis not present

## 2017-03-18 DIAGNOSIS — E785 Hyperlipidemia, unspecified: Secondary | ICD-10-CM | POA: Diagnosis not present

## 2017-03-18 DIAGNOSIS — Z23 Encounter for immunization: Secondary | ICD-10-CM

## 2017-03-18 DIAGNOSIS — G7000 Myasthenia gravis without (acute) exacerbation: Secondary | ICD-10-CM

## 2017-03-18 DIAGNOSIS — J3089 Other allergic rhinitis: Secondary | ICD-10-CM

## 2017-03-18 DIAGNOSIS — E559 Vitamin D deficiency, unspecified: Secondary | ICD-10-CM

## 2017-03-18 DIAGNOSIS — G629 Polyneuropathy, unspecified: Secondary | ICD-10-CM | POA: Diagnosis not present

## 2017-03-18 DIAGNOSIS — E1140 Type 2 diabetes mellitus with diabetic neuropathy, unspecified: Secondary | ICD-10-CM | POA: Diagnosis not present

## 2017-03-18 DIAGNOSIS — G44221 Chronic tension-type headache, intractable: Secondary | ICD-10-CM | POA: Diagnosis not present

## 2017-03-18 DIAGNOSIS — Z9884 Bariatric surgery status: Secondary | ICD-10-CM

## 2017-03-18 LAB — POCT GLYCOSYLATED HEMOGLOBIN (HGB A1C): HEMOGLOBIN A1C: 5.8

## 2017-03-18 MED ORDER — FLUTICASONE FUROATE-VILANTEROL 200-25 MCG/INH IN AEPB: 1.0000 | INHALATION_SPRAY | Freq: Every day | RESPIRATORY_TRACT | 0 refills | Status: DC

## 2017-03-18 MED ORDER — TOPIRAMATE 100 MG PO TABS: 100.0000 mg | ORAL_TABLET | Freq: Every day | ORAL | 1 refills | Status: DC

## 2017-03-18 MED ORDER — MONTELUKAST SODIUM 10 MG PO TABS: 10.0000 mg | ORAL_TABLET | Freq: Every day | ORAL | 1 refills | Status: DC

## 2017-03-18 MED ORDER — ROSUVASTATIN CALCIUM 5 MG PO TABS: 5.0000 mg | ORAL_TABLET | Freq: Every day | ORAL | 1 refills | Status: DC

## 2017-03-18 MED ORDER — SEMAGLUTIDE(0.25 OR 0.5MG/DOS) 2 MG/1.5ML ~~LOC~~ SOPN: 0.5000 mg | PEN_INJECTOR | SUBCUTANEOUS | 2 refills | Status: DC

## 2017-03-18 MED ORDER — DULOXETINE HCL 60 MG PO CPEP: 60.0000 mg | ORAL_CAPSULE | Freq: Every day | ORAL | 1 refills | Status: DC

## 2017-03-18 NOTE — Progress Notes (Signed)
Name: Jasmine Woods   MRN: [id number]    DOB: Nov 14, 1955   Date:03/18/2017       Progress Note  Subjective  Chief Complaint  Chief Complaint  Patient presents with  . Medication Refill    4 month F/U  . Diabetes    Checks once daily, preprandial Lowest-85 Average-103 Highest-120  . Asthma    Denies any symptoms  . Hyperlipidemia  . Sleep Apnea    Doing well with CPAP machine, sleeping 7 hours nightly  . Obesity    Has lost 3 pounds since last visit.    HPI  DMII: she is on diet only since bariatric surgery in 2014, she denies polyphagia, polydipsia or polyuria. She has been more active, walking at least 5000 steps daily. hgbA1C from  5.6% to 5.9% down to 5.7% and up to 5.8% again Neuropathy is still 2/10, constant, burning like on both feet, she is on Cymbalta and seems to improve symptoms, she could not tolerate gabapentin or Lyrica and also did not work. Glucose is at goal, but she is frustrated about the weight gain, we will try Ozempic  Hyperlipidemia: taking Crestor  , taking Co-Q 10 and has not noticed improvement of muscle aches,no chest pain or SOB. Last is at goal.   Asthma: she had a flare of asthma back in August, seen by NP Raelyn Ensign, she had an elevated d-dimer and CT scan was negative for PE, she was given prednisone and has been using rescue inhaler, however continues to have some pleuritic chest pain, we will give samples of Breo and monitor.   Myasthenia Gravis: diagnosed at [facility] by Dr. Burnett Harry, 09/2015, seronegative, only causing ptosis and double vision, no other symptoms. She had a negative chest CT for thymoma, and has started on medication ( Mestinon ), she states it works quickly but wears out very fast. She takes before driving and for work. Stable.    Chronic Tension Headaches: seen by Dr. Melrose Nakayama, taking Topamax 100 mg daily and needs refills. Currently doing well, no longer needs Fioricet and is doing well now. No nausea, vomiting, photophobia,  phonophobia   Abnormal CT thyroid: incidental finding of thyroid calcification of left lobe, she saw Endo and had negative biopsy.   Bariatric Surgery/Obesity: she had bariatric surgery back in 2014 , her weight was almost 300 lbs, went down to close to 200lbs, but is gradually gaining weight, she has resume activity, but still eating out about 4 times a week  OSA: she is wearing CPAP every night, all night, she does not wake up with headaches, and feels good during the day   Patient Active Problem List   Diagnosis Date Noted  . Special screening for malignant neoplasms, colon   . Epiretinal membrane (ERM) of left eye 09/09/2016  . Glaucoma, left eye 09/05/2016  . Abnormal imaging of thyroid 09/15/2015  . Seronegative myasthenia gravis ([place]) 08/08/2015  . Psoriasis 07/06/2015  . Stress incontinence 07/06/2015  . Chronic tension-type headache, intractable 05/29/2015  . Anxiety and depression 04/12/2015  . Asthma, mild intermittent 04/12/2015  . Bladder polyp 04/12/2015  . Carpal tunnel syndrome 04/12/2015  . Binocular vision disorder with diplopia 04/12/2015  . Dyslipidemia 04/12/2015  . H/O: HTN (hypertension) 04/12/2015  . Hemorrhoids, internal 04/12/2015  . Neuropathy 04/12/2015  . Nuclear sclerotic cataract 04/12/2015  . Morbid obesity ([place]) 04/12/2015  . Obstructive apnea 04/12/2015  . Perennial allergic rhinitis 04/12/2015  . 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 Type  2 diabetes, controlled, with neuropathy ([place]) 04/12/2015  . Vitamin D deficiency 04/12/2015  . Cephalalgia 08/16/2014  . Lump or mass in breast 06/14/2013  . Bariatric surgery status 10/12/2012    Past Surgical History:  Procedure Laterality Date  . ABDOMINAL HYSTERECTOMY  1991  . [place] SURGERY  2014  . CATARACT EXTRACTION W/PHACO Left 01/28/2017   Procedure: CATARACT EXTRACTION PHACO AND INTRAOCULAR LENS PLACEMENT (IOC);  Surgeon: Birder Robson, MD;  Location: [facility]  ORS;  Service: Ophthalmology;  Laterality: Left;  [place] 00:37 AP% 17.8 CDE 6.61 Fluid pack lot # [id number]  . COLONOSCOPY  2012  . COLONOSCOPY WITH PROPOFOL N/A 09/30/2016   Procedure: COLONOSCOPY WITH PROPOFOL;  Surgeon: Lucilla Lame, MD;  Location: [facility];  Service: Endoscopy;  Laterality: N/A;  diabetic - diet controlled sleep apnea  . KNEE SURGERY Right 1979-1980  . PARS PLANA VITRECTOMY Left 03/27/2016   Procedure: PARS PLANA VITRECTOMY WITH 25 GAUGE AND MEMBRANE PEEL, INTERNAL LIMITING MEMBRANE;  Surgeon: Hurman Horn, MD;  Location: [facility];  Service: Ophthalmology;  Laterality: Left;  . THYROID SURGERY     BX done was benign  . UPPER GI ENDOSCOPY    . VITRECTOMY Left 04/27/2016    Family History  Problem Relation Age of Onset  . Diabetes Mother   . Hypertension Mother   . Muscular dystrophy Sister   . Breast cancer Neg Hx     Social History   Social History  . Marital status: Married    Spouse name: N/A  . Number of children: N/A  . Years of education: N/A   Occupational History  . Not on file.   Social History Main Topics  . Smoking status: Former Smoker    Years: 3.00    Quit date: 06/25/1975  . Smokeless tobacco: Never Used     Comment: smoked as teenager  . Alcohol use No  . Drug use: No  . Sexual activity: Not Currently   Other Topics Concern  . Not on file   Social History Narrative  . No narrative on file     Current Outpatient Prescriptions:  .  acetaminophen (TYLENOL) 500 MG tablet, Take 1,000 mg by mouth every 6 (six) hours as needed for mild pain. Reported on 07/06/2015, Disp: , Rfl:  .  brimonidine (ALPHAGAN) 0.2 % ophthalmic solution, Place 1 drop into the left eye 2 (two) times daily. , Disp: , Rfl:  .  budesonide (RHINOCORT AQUA) 32 MCG/ACT nasal spray, Place 2 sprays into both nostrils daily. (Patient taking differently: Place 2 sprays into both nostrils at bedtime. ), Disp: 1 Bottle, Rfl: 5 .  Coenzyme Q10 (CO Q 10) 100 MG CAPS, Take  1 capsule by mouth daily., Disp: 30 capsule, Rfl: 2 .  Difluprednate (DUREZOL) 0.05 % EMUL, Apply to eye., Disp: , Rfl:  .  DULoxetine (CYMBALTA) 60 MG capsule, Take 1 capsule (60 mg total) by mouth daily., Disp: 90 capsule, Rfl: 1 .  glucose blood (ACCU-CHEK GUIDE) test strip, Use as instructed, Disp: 100 each, Rfl: 12 .  loratadine (CLARITIN) 10 MG tablet, Take 1 tablet (10 mg total) by mouth 2 (two) times daily., Disp: 60 tablet, Rfl: 0 .  montelukast (SINGULAIR) 10 MG tablet, Take 1 tablet (10 mg total) by mouth at bedtime., Disp: 90 tablet, Rfl: 1 .  moxifloxacin (VIGAMOX) 0.5 % ophthalmic solution, Place 1 drop into the left eye 3 (three) times daily., Disp: 3 mL, Rfl: 0 .  Multiple Vitamins-Minerals (MULTIVITAMIN WITH MINERALS)  tablet, Take 1 tablet by mouth daily., Disp: , Rfl:  .  Omega-3 Fatty Acids (FISH OIL) 1200 MG CAPS, Take 2 capsules by mouth daily., Disp: , Rfl:  .  pyridostigmine (MESTINON) 60 MG tablet, Take 60 mg by mouth 4 (four) times daily. 3 TO 4 TIMES DAILY, Disp: , Rfl: 4 .  rosuvastatin (CRESTOR) 5 MG tablet, Take 1 tablet (5 mg total) by mouth daily., Disp: 90 tablet, Rfl: 1 .  topiramate (TOPAMAX) 100 MG tablet, Take 1 tablet (100 mg total) by mouth at bedtime., Disp: 90 tablet, Rfl: 1 .  Travoprost, BAK Free, (TRAVATAN Z) 0.004 % SOLN ophthalmic solution, Apply to eye., Disp: , Rfl:  .  VENTOLIN HFA 108 (90 Base) MCG/ACT inhaler, INHALE 2 PUFFS BY MOUTH EVERY 4 HOURS AS NEEDED SHORTNESS OF BREATH/WHEEZING, Disp: 18 g, Rfl: 0 .  fluticasone furoate-vilanterol (BREO ELLIPTA) 200-25 MCG/INH AEPB, Inhale 1 puff into the lungs daily., Disp: 14 each, Rfl: 0  Allergies  Allergen Reactions  . Aspirin     Upset stomach   . Ace Inhibitors     cough  . Amlodipine     Edema of feet and hands  . Gabapentin     anxiety  . Pregabalin Other (See Comments)    anxiety     ROS  Constitutional: Negative for fever or significant  weight change.  Respiratory: Negative for  cough and shortness of breath.  Mild pleuritic chest pain  Cardiovascular: Negative for chest pain or palpitations.  Gastrointestinal: Negative for abdominal pain, no bowel changes.  Musculoskeletal: Negative for gait problem or joint swelling.  Skin: Negative for rash.  Neurological: Negative for dizziness, positive for intermittent  headache.  No other specific complaints in a complete review of systems (except as listed in HPI above).  Objective  Vitals:   03/18/17 1352  BP: 128/72  Pulse: 60  Resp: 18  Temp: 97.7 F (36.5 C)  TempSrc: Oral  SpO2: 95%  Weight: 240 lb 9.6 oz (109.1 kg)  Height: '5\' 2"'  (1.575 m)    Body mass index is 44.01 kg/m.  Physical Exam  Constitutional: Patient appears well-developed and well-nourished. Obese  No distress.  HEENT: head atraumatic, normocephalic, pupils equal and reactive to light,  neck supple, throat within normal limits Cardiovascular: Normal rate, regular rhythm and normal heart sounds.  No murmur heard. No BLE edema. Pulmonary/Chest: Effort normal and breath sounds normal. No respiratory distress. Abdominal: Soft.  There is no tenderness. Psychiatric: Patient has a normal mood and affect. behavior is normal. Judgment and thought content normal.  Recent Results (from the past 2160 hour(s))  Glucose, capillary     Status: Abnormal   Collection Time: 01/28/17  6:33 AM  Result Value Ref Range   Glucose-Capillary 106 (H) 65 - 99 mg/dL  CBC w/Diff/Platelet     Status: None   Collection Time: 02/03/17  9:30 AM  Result Value Ref Range   WBC 9.5 3.8 - 10.8 K/uL   RBC 4.82 3.80 - 5.10 MIL/uL   Hemoglobin 14.1 11.7 - 15.5 g/dL   HCT 42.3 35.0 - 45.0 %   MCV 87.8 80.0 - 100.0 fL   MCH 29.3 27.0 - 33.0 pg   MCHC 33.3 32.0 - 36.0 g/dL   RDW 13.4 11.0 - 15.0 %   Platelets 294 140 - 400 K/uL   MPV 9.7 7.5 - 12.5 fL   Neutro Abs 5,795 1,500 - 7,800 cells/uL   Lymphs Abs 2,755 850 - 3,900 cells/uL  Monocytes Absolute 760 200 - 950  cells/uL   Eosinophils Absolute 190 15 - 500 cells/uL   Basophils Absolute 0 0 - 200 cells/uL   Neutrophils Relative % 61 %   Lymphocytes Relative 29 %   Monocytes Relative 8 %   Eosinophils Relative 2 %   Basophils Relative 0 %   Smear Review Criteria for review not met   D-Dimer, Quantitative     Status: Abnormal   Collection Time: 02/03/17  9:30 AM  Result Value Ref Range   D-Dimer, Quant 0.67 (H) <0.50 mcg/mL FEU    Comment:   The D-Dimer test is used frequently to exclude an acute PE or DVT.  In patients with a low to moderate clinical risk assessment and a D-Dimer result <0.50 mcg/mL FEU, the likelihood of a PE or DVT is very low.  However, a thromboembolic event should not be excluded solely on the basis of the D-Dimer level.  Increased levels of D-Dimer are associated with a PE, DVT, DIC, malignancies, inflammation, sepsis, surgery, trauma, pregnancy, and advancing patient age. [Jama 2006 11:295(2): 253-664]   For additional information, please refer to: http://education.[facility].com/faq/FAQ149 (This link is being provided for information/ educational purposes only)     BUN     Status: Abnormal   Collection Time: 02/03/17  3:28 PM  Result Value Ref Range   BUN 23 (H) 6 - 20 mg/dL  Creatinine, serum     Status: None   Collection Time: 02/03/17  3:28 PM  Result Value Ref Range   Creatinine, Ser 0.60 0.44 - 1.00 mg/dL   GFR calc non Af Amer >60 >60 mL/min   GFR calc Af Amer >60 >60 mL/min    Comment: (NOTE) The eGFR has been calculated using the CKD EPI equation. This calculation has not been validated in all clinical situations. eGFR's persistently <60 mL/min signify possible Chronic Kidney Disease.   POCT HgB A1C     Status: None   Collection Time: 03/18/17  1:56 PM  Result Value Ref Range   Hemoglobin A1C 5.8       PHQ2/9: Depression screen [facility] 2/9 03/11/2017 09/05/2016 07/01/2016 03/22/2016 12/15/2015  Decreased Interest 0 0 0 0 0  Down,  Depressed, Hopeless 0 0 0 0 0  PHQ - 2 Score 0 0 0 0 0     Fall Risk: Fall Risk  03/11/2017 09/05/2016 07/01/2016 03/22/2016 12/15/2015  Falls in the past year? No No Yes No No  Number falls in past yr: - - 1 - -  Injury with Fall? - - No - -     Assessment & Plan  1. Type 2 diabetes, controlled, with neuropathy (HCC)  - POCT HgB A1C - DULoxetine (CYMBALTA) 60 MG capsule; Take 1 capsule (60 mg total) by mouth daily.  Dispense: 90 capsule; Refill: 1  Discussed medication to help with wait loss, we will try Ozempic and see if it will help . She denies MEN syndrome or personal history of pancreatitis  Tried metformin in the past and it caused diarrhea  2. Need for pneumococcal vaccine  - Pneumococcal polysaccharide vaccine 23-valent greater than or equal to 2yo subcutaneous/IM  3. Morbid obesity, unspecified obesity type ([place])  Lost a few pounds since last visit  4. Bariatric surgery status  Gradually gaining weight, but los a few pounds since last visit   5. Seronegative myasthenia gravis ([place])  Stable, sees neurologist at [facility]  6. Peripheral polyneuropathy  - DULoxetine (CYMBALTA) 60 MG capsule; Take 1  capsule (60 mg total) by mouth daily.  Dispense: 90 capsule; Refill: 1  7. Dyslipidemia  - rosuvastatin (CRESTOR) 5 MG tablet; Take 1 tablet (5 mg total) by mouth daily.  Dispense: 90 tablet; Refill: 1  8. Vitamin D deficiency  Still taking otc supplementation  9. Mild intermittent asthma with acute exacerbation  She was seen by Raquel Sarna, had CT chest and CXR, improved with prednisone but continues to have mild pleuritic chest pain, we will give her samples of Breo for two weeks  - fluticasone furoate-vilanterol (BREO ELLIPTA) 200-25 MCG/INH AEPB; Inhale 1 puff into the lungs daily.  Dispense: 14 each; Refill: 0  10. Perennial allergic rhinitis  - montelukast (SINGULAIR) 10 MG tablet; Take 1 tablet (10 mg total) by mouth at bedtime.  Dispense: 90 tablet; Refill: 1  11.  Chronic tension-type headache, intractable  - topiramate (TOPAMAX) 100 MG tablet; Take 1 tablet (100 mg total) by mouth at bedtime.  Dispense: 90 tablet; Refill: 1

## 2017-03-20 ENCOUNTER — Other Ambulatory Visit: Payer: Self-pay

## 2017-03-20 MED ORDER — INSULIN PEN NEEDLE 32G X 6 MM MISC: 1.0000 | Freq: Every day | 1 refills | Status: DC

## 2017-03-20 MED ORDER — LIRAGLUTIDE 18 MG/3ML ~~LOC~~ SOPN: 1.8000 mg | PEN_INJECTOR | Freq: Every day | SUBCUTANEOUS | 2 refills | Status: DC

## 2017-03-27 ENCOUNTER — Other Ambulatory Visit: Payer: Self-pay

## 2017-03-27 DIAGNOSIS — E1140 Type 2 diabetes mellitus with diabetic neuropathy, unspecified: Secondary | ICD-10-CM

## 2017-03-27 MED ORDER — DULAGLUTIDE 1.5 MG/0.5ML ~~LOC~~ SOAJ: 1.5000 mg | SUBCUTANEOUS | 2 refills | Status: DC

## 2017-04-08 ENCOUNTER — Encounter: Payer: Self-pay | Admitting: Family Medicine

## 2017-06-11 ENCOUNTER — Encounter: Payer: Self-pay | Admitting: Family Medicine

## 2017-06-11 ENCOUNTER — Ambulatory Visit (INDEPENDENT_AMBULATORY_CARE_PROVIDER_SITE_OTHER): Payer: 59 | Admitting: Family Medicine

## 2017-06-11 VITALS — BP 134/82 | HR 71 | Temp 98.4°F | Resp 16 | Ht 62.0 in | Wt 240.6 lb

## 2017-06-11 DIAGNOSIS — G7000 Myasthenia gravis without (acute) exacerbation: Secondary | ICD-10-CM

## 2017-06-11 DIAGNOSIS — E1140 Type 2 diabetes mellitus with diabetic neuropathy, unspecified: Secondary | ICD-10-CM

## 2017-06-11 DIAGNOSIS — J4521 Mild intermittent asthma with (acute) exacerbation: Secondary | ICD-10-CM | POA: Diagnosis not present

## 2017-06-11 DIAGNOSIS — J320 Chronic maxillary sinusitis: Secondary | ICD-10-CM

## 2017-06-11 DIAGNOSIS — J3089 Other allergic rhinitis: Secondary | ICD-10-CM

## 2017-06-11 DIAGNOSIS — Z9884 Bariatric surgery status: Secondary | ICD-10-CM

## 2017-06-11 DIAGNOSIS — E785 Hyperlipidemia, unspecified: Secondary | ICD-10-CM | POA: Diagnosis not present

## 2017-06-11 LAB — POCT UA - MICROALBUMIN: Microalbumin Ur, POC: 20 mg/L

## 2017-06-11 LAB — POCT GLYCOSYLATED HEMOGLOBIN (HGB A1C): HEMOGLOBIN A1C: 5.6

## 2017-06-11 MED ORDER — AMOXICILLIN-POT CLAVULANATE 875-125 MG PO TABS: 1.0000 | ORAL_TABLET | Freq: Two times a day (BID) | ORAL | 0 refills | Status: DC

## 2017-06-11 MED ORDER — DULAGLUTIDE 1.5 MG/0.5ML ~~LOC~~ SOAJ: 1.5000 mg | SUBCUTANEOUS | 2 refills | Status: DC

## 2017-06-11 NOTE — Progress Notes (Signed)
[id number]

## 2017-06-11 NOTE — Progress Notes (Signed)
Name: Jasmine Woods   MRN: [id number]    DOB: [birth year]   Date:06/11/2017       Progress Note  Subjective  Chief Complaint  Chief Complaint  Patient presents with  . Medication Refill    3 month F/U  . Diabetes    Checks every morning, pre-prandial in the 90's  . Asthma    Has had a recent cold and had a lot of coughing and congestion  . Hyperlipidemia  . Sleep Apnea  . Obesity    Has losted 3 pounds since last visit    HPI  DMII:  She was off medication after bariatric surgery in 2014, but we re-started medication 02/2017 she denies polyphagia, polydipsia or polyuria. She has been more active, walking at least 5000 steps daily. hgbA1C from 5.6% to 5.9% down to 5.7% and up to 5.8% today is 5.6%  . Neuropathy is still 2/10, constant, burning- like on both feet, she is on Cymbalta and seems to improve symptoms, she could not tolerate gabapentin or Lyrica and also did not work. Glucose is at goal, fsbs at home around 90 fasting. Urine micro negative  Hyperlipidemia: taking Crestor  , taking Co-Q 10 and has noticed improvement of muscle aches, no chest pain or SOB.   Asthma: she had a flare of asthma back in August, seen by NP Raelyn Ensign, developed URI symptoms a few weeks ago and did not come in, but had a lot of cough, wheezing and SOB, she resumed Ventolin rescue inhaler and mucinex, she is doing better now, but continues to have nasal congestion, facial pressure, post-nasal drainage. No fever or chills.   Myasthenia Gravis: diagnosed at [facility] by Dr. Burnett Harry, 09/2015, seronegative, only causing ptosis and double vision, no other symptoms. She had a negative chest CT for thymoma, and has started on medication ( Mestinon ), she states it works quickly but wears out very fast. She takes before driving and for work. Stable.    Chronic Tension Headaches: seen by Dr. Melrose Nakayama, taking Topamax 100 mg daily and is working well for her. Currently doing well, no longer needs Fioricet  and is doing well now. No nausea, vomiting, photophobia, phonophobia   Abnormal CT thyroid: incidental finding of thyroid calcification of left lobe, she saw Endo and had negative biopsy.   Bariatric Surgery/Obesity: she had bariatric surgery back in 2014 , her weight was almost 300 lbs, went down to close to 200lbs, but was  gradually gaining weight, she has resume activity, she was started on Trulicity 76/2831 and lost 4 lbs since. She is trying not to eat out as often, down to twice a week.   OSA: she is wearing CPAP every night, all night, she does not wake up with headaches, and feels good during the day  Patient Active Problem List   Diagnosis Date Noted  . Special screening for malignant neoplasms, colon   . Epiretinal membrane (ERM) of left eye 09/09/2016  . Glaucoma, left eye 09/05/2016  . Abnormal imaging of thyroid 09/15/2015  . Seronegative myasthenia gravis ([place]) 08/08/2015  . Psoriasis 07/06/2015  . Stress incontinence 07/06/2015  . Chronic tension-type headache, intractable 05/29/2015  . Anxiety and depression 04/12/2015  . Asthma, mild intermittent 04/12/2015  . Bladder polyp 04/12/2015  . Carpal tunnel syndrome 04/12/2015  . Binocular vision disorder with diplopia 04/12/2015  . Dyslipidemia 04/12/2015  . H/O: HTN (hypertension) 04/12/2015  . Hemorrhoids, internal 04/12/2015  . Neuropathy 04/12/2015  . Nuclear sclerotic cataract 04/12/2015  .  Morbid obesity ([place]) 04/12/2015  . Obstructive apnea 04/12/2015  . Perennial allergic rhinitis 04/12/2015  . 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 Type 2 diabetes, controlled, with neuropathy ([place]) 04/12/2015  . Vitamin D deficiency 04/12/2015  . Cephalalgia 08/16/2014  . Lump or mass in breast 06/14/2013  . Bariatric surgery status 10/12/2012    Past Surgical History:  Procedure Laterality Date  . ABDOMINAL HYSTERECTOMY  1991  . [place] SURGERY  2014  . CATARACT EXTRACTION W/PHACO Left  01/28/2017   Procedure: CATARACT EXTRACTION PHACO AND INTRAOCULAR LENS PLACEMENT (IOC);  Surgeon: Birder Robson, MD;  Location: [facility];  Service: Ophthalmology;  Laterality: Left;  [place] 00:37 AP% 17.8 CDE 6.61 Fluid pack lot # [id number]  . COLONOSCOPY  2012  . COLONOSCOPY WITH PROPOFOL N/A 09/30/2016   Procedure: COLONOSCOPY WITH PROPOFOL;  Surgeon: Lucilla Lame, MD;  Location: [place];  Service: Endoscopy;  Laterality: N/A;  diabetic - diet controlled sleep apnea  . KNEE SURGERY Right 1979-1980  . PARS PLANA VITRECTOMY Left 03/27/2016   Procedure: PARS PLANA VITRECTOMY WITH 25 GAUGE AND MEMBRANE PEEL, INTERNAL LIMITING MEMBRANE;  Surgeon: Hurman Horn, MD;  Location: [place];  Service: Ophthalmology;  Laterality: Left;  . THYROID SURGERY     BX done was benign  . UPPER GI ENDOSCOPY    . VITRECTOMY Left 04/27/2016    Family History  Problem Relation Age of Onset  . Diabetes Mother   . Hypertension Mother   . Muscular dystrophy Sister   . Breast cancer Neg Hx     Social History   Socioeconomic History  . Marital status: Married    Spouse name: Not on file  . Number of children: Not on file  . Years of education: Not on file  . Highest education level: Not on file  Social Needs  . Financial resource strain: Not on file  . Food insecurity - worry: Not on file  . Food insecurity - inability: Not on file  . Transportation needs - medical: Not on file  . Transportation needs - non-medical: Not on file  Occupational History  . Not on file  Tobacco Use  . Smoking status: Former Smoker    Years: 3.00    Last attempt to quit: 06/25/1975    Years since quitting: 41.9  . Smokeless tobacco: Never Used  . Tobacco comment: smoked as teenager  Substance and Sexual Activity  . Alcohol use: No    Alcohol/week: 0.0 oz  . Drug use: No  . Sexual activity: Not Currently  Other Topics Concern  . Not on file  Social History Narrative  . Not on file     Current Outpatient  Medications:  .  acetaminophen (TYLENOL) 500 MG tablet, Take 1,000 mg by mouth every 6 (six) hours as needed for mild pain. Reported on 07/06/2015, Disp: , Rfl:  .  brimonidine (ALPHAGAN) 0.2 % ophthalmic solution, , Disp: , Rfl: 5 .  budesonide (RHINOCORT AQUA) 32 MCG/ACT nasal spray, Place 2 sprays into both nostrils daily. (Patient taking differently: Place 2 sprays into both nostrils at bedtime. ), Disp: 1 Bottle, Rfl: 5 .  Coenzyme Q10 (CO Q 10) 100 MG CAPS, Take 1 capsule by mouth daily., Disp: 30 capsule, Rfl: 2 .  Difluprednate (DUREZOL) 0.05 % EMUL, Apply to eye., Disp: , Rfl:  .  Dulaglutide (TRULICITY) 1.5 CH/8.5ID SOPN, Inject 1.5 mg into the skin once a week., Disp: 4 pen, Rfl: 2 .  DULoxetine (CYMBALTA)  60 MG capsule, Take 1 capsule (60 mg total) by mouth daily., Disp: 90 capsule, Rfl: 1 .  glucose blood (ACCU-CHEK GUIDE) test strip, Use as instructed, Disp: 100 each, Rfl: 12 .  loratadine (CLARITIN) 10 MG tablet, Take 1 tablet (10 mg total) by mouth 2 (two) times daily., Disp: 60 tablet, Rfl: 0 .  montelukast (SINGULAIR) 10 MG tablet, Take 1 tablet (10 mg total) by mouth at bedtime., Disp: 90 tablet, Rfl: 1 .  moxifloxacin (VIGAMOX) 0.5 % ophthalmic solution, Place 1 drop into the left eye 3 (three) times daily., Disp: 3 mL, Rfl: 0 .  Multiple Vitamins-Minerals (MULTIVITAMIN WITH MINERALS) tablet, Take 1 tablet by mouth daily., Disp: , Rfl:  .  Omega-3 Fatty Acids (FISH OIL) 1200 MG CAPS, Take 2 capsules by mouth daily., Disp: , Rfl:  .  pyridostigmine (MESTINON) 60 MG tablet, Take 60 mg by mouth 4 (four) times daily. 3 TO 4 TIMES DAILY, Disp: , Rfl: 4 .  rosuvastatin (CRESTOR) 5 MG tablet, Take 1 tablet (5 mg total) by mouth daily., Disp: 90 tablet, Rfl: 1 .  topiramate (TOPAMAX) 100 MG tablet, Take 1 tablet (100 mg total) by mouth at bedtime., Disp: 90 tablet, Rfl: 1 .  Travoprost, BAK Free, (TRAVATAN Z) 0.004 % SOLN ophthalmic solution, Apply to eye., Disp: , Rfl:  .  VENTOLIN HFA  108 (90 Base) MCG/ACT inhaler, INHALE 2 PUFFS BY MOUTH EVERY 4 HOURS AS NEEDED SHORTNESS OF BREATH/WHEEZING, Disp: 18 g, Rfl: 0  Allergies  Allergen Reactions  . Aspirin     Upset stomach   . Ace Inhibitors     cough  . Amlodipine     Edema of feet and hands  . Gabapentin     anxiety  . Pregabalin Other (See Comments)    anxiety     ROS  Constitutional: Negative for fever or weight change.  Respiratory: Negative for cough and shortness of breath.   Cardiovascular: Negative for chest pain or palpitations.  Gastrointestinal: Negative for abdominal pain, no bowel changes.  Musculoskeletal: Negative for gait problem or joint swelling.  Skin: Negative for rash.  Neurological: Negative for dizziness or headache.  No other specific complaints in a complete review of systems (except as listed in HPI above).   Objective  Vitals:   06/11/17 1342  BP: 134/82  Pulse: 71  Resp: 16  Temp: 98.4 F (36.9 C)  TempSrc: Oral  SpO2: 99%  Weight: 240 lb 9.6 oz (109.1 kg)  Height: 5\' 2"  (1.575 m)    Body mass index is 44.01 kg/m.  Physical Exam  Constitutional: Patient appears well-developed and well-nourished. Obese  No distress.  HEENT: head atraumatic, normocephalic, pupils equal and reactive to light,  neck supple, throat within normal limits, tender right maxillary sinus  Cardiovascular: Normal rate, regular rhythm and normal heart sounds.  No murmur heard. No BLE edema. Pulmonary/Chest: Effort normal and breath sounds normal. No respiratory distress. Abdominal: Soft.  There is no tenderness. Psychiatric: Patient has a normal mood and affect. behavior is normal. Judgment and thought content normal.  Recent Results (from the past 2160 hour(s))  POCT HgB A1C     Status: None   Collection Time: 03/18/17  1:56 PM  Result Value Ref Range   Hemoglobin A1C 5.8   POCT HgB A1C     Status: Normal   Collection Time: 06/11/17  1:46 PM  Result Value Ref Range   Hemoglobin A1C 5.6    POCT UA - Microalbumin  Status: None   Collection Time: 06/11/17  1:46 PM  Result Value Ref Range   Microalbumin Ur, POC 20 mg/L   Creatinine, POC  mg/dL   Albumin/Creatinine Ratio, Urine, POC       PHQ2/9: Depression screen [facility] 2/9 06/11/2017 03/11/2017 09/05/2016 07/01/2016 03/22/2016  Decreased Interest 0 0 0 0 0  Down, Depressed, Hopeless 0 0 0 0 0  PHQ - 2 Score 0 0 0 0 0    Fall Risk: Fall Risk  06/11/2017 03/11/2017 09/05/2016 07/01/2016 03/22/2016  Falls in the past year? Yes No No Yes No  Number falls in past yr: 2 or more - - 1 -  Injury with Fall? No - - No -    Functional Status Survey: Is the patient deaf or have difficulty hearing?: No Does the patient have difficulty seeing, even when wearing glasses/contacts?: No Does the patient have difficulty concentrating, remembering, or making decisions?: No Does the patient have difficulty walking or climbing stairs?: No Does the patient have difficulty dressing or bathing?: No Does the patient have difficulty doing errands alone such as visiting a doctor's office or shopping?: No    Assessment & Plan  1. Type 2 diabetes, controlled, with neuropathy ([place])  - POCT HgB A1C - POCT UA - Microalbumin  2. Mild intermittent asthma   - Spirometry with Graph Normal spirometry, doing better now  3. Morbid obesity, unspecified obesity type ([place])  Lost 4 lbs since last visit   4. Dyslipidemia  Continue statin therapy   5. Bariatric surgery status  Continue life style modification   6. Seronegative myasthenia gravis ([place])  Keep follow up at [facility]  7. Perennial allergic rhinitis  Continue medications  8. Right maxillary sinusitis  - amoxicillin-clavulanate (AUGMENTIN) 875-125 MG tablet; Take 1 tablet by mouth 2 (two) times daily.  Dispense: 28 tablet; Refill: 0

## 2017-06-20 DIAGNOSIS — G4733 Obstructive sleep apnea (adult) (pediatric): Secondary | ICD-10-CM | POA: Diagnosis not present

## 2017-09-08 DIAGNOSIS — H401122 Primary open-angle glaucoma, left eye, moderate stage: Secondary | ICD-10-CM | POA: Diagnosis not present

## 2017-09-10 ENCOUNTER — Ambulatory Visit: Payer: 59 | Admitting: Family Medicine

## 2017-09-24 ENCOUNTER — Other Ambulatory Visit: Payer: Self-pay | Admitting: Family Medicine

## 2017-10-17 ENCOUNTER — Encounter: Payer: Self-pay | Admitting: Family Medicine

## 2017-10-17 ENCOUNTER — Ambulatory Visit: Payer: 59 | Admitting: Family Medicine

## 2017-10-17 VITALS — BP 172/78 | HR 71 | Temp 98.3°F | Resp 16 | Ht 62.0 in | Wt 234.9 lb

## 2017-10-17 DIAGNOSIS — E785 Hyperlipidemia, unspecified: Secondary | ICD-10-CM

## 2017-10-17 DIAGNOSIS — J4520 Mild intermittent asthma, uncomplicated: Secondary | ICD-10-CM

## 2017-10-17 DIAGNOSIS — Z9884 Bariatric surgery status: Secondary | ICD-10-CM

## 2017-10-17 DIAGNOSIS — J3089 Other allergic rhinitis: Secondary | ICD-10-CM

## 2017-10-17 DIAGNOSIS — R030 Elevated blood-pressure reading, without diagnosis of hypertension: Secondary | ICD-10-CM

## 2017-10-17 DIAGNOSIS — G629 Polyneuropathy, unspecified: Secondary | ICD-10-CM | POA: Diagnosis not present

## 2017-10-17 DIAGNOSIS — M25511 Pain in right shoulder: Secondary | ICD-10-CM | POA: Diagnosis not present

## 2017-10-17 DIAGNOSIS — G44221 Chronic tension-type headache, intractable: Secondary | ICD-10-CM | POA: Diagnosis not present

## 2017-10-17 DIAGNOSIS — E559 Vitamin D deficiency, unspecified: Secondary | ICD-10-CM | POA: Diagnosis not present

## 2017-10-17 DIAGNOSIS — E1140 Type 2 diabetes mellitus with diabetic neuropathy, unspecified: Secondary | ICD-10-CM

## 2017-10-17 DIAGNOSIS — E042 Nontoxic multinodular goiter: Secondary | ICD-10-CM

## 2017-10-17 DIAGNOSIS — G7000 Myasthenia gravis without (acute) exacerbation: Secondary | ICD-10-CM | POA: Diagnosis not present

## 2017-10-17 LAB — TSH: TSH: 0.86 mIU/L (ref 0.40–4.50)

## 2017-10-17 LAB — HEMOGLOBIN A1C
HEMOGLOBIN A1C: 5.5 %{Hb} (ref <5.7)
Mean Plasma Glucose: 111 (calc)
eAG (mmol/L): 6.2 (calc)

## 2017-10-17 MED ORDER — DICLOFENAC SODIUM 1 % TD GEL: 4.0000 g | Freq: Four times a day (QID) | TRANSDERMAL | 0 refills | Status: DC

## 2017-10-17 MED ORDER — DULAGLUTIDE 1.5 MG/0.5ML ~~LOC~~ SOAJ: 1.5000 mg | SUBCUTANEOUS | 2 refills | Status: DC

## 2017-10-17 MED ORDER — MONTELUKAST SODIUM 10 MG PO TABS: 10.0000 mg | ORAL_TABLET | Freq: Every day | ORAL | 1 refills | Status: DC

## 2017-10-17 MED ORDER — ROSUVASTATIN CALCIUM 5 MG PO TABS: 5.0000 mg | ORAL_TABLET | Freq: Every day | ORAL | 1 refills | Status: DC

## 2017-10-17 MED ORDER — DULOXETINE HCL 60 MG PO CPEP: 60.0000 mg | ORAL_CAPSULE | Freq: Every day | ORAL | 1 refills | Status: DC

## 2017-10-17 NOTE — Progress Notes (Signed)
Name: Jasmine Woods   MRN: [id number]    DOB: July 20, 1955   Date:10/17/2017       Progress Note  Subjective  Chief Complaint  Chief Complaint  Patient presents with  . Diabetes    Checks BS Lowest-92 Average-97 Highest-110-before meals  . Asthma    Doing better  . Hyperlipidemia  . Obesity    Has lost 7 pounds since last visit- doing better with diet   . Sleep Apnea  . Medication Refill    4 month F/U  . Shoulder Pain    Onset-couple of weeks, right shoulder- sore and worst at nighttime    HPI  DMII:  She was off medication after bariatric surgery in 2014, but we re-started medication 02/2017 she denies polyphagia, polydipsia or polyuria. She has been more active, walking at least 6000 steps daily. hgbA1C from 5.6% to 5.9% down to 5.7%and up to 5.8% and last one was 5.6%.  Neuropathy is still 2/10, constant, burning- like on both feet, she is on Cymbalta and seems to improve symptoms, she could not tolerate gabapentin or Lyrica and also did not work. Glucose is at goal, fsbs at home and average for the past 30 days has been 97, no hypoglycemia.   Hyperlipidemia: takingCrestor, taking Co-Q 10 and has noticed improvement of muscle aches, no chest pain or SOB. We will recheck labs.   Asthma: she is doing very well now, no cough, wheezing or SOB  Myasthenia Gravis: diagnosed at [facility] by Dr. Burnett Harry, 09/2015, seronegative, only causing ptosis and double vision, no other symptoms. She had a negative chest CT for thymoma, and has started on medication ( Mestinon ), she states it works quickly but wears out very fast. She takes before driving and for work.Does not usually bother her to have double vision when at home.   Chronic Tension Headaches: seen by Dr. Melrose Nakayama, taking Topamax 100 mg daily and is working well for her, she states she would like to stop medications now. No nausea, vomiting, photophobia, phonophobia associated with headache   Abnormal CT thyroid:  incidental finding of thyroid calcification of left lobe, she saw Endo and had negative biopsy., she had an US done one year ago, accidentally given levothyroxine by endo and caused TSH suppression, she is afraid of going back, but due for repeat US and we will order now  Bariatric Surgery/Obesity: she had bariatric surgery back in 2014 , her weight was almost 300 lbs, went down to close to 200lbs, but was  gradually gaining weight,she was started on Trulicity 22/2979 and lost 10 lbs since. She states medication seems to curb her appetite. She has been walking at least 6000 steps a day  OSA: she is wearing CPAP every night, all night, she does not wake up with headaches, and feels good during the day. Unchanged   Shoulder pain: she denies any trauma, but has noticed a dull pain on right anterior shoulder, worse at night and when not moving, not affected by activity. She cannot take oral nsaid's because of history of gastric bypass. We will try topical medication and consider referral to PT   Elevated BP: she has a history of HTN, but she does not want to start it again today. She will check it at work and if bp does not improve she will contact me again   Patient Active Problem List   Diagnosis Date Noted  . Special screening for malignant neoplasms, colon   . Epiretinal membrane (ERM) of left  eye 09/09/2016  . Glaucoma, left eye 09/05/2016  . Abnormal imaging of thyroid 09/15/2015  . Seronegative myasthenia gravis ([place]) 08/08/2015  . Psoriasis 07/06/2015  . Stress incontinence 07/06/2015  . Chronic tension-type headache, intractable 05/29/2015  . Anxiety and depression 04/12/2015  . Asthma, mild intermittent 04/12/2015  . Bladder polyp 04/12/2015  . Carpal tunnel syndrome 04/12/2015  . Binocular vision disorder with diplopia 04/12/2015  . Dyslipidemia 04/12/2015  . H/O: HTN (hypertension) 04/12/2015  . Hemorrhoids, internal 04/12/2015  . Neuropathy 04/12/2015  . Nuclear sclerotic  cataract 04/12/2015  . Morbid obesity ([place]) 04/12/2015  . Obstructive apnea 04/12/2015  . Perennial allergic rhinitis 04/12/2015  . 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 Type 2 diabetes, controlled, with neuropathy ([place]) 04/12/2015  . Vitamin D deficiency 04/12/2015  . Cephalalgia 08/16/2014  . Lump or mass in breast 06/14/2013  . Bariatric surgery status 10/12/2012    Past Surgical History:  Procedure Laterality Date  . ABDOMINAL HYSTERECTOMY  1991  . [place] SURGERY  2014  . CATARACT EXTRACTION W/PHACO Left 01/28/2017   Procedure: CATARACT EXTRACTION PHACO AND INTRAOCULAR LENS PLACEMENT (IOC);  Surgeon: Birder Robson, MD;  Location: [facility];  Service: Ophthalmology;  Laterality: Left;  [place] 00:37 AP% 17.8 CDE 6.61 Fluid pack lot # [id number]  . COLONOSCOPY  2012  . COLONOSCOPY WITH PROPOFOL N/A 09/30/2016   Procedure: COLONOSCOPY WITH PROPOFOL;  Surgeon: Lucilla Lame, MD;  Location: [place];  Service: Endoscopy;  Laterality: N/A;  diabetic - diet controlled sleep apnea  . KNEE SURGERY Right 1979-1980  . PARS PLANA VITRECTOMY Left 03/27/2016   Procedure: PARS PLANA VITRECTOMY WITH 25 GAUGE AND MEMBRANE PEEL, INTERNAL LIMITING MEMBRANE;  Surgeon: Hurman Horn, MD;  Location: [place];  Service: Ophthalmology;  Laterality: Left;  . THYROID SURGERY     BX done was benign  . UPPER GI ENDOSCOPY    . VITRECTOMY Left 04/27/2016    Family History  Problem Relation Age of Onset  . Diabetes Mother   . Hypertension Mother   . Muscular dystrophy Sister   . Breast cancer Neg Hx     Social History   Socioeconomic History  . Marital status: Married    Spouse name: Not on file  . Number of children: Not on file  . Years of education: Not on file  . Highest education level: Not on file  Occupational History  . Not on file  Social Needs  . Financial resource strain: Not on file  . Food insecurity:    Worry: Not on file    Inability: Not on file   . Transportation needs:    Medical: Not on file    Non-medical: Not on file  Tobacco Use  . Smoking status: Former Smoker    Years: 3.00    Last attempt to quit: 06/25/1975    Years since quitting: 42.3  . Smokeless tobacco: Never Used  . Tobacco comment: smoked as teenager  Substance and Sexual Activity  . Alcohol use: No    Alcohol/week: 0.0 oz  . Drug use: No  . Sexual activity: Not Currently  Lifestyle  . Physical activity:    Days per week: Not on file    Minutes per session: Not on file  . Stress: Not on file  Relationships  . Social connections:    Talks on phone: Not on file    Gets together: Not on file    Attends religious service: Not on file  Active member of club or organization: Not on file    Attends meetings of clubs or organizations: Not on file    Relationship status: Not on file  . Intimate partner violence:    Fear of current or ex partner: Not on file    Emotionally abused: Not on file    Physically abused: Not on file    Forced sexual activity: Not on file  Other Topics Concern  . Not on file  Social History Narrative  . Not on file     Current Outpatient Medications:  .  acetaminophen (TYLENOL) 500 MG tablet, Take 1,000 mg by mouth every 6 (six) hours as needed for mild pain. Reported on 07/06/2015, Disp: , Rfl:  .  brimonidine (ALPHAGAN) 0.2 % ophthalmic solution, Place 1 drop into the left eye 2 (two) times daily. , Disp: , Rfl: 5 .  budesonide (RHINOCORT AQUA) 32 MCG/ACT nasal spray, Place 2 sprays into both nostrils daily. (Patient taking differently: Place 2 sprays into both nostrils at bedtime. ), Disp: 1 Bottle, Rfl: 5 .  Coenzyme Q10 (CO Q 10) 100 MG CAPS, Take 1 capsule by mouth daily., Disp: 30 capsule, Rfl: 2 .  Difluprednate (DUREZOL) 0.05 % EMUL, Apply to eye., Disp: , Rfl:  .  Dulaglutide (TRULICITY) 1.5 OB/0.9GG SOPN, Inject 1.5 mg into the skin once a week., Disp: 4 pen, Rfl: 2 .  DULoxetine (CYMBALTA) 60 MG capsule, Take 1  capsule (60 mg total) by mouth daily., Disp: 90 capsule, Rfl: 1 .  glucose blood (ACCU-CHEK GUIDE) test strip, Use as instructed, Disp: 100 each, Rfl: 12 .  loratadine (CLARITIN) 10 MG tablet, Take 1 tablet (10 mg total) by mouth 2 (two) times daily., Disp: 60 tablet, Rfl: 0 .  montelukast (SINGULAIR) 10 MG tablet, Take 1 tablet (10 mg total) by mouth at bedtime., Disp: 90 tablet, Rfl: 1 .  Multiple Vitamins-Minerals (MULTIVITAMIN WITH MINERALS) tablet, Take 1 tablet by mouth daily., Disp: , Rfl:  .  Omega-3 Fatty Acids (FISH OIL) 1200 MG CAPS, Take 2 capsules by mouth daily., Disp: , Rfl:  .  pyridostigmine (MESTINON) 60 MG tablet, Take 60 mg by mouth 4 (four) times daily. 3 TO 4 TIMES DAILY, Disp: , Rfl: 4 .  rosuvastatin (CRESTOR) 5 MG tablet, Take 1 tablet (5 mg total) by mouth daily., Disp: 90 tablet, Rfl: 1 .  Travoprost, BAK Free, (TRAVATAN Z) 0.004 % SOLN ophthalmic solution, Place 1 drop into the left eye at bedtime. , Disp: , Rfl:  .  VENTOLIN HFA 108 (90 Base) MCG/ACT inhaler, INHALE 2 PUFFS BY MOUTH EVERY 4 HOURS AS NEEDED SHORTNESS OF BREATH/WHEEZING, Disp: 18 g, Rfl: 0  Allergies  Allergen Reactions  . Aspirin     Upset stomach   . Azithromycin     Contraindicated in Myasthenia   . Ace Inhibitors     cough  . Amlodipine     Edema of feet and hands  . Gabapentin     anxiety  . Pregabalin Other (See Comments)    anxiety     ROS  Constitutional: Negative for fever , positive for  weight change.  Respiratory: Negative for cough and shortness of breath.   Cardiovascular: Negative for chest pain or palpitations.  Gastrointestinal: Negative for abdominal pain, no bowel changes.  Musculoskeletal: Negative for gait problem or joint swelling.  Skin: Negative for rash.  Neurological: Negative for dizziness or headache.  No other specific complaints in a complete review of systems (except  as listed in HPI above).  Objective  Vitals:   10/17/17 1334  BP: (!) 162/84   Pulse: 71  Resp: 16  Temp: 98.3 F (36.8 C)  TempSrc: Oral  SpO2: 95%  Weight: 234 lb 14.4 oz (106.5 kg)  Height: 5\' 2"  (1.575 m)    Body mass index is 42.96 kg/m.  Physical Exam  Constitutional: Patient appears well-developed and well-nourished. Obese No distress.  HEENT: head atraumatic, normocephalic, pupils equal and reactive to light, neck supple, throat within normal limits Cardiovascular: Normal rate, regular rhythm and normal heart sounds.  No murmur heard. No BLE edema. Pulmonary/Chest: Effort normal and breath sounds normal. No respiratory distress. Abdominal: Soft.  There is no tenderness. Psychiatric: Patient has a normal mood and affect. behavior is normal. Judgment and thought content normal. Muscular Skeletal: she has normal rom of shoulder, but has pain during palpation of right anterior shoulder, no redness or swelling. Negative impingement    Diabetic Foot Exam: Diabetic Foot Exam - Simple   Simple Foot Form Diabetic Foot exam was performed with the following findings:  Yes 10/17/2017  1:50 PM  Visual Inspection No deformities, no ulcerations, no other skin breakdown bilaterally:  Yes Sensation Testing Intact to touch and monofilament testing bilaterally:  Yes Pulse Check Posterior Tibialis and Dorsalis pulse intact bilaterally:  Yes Comments      PHQ2/9: Depression screen [facility] 2/9 10/17/2017 06/11/2017 03/11/2017 09/05/2016 07/01/2016  Decreased Interest 0 0 0 0 0  Down, Depressed, Hopeless 0 0 0 0 0  PHQ - 2 Score 0 0 0 0 0     Fall Risk: Fall Risk  10/17/2017 06/11/2017 03/11/2017 09/05/2016 07/01/2016  Falls in the past year? No Yes No No Yes  Number falls in past yr: - 2 or more - - 1  Injury with Fall? - No - - No     Functional Status Survey: Is the patient deaf or have difficulty hearing?: No Does the patient have difficulty seeing, even when wearing glasses/contacts?: No Does the patient have difficulty concentrating, remembering, or making  decisions?: No Does the patient have difficulty walking or climbing stairs?: No Does the patient have difficulty dressing or bathing?: No   Assessment & Plan  1. Type 2 diabetes, controlled, with neuropathy ([place])  Doing well on medication. Losing well.  - Dulaglutide (TRULICITY) 1.5 XB/2.8UX SOPN; Inject 1.5 mg into the skin once a week.  Dispense: 4 pen; Refill: 2 - DULoxetine (CYMBALTA) 60 MG capsule; Take 1 capsule (60 mg total) by mouth daily.  Dispense: 90 capsule; Refill: 1 - Hemoglobin A1c  2. Peripheral polyneuropathy  - DULoxetine (CYMBALTA) 60 MG capsule; Take 1 capsule (60 mg total) by mouth daily.  Dispense: 90 capsule; Refill: 1  3. Perennial allergic rhinitis  - montelukast (SINGULAIR) 10 MG tablet; Take 1 tablet (10 mg total) by mouth at bedtime.  Dispense: 90 tablet; Refill: 1  4. Dyslipidemia  - rosuvastatin (CRESTOR) 5 MG tablet; Take 1 tablet (5 mg total) by mouth daily.  Dispense: 90 tablet; Refill: 1 - Lipid panel  5. Bariatric surgery status  - COMPLETE METABOLIC PANEL WITH GFR  6. Seronegative myasthenia gravis [facility])  Seeing neurologist goes to [facility]   7. Chronic tension-type headache, intractable  No longer having problems with headaches and wants to stop Topamax, advised to go down to 50 mg and stop after one week  8. Vitamin D deficiency  Start otc supplementation   9. Mild intermittent asthma without complication  Doing well  at this time  10. Morbid obesity, unspecified obesity type ([place])  Losing weight since last visit, doing much better with her diet  11. Elevated blood pressure reading  - COMPLETE METABOLIC PANEL WITH GFR - CBC with Differential/Platelet - TSH  12. Acute pain of right shoulder  - diclofenac sodium (VOLTAREN) 1 % GEL; Apply 4 g topically 4 (four) times daily.  Dispense: 100 g; Refill: 0  13. Multiple thyroid nodules  She used to go to [facility], but worried about going back since she was given  levothyroxine by accident. We will recheck thyroid US and if needed she would like to see a [facility] provider  - US Soft Tissue Head/Neck; Future

## 2017-10-18 LAB — COMPLETE METABOLIC PANEL WITH GFR
AG Ratio: 1.5 (calc) (ref 1.0–2.5)
ALBUMIN MSPROF: 4.3 g/dL (ref 3.6–5.1)
ALT: 18 U/L (ref 6–29)
AST: 17 U/L (ref 10–35)
Alkaline phosphatase (APISO): 41 U/L (ref 33–130)
BUN: 16 mg/dL (ref 7–25)
CALCIUM: 9.4 mg/dL (ref 8.6–10.4)
CO2: 28 mmol/L (ref 20–32)
CREATININE: 0.62 mg/dL (ref 0.50–0.99)
Chloride: 104 mmol/L (ref 98–110)
GFR, EST NON AFRICAN AMERICAN: 97 mL/min/{1.73_m2} (ref >=60)
GFR, Est African American: 112 mL/min/{1.73_m2} (ref >=60)
Globulin: 2.9 g/dL (calc) (ref 1.9–3.7)
Glucose, Bld: 90 mg/dL (ref 65–139)
Potassium: 3.9 mmol/L (ref 3.5–5.3)
Sodium: 140 mmol/L (ref 135–146)
Total Bilirubin: 0.5 mg/dL (ref 0.2–1.2)
Total Protein: 7.2 g/dL (ref 6.1–8.1)

## 2017-10-18 LAB — CBC WITH DIFFERENTIAL/PLATELET
Basophils Absolute: 64 {cells}/uL (ref 0–200)
Basophils Relative: 0.7 %
Eosinophils Absolute: 120 {cells}/uL (ref 15–500)
Eosinophils Relative: 1.3 %
HCT: 39.7 % (ref 35.0–45.0)
Hemoglobin: 14.2 g/dL (ref 11.7–15.5)
Lymphs Abs: 3386 {cells}/uL (ref 850–3900)
MCH: 29.7 pg (ref 27.0–33.0)
MCHC: 35.8 g/dL (ref 32.0–36.0)
MCV: 83.1 fL (ref 80.0–100.0)
MPV: 10.2 fL (ref 7.5–12.5)
Monocytes Relative: 7 %
Neutro Abs: 4986 {cells}/uL (ref 1500–7800)
Neutrophils Relative %: 54.2 %
Platelets: 311 Thousand/uL (ref 140–400)
RBC: 4.78 Million/uL (ref 3.80–5.10)
RDW: 12.5 % (ref 11.0–15.0)
Total Lymphocyte: 36.8 %
WBC mixed population: 644 {cells}/uL (ref 200–950)
WBC: 9.2 Thousand/uL (ref 3.8–10.8)

## 2017-10-18 LAB — LIPID PANEL
Cholesterol: 228 mg/dL — ABNORMAL HIGH (ref <200)
HDL: 62 mg/dL (ref >50)
LDL Cholesterol (Calc): 149 mg/dL (calc) — ABNORMAL HIGH
Non-HDL Cholesterol (Calc): 166 mg/dL (calc) — ABNORMAL HIGH (ref <130)
Total CHOL/HDL Ratio: 3.7 (calc) (ref <5.0)
Triglycerides: 71 mg/dL (ref <150)

## 2017-10-20 ENCOUNTER — Encounter: Payer: Self-pay | Admitting: Family Medicine

## 2017-10-21 ENCOUNTER — Ambulatory Visit: Payer: 59

## 2017-10-21 ENCOUNTER — Other Ambulatory Visit: Payer: Self-pay | Admitting: Family Medicine

## 2017-10-21 ENCOUNTER — Ambulatory Visit
Admission: RE | Admit: 2017-10-21 | Discharge: 2017-10-21 | Disposition: A | Discharge status: Home / Self Care | Payer: 59 | Source: Ambulatory Visit | Attending: Family Medicine | Admitting: Family Medicine

## 2017-10-21 DIAGNOSIS — I10 Essential (primary) hypertension: Secondary | ICD-10-CM

## 2017-10-21 DIAGNOSIS — E042 Nontoxic multinodular goiter: Secondary | ICD-10-CM

## 2017-10-21 MED ORDER — TELMISARTAN-HCTZ 40-12.5 MG PO TABS
1.0000 | ORAL_TABLET | Freq: Every day | ORAL | 0 refills | Status: DC
Start: 2017-10-21 — End: 2017-11-24

## 2017-10-29 ENCOUNTER — Other Ambulatory Visit: Payer: Self-pay | Admitting: Family Medicine

## 2017-10-29 DIAGNOSIS — Z1231 Encounter for screening mammogram for malignant neoplasm of breast: Secondary | ICD-10-CM

## 2017-10-30 ENCOUNTER — Encounter: Payer: Self-pay | Admitting: Family Medicine

## 2017-11-13 ENCOUNTER — Inpatient Hospital Stay: Admission: RE | Admit: 2017-11-13 | Payer: 59 | Source: Ambulatory Visit

## 2017-11-24 ENCOUNTER — Other Ambulatory Visit: Payer: Self-pay | Admitting: Family Medicine

## 2017-11-24 DIAGNOSIS — I10 Essential (primary) hypertension: Secondary | ICD-10-CM

## 2017-11-24 NOTE — Telephone Encounter (Signed)
Copied from [place] [id number]. Topic: Inquiry >> Nov 24, 2017  2:12 PM Pricilla Handler wrote: Reason for CRM: Patient's husband called stating that the patient is completely out of Telmisartan-hydrochlorothiazide (MICARDIS HCT) 40-12.5 MG tablet. Patient's husband wants this prescription sent to her pharmacy right away today. Their preferred pharmacy: [facility], [place] [phone] (Phone) [phone] (Fax)

## 2017-11-25 ENCOUNTER — Ambulatory Visit
Admission: RE | Admit: 2017-11-25 | Discharge: 2017-11-25 | Disposition: A | Discharge status: Home / Self Care | Payer: 59 | Source: Ambulatory Visit | Attending: Family Medicine | Admitting: Family Medicine

## 2017-11-25 DIAGNOSIS — Z1231 Encounter for screening mammogram for malignant neoplasm of breast: Secondary | ICD-10-CM

## 2017-11-26 ENCOUNTER — Encounter: Payer: Self-pay | Admitting: Endocrinology

## 2017-11-26 ENCOUNTER — Ambulatory Visit: Payer: 59 | Admitting: Endocrinology

## 2017-11-26 DIAGNOSIS — R61 Generalized hyperhidrosis: Secondary | ICD-10-CM | POA: Diagnosis not present

## 2017-11-26 NOTE — Patient Instructions (Addendum)
Your blood pressure is high today.  Please see your primary care provider soon, to have it rechecked. Let's check a 24-HR urine collection.  We'll let you know about the results.  Please let your PCP know if you want to have the swallowing checked. most of the time, a "lumpy thyroid" will eventually become overactive.  this is usually a slow process, happening over the span of many years. Please come back for a follow-up appointment in 1 year.

## 2017-11-26 NOTE — Progress Notes (Signed)
Subjective:    Patient ID: Jasmine Woods, female    DOB: [birth year], 62 y.o.   MRN: [id number]  HPI Pt is self- referred, for nodular thyroid.  Pt was incidentally noted in 2017, to have thyroid nodules.  She had bxs in 2017 and 2018, in [place].  she has no h/o XRT or surgery to the neck.  She has slight swelling of the neck, but no assoc pain.   Past Medical History:  Diagnosis Date  . Allergy   . Anxiety   . Asthma   . Bladder polyp    Dr. Ernst Spell  . Carpal tunnel syndrome   . Depression   . Diabetes mellitus without complication ([place])    Diet controlled  . Double vision    seen by Dr. 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 Dyslipidemia   . Frequency of urination   . H/O myasthenia gravis    MOSTLY AFFECTS EYES  . HA (headache)   . Heart murmur    followed by PCP  . Hx of bariatric surgery   . Hypertension    no meds  . Internal hemorrhoids   . Myasthenia gravis ([place])    "mostly effects eyes"  . Neuropathy, generalized    diabetic on feet  . Nuclear sclerotic cataract of both eyes   . Obesity (BMI 30-39.9)   . Obstructive sleep apnea on CPAP    uses cpap  . Patient is Jehovah's Witness    no blood products  . Rotator cuff tendonitis 07/2009   also has impingment right shoulder pain, seen by Dr. Mack Guise  . Sleep apnea   . Thyroid nodule   . Vaginal dryness, menopausal   . Vitamin D deficiency     Past Surgical History:  Procedure Laterality Date  . ABDOMINAL HYSTERECTOMY  1991  . [place] SURGERY  2014  . CATARACT EXTRACTION W/PHACO Left 01/28/2017   Procedure: CATARACT EXTRACTION PHACO AND INTRAOCULAR LENS PLACEMENT (IOC);  Surgeon: Birder Robson, MD;  Location: [facility];  Service: Ophthalmology;  Laterality: Left;  [place] 00:37 AP% 17.8 CDE 6.61 Fluid pack lot # [id number]  . COLONOSCOPY  2012  . COLONOSCOPY WITH PROPOFOL N/A 09/30/2016   Procedure: COLONOSCOPY WITH PROPOFOL;  Surgeon: Lucilla Lame, MD;  Location: [place];   Service: Endoscopy;  Laterality: N/A;  diabetic - diet controlled sleep apnea  . KNEE SURGERY Right 1979-1980  . PARS PLANA VITRECTOMY Left 03/27/2016   Procedure: PARS PLANA VITRECTOMY WITH 25 GAUGE AND MEMBRANE PEEL, INTERNAL LIMITING MEMBRANE;  Surgeon: Hurman Horn, MD;  Location: [place];  Service: Ophthalmology;  Laterality: Left;  . THYROID SURGERY     BX done was benign  . UPPER GI ENDOSCOPY    . VITRECTOMY Left 04/27/2016    Social History   Socioeconomic History  . Marital status: Married    Spouse name: Not on file  . Number of children: Not on file  . Years of education: Not on file  . Highest education level: Not on file  Occupational History  . Not on file  Social Needs  . Financial resource strain: Not on file  . Food insecurity:    Worry: Not on file    Inability: Not on file  . Transportation needs:    Medical: Not on file    Non-medical: Not on file  Tobacco Use  . Smoking status: Former Smoker    Years: 3.00    Last attempt to quit: 06/25/1975  Years since quitting: 42.4  . Smokeless tobacco: Never Used  . Tobacco comment: smoked as teenager  Substance and Sexual Activity  . Alcohol use: No    Alcohol/week: 0.0 oz  . Drug use: No  . Sexual activity: Not Currently  Lifestyle  . Physical activity:    Days per week: Not on file    Minutes per session: Not on file  . Stress: Not on file  Relationships  . Social connections:    Talks on phone: Not on file    Gets together: Not on file    Attends religious service: Not on file    Active member of club or organization: Not on file    Attends meetings of clubs or organizations: Not on file    Relationship status: Not on file  . Intimate partner violence:    Fear of current or ex partner: Not on file    Emotionally abused: Not on file    Physically abused: Not on file    Forced sexual activity: Not on file  Other Topics Concern  . Not on file  Social History Narrative  . Not on file    Current  Outpatient Medications on File Prior to Visit  Medication Sig Dispense Refill  . acetaminophen (TYLENOL) 500 MG tablet Take 1,000 mg by mouth every 6 (six) hours as needed for mild pain. Reported on 07/06/2015    . brimonidine (ALPHAGAN) 0.2 % ophthalmic solution Place 1 drop into the left eye 2 (two) times daily.   5  . budesonide (RHINOCORT AQUA) 32 MCG/ACT nasal spray Place 2 sprays into both nostrils daily. (Patient taking differently: Place 2 sprays into both nostrils at bedtime. ) 1 Bottle 5  . Coenzyme Q10 (CO Q 10) 100 MG CAPS Take 1 capsule by mouth daily. 30 capsule 2  . diclofenac sodium (VOLTAREN) 1 % GEL Apply 4 g topically 4 (four) times daily. 100 g 0  . Dulaglutide (TRULICITY) 1.5 ZO/1.0RU SOPN Inject 1.5 mg into the skin once a week. 4 pen 2  . DULoxetine (CYMBALTA) 60 MG capsule Take 1 capsule (60 mg total) by mouth daily. 90 capsule 1  . glucose blood (ACCU-CHEK GUIDE) test strip Use as instructed 100 each 12  . loratadine (CLARITIN) 10 MG tablet Take 1 tablet (10 mg total) by mouth 2 (two) times daily. 60 tablet 0  . montelukast (SINGULAIR) 10 MG tablet Take 1 tablet (10 mg total) by mouth at bedtime. 90 tablet 1  . Multiple Vitamins-Minerals (MULTIVITAMIN WITH MINERALS) tablet Take 1 tablet by mouth daily.    . Omega-3 Fatty Acids (FISH OIL) 1200 MG CAPS Take 2 capsules by mouth daily.    Marland Kitchen pyridostigmine (MESTINON) 60 MG tablet Take 60 mg by mouth 4 (four) times daily. 3 TO 4 TIMES DAILY  4  . rosuvastatin (CRESTOR) 5 MG tablet Take 1 tablet (5 mg total) by mouth daily. 90 tablet 1  . telmisartan-hydrochlorothiazide (MICARDIS HCT) 40-12.5 MG tablet TAKE 1 TABLET BY MOUTH DAILY. 30 tablet 1  . Travoprost, BAK Free, (TRAVATAN Z) 0.004 % SOLN ophthalmic solution Place 1 drop into the left eye at bedtime.     . VENTOLIN HFA 108 (90 Base) MCG/ACT inhaler INHALE 2 PUFFS BY MOUTH EVERY 4 HOURS AS NEEDED SHORTNESS OF BREATH/WHEEZING 18 g 0   No current facility-administered  medications on file prior to visit.     Allergies  Allergen Reactions  . Aspirin     Upset stomach   .  Azithromycin     Contraindicated in Myasthenia   . Ace Inhibitors     cough  . Amlodipine     Edema of feet and hands  . Gabapentin     anxiety  . Pregabalin Other (See Comments)    anxiety    Family History  Problem Relation Age of Onset  . Diabetes Mother   . Hypertension Mother   . Muscular dystrophy Sister   . Breast cancer Neg Hx   . Thyroid disease Neg Hx     BP (!) 142/68   Pulse 76   Wt 235 lb 9.6 oz (106.9 kg)   SpO2 97%   BMI 43.09 kg/m     Review of Systems Denies weight change, hoarseness, visual loss, chest pain, sob, cough, dysphagia, diarrhea, itching, easy bruising, cold intolerance, numbness, and rhinorrhea.  She has intermitt flushing and headache.  Depression is well-controlled.  She has intermitt solid=liquid dysphagia     Objective:   Physical Exam VS: see vs page GEN: no distress HEAD: head: no deformity eyes: no periorbital swelling, no proptosis external nose and ears are normal mouth: no lesion seen NECK: supple, thyroid is not enlarged CHEST WALL: no deformity LUNGS: clear to auscultation CV: reg rate and rhythm, no murmur ABD: abdomen is soft, nontender.  no hepatosplenomegaly.  not distended.  no hernia MUSCULOSKELETAL: muscle bulk and strength are grossly normal.  no obvious joint swelling.  gait is normal and steady EXTEMITIES: no deformity.  no edema PULSES: no carotid bruit NEURO:  cn 2-12 grossly intact.   readily moves all 4's.  sensation is intact to touch on all 4's SKIN:  Normal texture and temperature.  No rash or suspicious lesion is visible.   NODES:  None palpable at the neck PSYCH: alert, well-oriented.  Does not appear anxious nor depressed.    Lab Results  Component Value Date   TSH 0.86 10/17/2017   [place]: Nodules 1 and 2 are stable and continue to meet criteria for fine needle aspiration biopsy.  Nodule 4  is stable and meets criteria for annual follow-up.  Other nodules do not meet criteria for biopsy nor follow-up.  I have reviewed outside records, and summarized: Pt was noted to have nodular goiter, and referred here.  She had had a CT to look for thymoma, and goiter was incidentally noted     Assessment & Plan:  HTN: is noted today Multinodular goiter, new to me  Flushing, uncertain etiology. Dysphagia, mild.  Not thyroid-related.  Patient Instructions  Your blood pressure is high today.  Please see your primary care provider soon, to have it rechecked. Let's check a 24-HR urine collection.  We'll let you know about the results.  Please let your PCP know if you want to have the swallowing checked. most of the time, a "lumpy thyroid" will eventually become overactive.  this is usually a slow process, happening over the span of many years. Please come back for a follow-up appointment in 1 year.

## 2017-12-09 ENCOUNTER — Encounter: Payer: Self-pay | Admitting: Nurse Practitioner

## 2017-12-09 ENCOUNTER — Encounter: Payer: Self-pay | Admitting: Emergency Medicine

## 2017-12-09 ENCOUNTER — Ambulatory Visit (INDEPENDENT_AMBULATORY_CARE_PROVIDER_SITE_OTHER): Payer: 59 | Admitting: Nurse Practitioner

## 2017-12-09 VITALS — BP 126/82 | HR 84 | Temp 98.3°F | Resp 18 | Ht 62.0 in | Wt 235.4 lb

## 2017-12-09 DIAGNOSIS — J0100 Acute maxillary sinusitis, unspecified: Secondary | ICD-10-CM

## 2017-12-09 DIAGNOSIS — R0981 Nasal congestion: Secondary | ICD-10-CM

## 2017-12-09 DIAGNOSIS — E785 Hyperlipidemia, unspecified: Secondary | ICD-10-CM | POA: Insufficient documentation

## 2017-12-09 DIAGNOSIS — R05 Cough: Secondary | ICD-10-CM | POA: Diagnosis not present

## 2017-12-09 DIAGNOSIS — R059 Cough, unspecified: Secondary | ICD-10-CM

## 2017-12-09 MED ORDER — BENZONATATE 100 MG PO CAPS: 100.0000 mg | ORAL_CAPSULE | Freq: Two times a day (BID) | ORAL | 0 refills | Status: DC | PRN

## 2017-12-09 MED ORDER — FLUTICASONE PROPIONATE 50 MCG/ACT NA SUSP: 2.0000 | Freq: Every day | NASAL | 6 refills | Status: DC

## 2017-12-09 MED ORDER — DOXYCYCLINE HYCLATE 100 MG PO TABS: 100.0000 mg | ORAL_TABLET | Freq: Two times a day (BID) | ORAL | 0 refills | Status: DC

## 2017-12-09 NOTE — Patient Instructions (Addendum)
Please take the antibiotic twice day with food until it is all gone, even if you feel better before the medicine is finished.  Please do eat yogurt daily or take a probiotic daily for the next month or two We want to replace the healthy germs in the gut If you notice foul, watery diarrhea in the next two months, schedule an appointment RIGHT AWAY.  Your body is so smart and strong that it will be fighting this illness off for you but it is important that you drink plenty of fluids, rest. Cover your nose/mouth when you cough or sneeze and wash your hands well and often. Here are some helpful things you can use or pick up over the counter from the pharmacy to help with your symptoms:   For Fever/Pain: Acetaminophen every 6 hours as needed (maximum of 3000mg  a day).  For coughing: try dextromethorphan for a cough suppressant, and/or a cool mist humidifier, lozenges  For sore throat: saline gargles, honey herbal tea, lozenges, throat spray  To dry out your nose: try an antihistamine like loratadine (non-sedating) or diphenhydramine (sedating) or others To relieve a stuffy nose:  neti pot To make blowing your nose easier: guaifenesin   Try to use PLAIN allergy medicine without the decongestant Avoid: phenylephrine, phenylpropanolamine, and pseudoephredine

## 2017-12-09 NOTE — Progress Notes (Addendum)
Name: Jasmine Woods   MRN: [id number]    DOB: 10-08-1955   Date:12/09/2017       Progress Note  Subjective  Chief Complaint  Chief Complaint  Patient presents with  . Sinusitis    cough, congested, sore throat for 6 days    HPI  States Thursday felt tired, Friday woke up with a sore throat, cough, nasal congestion. Started feeling better yesterday and then got significantly worse yesterday and today. Endorses headache.   Denies shob, cp, fevers, chills, nausea.   Tried tylenol cold and sinus with temporary relief.   Patient Active Problem List   Diagnosis Date Noted  . Diaphoresis 11/26/2017  . Special screening for malignant neoplasms, colon   . Epiretinal membrane (ERM) of left eye 09/09/2016  . Glaucoma, left eye 09/05/2016  . Abnormal imaging of thyroid 09/15/2015  . Seronegative myasthenia gravis ([place]) 08/08/2015  . Psoriasis 07/06/2015  . Stress incontinence 07/06/2015  . Chronic tension-type headache, intractable 05/29/2015  . Anxiety and depression 04/12/2015  . Asthma, mild intermittent 04/12/2015  . Bladder polyp 04/12/2015  . Carpal tunnel syndrome 04/12/2015  . Binocular vision disorder with diplopia 04/12/2015  . Dyslipidemia 04/12/2015  . H/O: HTN (hypertension) 04/12/2015  . Hemorrhoids, internal 04/12/2015  . Neuropathy 04/12/2015  . Nuclear sclerotic cataract 04/12/2015  . Morbid obesity ([place]) 04/12/2015  . Obstructive apnea 04/12/2015  . Perennial allergic rhinitis 04/12/2015  . 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 Type 2 diabetes, controlled, with neuropathy ([place]) 04/12/2015  . Vitamin D deficiency 04/12/2015  . Cephalalgia 08/16/2014  . Lump or mass in breast 06/14/2013  . Bariatric surgery status 10/12/2012    Past Medical History:  Diagnosis Date  . Allergy   . Anxiety   . Asthma   . Bladder polyp    Dr. Ernst Spell  . Carpal tunnel syndrome   . Depression   . Diabetes mellitus without complication  ([place])    Diet controlled  . Double vision    seen by Dr. 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 Dyslipidemia   . Frequency of urination   . H/O myasthenia gravis    MOSTLY AFFECTS EYES  . HA (headache)   . Heart murmur    followed by PCP  . Hx of bariatric surgery   . Hypertension    no meds  . Internal hemorrhoids   . Myasthenia gravis ([place])    "mostly effects eyes"  . Neuropathy, generalized    diabetic on feet  . Nuclear sclerotic cataract of both eyes   . Obesity (BMI 30-39.9)   . Obstructive sleep apnea on CPAP    uses cpap  . Patient is Jehovah's Witness    no blood products  . Rotator cuff tendonitis 07/2009   also has impingment right shoulder pain, seen by Dr. Mack Guise  . Sleep apnea   . Thyroid nodule   . Vaginal dryness, menopausal   . Vitamin D deficiency     Past Surgical History:  Procedure Laterality Date  . ABDOMINAL HYSTERECTOMY  1991  . [place] SURGERY  2014  . CATARACT EXTRACTION W/PHACO Left 01/28/2017   Procedure: CATARACT EXTRACTION PHACO AND INTRAOCULAR LENS PLACEMENT (IOC);  Surgeon: Birder Robson, MD;  Location: [facility];  Service: Ophthalmology;  Laterality: Left;  [place] 00:37 AP% 17.8 CDE 6.61 Fluid pack lot # [id number]  . COLONOSCOPY  2012  . COLONOSCOPY WITH PROPOFOL N/A 09/30/2016   Procedure: COLONOSCOPY WITH PROPOFOL;  Surgeon: Lucilla Lame, MD;  Location: [place] [facility];  Service: Endoscopy;  Laterality: N/A;  diabetic - diet controlled sleep apnea  . KNEE SURGERY Right 1979-1980  . PARS PLANA VITRECTOMY Left 03/27/2016   Procedure: PARS PLANA VITRECTOMY WITH 25 GAUGE AND MEMBRANE PEEL, INTERNAL LIMITING MEMBRANE;  Surgeon: Hurman Horn, MD;  Location: [facility];  Service: Ophthalmology;  Laterality: Left;  . THYROID SURGERY     BX done was benign  . UPPER GI ENDOSCOPY    . VITRECTOMY Left 04/27/2016    Social History   Tobacco Use  . Smoking status: Former Smoker    Years: 3.00    Last attempt to quit: 06/25/1975     Years since quitting: 42.4  . Smokeless tobacco: Never Used  . Tobacco comment: smoked as teenager  Substance Use Topics  . Alcohol use: No    Alcohol/week: 0.0 oz     Current Outpatient Medications:  .  acetaminophen (TYLENOL) 500 MG tablet, Take 1,000 mg by mouth every 6 (six) hours as needed for mild pain. Reported on 07/06/2015, Disp: , Rfl:  .  brimonidine (ALPHAGAN) 0.2 % ophthalmic solution, Place 1 drop into the left eye 2 (two) times daily. , Disp: , Rfl: 5 .  budesonide (RHINOCORT AQUA) 32 MCG/ACT nasal spray, Place 2 sprays into both nostrils daily. (Patient taking differently: Place 2 sprays into both nostrils at bedtime. ), Disp: 1 Bottle, Rfl: 5 .  Coenzyme Q10 (CO Q 10) 100 MG CAPS, Take 1 capsule by mouth daily., Disp: 30 capsule, Rfl: 2 .  diclofenac sodium (VOLTAREN) 1 % GEL, Apply 4 g topically 4 (four) times daily., Disp: 100 g, Rfl: 0 .  Dulaglutide (TRULICITY) 1.5 WC/3.7SE SOPN, Inject 1.5 mg into the skin once a week., Disp: 4 pen, Rfl: 2 .  DULoxetine (CYMBALTA) 60 MG capsule, Take 1 capsule (60 mg total) by mouth daily., Disp: 90 capsule, Rfl: 1 .  glucose blood (ACCU-CHEK GUIDE) test strip, Use as instructed, Disp: 100 each, Rfl: 12 .  loratadine (CLARITIN) 10 MG tablet, Take 1 tablet (10 mg total) by mouth 2 (two) times daily., Disp: 60 tablet, Rfl: 0 .  montelukast (SINGULAIR) 10 MG tablet, Take 1 tablet (10 mg total) by mouth at bedtime., Disp: 90 tablet, Rfl: 1 .  Multiple Vitamins-Minerals (MULTIVITAMIN WITH MINERALS) tablet, Take 1 tablet by mouth daily., Disp: , Rfl:  .  Omega-3 Fatty Acids (FISH OIL) 1200 MG CAPS, Take 2 capsules by mouth daily., Disp: , Rfl:  .  pyridostigmine (MESTINON) 60 MG tablet, Take 60 mg by mouth 4 (four) times daily. 3 TO 4 TIMES DAILY, Disp: , Rfl: 4 .  rosuvastatin (CRESTOR) 5 MG tablet, Take 1 tablet (5 mg total) by mouth daily., Disp: 90 tablet, Rfl: 1 .  telmisartan-hydrochlorothiazide (MICARDIS HCT) 40-12.5 MG tablet, TAKE 1  TABLET BY MOUTH DAILY., Disp: 30 tablet, Rfl: 1 .  Travoprost, BAK Free, (TRAVATAN Z) 0.004 % SOLN ophthalmic solution, Place 1 drop into the left eye at bedtime. , Disp: , Rfl:  .  VENTOLIN HFA 108 (90 Base) MCG/ACT inhaler, INHALE 2 PUFFS BY MOUTH EVERY 4 HOURS AS NEEDED SHORTNESS OF BREATH/WHEEZING, Disp: 18 g, Rfl: 0  Allergies  Allergen Reactions  . Aspirin     Upset stomach   . Azithromycin     Contraindicated in Myasthenia   . Ace Inhibitors     cough  . Amlodipine     Edema of feet and hands  . Gabapentin     anxiety  .  Pregabalin Other (See Comments)    anxiety    ROS  No other specific complaints in a complete review of systems (except as listed in HPI above).  Objective  Vitals:   12/09/17 0944  BP: 126/82  Pulse: 84  Resp: 18  Temp: 98.3 F (36.8 C)  TempSrc: Oral  SpO2: 96%  Weight: 235 lb 6.4 oz (106.8 kg)  Height: 5\' 2"  (1.575 m)    Body mass index is 43.06 kg/m.  Nursing Note and Vital Signs reviewed.  Physical Exam   Constitutional: Patient appears well-developed and well-nourished. Obese No distress.  HEENT: head atraumatic, normocephalic, pupils equal and reactive to light, TM's without erythema or bulging,  Positive right maxillary sinus tenderness.no  frontal sinus tenderness , neck supple without lymphadenopathy, oropharynx pink and moist without exudate, clear nasal discharge Cardiovascular: Normal rate, regular rhythm, S1/S2 present.   Pulmonary/Chest: Effort normal and breath sounds clear.  Psychiatric: Patient has a normal mood and affect. behavior is normal. Judgment and thought content normal.  No results found for this or any previous visit (from the past 72 hour(s)).  Assessment & Plan  1. Acute non-recurrent maxillary sinusitis  - fluticasone (FLONASE) 50 MCG/ACT nasal spray; Place 2 sprays into both nostrils daily.  Dispense: 16 g; Refill: 6 - doxycycline (VIBRA-TABS) 100 MG tablet; Take 1 tablet (100 mg total) by mouth 2  (two) times daily.  Dispense: 20 tablet; Refill: 0  2. Nasal congestion  - fluticasone (FLONASE) 50 MCG/ACT nasal spray; Place 2 sprays into both nostrils daily.  Dispense: 16 g; Refill: 6  3. Cough  - benzonatate (TESSALON) 100 MG capsule; Take 1 capsule (100 mg total) by mouth 2 (two) times daily as needed for cough.  Dispense: 20 capsule; Refill: 0   -Red flags and when to present for emergency care or RTC including fever >101.55F, chest pain, shortness of breath, new/worsening/un-resolving symptoms, reviewed with patient at time of visit. Follow up and care instructions discussed and provided in AVS. -Reviewed Health Maintenance: will request records from [facility]   --------------------------------------- I have reviewed this encounter including the documentation in this note and/or discussed this patient with the provider, Suezanne Cheshire DNP AGNP-C. I am certifying that I agree with the content of this note as supervising physician. Enid Derry, [facility] 12/09/2017, 5:12 PM

## 2017-12-12 ENCOUNTER — Telehealth: Payer: Self-pay | Admitting: Family Medicine

## 2017-12-12 NOTE — Telephone Encounter (Signed)
You could bring to another [place] office.  If this is not possible, you could try a [facility] office.

## 2017-12-12 NOTE — Telephone Encounter (Signed)
I LVM for patient based on address to take 24 hour urine to nearest [place], which for patient is [facility] [employer] [facility]. I stated that if she had questions she could call back.

## 2017-12-12 NOTE — Telephone Encounter (Signed)
Patient requests a call today to clarify where and how to bring her 24 hour specimen to, as she states Dr. Loanne Drilling told her she can bring it somewhere outside of [place]. Patient states she does not have her cell with her today, and to please call her at work on [phone]. The patient states that it is okay for [place] to leave a message on this phone.

## 2017-12-15 ENCOUNTER — Other Ambulatory Visit: Payer: 59

## 2017-12-15 DIAGNOSIS — R61 Generalized hyperhidrosis: Secondary | ICD-10-CM | POA: Diagnosis not present

## 2017-12-18 LAB — METANEPHRINES, URINE, 24 HOUR
METANEPHRINES UR: 52 ug/(24.h) — AB (ref 90–315)
Metaneph Total, Ur: 566 mcg/24 h (ref 224–832)
NORMETANEPHRINE 24H UR: 514 ug/(24.h) (ref 122–676)
Volume, Urine-VMAUR: 2050 mL

## 2017-12-18 LAB — CATECHOLAMINES, FRACTIONATED, URINE, 24 HOUR
CREATININE, URINE MG/DAY-CATEUR: 1.24 g/(24.h) (ref 0.50–2.15)
Calculated Total (E+NE): 156 mcg/24 h — ABNORMAL HIGH (ref 26–121)
Dopamine, 24 hr Urine: 295 mcg/24 h (ref 52–480)
Norepinephrine, 24 hr Ur: 156 mcg/24 h — ABNORMAL HIGH (ref 15–100)
Volume, Urine-VMAUR: 2050 mL

## 2018-01-20 ENCOUNTER — Ambulatory Visit: Payer: 59 | Admitting: Family Medicine

## 2018-02-16 ENCOUNTER — Other Ambulatory Visit: Payer: Self-pay | Admitting: Family Medicine

## 2018-02-16 DIAGNOSIS — I10 Essential (primary) hypertension: Secondary | ICD-10-CM

## 2018-02-20 ENCOUNTER — Ambulatory Visit: Payer: 59 | Admitting: Family Medicine

## 2018-02-20 ENCOUNTER — Encounter: Payer: Self-pay | Admitting: Family Medicine

## 2018-02-20 VITALS — BP 128/86 | HR 68 | Temp 98.7°F | Resp 14 | Ht 62.0 in | Wt 229.6 lb

## 2018-02-20 DIAGNOSIS — E1140 Type 2 diabetes mellitus with diabetic neuropathy, unspecified: Secondary | ICD-10-CM

## 2018-02-20 DIAGNOSIS — E559 Vitamin D deficiency, unspecified: Secondary | ICD-10-CM

## 2018-02-20 DIAGNOSIS — E042 Nontoxic multinodular goiter: Secondary | ICD-10-CM

## 2018-02-20 DIAGNOSIS — E785 Hyperlipidemia, unspecified: Secondary | ICD-10-CM

## 2018-02-20 DIAGNOSIS — E1169 Type 2 diabetes mellitus with other specified complication: Secondary | ICD-10-CM | POA: Diagnosis not present

## 2018-02-20 DIAGNOSIS — G629 Polyneuropathy, unspecified: Secondary | ICD-10-CM

## 2018-02-20 DIAGNOSIS — G4733 Obstructive sleep apnea (adult) (pediatric): Secondary | ICD-10-CM

## 2018-02-20 DIAGNOSIS — G7000 Myasthenia gravis without (acute) exacerbation: Secondary | ICD-10-CM | POA: Diagnosis not present

## 2018-02-20 DIAGNOSIS — I10 Essential (primary) hypertension: Secondary | ICD-10-CM

## 2018-02-20 DIAGNOSIS — G44221 Chronic tension-type headache, intractable: Secondary | ICD-10-CM

## 2018-02-20 DIAGNOSIS — Z23 Encounter for immunization: Secondary | ICD-10-CM

## 2018-02-20 DIAGNOSIS — Z9884 Bariatric surgery status: Secondary | ICD-10-CM

## 2018-02-20 DIAGNOSIS — R61 Generalized hyperhidrosis: Secondary | ICD-10-CM

## 2018-02-20 LAB — POCT GLYCOSYLATED HEMOGLOBIN (HGB A1C): HbA1c, POC (controlled diabetic range): 5.5 % (ref 0.0–7.0)

## 2018-02-20 MED ORDER — DULOXETINE HCL 60 MG PO CPEP: 60.0000 mg | ORAL_CAPSULE | Freq: Every day | ORAL | 1 refills | Status: DC

## 2018-02-20 MED ORDER — TOPIRAMATE 100 MG PO TABS: 100.0000 mg | ORAL_TABLET | Freq: Every day | ORAL | 1 refills | Status: DC

## 2018-02-20 MED ORDER — DULAGLUTIDE 1.5 MG/0.5ML ~~LOC~~ SOAJ: 1.5000 mg | SUBCUTANEOUS | 2 refills | Status: DC

## 2018-02-20 MED ORDER — ROSUVASTATIN CALCIUM 5 MG PO TABS: 5.0000 mg | ORAL_TABLET | Freq: Every day | ORAL | 1 refills | Status: DC

## 2018-02-20 NOTE — Progress Notes (Signed)
Name: Jasmine Woods   MRN: [id number]    DOB: May 03, 1956   Date:02/20/2018       Progress Note  Subjective  Chief Complaint  Chief Complaint  Patient presents with  . Follow-up    patient is here for a 3 mth f/u  . Asthma  . Hyperlipidemia  . Obesity  . Sleep Apnea  . Medication Refill  . Diabetes  . Shoulder Pain    HPI  DMII:She was off medication afterbariatric surgery in 2014,but we re-started medication 02/2017 she denies polyphagia, polydipsia or polyuria. She has been more active, walking at least 6000 steps daily. hgbA1C from 5.6% to 5.9% down to 5.7%and up to 5.8%,  5.6% and now 5.5% .  Neuropathy is still 2/10, constant, burning-like on both feet, she is on Cymbalta and seems to improve symptoms, she could not tolerate gabapentin or Lyrica and also did not work.Average glucose for 90 days was 90. On trulicity and is doing well.   Hyperlipidemia: takingCrestor, taking Co-Q 10 andhas noticed improvement ofmuscle aches, no chest pain or SOB. We will recheck labs today .   Asthma: she is doing very well now, no cough, wheezing or SOB . Unchanged   Myasthenia Gravis: diagnosed at [facility] by Dr. Burnett Harry, 09/2015, seronegative, only causing ptosis and double vision, no other symptoms. She had a negative chest CT for thymoma, and has started on medication ( Mestinon ), she states it works quickly but wears out very fast. She takes before driving and for work.Does not usually bother her to have double vision when at home. Unchanged.   Chronic Tension Headaches: seen by Dr. Melrose Nakayama, taking Topamax 100 mg dailyand it was  working well for her, she stopped medication and pain on right side of head and ear returned so she is back on it now.    Abnormal CT thyroid: incidental finding of thyroid calcification of left lobe, she saw Endo and had negative biopsy., she had an US done one year ago, accidentally given levothyroxine by endo and caused TSH suppression,  seen by Dr. Loanne Drilling and he decided to monitor for now, he will see her back in one year.   Bariatric Surgery/Obesity: she had bariatric surgery back in 2014 , her weight was almost 300 lbs, went down to close to 200lbs, butwasgradually gaining weight,she was started on Trulicity 76/1607 and lost 15 lbs . She states medication seems to curb her appetite. She has been walking at least 5000 steps a day. No longer snacking late at night.   OSA: she is wearing CPAP every night, all night, she does not wake up with headaches, and feels good during the day. Unchanged  Shoulder pain: she denies any trauma, but has noticed a dull pain on right anterior shoulder, worse at night and when not moving, not affected by activity. She cannot take oral nsaid's because of history of gastric bypass. She is using topical medication, she did home PT and is doing well now.   Excessive sweating: she had a hysterectomy in her 30's took HRT until her 50's, was doing well, but has noticed for the past 3 months excessive sweating all over her body, not associated with chest pain or SOB or fatigue, but happens with any form of physical activity never at rest or while sleeping. She states walking the dog, folding clothes, washing dishes, anything that requires movement. She has DM, obesity and HTN. Discussed importance of ruling out cardiac causes and she agrees on going to see  cardiologist   Patient Active Problem List   Diagnosis Date Noted  . Hyperlipidemia 12/09/2017  . Diaphoresis 11/26/2017  . Special screening for malignant neoplasms, colon   . Epiretinal membrane (ERM) of left eye 09/09/2016  . Glaucoma, left eye 09/05/2016  . Abnormal imaging of thyroid 09/15/2015  . Seronegative myasthenia gravis ([place]) 08/08/2015  . Psoriasis 07/06/2015  . Stress incontinence 07/06/2015  . Chronic tension-type headache, intractable 05/29/2015  . Anxiety and depression 04/12/2015  . Asthma, mild intermittent 04/12/2015  .  Bladder polyp 04/12/2015  . Carpal tunnel syndrome 04/12/2015  . Binocular vision disorder with diplopia 04/12/2015  . Dyslipidemia 04/12/2015  . H/O: HTN (hypertension) 04/12/2015  . Hemorrhoids, internal 04/12/2015  . Neuropathy 04/12/2015  . Nuclear sclerotic cataract 04/12/2015  . Morbid obesity ([place]) 04/12/2015  . Obstructive apnea 04/12/2015  . Perennial allergic rhinitis 04/12/2015  . 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 Type 2 diabetes, controlled, with neuropathy ([place]) 04/12/2015  . Vitamin D deficiency 04/12/2015  . Cephalalgia 08/16/2014  . Lump or mass in breast 06/14/2013  . Bariatric surgery status 10/12/2012    Past Surgical History:  Procedure Laterality Date  . ABDOMINAL HYSTERECTOMY  1991  . [place] SURGERY  2014  . CATARACT EXTRACTION W/PHACO Left 01/28/2017   Procedure: CATARACT EXTRACTION PHACO AND INTRAOCULAR LENS PLACEMENT (IOC);  Surgeon: Birder Robson, MD;  Location: [facility];  Service: Ophthalmology;  Laterality: Left;  [place] 00:37 AP% 17.8 CDE 6.61 Fluid pack lot # [id number]  . COLONOSCOPY  2012  . COLONOSCOPY WITH PROPOFOL N/A 09/30/2016   Procedure: COLONOSCOPY WITH PROPOFOL;  Surgeon: Lucilla Lame, MD;  Location: [facility];  Service: Endoscopy;  Laterality: N/A;  diabetic - diet controlled sleep apnea  . KNEE SURGERY Right 1979-1980  . PARS PLANA VITRECTOMY Left 03/27/2016   Procedure: PARS PLANA VITRECTOMY WITH 25 GAUGE AND MEMBRANE PEEL, INTERNAL LIMITING MEMBRANE;  Surgeon: Hurman Horn, MD;  Location: [facility];  Service: Ophthalmology;  Laterality: Left;  . THYROID SURGERY     BX done was benign  . UPPER GI ENDOSCOPY    . VITRECTOMY Left 04/27/2016    Family History  Problem Relation Age of Onset  . Diabetes Mother   . Hypertension Mother   . Muscular dystrophy Sister   . Breast cancer Neg Hx   . Thyroid disease Neg Hx     Social History   Socioeconomic History  . Marital status: Married    Spouse  name: Gwyndolyn Saxon "Butch"  . Number of children: 2  . Years of education: Not on file  . Highest education level: Associate degree: occupational, Hotel manager, or vocational program  Occupational History  . Not on file  Social Needs  . Financial resource strain: Not hard at all  . Food insecurity:    Worry: Never true    Inability: Never true  . Transportation needs:    Medical: No    Non-medical: No  Tobacco Use  . Smoking status: Former Smoker    Years: 3.00    Last attempt to quit: 06/25/1975    Years since quitting: 42.6  . Smokeless tobacco: Never Used  . Tobacco comment: smoked as teenager  Substance and Sexual Activity  . Alcohol use: No    Alcohol/week: 0.0 standard drinks  . Drug use: No  . Sexual activity: Not Currently  Lifestyle  . Physical activity:    Days per week: 7 days    Minutes per session: 30 min  .  Stress: Not at all  Relationships  . Social connections:    Talks on phone: More than three times a week    Gets together: More than three times a week    Attends religious service: More than 4 times per year    Active member of club or organization: Yes    Attends meetings of clubs or organizations: 1 to 4 times per year    Relationship status: Married  . Intimate partner violence:    Fear of current or ex partner: No    Emotionally abused: No    Physically abused: No    Forced sexual activity: Not on file  Other Topics Concern  . Not on file  Social History Narrative  . Not on file     Current Outpatient Medications:  .  acetaminophen (TYLENOL) 500 MG tablet, Take 1,000 mg by mouth every 6 (six) hours as needed for mild pain. Reported on 07/06/2015, Disp: , Rfl:  .  brimonidine (ALPHAGAN) 0.2 % ophthalmic solution, Place 1 drop into the left eye 2 (two) times daily. , Disp: , Rfl: 5 .  budesonide (RHINOCORT AQUA) 32 MCG/ACT nasal spray, Place 2 sprays into both nostrils daily. (Patient taking differently: Place 2 sprays into both nostrils at bedtime. ),  Disp: 1 Bottle, Rfl: 5 .  Coenzyme Q10 (CO Q 10) 100 MG CAPS, Take 1 capsule by mouth daily., Disp: 30 capsule, Rfl: 2 .  diclofenac sodium (VOLTAREN) 1 % GEL, Apply 4 g topically 4 (four) times daily., Disp: 100 g, Rfl: 0 .  Dulaglutide (TRULICITY) 1.5 XV/4.0GQ SOPN, Inject 1.5 mg into the skin once a week., Disp: 4 pen, Rfl: 2 .  DULoxetine (CYMBALTA) 60 MG capsule, Take 1 capsule (60 mg total) by mouth daily., Disp: 90 capsule, Rfl: 1 .  glucose blood (ACCU-CHEK GUIDE) test strip, Use as instructed, Disp: 100 each, Rfl: 12 .  loratadine (CLARITIN) 10 MG tablet, Take 1 tablet (10 mg total) by mouth 2 (two) times daily., Disp: 60 tablet, Rfl: 0 .  montelukast (SINGULAIR) 10 MG tablet, Take 1 tablet (10 mg total) by mouth at bedtime., Disp: 90 tablet, Rfl: 1 .  Multiple Vitamins-Minerals (MULTIVITAMIN WITH MINERALS) tablet, Take 1 tablet by mouth daily., Disp: , Rfl:  .  Omega-3 Fatty Acids (FISH OIL) 1200 MG CAPS, Take 2 capsules by mouth daily., Disp: , Rfl:  .  pyridostigmine (MESTINON) 60 MG tablet, Take 60 mg by mouth 4 (four) times daily. 3 TO 4 TIMES DAILY, Disp: , Rfl: 4 .  rosuvastatin (CRESTOR) 5 MG tablet, Take 1 tablet (5 mg total) by mouth daily., Disp: 90 tablet, Rfl: 1 .  telmisartan-hydrochlorothiazide (MICARDIS HCT) 40-12.5 MG tablet, TAKE 1 TABLET BY MOUTH DAILY., Disp: 30 tablet, Rfl: 0 .  Travoprost, BAK Free, (TRAVATAN Z) 0.004 % SOLN ophthalmic solution, Place 1 drop into the left eye at bedtime. , Disp: , Rfl:  .  VENTOLIN HFA 108 (90 Base) MCG/ACT inhaler, INHALE 2 PUFFS BY MOUTH EVERY 4 HOURS AS NEEDED SHORTNESS OF BREATH/WHEEZING, Disp: 18 g, Rfl: 0 .  fluticasone (FLONASE) 50 MCG/ACT nasal spray, Place 2 sprays into both nostrils daily. (Patient not taking: Reported on 02/20/2018), Disp: 16 g, Rfl: 6  Allergies  Allergen Reactions  . Aspirin     Upset stomach   . Azithromycin     Contraindicated in Myasthenia   . Ace Inhibitors     cough  . Amlodipine     Edema of  feet and hands  .  Gabapentin     anxiety  . Pregabalin Other (See Comments)    anxiety     ROS  Constitutional: Negative for fever , positive for mild weight change.  Respiratory: Negative for cough and shortness of breath.   Cardiovascular: Negative for chest pain or palpitations.  Gastrointestinal: Negative for abdominal pain, no bowel changes.  Musculoskeletal: Negative for gait problem or joint swelling.  Skin: Negative for rash.  Neurological: Negative for dizziness or headache.  No other specific complaints in a complete review of systems (except as listed in HPI above).  Objective  Vitals:   02/20/18 1110  BP: 128/86  Pulse: 68  Resp: 14  Temp: 98.7 F (37.1 C)  TempSrc: Oral  SpO2: 99%  Weight: 229 lb 9.6 oz (104.1 kg)  Height: 5\' 2"  (1.575 m)    Body mass index is 41.99 kg/m.  Physical Exam  Constitutional: Patient appears well-developed and well-nourished. Obese  No distress.  HEENT: head atraumatic, normocephalic, pupils equal and reactive to light, neck supple, throat within normal limits Cardiovascular: Normal rate, regular rhythm and normal heart sounds.  No murmur heard. No BLE edema. Pulmonary/Chest: Effort normal and breath sounds normal. No respiratory distress. Abdominal: Soft.  There is no tenderness. Psychiatric: Patient has a normal mood and affect. behavior is normal. Judgment and thought content normal.   Recent Results (from the past 2160 hour(s))  Metanephrines, urine, 24 hour     Status: Abnormal   Collection Time: 12/15/17  8:27 AM  Result Value Ref Range   Volume, Urine-VMAUR 2,050 mL   Metanephrines, Ur 52 (L) 90 - 315 mcg/24 h    Comment: . This test was developed and its analytical performance characteristics have been determined by [place], [place]. It has not been cleared or approved by the U.S. Food and Drug Administration. This assay has been validated pursuant to the CLIA regulations and  is used for clinical purposes. .    Normetanephrine, 24H Ur 514 122 - 676 mcg/24 h    Comment: . This test was developed and its analytical performance characteristics have been determined by [place], [place]. It has not been cleared or approved by the U.S. Food and Drug Administration. This assay has been validated pursuant to the CLIA regulations and is used for clinical purposes. Cathey Endow Total, Ur 566 224 - 832 mcg/24 h    Comment: . A four fold elevation of urinary normetanephrines is extremely likely to be due to a tumor, while a four fold elevation of urinary metanephrines is highly suggestive, but not diagnostic of the tumor. Measurement of plasma Metanephrines and Chromogranin A is recommended for confirmation. .   Catecholamines, fractionated, urine, 24 hour     Status: Abnormal   Collection Time: 12/15/17  8:27 AM  Result Value Ref Range   Volume, Urine-VMAUR 2,050 mL   Epinephrine, 24 hr Urine see note     Comment: Results are below the reportable range for this analyte, which is 2.0 mcg/L. Marland Kitchen This test was developed and its analytical performance characteristics have been determined by [place], [place]. It has not been cleared or approved by the U.S. Food and Drug Administration. This assay has been validated pursuant to the CLIA regulations and is used for clinical purposes. .    Norepinephrine, 24 hr Ur 156 (H) 15 - 100 mcg/24 h    Comment: . This test was developed and its analytical performance  characteristics have been determined by [place], [place]. It has not been cleared or approved by the U.S. Food and Drug Administration. This assay has been validated pursuant to the CLIA regulations and is used for clinical purposes. .    Calculated Total (E+NE) 156 (H) 26 - 121 mcg/24 h    Comment: . This test was developed and its analytical  performance characteristics have been determined by [place], [place]. It has not been cleared or approved by the U.S. Food and Drug Administration. This assay has been validated pursuant to the CLIA regulations and is used for clinical purposes. .    Dopamine, 24 hr Urine 295 52 - 480 mcg/24 h    Comment: . This test was developed and its analytical performance characteristics have been determined by [place], [place]. It has not been cleared or approved by the U.S. Food and Drug Administration. This assay has been validated pursuant to the CLIA regulations and is used for clinical purposes. .    Creatinine, Urine mg/day-CATEUR 1.24 0.50 - 2.15 g/24 h  POCT glycosylated hemoglobin (Hb A1C)     Status: Normal   Collection Time: 02/20/18 11:28 AM  Result Value Ref Range   Hemoglobin A1C     HbA1c POC (<> result, manual entry)     HbA1c, POC (prediabetic range)     HbA1c, POC (controlled diabetic range) 5.5 0.0 - 7.0 %      PHQ2/9: Depression screen [facility] 2/9 02/20/2018 10/17/2017 06/11/2017 03/11/2017 09/05/2016  Decreased Interest 0 0 0 0 0  Down, Depressed, Hopeless 0 0 0 0 0  PHQ - 2 Score 0 0 0 0 0  Altered sleeping 0 - - - -  Tired, decreased energy 0 - - - -  Change in appetite 0 - - - -  Feeling bad or failure about yourself  0 - - - -  Trouble concentrating 0 - - - -  Moving slowly or fidgety/restless 0 - - - -  Suicidal thoughts 0 - - - -  PHQ-9 Score 0 - - - -    Fall Risk: Fall Risk  02/20/2018 10/17/2017 06/11/2017 03/11/2017 09/05/2016  Falls in the past year? No No Yes No No  Number falls in past yr: - - 2 or more - -  Injury with Fall? - - No - -    Functional Status Survey: Is the patient deaf or have difficulty hearing?: No Does the patient have difficulty seeing, even when wearing glasses/contacts?: Yes Does the patient have difficulty concentrating, remembering, or making decisions?:  No Does the patient have difficulty walking or climbing stairs?: No Does the patient have difficulty dressing or bathing?: No Does the patient have difficulty doing errands alone such as visiting a doctor's office or shopping?: No   Assessment & Plan  1. Type 2 diabetes, controlled, with neuropathy (HCC)  - POCT glycosylated hemoglobin (Hb A1C) - Dulaglutide (TRULICITY) 1.5 PN/3.6RW SOPN; Inject 1.5 mg into the skin once a week.  Dispense: 4 pen; Refill: 2 - DULoxetine (CYMBALTA) 60 MG capsule; Take 1 capsule (60 mg total) by mouth daily.  Dispense: 90 capsule; Refill: 1  2. Multiple thyroid nodules  - TSH  3. Peripheral polyneuropathy  - DULoxetine (CYMBALTA) 60 MG capsule; Take 1 capsule (60 mg total) by mouth daily.  Dispense: 90 capsule; Refill: 1  4. Bariatric surgery status  Losing weight again, doing well   5. Seronegative myasthenia gravis (  [place])  Stable   6. Vitamin D deficiency   7. Obstructive apnea   8. Dyslipidemia associated with type 2 diabetes mellitus (HCC)  - Lipid panel  9. Hypertension, benign  Back to normal  - COMPLETE METABOLIC PANEL WITH GFR  10. Dyslipidemia  - rosuvastatin (CRESTOR) 5 MG tablet; Take 1 tablet (5 mg total) by mouth daily.  Dispense: 90 tablet; Refill: 1  11. Needs flu shot  She will get it at work   12. Sweating profusely  Started in may, she has DM, hypertension, symptoms only when active, never at rest or when sleeping, it could be atypical presentation of heart disease.  - Ambulatory referral to Cardiology  13. Chronic tension-type headache, intractable  - topiramate (TOPAMAX) 100 MG tablet; Take 1 tablet (100 mg total) by mouth at bedtime.  Dispense: 90 tablet; Refill: 1

## 2018-03-05 ENCOUNTER — Other Ambulatory Visit: Payer: Self-pay | Admitting: Family Medicine

## 2018-03-05 DIAGNOSIS — E785 Hyperlipidemia, unspecified: Secondary | ICD-10-CM | POA: Diagnosis not present

## 2018-03-05 DIAGNOSIS — I10 Essential (primary) hypertension: Secondary | ICD-10-CM | POA: Diagnosis not present

## 2018-03-05 DIAGNOSIS — E042 Nontoxic multinodular goiter: Secondary | ICD-10-CM | POA: Diagnosis not present

## 2018-03-05 DIAGNOSIS — E1169 Type 2 diabetes mellitus with other specified complication: Secondary | ICD-10-CM | POA: Diagnosis not present

## 2018-03-06 LAB — COMPREHENSIVE METABOLIC PANEL
A/G RATIO: 1.5 (ref 1.2–2.2)
ALK PHOS: 47 IU/L (ref 39–117)
ALT: 13 IU/L (ref 0–32)
AST: 20 IU/L (ref 0–40)
Albumin: 4.2 g/dL (ref 3.6–4.8)
BILIRUBIN TOTAL: 0.3 mg/dL (ref 0.0–1.2)
BUN/Creatinine Ratio: 23 (ref 12–28)
BUN: 15 mg/dL (ref 8–27)
CHLORIDE: 100 mmol/L (ref 96–106)
CO2: 19 mmol/L — ABNORMAL LOW (ref 20–29)
Calcium: 9.3 mg/dL (ref 8.7–10.3)
Creatinine, Ser: 0.65 mg/dL (ref 0.57–1.00)
GFR calc Af Amer: 110 mL/min/{1.73_m2} (ref >59)
GFR calc non Af Amer: 95 mL/min/{1.73_m2} (ref >59)
Globulin, Total: 2.8 g/dL (ref 1.5–4.5)
Glucose: 78 mg/dL (ref 65–99)
POTASSIUM: 4.3 mmol/L (ref 3.5–5.2)
SODIUM: 142 mmol/L (ref 134–144)
Total Protein: 7 g/dL (ref 6.0–8.5)

## 2018-03-06 LAB — LIPID PANEL W/O CHOL/HDL RATIO
CHOLESTEROL TOTAL: 175 mg/dL (ref 100–199)
HDL: 61 mg/dL (ref >39)
LDL CALC: 98 mg/dL (ref 0–99)
TRIGLYCERIDES: 80 mg/dL (ref 0–149)
VLDL Cholesterol Cal: 16 mg/dL (ref 5–40)

## 2018-03-06 LAB — TSH: TSH: 0.826 u[IU]/mL (ref 0.450–4.500)

## 2018-03-18 DIAGNOSIS — H401122 Primary open-angle glaucoma, left eye, moderate stage: Secondary | ICD-10-CM | POA: Diagnosis not present

## 2018-03-23 ENCOUNTER — Other Ambulatory Visit: Payer: Self-pay | Admitting: Family Medicine

## 2018-03-23 DIAGNOSIS — I10 Essential (primary) hypertension: Secondary | ICD-10-CM

## 2018-03-25 DIAGNOSIS — G7000 Myasthenia gravis without (acute) exacerbation: Secondary | ICD-10-CM | POA: Diagnosis not present

## 2018-03-25 DIAGNOSIS — R2689 Other abnormalities of gait and mobility: Secondary | ICD-10-CM | POA: Diagnosis not present

## 2018-04-06 ENCOUNTER — Other Ambulatory Visit: Payer: Self-pay | Admitting: Family Medicine

## 2018-04-15 ENCOUNTER — Encounter: Payer: Self-pay | Admitting: Internal Medicine

## 2018-04-15 ENCOUNTER — Ambulatory Visit: Payer: 59 | Admitting: Internal Medicine

## 2018-04-15 ENCOUNTER — Encounter

## 2018-04-15 VITALS — BP 118/80 | HR 61 | Ht 62.0 in | Wt 230.2 lb

## 2018-04-15 DIAGNOSIS — R61 Generalized hyperhidrosis: Secondary | ICD-10-CM | POA: Diagnosis not present

## 2018-04-15 DIAGNOSIS — R0609 Other forms of dyspnea: Secondary | ICD-10-CM | POA: Diagnosis not present

## 2018-04-15 NOTE — Patient Instructions (Addendum)
Medication Instructions:  Your physician recommends that you continue on your current medications as directed. Please refer to the Current Medication list given to you today.  If you need a refill on your cardiac medications before your next appointment, please call your pharmacy.   Lab work: none If you have labs (blood work) drawn today and your tests are completely normal, you will receive your results only by: Marland Kitchen MyChart Message (if you have MyChart) OR . A paper copy in the mail If you have any lab test that is abnormal or we need to change your treatment, we will call you to review the results.  Testing/Procedures: Your physician has requested that you have an exercise tolerance test. For further information please visit [email or web address]. Please also follow instruction sheet, as given.   DO NOT drink or eat foods with caffeine for 24 hours before the test. (Chocolate, coffee, tea, decaf coffee/tea, or energy drinks)  DO NOT smoke for 4 hours before your test.  If you use an inhaler, bring it with you to the test.  Wear comfortable shoes and clothing. Women do not wear dresses.   Follow-Up: At [facility], you and your health needs are our priority.  As part of our continuing mission to provide you with exceptional heart care, we have created designated Provider Care Teams.  These Care Teams include your primary Cardiologist (physician) and Advanced Practice Providers (APPs -  Physician Assistants and Nurse Practitioners) who all work together to provide you with the care you need, when you need it. You will need a follow up appointment in 3 months.  You may see DR Harrell Gave END or one of the following Advanced Practice Providers on your designated Care Team:   Murray Hodgkins, NP Christell Faith, PA-C . Marrianne Mood, PA-C     Exercise Stress Electrocardiogram An exercise stress electrocardiogram is a test to check how blood flows to your heart. It is done to find  areas of poor blood flow. You will need to walk on a treadmill for this test. The electrocardiogram will record your heartbeat when you are at rest and when you are exercising. What happens before the procedure?  Do not have drinks with caffeine or foods with caffeine for 24 hours before the test, or as told by your doctor. This includes coffee, tea (even decaf tea), sodas, chocolate, and cocoa.  Follow your doctor's instructions about eating and drinking before the test.  Ask your doctor what medicines you should or should not take before the test. Take your medicines with water unless told by your doctor not to.  If you use an inhaler, bring it with you to the test.  Bring a snack to eat after the test.  Do not  smoke for 4 hours before the test.  Do not put lotions, powders, creams, or oils on your chest before the test.  Wear comfortable shoes and clothing. What happens during the procedure?  You will have patches put on your chest. Small areas of your chest may need to be shaved. Wires will be connected to the patches.  Your heart rate will be watched while you are resting and while you are exercising.  You will walk on the treadmill. The treadmill will slowly get faster to raise your heart rate.  The test will take about 1-2 hours. What happens after the procedure?  Your heart rate and blood pressure will be watched after the test.  You may return to your normal diet, activities, and  medicines or as told by your doctor. This information is not intended to replace advice given to you by your health care provider. Make sure you discuss any questions you have with your health care provider. Document Released: 11/27/2007 Document Revised: 02/07/2016 Document Reviewed: 02/15/2013 Elsevier Interactive Patient Education  [employer].

## 2018-04-15 NOTE — Progress Notes (Signed)
New Outpatient Visit Date: 04/15/2018  Referring Provider: Steele Sizer, [address] [place], [place] [address]  Chief Complaint: Sweating  HPI:  Jasmine Woods is a 62 y.o. female who is being seen today for the evaluation of excessive sweating at the request of [name]. She has a history of hypertension, hyperlipidemia, morbid obesity s/p bariatric surgery, myasthenia gravis, and obstructive sleep apnea. For the last ~6 months, Jasmine Woods has noticed significant diaphoresis with minimal activity, such as doing chores around the house.  She does not have diaphoresis when at rest.  She reports chronic dyspnea on exertion, which has been unchanged for years.  She is able to walk about 1/4 mile before needing to stop.  She does not have any symptoms associated with her sweating, including chest pain, palpitations, lightheadedness, and dyspnea.  She notes occasional dependent edema, particularly when sitting for extended periods.  Workup by her PCP, [name], has been unrevealing thus far.  Jasmine Woods reports having undergone an echo ~15 years ago for evaluation of swelling.  She believes this was normal.  She does not exercise regularly but walks her dogs on a daily basis.  --------------------------------------------------------------------------------------------------  Cardiovascular History & Procedures: Cardiovascular Problems:  Excessive diaphoresis (? Anginal equivalent)  Risk Factors:  Hypertension, hyperlipidemia, diabetes mellitus, morbid obesity, and sedentary lifestyle  Cath/PCI:  None  CV Surgery:  None  EP Procedures and Devices:  None  Non-Invasive Evaluation(s):  CTA chest (02/03/17 - personally reviewed): No significant coronary artery or aortic calcification.  No PE.  Lungs clear.  Echo (~15 years ago): No report available, though patient states that the study was normal.  Recent CV Pertinent Labs: Lab Results  Component Value  Date   CHOL 175 03/05/2018   CHOL 153 05/08/2012   HDL 61 03/05/2018   HDL 46 05/08/2012   LDLCALC 98 03/05/2018   LDLCALC 149 (H) 10/17/2017   LDLCALC 77 05/08/2012   TRIG 80 03/05/2018   TRIG 152 05/08/2012   CHOLHDL 3.7 10/17/2017   INR 0.9 05/18/2012   K 4.3 03/05/2018   K 4.4 10/17/2014   MG 2.1 05/19/2013   BUN 15 03/05/2018   BUN 21 (H) 10/17/2014   CREATININE 0.65 03/05/2018   CREATININE 0.62 10/17/2017    --------------------------------------------------------------------------------------------------  Past Medical History:  Diagnosis Date  . Allergy   . Anxiety   . Asthma   . Bladder polyp    Dr. Ernst Spell  . Carpal tunnel syndrome   . Depression   . Diabetes mellitus without complication ([place])    Diet controlled  . Double vision    seen by Dr. 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 Dyslipidemia   . Frequency of urination   . Glaucoma, left eye   . H/O myasthenia gravis    MOSTLY AFFECTS EYES  . HA (headache)   . Heart murmur    followed by PCP  . Hx of bariatric surgery   . Hypertension    no meds  . Internal hemorrhoids   . Myasthenia gravis ([place])    "mostly effects eyes"  . Neuropathy, generalized    diabetic on feet  . Nuclear sclerotic cataract of both eyes   . Obesity (BMI 30-39.9)   . Obstructive sleep apnea on CPAP    uses cpap  . Patient is Jehovah's Witness    no blood products  . Rotator cuff tendonitis 07/2009   also has impingment right shoulder pain, seen by Dr. Mack Guise  .  Sleep apnea   . Thyroid nodule   . Vaginal dryness, menopausal   . Vitamin D deficiency     Past Surgical History:  Procedure Laterality Date  . ABDOMINAL HYSTERECTOMY  1991  . [place] SURGERY  2014  . CATARACT EXTRACTION W/PHACO Left 01/28/2017   Procedure: CATARACT EXTRACTION PHACO AND INTRAOCULAR LENS PLACEMENT (IOC);  Surgeon: Birder Robson, MD;  Location: [facility];  Service: Ophthalmology;  Laterality: Left;  [place] 00:37 AP% 17.8 CDE  6.61 Fluid pack lot # [id number]  . COLONOSCOPY  2012  . COLONOSCOPY WITH PROPOFOL N/A 09/30/2016   Procedure: COLONOSCOPY WITH PROPOFOL;  Surgeon: Lucilla Lame, MD;  Location: [facility];  Service: Endoscopy;  Laterality: N/A;  diabetic - diet controlled sleep apnea  . KNEE SURGERY Right 1979-1980  . PARS PLANA VITRECTOMY Left 03/27/2016   Procedure: PARS PLANA VITRECTOMY WITH 25 GAUGE AND MEMBRANE PEEL, INTERNAL LIMITING MEMBRANE;  Surgeon: Hurman Horn, MD;  Location: [facility];  Service: Ophthalmology;  Laterality: Left;  . THYROID SURGERY     BX done was benign  . UPPER GI ENDOSCOPY    . VITRECTOMY Left 04/27/2016    Current Meds  Medication Sig  . acetaminophen (TYLENOL) 500 MG tablet Take 1,000 mg by mouth every 6 (six) hours as needed for mild pain. Reported on 07/06/2015  . brimonidine (ALPHAGAN) 0.2 % ophthalmic solution Place 1 drop into the left eye 2 (two) times daily.   . budesonide (RHINOCORT AQUA) 32 MCG/ACT nasal spray Place 2 sprays into both nostrils daily. (Patient taking differently: Place 2 sprays into both nostrils at bedtime. )  . Coenzyme Q10 (CO Q 10) 100 MG CAPS Take 1 capsule by mouth daily.  . diclofenac sodium (VOLTAREN) 1 % GEL Apply 4 g topically 4 (four) times daily.  . Dulaglutide (TRULICITY) 1.5 VO/5.3GU SOPN Inject 1.5 mg into the skin once a week.  . DULoxetine (CYMBALTA) 60 MG capsule Take 1 capsule (60 mg total) by mouth daily.  . fluticasone (FLONASE) 50 MCG/ACT nasal spray Place 2 sprays into both nostrils daily.  Marland Kitchen glucose blood (ACCU-CHEK GUIDE) test strip Use as instructed  . loratadine (CLARITIN) 10 MG tablet Take 1 tablet (10 mg total) by mouth 2 (two) times daily.  . montelukast (SINGULAIR) 10 MG tablet Take 1 tablet (10 mg total) by mouth at bedtime.  . Multiple Vitamins-Minerals (MULTIVITAMIN WITH MINERALS) tablet Take 1 tablet by mouth daily.  . Omega-3 Fatty Acids (FISH OIL) 1200 MG CAPS Take 2 capsules by mouth daily.  Marland Kitchen pyridostigmine  (MESTINON) 60 MG tablet Take 60 mg by mouth 4 (four) times daily. 3 TO 4 TIMES DAILY  . rosuvastatin (CRESTOR) 5 MG tablet Take 1 tablet (5 mg total) by mouth daily.  Marland Kitchen telmisartan-hydrochlorothiazide (MICARDIS HCT) 40-12.5 MG tablet TAKE 1 TABLET BY MOUTH DAILY  . topiramate (TOPAMAX) 100 MG tablet Take 1 tablet (100 mg total) by mouth at bedtime.  . Travoprost, BAK Free, (TRAVATAN Z) 0.004 % SOLN ophthalmic solution Place 1 drop into the left eye at bedtime.   . VENTOLIN HFA 108 (90 Base) MCG/ACT inhaler INHALE 2 PUFFS BY MOUTH EVERY 4 HOURS AS NEEDED SHORTNESS OF BREATH/WHEEZING    Allergies: Aspirin; Azithromycin; Ace inhibitors; Amlodipine; Gabapentin; and Pregabalin  Social History   Tobacco Use  . Smoking status: Former Smoker    Years: 3.00    Last attempt to quit: 06/25/1975    Years since quitting: 42.8  . Smokeless tobacco: Never Used  . Tobacco  comment: smoked as teenager  Substance Use Topics  . Alcohol use: No    Alcohol/week: 0.0 standard drinks  . Drug use: No    Family History  Problem Relation Age of Onset  . Diabetes Mother   . Hypertension Mother   . Heart disease Mother 92       stent x 1   . Muscular dystrophy Sister   . Breast cancer Neg Hx   . Thyroid disease Neg Hx     Review of Systems: Patient reports numbness in both hands, attributed to carpal tunnel syndrome.  She is wearing wrist braces at night.  Otherwise, a 12-system review of systems was performed and was negative except as noted in the HPI.  --------------------------------------------------------------------------------------------------  Physical Exam: BP 118/80 (BP Location: Right Arm, Patient Position: Sitting, Cuff Size: Large)   Pulse 61   Ht 5\' 2"  (1.575 m)   Wt 230 lb 4 oz (104.4 kg)   BMI 42.11 kg/m   General:  NAD. HEENT: No conjunctival pallor or scleral icterus. Moist mucous membranes. OP clear. Neck: Supple without lymphadenopathy, thyromegaly, JVD, or HJR. No carotid  bruit. Lungs: Normal work of breathing. Clear to auscultation bilaterally without wheezes or crackles. Heart: Regular rate and rhythm without murmurs, rubs, or gallops. Unable to assess PMI due to body habitus. Abd: Bowel sounds present. Soft, NT/ND.  Unable to assess HSM due to body habitus. Ext: No lower extremity edema. Radial, PT, and DP pulses are 2+ bilaterally Skin: Warm and dry without rash. Neuro: CNIII-XII intact. Strength and fine-touch sensation intact in upper and lower extremities bilaterally. Psych: Normal mood and affect.  EKG:  NSR without abnormality.  Lab Results  Component Value Date   WBC 9.2 10/17/2017   HGB 14.2 10/17/2017   HCT 39.7 10/17/2017   MCV 83.1 10/17/2017   PLT 311 10/17/2017    Lab Results  Component Value Date   NA 142 03/05/2018   K 4.3 03/05/2018   CL 100 03/05/2018   CO2 19 (L) 03/05/2018   BUN 15 03/05/2018   CREATININE 0.65 03/05/2018   GLUCOSE 78 03/05/2018   ALT 13 03/05/2018    Lab Results  Component Value Date   CHOL 175 03/05/2018   HDL 61 03/05/2018   LDLCALC 98 03/05/2018   TRIG 80 03/05/2018   CHOLHDL 3.7 10/17/2017     --------------------------------------------------------------------------------------------------  ASSESSMENT AND PLAN: Diaphoresis and dyspnea on exertion Diaphoresis is non-specific.  While it could represent coronary insufficiency, Jasmine Woods does not report any associated symptoms.  She has chronic shortness of breath that predates her diaphoresis for years.  Cardiac risk factors include hypertension, hyperlipidemia, diabetes mellitus, and morbid obesity.  Exam and EKG today are unremarkable.  We have agreed to obtain an exercise tolerance test to exclude coronary insufficiency.  If this is normal, I think it is fine to defer further workup and focus on exercise and weight loss.  Morbid obesity I encouraged weight loss though diet and exercise.  Jasmine Woods is status post bariatric surgery but  has gained back weight over the last few years.  Follow-up: Return to clinic in 3 months.  Nelva Bush, MD 04/15/2018 10:27 AM

## 2018-05-08 ENCOUNTER — Telehealth: Payer: Self-pay | Admitting: Internal Medicine

## 2018-05-08 NOTE — Telephone Encounter (Signed)
Patient aware of appointment cancelled. We will call when we can schedule

## 2018-05-08 NOTE — Telephone Encounter (Signed)
lmov to reschedule treadmill in office.

## 2018-05-25 ENCOUNTER — Encounter: Payer: Self-pay | Admitting: Family Medicine

## 2018-05-25 ENCOUNTER — Ambulatory Visit: Payer: 59 | Admitting: Family Medicine

## 2018-05-25 VITALS — BP 136/84 | HR 65 | Temp 98.0°F | Resp 16 | Ht 62.0 in | Wt 233.7 lb

## 2018-05-25 DIAGNOSIS — G629 Polyneuropathy, unspecified: Secondary | ICD-10-CM | POA: Diagnosis not present

## 2018-05-25 DIAGNOSIS — J4520 Mild intermittent asthma, uncomplicated: Secondary | ICD-10-CM

## 2018-05-25 DIAGNOSIS — I10 Essential (primary) hypertension: Secondary | ICD-10-CM

## 2018-05-25 DIAGNOSIS — G44221 Chronic tension-type headache, intractable: Secondary | ICD-10-CM

## 2018-05-25 DIAGNOSIS — E1169 Type 2 diabetes mellitus with other specified complication: Secondary | ICD-10-CM

## 2018-05-25 DIAGNOSIS — E559 Vitamin D deficiency, unspecified: Secondary | ICD-10-CM

## 2018-05-25 DIAGNOSIS — G7000 Myasthenia gravis without (acute) exacerbation: Secondary | ICD-10-CM

## 2018-05-25 DIAGNOSIS — E1140 Type 2 diabetes mellitus with diabetic neuropathy, unspecified: Secondary | ICD-10-CM | POA: Diagnosis not present

## 2018-05-25 DIAGNOSIS — E042 Nontoxic multinodular goiter: Secondary | ICD-10-CM

## 2018-05-25 DIAGNOSIS — G4733 Obstructive sleep apnea (adult) (pediatric): Secondary | ICD-10-CM | POA: Diagnosis not present

## 2018-05-25 DIAGNOSIS — Z9884 Bariatric surgery status: Secondary | ICD-10-CM

## 2018-05-25 DIAGNOSIS — J3089 Other allergic rhinitis: Secondary | ICD-10-CM

## 2018-05-25 DIAGNOSIS — E785 Hyperlipidemia, unspecified: Secondary | ICD-10-CM

## 2018-05-25 MED ORDER — DULOXETINE HCL 60 MG PO CPEP: 60.0000 mg | ORAL_CAPSULE | Freq: Every day | ORAL | 1 refills | Status: DC

## 2018-05-25 MED ORDER — TELMISARTAN-HCTZ 40-12.5 MG PO TABS: 1.0000 | ORAL_TABLET | Freq: Every day | ORAL | 1 refills | Status: DC

## 2018-05-25 MED ORDER — DULAGLUTIDE 1.5 MG/0.5ML ~~LOC~~ SOAJ: 1.5000 mg | SUBCUTANEOUS | 2 refills | Status: DC

## 2018-05-25 MED ORDER — ROSUVASTATIN CALCIUM 5 MG PO TABS: 5.0000 mg | ORAL_TABLET | Freq: Every day | ORAL | 1 refills | Status: DC

## 2018-05-25 MED ORDER — MONTELUKAST SODIUM 10 MG PO TABS: 10.0000 mg | ORAL_TABLET | Freq: Every day | ORAL | 1 refills | Status: DC

## 2018-05-25 NOTE — Progress Notes (Signed)
Name: Jasmine Woods   MRN: [id number]    DOB: 02/14/1956   Date:05/25/2018       Progress Note  Subjective  Chief Complaint  Chief Complaint  Patient presents with  . Medication Refill  . Diabetes    Checks every day in the morning, her old meter quit working and has since gotten a new one.  Average past 3 months-80's  Low-62 in September (fasting) Highest- 109 (fasting)  . Asthma    Well controlled  . Hyperlipidemia  . Obesity  . Sleep Apnea  . Myasthenia Gravis    HPI  DMII:She was off medication afterbariatric surgery in 2014,but we re-started medication 41/3244 - Trulicity,  she denies polyphagia, polydipsia or polyuria. She has been more active, walking at least6000 steps daily. hgbA1C from 5.6% to 5.9% down to 5.7%and up to 5.8%, 5.6%, 5.5%, glucose at home has been around 80-90's .Neuropathy is still 2/10, constant, burning-like on both feet, she is on Cymbalta. She could not tolerate gabapentin or Lyrica and also did not work.  Hyperlipidemia: takingCrestor, taking Co-Q 10 andhas noticed improvement ofmuscle aches, no chest pain or SOB. Last lipid panel was a to goal   Asthma:she is doing very well now, no cough, wheezing or SOB. She has a rescue medication at home   Myasthenia Gravis: diagnosed at [facility] by Dr. Burnett Harry, 09/2015, seronegative, only causing ptosis and double vision, no other symptoms. She had a negative chest CT for thymoma, and has started on medication ( Mestinon ), she states it works quickly but wears out very fast. She takes before driving and for work.Does not usually bother her to have double vision when at home. Stable.   Chronic Tension Headaches: seen by Dr. Melrose Nakayama, taking Topamax 100 mg dailyand it was  working well for her, she stopped medication but recurrence of symptoms, so she does not want to stop it anymore.    Abnormal CT thyroid: incidental finding of thyroid calcification of left lobe, she saw Endo and had  negative biopsy., she had an US done one year ago, accidentally given levothyroxine by endo and caused TSH suppression, seen by Dr. Loanne Drilling and he decided to monitor for now, he will see her back in one year.Unchanged  Bariatric Surgery/Obesity: she had bariatric surgery back in 2014 , her weight was almost 300 lbs, went down to close to 200lbs, butwasgradually gaining weight,she was started on Trulicity 06/270 and ZDGU44 lbs , but gained a few back since last visit. . She states medication seems to curb her appetite. She is up to 6000 steps daily. She is going out to eat again and needs to pack meals and eat at home.   OSA: she is wearing CPAP every night, all night, she does not wake up with headaches, and feels good during the day. Unchanged   Shoulder pain: she denies any trauma, but has noticed a dull pain on right anterior shoulder, worse at night and when not moving, not affected by activity. She cannot take oral nsaid's because of history of gastric bypass. She is using topical medication, she did home PT. She states she is doing better .   Excessive sweating: she had a hysterectomy in her 30's took HRT until her 27's, was doing well, but had noticed for that over the past 6  months excessive sweating all over her body, not associated with chest pain or SOB or fatigue, but happens with any form of physical activity never at rest or while sleeping. She  states walking the dog, folding clothes, washing dishes, anything that requires movement. She has DM, obesity and HTN, we referred to cardiologist and will have exercise stress test once the machine is repaired. Advised to call them back to scheduled.    Patient Active Problem List   Diagnosis Date Noted  . DOE (dyspnea on exertion) 04/15/2018  . Hyperlipidemia 12/09/2017  . Diaphoresis 11/26/2017  . Special screening for malignant neoplasms, colon   . Epiretinal membrane (ERM) of left eye 09/09/2016  . Glaucoma, left eye 09/05/2016   . Abnormal imaging of thyroid 09/15/2015  . Seronegative myasthenia gravis ([place]) 08/08/2015  . Psoriasis 07/06/2015  . Stress incontinence 07/06/2015  . Chronic tension-type headache, intractable 05/29/2015  . Anxiety and depression 04/12/2015  . Asthma, mild intermittent 04/12/2015  . Bladder polyp 04/12/2015  . Carpal tunnel syndrome 04/12/2015  . Binocular vision disorder with diplopia 04/12/2015  . Dyslipidemia 04/12/2015  . H/O: HTN (hypertension) 04/12/2015  . Hemorrhoids, internal 04/12/2015  . Neuropathy 04/12/2015  . Nuclear sclerotic cataract 04/12/2015  . Morbid obesity ([place]) 04/12/2015  . Obstructive apnea 04/12/2015  . Perennial allergic rhinitis 04/12/2015  . 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 Type 2 diabetes, controlled, with neuropathy ([place]) 04/12/2015  . Vitamin D deficiency 04/12/2015  . Cephalalgia 08/16/2014  . Lump or mass in breast 06/14/2013  . Bariatric surgery status 10/12/2012    Past Surgical History:  Procedure Laterality Date  . ABDOMINAL HYSTERECTOMY  1991  . [place] SURGERY  2014  . CATARACT EXTRACTION W/PHACO Left 01/28/2017   Procedure: CATARACT EXTRACTION PHACO AND INTRAOCULAR LENS PLACEMENT (IOC);  Surgeon: Birder Robson, MD;  Location: [facility];  Service: Ophthalmology;  Laterality: Left;  [place] 00:37 AP% 17.8 CDE 6.61 Fluid pack lot # [id number]  . COLONOSCOPY  2012  . COLONOSCOPY WITH PROPOFOL N/A 09/30/2016   Procedure: COLONOSCOPY WITH PROPOFOL;  Surgeon: Lucilla Lame, MD;  Location: [facility];  Service: Endoscopy;  Laterality: N/A;  diabetic - diet controlled sleep apnea  . KNEE SURGERY Right 1979-1980  . PARS PLANA VITRECTOMY Left 03/27/2016   Procedure: PARS PLANA VITRECTOMY WITH 25 GAUGE AND MEMBRANE PEEL, INTERNAL LIMITING MEMBRANE;  Surgeon: Hurman Horn, MD;  Location: [facility];  Service: Ophthalmology;  Laterality: Left;  . THYROID SURGERY     BX done was benign  . UPPER GI ENDOSCOPY    .  VITRECTOMY Left 04/27/2016    Family History  Problem Relation Age of Onset  . Diabetes Mother   . Hypertension Mother   . Heart disease Mother 1       stent x 1   . Muscular dystrophy Sister   . Breast cancer Neg Hx   . Thyroid disease Neg Hx     Social History   Socioeconomic History  . Marital status: Married    Spouse name: Gwyndolyn Saxon "Butch"  . Number of children: 2  . Years of education: Not on file  . Highest education level: Associate degree: occupational, Hotel manager, or vocational program  Occupational History  . Not on file  Social Needs  . Financial resource strain: Not hard at all  . Food insecurity:    Worry: Never true    Inability: Never true  . Transportation needs:    Medical: No    Non-medical: No  Tobacco Use  . Smoking status: Former Smoker    Years: 3.00    Last attempt to quit: 06/25/1975    Years since quitting: 42.9  .  Smokeless tobacco: Never Used  . Tobacco comment: smoked as teenager  Substance and Sexual Activity  . Alcohol use: No    Alcohol/week: 0.0 standard drinks  . Drug use: No  . Sexual activity: Not Currently  Lifestyle  . Physical activity:    Days per week: 7 days    Minutes per session: 30 min  . Stress: Not at all  Relationships  . Social connections:    Talks on phone: More than three times a week    Gets together: More than three times a week    Attends religious service: More than 4 times per year    Active member of club or organization: Yes    Attends meetings of clubs or organizations: 1 to 4 times per year    Relationship status: Married  . Intimate partner violence:    Fear of current or ex partner: No    Emotionally abused: No    Physically abused: No    Forced sexual activity: Not on file  Other Topics Concern  . Not on file  Social History Narrative  . Not on file     Current Outpatient Medications:  .  acetaminophen (TYLENOL) 500 MG tablet, Take 1,000 mg by mouth every 6 (six) hours as needed for mild  pain. Reported on 07/06/2015, Disp: , Rfl:  .  brimonidine (ALPHAGAN) 0.2 % ophthalmic solution, Place 1 drop into the left eye 2 (two) times daily. , Disp: , Rfl: 5 .  budesonide (RHINOCORT AQUA) 32 MCG/ACT nasal spray, Place 2 sprays into both nostrils daily. (Patient taking differently: Place 2 sprays into both nostrils at bedtime. ), Disp: 1 Bottle, Rfl: 5 .  Coenzyme Q10 (CO Q 10) 100 MG CAPS, Take 1 capsule by mouth daily., Disp: 30 capsule, Rfl: 2 .  Dulaglutide (TRULICITY) 1.5 UE/4.5WU SOPN, Inject 1.5 mg into the skin once a week., Disp: 4 pen, Rfl: 2 .  DULoxetine (CYMBALTA) 60 MG capsule, Take 1 capsule (60 mg total) by mouth daily., Disp: 90 capsule, Rfl: 1 .  fluticasone (FLONASE) 50 MCG/ACT nasal spray, Place 2 sprays into both nostrils daily., Disp: 16 g, Rfl: 6 .  glucose blood (ACCU-CHEK GUIDE) test strip, Use as instructed, Disp: 100 each, Rfl: 12 .  loratadine (CLARITIN) 10 MG tablet, Take 1 tablet (10 mg total) by mouth 2 (two) times daily., Disp: 60 tablet, Rfl: 0 .  montelukast (SINGULAIR) 10 MG tablet, Take 1 tablet (10 mg total) by mouth at bedtime., Disp: 90 tablet, Rfl: 1 .  Multiple Vitamins-Minerals (MULTIVITAMIN WITH MINERALS) tablet, Take 1 tablet by mouth daily., Disp: , Rfl:  .  Omega-3 Fatty Acids (FISH OIL) 1200 MG CAPS, Take 2 capsules by mouth daily., Disp: , Rfl:  .  pyridostigmine (MESTINON) 60 MG tablet, Take 60 mg by mouth 4 (four) times daily. 3 TO 4 TIMES DAILY, Disp: , Rfl: 4 .  rosuvastatin (CRESTOR) 5 MG tablet, Take 1 tablet (5 mg total) by mouth daily., Disp: 90 tablet, Rfl: 1 .  telmisartan-hydrochlorothiazide (MICARDIS HCT) 40-12.5 MG tablet, TAKE 1 TABLET BY MOUTH DAILY, Disp: 30 tablet, Rfl: 1 .  topiramate (TOPAMAX) 100 MG tablet, Take 1 tablet (100 mg total) by mouth at bedtime., Disp: 90 tablet, Rfl: 1 .  Travoprost, BAK Free, (TRAVATAN Z) 0.004 % SOLN ophthalmic solution, Place 1 drop into the left eye at bedtime. , Disp: , Rfl:  .  VENTOLIN HFA  108 (90 Base) MCG/ACT inhaler, INHALE 2 PUFFS BY MOUTH EVERY 4  HOURS AS NEEDED SHORTNESS OF BREATH/WHEEZING, Disp: 18 g, Rfl: 0 .  diclofenac sodium (VOLTAREN) 1 % GEL, Apply 4 g topically 4 (four) times daily. (Patient not taking: Reported on 05/25/2018), Disp: 100 g, Rfl: 0  Allergies  Allergen Reactions  . Aspirin Swelling    Upset stomach  Upset stomach   . Azithromycin     Contraindicated in Myasthenia   . Ace Inhibitors     cough  . Amlodipine     Edema of feet and hands  . Gabapentin     anxiety  . Pregabalin Other (See Comments)    anxiety    I personally reviewed active problem list, medication list, allergies, family history, social history, health maintenance with the patient/caregiver today.   ROS  Constitutional: Negative for fever or weight change.  Respiratory: Negative for cough and shortness of breath.   Cardiovascular: Negative for chest pain or palpitations.  Gastrointestinal: Negative for abdominal pain, no bowel changes.  Musculoskeletal: Negative for gait problem or joint swelling.  Skin: Negative for rash.  Neurological: Negative for dizziness or headache.  No other specific complaints in a complete review of systems (except as listed in HPI above).   Objective  Vitals:   05/25/18 1407  BP: 136/84  Pulse: 65  Resp: 16  Temp: 98 F (36.7 C)  TempSrc: Oral  SpO2: 99%  Weight: 233 lb 11.2 oz (106 kg)  Height: 5\' 2"  (1.575 m)    Body mass index is 42.74 kg/m.  Physical Exam  Constitutional: Patient appears well-developed and well-nourished. Obese  No distress.  HEENT: head atraumatic, normocephalic, pupils equal and reactive to light,  neck supple, throat within normal limits Cardiovascular: Normal rate, regular rhythm and normal heart sounds.  No murmur heard. No BLE edema. Pulmonary/Chest: Effort normal and breath sounds normal. No respiratory distress. Abdominal: Soft.  There is no tenderness. Psychiatric: Patient has a normal mood and  affect. behavior is normal. Judgment and thought content normal.  Recent Results (from the past 2160 hour(s))  Comprehensive metabolic panel     Status: Abnormal   Collection Time: 03/05/18  8:07 AM  Result Value Ref Range   Glucose 78 65 - 99 mg/dL   BUN 15 8 - 27 mg/dL   Creatinine, Ser 0.65 0.57 - 1.00 mg/dL   GFR calc non Af Amer 95 >59 mL/min/1.73   GFR calc Af Amer 110 >59 mL/min/1.73   BUN/Creatinine Ratio 23 12 - 28   Sodium 142 134 - 144 mmol/L   Potassium 4.3 3.5 - 5.2 mmol/L   Chloride 100 96 - 106 mmol/L   CO2 19 (L) 20 - 29 mmol/L   Calcium 9.3 8.7 - 10.3 mg/dL   Total Protein 7.0 6.0 - 8.5 g/dL   Albumin 4.2 3.6 - 4.8 g/dL   Globulin, Total 2.8 1.5 - 4.5 g/dL   Albumin/Globulin Ratio 1.5 1.2 - 2.2   Bilirubin Total 0.3 0.0 - 1.2 mg/dL   Alkaline Phosphatase 47 39 - 117 IU/L   AST 20 0 - 40 IU/L   ALT 13 0 - 32 IU/L  Lipid Panel w/o Chol/HDL Ratio     Status: None   Collection Time: 03/05/18  8:07 AM  Result Value Ref Range   Cholesterol, Total 175 100 - 199 mg/dL   Triglycerides 80 0 - 149 mg/dL   HDL 61 >39 mg/dL   VLDL Cholesterol Cal 16 5 - 40 mg/dL   LDL Calculated 98 0 - 99 mg/dL  TSH  Status: None   Collection Time: 03/05/18  8:07 AM  Result Value Ref Range   TSH 0.826 0.450 - 4.500 uIU/mL      PHQ2/9: Depression screen [facility] 2/9 02/20/2018 10/17/2017 06/11/2017 03/11/2017 09/05/2016  Decreased Interest 0 0 0 0 0  Down, Depressed, Hopeless 0 0 0 0 0  PHQ - 2 Score 0 0 0 0 0  Altered sleeping 0 - - - -  Tired, decreased energy 0 - - - -  Change in appetite 0 - - - -  Feeling bad or failure about yourself  0 - - - -  Trouble concentrating 0 - - - -  Moving slowly or fidgety/restless 0 - - - -  Suicidal thoughts 0 - - - -  PHQ-9 Score 0 - - - -     Fall Risk: Fall Risk  05/25/2018 02/20/2018 10/17/2017 06/11/2017 03/11/2017  Falls in the past year? 0 No No Yes No  Number falls in past yr: 0 - - 2 or more -  Injury with Fall? 0 - - No -      Functional Status Survey: Is the patient deaf or have difficulty hearing?: No Does the patient have difficulty seeing, even when wearing glasses/contacts?: Yes Does the patient have difficulty concentrating, remembering, or making decisions?: No Does the patient have difficulty walking or climbing stairs?: No Does the patient have difficulty dressing or bathing?: No Does the patient have difficulty doing errands alone such as visiting a doctor's office or shopping?: No    Assessment & Plan  1. Type 2 diabetes, controlled, with neuropathy (HCC)  - Dulaglutide (TRULICITY) 1.5 ML/4.6TK SOPN; Inject 1.5 mg into the skin once a week.  Dispense: 4 pen; Refill: 2 - DULoxetine (CYMBALTA) 60 MG capsule; Take 1 capsule (60 mg total) by mouth daily.  Dispense: 90 capsule; Refill: 1  2. Peripheral polyneuropathy  - DULoxetine (CYMBALTA) 60 MG capsule; Take 1 capsule (60 mg total) by mouth daily.  Dispense: 90 capsule; Refill: 1  3. Vitamin D deficiency  Continue supplementation   4. Obstructive apnea   5. Multiple thyroid nodules  She has follow up with Endo   6. Bariatric surgery status  She needs to stop eating out   7. Seronegative myasthenia gravis ([place])  Stable   8. Dyslipidemia associated with type 2 diabetes mellitus ([place])   9. Hypertension, benign  - telmisartan-hydrochlorothiazide (MICARDIS HCT) 40-12.5 MG tablet; Take 1 tablet by mouth daily.  Dispense: 90 tablet; Refill: 1  10. Chronic tension-type headache, intractable   11. Mild intermittent asthma without complication  Stable   12. Dyslipidemia  - rosuvastatin (CRESTOR) 5 MG tablet; Take 1 tablet (5 mg total) by mouth daily.  Dispense: 90 tablet; Refill: 1  13. Perennial allergic rhinitis  - montelukast (SINGULAIR) 10 MG tablet; Take 1 tablet (10 mg total) by mouth at bedtime.  Dispense: 90 tablet; Refill: 1

## 2018-06-12 ENCOUNTER — Ambulatory Visit (INDEPENDENT_AMBULATORY_CARE_PROVIDER_SITE_OTHER): Payer: 59

## 2018-06-12 DIAGNOSIS — R0609 Other forms of dyspnea: Secondary | ICD-10-CM | POA: Diagnosis not present

## 2018-06-12 DIAGNOSIS — R61 Generalized hyperhidrosis: Secondary | ICD-10-CM | POA: Diagnosis not present

## 2018-06-12 LAB — EXERCISE TOLERANCE TEST
CHL CUP RESTING HR STRESS: 68 {beats}/min
Estimated workload: 7 METS
Exercise duration (min): 5 min
Exercise duration (sec): 19 s
MPHR: 157 {beats}/min
Peak HR: 139 {beats}/min
Percent HR: 88 %

## 2018-07-06 ENCOUNTER — Ambulatory Visit: Payer: 59 | Admitting: Internal Medicine

## 2018-07-06 NOTE — Progress Notes (Deleted)
Follow-up Outpatient Visit Date: 07/06/2018  Primary Care Provider: Steele Sizer, [address] [place] [address]  Chief Complaint: ***  HPI:  Ms. Cookson is a 63 y.o. year-old female with history of hypertension, hyperlipidemia, morbid obesity status post bariatric surgery, myasthenia gravis, and obstructive sleep apnea, who presents for follow-up of diaphoresis.  I met her in October, at which time she reported significant diaphoresis with minimal activity.  She also noted chronic dyspnea on exertion for years.  The symptoms were atypical, we agreed to perform an exercise tolerance test for further assessment.  This was notable for poor exercise capacity and hypertensive blood pressure response.  No significant EKG changes were noted, though evaluation was limited by significant motion artifact.  --------------------------------------------------------------------------------------------------  Cardiovascular History & Procedures: Cardiovascular Problems:  Excessive diaphoresis (? Anginal equivalent)  Risk Factors:  Hypertension, hyperlipidemia, diabetes mellitus, morbid obesity, and sedentary lifestyle  Cath/PCI:  None  CV Surgery:  None  EP Procedures and Devices:  None  Non-Invasive Evaluation(s):  Exercise tolerance test (06/12/18): Probably low to intermediate risk stress test, limited by motion artifact and reduced exercise capacity.  Hypertensive blood pressure response.  Consider pharmacologic stress test or cardiac CTA for further evaluation, as clinically indicated.  CTA chest (02/03/17): No significant coronary artery or aortic calcification.  No PE.  Lungs clear.  Echo (~15 years ago): No report available, though patient states that the study was normal.  Recent CV Pertinent Labs: Lab Results  Component Value Date   CHOL 175 03/05/2018   CHOL 153 05/08/2012   HDL 61 03/05/2018   HDL 46 05/08/2012   LDLCALC 98 03/05/2018   LDLCALC 149 (H) 10/17/2017   LDLCALC 77 05/08/2012   TRIG 80 03/05/2018   TRIG 152 05/08/2012   CHOLHDL 3.7 10/17/2017   INR 0.9 05/18/2012   K 4.3 03/05/2018   K 4.4 10/17/2014   MG 2.1 05/19/2013   BUN 15 03/05/2018   BUN 21 (H) 10/17/2014   CREATININE 0.65 03/05/2018   CREATININE 0.62 10/17/2017    Past medical and surgical history were reviewed and updated in EPIC.  No outpatient medications have been marked as taking for the 07/06/18 encounter (Appointment) with Swayze Pries, Harrell Gave, MD.    Allergies: Aspirin; Azithromycin; Ace inhibitors; Amlodipine; Gabapentin; and Pregabalin  Social History   Tobacco Use  . Smoking status: Former Smoker    Years: 3.00    Last attempt to quit: 06/25/1975    Years since quitting: 43.0  . Smokeless tobacco: Never Used  . Tobacco comment: smoked as teenager  Substance Use Topics  . Alcohol use: No    Alcohol/week: 0.0 standard drinks  . Drug use: No    Family History  Problem Relation Age of Onset  . Diabetes Mother   . Hypertension Mother   . Heart disease Mother 6       stent x 1   . Muscular dystrophy Sister   . Breast cancer Neg Hx   . Thyroid disease Neg Hx     Review of Systems: A 12-system review of systems was performed and was negative except as noted in the HPI.  --------------------------------------------------------------------------------------------------  Physical Exam: There were no vitals taken for this visit.  General:  *** HEENT: No conjunctival pallor or scleral icterus. Moist mucous membranes.  OP clear. Neck: Supple without lymphadenopathy, thyromegaly, JVD, or HJR. No carotid bruit. Lungs: Normal work of breathing. Clear to auscultation bilaterally without wheezes or crackles. Heart: Regular rate and rhythm  without murmurs, rubs, or gallops. Non-displaced PMI. Abd: Bowel sounds present. Soft, NT/ND without hepatosplenomegaly Ext: No lower extremity edema. Radial, PT, and DP pulses are 2+  bilaterally. Skin: Warm and dry without rash.  EKG:  ***  Lab Results  Component Value Date   WBC 9.2 10/17/2017   HGB 14.2 10/17/2017   HCT 39.7 10/17/2017   MCV 83.1 10/17/2017   PLT 311 10/17/2017    Lab Results  Component Value Date   NA 142 03/05/2018   K 4.3 03/05/2018   CL 100 03/05/2018   CO2 19 (L) 03/05/2018   BUN 15 03/05/2018   CREATININE 0.65 03/05/2018   GLUCOSE 78 03/05/2018   ALT 13 03/05/2018    Lab Results  Component Value Date   CHOL 175 03/05/2018   HDL 61 03/05/2018   LDLCALC 98 03/05/2018   TRIG 80 03/05/2018   CHOLHDL 3.7 10/17/2017    --------------------------------------------------------------------------------------------------  ASSESSMENT AND PLAN: Harrell Gave Eastyn Dattilo, MD 07/06/2018 6:51 AM

## 2018-07-08 ENCOUNTER — Encounter: Payer: Self-pay | Admitting: Internal Medicine

## 2018-07-08 ENCOUNTER — Ambulatory Visit: Payer: 59 | Admitting: Internal Medicine

## 2018-07-08 VITALS — BP 124/70 | HR 74 | Ht 62.0 in | Wt 234.2 lb

## 2018-07-08 DIAGNOSIS — R0609 Other forms of dyspnea: Secondary | ICD-10-CM | POA: Diagnosis not present

## 2018-07-08 DIAGNOSIS — R61 Generalized hyperhidrosis: Secondary | ICD-10-CM | POA: Diagnosis not present

## 2018-07-08 NOTE — Patient Instructions (Signed)
Medication Instructions:  Your physician recommends that you continue on your current medications as directed. Please refer to the Current Medication list given to you today.  If you need a refill on your cardiac medications before your next appointment, please call your pharmacy.   Lab work: none If you have labs (blood work) drawn today and your tests are completely normal, you will receive your results only by: Marland Kitchen MyChart Message (if you have MyChart) OR . A paper copy in the mail If you have any lab test that is abnormal or we need to change your treatment, we will call you to review the results.  Testing/Procedures: Your physician has requested that you have an echocardiogram. Echocardiography is a painless test that uses sound waves to create images of your heart. It provides your doctor with information about the size and shape of your heart and how well your heart's chambers and valves are working. This procedure takes approximately one hour. There are no restrictions for this procedure. You may get an IV, if needed, to receive an ultrasound enhancing agent through to better visualize your heart.    Follow-Up: At [facility], you and your health needs are our priority.  As part of our continuing mission to provide you with exceptional heart care, we have created designated Provider Care Teams.  These Care Teams include your primary Cardiologist (physician) and Advanced Practice Providers (APPs -  Physician Assistants and Nurse Practitioners) who all work together to provide you with the care you need, when you need it. You will need a follow up appointment in 4 months.  Please call our office 2 months in advance to schedule this appointment.  You may see DR Harrell Gave END or one of the following Advanced Practice Providers on your designated Care Team:   Murray Hodgkins, NP Christell Faith, PA-C . Marrianne Mood, PA-C     Exercising to Lose Weight Exercise is structured,  repetitive physical activity to improve fitness and health. Getting regular exercise is important for everyone. It is especially important if you are overweight. Being overweight increases your risk of heart disease, stroke, diabetes, high blood pressure, and several types of cancer. Reducing your calorie intake and exercising can help you lose weight. Exercise is usually categorized as moderate or vigorous intensity. To lose weight, most people need to do a certain amount of moderate-intensity or vigorous-intensity exercise each week. Moderate-intensity exercise  Moderate-intensity exercise is any activity that gets you moving enough to burn at least three times more energy (calories) than if you were sitting. Examples of moderate exercise include:  Walking a mile in 15 minutes.  Doing light yard work.  Biking at an easy pace. Most people should get at least 150 minutes (2 hours and 30 minutes) a week of moderate-intensity exercise to maintain their body weight. Vigorous-intensity exercise Vigorous-intensity exercise is any activity that gets you moving enough to burn at least six times more calories than if you were sitting. When you exercise at this intensity, you should be working hard enough that you are not able to carry on a conversation. Examples of vigorous exercise include:  Running.  Playing a team sport, such as football, basketball, and soccer.  Jumping rope. Most people should get at least 75 minutes (1 hour and 15 minutes) a week of vigorous-intensity exercise to maintain their body weight. How can exercise affect me? When you exercise enough to burn more calories than you eat, you lose weight. Exercise also reduces body fat and builds  muscle. The more muscle you have, the more calories you burn. Exercise also:  Improves mood.  Reduces stress and tension.  Improves your overall fitness, flexibility, and endurance.  Increases bone strength. The amount of exercise you  need to lose weight depends on:  Your age.  The type of exercise.  Any health conditions you have.  Your overall physical ability. Talk to your health care provider about how much exercise you need and what types of activities are safe for you. What actions can I take to lose weight? Nutrition   Make changes to your diet as told by your health care provider or diet and nutrition specialist (dietitian). This may include: ? Eating fewer calories. ? Eating more protein. ? Eating less unhealthy fats. ? Eating a diet that includes fresh fruits and vegetables, whole grains, low-fat dairy products, and lean protein. ? Avoiding foods with added fat, salt, and sugar.  Drink plenty of water while you exercise to prevent dehydration or heat stroke. Activity  Choose an activity that you enjoy and set realistic goals. Your health care provider can help you make an exercise plan that works for you.  Exercise at a moderate or vigorous intensity most days of the week. ? The intensity of exercise may vary from person to person. You can tell how intense a workout is for you by paying attention to your breathing and heartbeat. Most people will notice their breathing and heartbeat get faster with more intense exercise.  Do resistance training twice each week, such as: ? Push-ups. ? Sit-ups. ? Lifting weights. ? Using resistance bands.  Getting short amounts of exercise can be just as helpful as long structured periods of exercise. If you have trouble finding time to exercise, try to include exercise in your daily routine. ? Get up, stretch, and walk around every 30 minutes throughout the day. ? Go for a walk during your lunch break. ? Park your car farther away from your destination. ? If you take public transportation, get off one stop early and walk the rest of the way. ? Make phone calls while standing up and walking around. ? Take the stairs instead of elevators or escalators.  Wear  comfortable clothes and shoes with good support.  Do not exercise so much that you hurt yourself, feel dizzy, or get very short of breath. Where to find more information  [place] Department of Health and Human Services: BondedCompany.at  Centers for Disease Control and Prevention (CDC): http://www.wolf.info/ Contact a health care provider:  Before starting a new exercise program.  If you have questions or concerns about your weight.  If you have a medical problem that keeps you from exercising. Get help right away if you have any of the following while exercising:  Injury.  Dizziness.  Difficulty breathing or shortness of breath that does not go away when you stop exercising.  Chest pain.  Rapid heartbeat. Summary  Being overweight increases your risk of heart disease, stroke, diabetes, high blood pressure, and several types of cancer.  Losing weight happens when you burn more calories than you eat.  Reducing the amount of calories you eat in addition to getting regular moderate or vigorous exercise each week helps you lose weight. This information is not intended to replace advice given to you by your health care provider. Make sure you discuss any questions you have with your health care provider. Document Released: 07/13/2010 Document Revised: 06/23/2017 Document Reviewed: 06/23/2017 Elsevier Interactive Patient Education  2019 [employer].  How to Increase Your Level of Physical Activity  Getting regular physical activity is important for your overall health and well-being. Most people do not get enough exercise. There are easy ways to increase your level of physical activity, even if you have not been very active in the past or you are just starting out. Why is physical activity important? Physical activity has many short-term and long-term health benefits. Regular exercise can:  Help you lose weight or maintain a healthy weight.  Strengthen your muscles and  bones.  Boost your mood and improve self-esteem.  Reduce your risk of certain long-term (chronic) diseases, like heart disease, cancer, and diabetes.  Help you stay capable of walking and moving around (mobile) as you age.  Prevent accidents, such as falls, as you age.  Increase life expectancy. What are the benefits of being physically active on a regular basis? In addition to improving your physical health, being physically active on most days of the week can help you in ways that you may not expect. Benefits of regular physical activity may include:  Feeling good about your body.  Being able to move around more easily and for longer periods of time without getting tired (increased stamina).  Finding new sources of fun and enjoyment.  Meeting new people who share a common interest.  Being able to fight off illness better (enhanced immunity).  Being able to sleep better. What can happen if I am not physically active on a regular basis? Not getting enough physical activity can lead to an unhealthy lifestyle and future health problems. This can increase your chances of:  Becoming overweight or obese.  Becoming sick.  Developing chronic illnesses, like heart disease or diabetes.  Having mental health problems, like depression or anxiety.  Having sleep problems.  Having trouble walking or getting yourself around (reduced mobility).  Injuring yourself in a fall as you get older. What steps can I take to be more physically active?  Check with your health care provider about how to get started. Ask your health care provider what activities are safe for you.  Start out slowly. Walking or doing some simple chair exercises is a good place to start, especially if you have not been active before or for a long time.  Try to find activities that you enjoy. You are more likely to commit to an exercise routine if it does not feel like a chore.  If you have bone or joint problems,  choose low-impact exercises, like walking or swimming.  Include physical activity in your everyday routine.  Invite friends or family members to exercise with you. This also will help you commit to your workout plan.  Set goals that you can work toward.  Aim for at least 150 minutes of moderate-intensity exercise each week. Examples of moderate-intensity exercise include walking or riding a bike. Where to find more information  Centers for Disease Control and Prevention: BowlingGrip.is  President's Council on Fitness, Sports & Nutrition www.http://villegas.org/  ChooseMyPlate: WirelessMortgages.dk Contact a health care provider if:  You have headaches, muscle aches, or joint pain.  You feel dizzy or light-headed while exercising.  You faint.  You have chest pain while exercising. Summary  Exercise benefits your mind and body at any age, even if you are just starting out.  If you have a chronic illness or have not been active for a while, check with your health care provider before increasing your physical activity.  Choose activities that are safe and enjoyable for  you.Ask your health care provider what activities are safe for you.  Start slowly. Tell your health care provider if you have problems as you start to increase your activity level. This information is not intended to replace advice given to you by your health care provider. Make sure you discuss any questions you have with your health care provider. Document Released: 05/30/2016 Document Revised: 05/30/2016 Document Reviewed: 05/30/2016 Elsevier Interactive Patient Education  2019 [employer].

## 2018-07-08 NOTE — Progress Notes (Signed)
Follow-up Outpatient Visit Date: 07/08/2018  Primary Care Provider: Steele Sizer, [address] [place] [address]  Chief Complaint: Follow-up diaphoresis and dyspnea on exertion.  HPI:  Jasmine Woods is a 63 y.o. year-old female with history of hypertension, hyperlipidemia, morbid obesity s/p bariatric surgery, myasthenia gravis, and obstructive sleep apnea, who presents for follow-up of exertional diaphoresis.  Today, Jasmine Woods reports she feels about the same as at our last visit.  Episodic diaphoresis is unchanged.  She notes how short of breath she became during the exercise tolerance test last year.  However, with her usual activities, she has not had any shortness of breath.  She also denies chest pain, palpitations, lightheadedness, orthopnea, PND, and edema.  --------------------------------------------------------------------------------------------------  Cardiovascular History & Procedures: Cardiovascular Problems:  Excessive diaphoresis (? Anginal equivalent)  Risk Factors:  Hypertension, hyperlipidemia, diabetes mellitus, morbid obesity, and sedentary lifestyle  Cath/PCI:  None  CV Surgery:  None  EP Procedures and Devices:  None  Non-Invasive Evaluation(s):  Exercise tolerance test (06/12/18): Low to intermediate risk study limited by motion artifact and reduced exercise capacity.  No significant ST/T changes noted in early recovery.  Hypertensive BP response noted.  CTA chest (02/03/17 - personally reviewed): No significant coronary artery or aortic calcification.  No PE.  Lungs clear.  Echo (~15 years ago): No report available, though patient states that the study was normal.  Recent CV Pertinent Labs: Lab Results  Component Value Date   CHOL 175 03/05/2018   CHOL 153 05/08/2012   HDL 61 03/05/2018   HDL 46 05/08/2012   LDLCALC 98 03/05/2018   LDLCALC 149 (H) 10/17/2017   LDLCALC 77 05/08/2012   TRIG 80 03/05/2018     TRIG 152 05/08/2012   CHOLHDL 3.7 10/17/2017   INR 0.9 05/18/2012   K 4.3 03/05/2018   K 4.4 10/17/2014   MG 2.1 05/19/2013   BUN 15 03/05/2018   BUN 21 (H) 10/17/2014   CREATININE 0.65 03/05/2018   CREATININE 0.62 10/17/2017    Past medical and surgical history were reviewed and updated in EPIC.  Current Meds  Medication Sig  . acetaminophen (TYLENOL) 500 MG tablet Take 1,000 mg by mouth every 6 (six) hours as needed for mild pain. Reported on 07/06/2015  . brimonidine (ALPHAGAN) 0.2 % ophthalmic solution Place 1 drop into the left eye 2 (two) times daily.   . budesonide (RHINOCORT AQUA) 32 MCG/ACT nasal spray Place 2 sprays into both nostrils daily. (Patient taking differently: Place 2 sprays into both nostrils at bedtime. )  . Coenzyme Q10 (CO Q 10) 100 MG CAPS Take 1 capsule by mouth daily.  . diclofenac sodium (VOLTAREN) 1 % GEL Apply 4 g topically 4 (four) times daily.  . Dulaglutide (TRULICITY) 1.5 YT/0.3TW SOPN Inject 1.5 mg into the skin once a week.  . DULoxetine (CYMBALTA) 60 MG capsule Take 1 capsule (60 mg total) by mouth daily.  . fluticasone (FLONASE) 50 MCG/ACT nasal spray Place 2 sprays into both nostrils daily.  Marland Kitchen glucose blood (ACCU-CHEK GUIDE) test strip Use as instructed  . loratadine (CLARITIN) 10 MG tablet Take 1 tablet (10 mg total) by mouth 2 (two) times daily.  . montelukast (SINGULAIR) 10 MG tablet Take 1 tablet (10 mg total) by mouth at bedtime.  . Multiple Vitamins-Minerals (MULTIVITAMIN WITH MINERALS) tablet Take 1 tablet by mouth daily.  . Omega-3 Fatty Acids (FISH OIL) 1200 MG CAPS Take 2 capsules by mouth daily.  Marland Kitchen pyridostigmine (MESTINON) 60 MG  tablet Take 60 mg by mouth 4 (four) times daily. 3 TO 4 TIMES DAILY  . rosuvastatin (CRESTOR) 5 MG tablet Take 1 tablet (5 mg total) by mouth daily.  Marland Kitchen telmisartan-hydrochlorothiazide (MICARDIS HCT) 40-12.5 MG tablet Take 1 tablet by mouth daily.  Marland Kitchen topiramate (TOPAMAX) 100 MG tablet Take 1 tablet (100 mg  total) by mouth at bedtime.  . Travoprost, BAK Free, (TRAVATAN Z) 0.004 % SOLN ophthalmic solution Place 1 drop into the left eye at bedtime.   . VENTOLIN HFA 108 (90 Base) MCG/ACT inhaler INHALE 2 PUFFS BY MOUTH EVERY 4 HOURS AS NEEDED SHORTNESS OF BREATH/WHEEZING    Allergies: Aspirin; Azithromycin; Ace inhibitors; Amlodipine; Gabapentin; and Pregabalin  Social History   Tobacco Use  . Smoking status: Former Smoker    Years: 3.00    Last attempt to quit: 06/25/1975    Years since quitting: 43.0  . Smokeless tobacco: Never Used  . Tobacco comment: smoked as teenager  Substance Use Topics  . Alcohol use: No    Alcohol/week: 0.0 standard drinks  . Drug use: No    Family History  Problem Relation Age of Onset  . Diabetes Mother   . Hypertension Mother   . Heart disease Mother 57       stent x 1   . Muscular dystrophy Sister   . Breast cancer Neg Hx   . Thyroid disease Neg Hx     Review of Systems: A 12-system review of systems was performed and was negative except as noted in the HPI.  --------------------------------------------------------------------------------------------------  Physical Exam: BP 124/70 (BP Location: Left Arm, Patient Position: Sitting, Cuff Size: Large)   Pulse 74   Ht 5\' 2"  (1.575 m)   Wt 234 lb 4 oz (106.3 kg)   BMI 42.84 kg/m   General:  NAD HEENT: No conjunctival pallor or scleral icterus. Moist mucous membranes.  OP clear. Neck: Supple without lymphadenopathy, thyromegaly, JVD, or HJR. Lungs: Normal work of breathing. Clear to auscultation bilaterally without wheezes or crackles. Heart: Regular rate and rhythm without murmurs, rubs, or gallops. Unable to assess PMI due to body habitus. Abd: Bowel sounds present. Soft, NT/ND.  Unable to assess HSM due to body habitus. Ext: No lower extremity edema. Skin: Warm and dry without rash.  EKG:  NSR without abnormality.  Lab Results  Component Value Date   WBC 9.2 10/17/2017   HGB 14.2  10/17/2017   HCT 39.7 10/17/2017   MCV 83.1 10/17/2017   PLT 311 10/17/2017    Lab Results  Component Value Date   NA 142 03/05/2018   K 4.3 03/05/2018   CL 100 03/05/2018   CO2 19 (L) 03/05/2018   BUN 15 03/05/2018   CREATININE 0.65 03/05/2018   GLUCOSE 78 03/05/2018   ALT 13 03/05/2018    Lab Results  Component Value Date   CHOL 175 03/05/2018   HDL 61 03/05/2018   LDLCALC 98 03/05/2018   TRIG 80 03/05/2018   CHOLHDL 3.7 10/17/2017    --------------------------------------------------------------------------------------------------  ASSESSMENT AND PLAN: Diaphoresis and dyspnea on exertion These symptoms could be due to a number of factors, most likely driven by morbid obesity and deconditioning.  Exercise tolerance test was notable for poor exercise capacity and hypertensive blood pressure response.  No ischemic EKG changes were observed, though evaluation was somewhat limited by motion artifact.  We discussed further ischemia evaluation but have agreed to defer this in favor of weight loss and exercise.  We will also obtain a transthoracic  echocardiogram to exclude structural abnormalities that may underlying decreased exercise capacity.  Morbid obesity Weight loss through diet and exercise encouraged.  Nelva Bush, MD 07/09/2018 8:54 PM

## 2018-07-09 ENCOUNTER — Encounter: Payer: Self-pay | Admitting: Internal Medicine

## 2018-07-28 ENCOUNTER — Ambulatory Visit (INDEPENDENT_AMBULATORY_CARE_PROVIDER_SITE_OTHER): Payer: 59

## 2018-07-28 DIAGNOSIS — R0609 Other forms of dyspnea: Secondary | ICD-10-CM | POA: Diagnosis not present

## 2018-07-31 ENCOUNTER — Ambulatory Visit: Payer: 59 | Admitting: Nurse Practitioner

## 2018-07-31 ENCOUNTER — Encounter: Payer: Self-pay | Admitting: Nurse Practitioner

## 2018-07-31 VITALS — BP 128/74 | HR 73 | Temp 98.9°F | Resp 16 | Ht 62.0 in | Wt 234.6 lb

## 2018-07-31 DIAGNOSIS — J069 Acute upper respiratory infection, unspecified: Secondary | ICD-10-CM | POA: Diagnosis not present

## 2018-07-31 MED ORDER — BENZONATATE 200 MG PO CAPS: 200.0000 mg | ORAL_CAPSULE | Freq: Two times a day (BID) | ORAL | 0 refills | Status: DC | PRN

## 2018-07-31 NOTE — Progress Notes (Signed)
Name: Jasmine Woods   MRN: [id number]    DOB: September 07, 1955   Date:07/31/2018       Progress Note  Subjective  Chief Complaint  Chief Complaint  Patient presents with  . Cough    mucinex  . Sinus Problem  . Wheezing  . Nasal Congestion  . Headache     otc tylenol  . Scratchy throat    HPI  Patient endorses sore throat that started last week then got rhinorrhea then chest congestion. Now she has strong productive cough with yellow phlegm, headaches, sinus fullness, nasal congestion. Sore throat resolved. She has needed her albuterol inhaler at least once a day- feels mucus gets stuck at night. Wears CPAP.   Patient husband diagnosed with URI on 2/4. Has been taking tylenol and musinex and delsym.   Patient Active Problem List   Diagnosis Date Noted  . DOE (dyspnea on exertion) 04/15/2018  . Hyperlipidemia 12/09/2017  . Diaphoresis 11/26/2017  . Special screening for malignant neoplasms, colon   . Epiretinal membrane (ERM) of left eye 09/09/2016  . Glaucoma, left eye 09/05/2016  . Abnormal imaging of thyroid 09/15/2015  . Seronegative myasthenia gravis ([place]) 08/08/2015  . Psoriasis 07/06/2015  . Stress incontinence 07/06/2015  . Chronic tension-type headache, intractable 05/29/2015  . Anxiety and depression 04/12/2015  . Asthma, mild intermittent 04/12/2015  . Bladder polyp 04/12/2015  . Carpal tunnel syndrome 04/12/2015  . Binocular vision disorder with diplopia 04/12/2015  . Dyslipidemia 04/12/2015  . H/O: HTN (hypertension) 04/12/2015  . Hemorrhoids, internal 04/12/2015  . Neuropathy 04/12/2015  . Nuclear sclerotic cataract 04/12/2015  . Morbid obesity ([place]) 04/12/2015  . Obstructive apnea 04/12/2015  . Perennial allergic rhinitis 04/12/2015  . 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 Type 2 diabetes, controlled, with neuropathy ([place]) 04/12/2015  . Vitamin D deficiency 04/12/2015  . Cephalalgia 08/16/2014  . Lump or mass in breast  06/14/2013  . Bariatric surgery status 10/12/2012    Past Medical History:  Diagnosis Date  . Allergy   . Anxiety   . Asthma   . Bladder polyp    Dr. Ernst Spell  . Carpal tunnel syndrome   . Depression   . Diabetes mellitus without complication ([place])    Diet controlled  . Double vision    seen by Dr. 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 Dyslipidemia   . Frequency of urination   . Glaucoma, left eye   . H/O myasthenia gravis    MOSTLY AFFECTS EYES  . HA (headache)   . Heart murmur    followed by PCP  . Hx of bariatric surgery   . Hypertension    no meds  . Internal hemorrhoids   . Myasthenia gravis ([place])    "mostly effects eyes"  . Neuropathy, generalized    diabetic on feet  . Nuclear sclerotic cataract of both eyes   . Obesity (BMI 30-39.9)   . Obstructive sleep apnea on CPAP    uses cpap  . Patient is Jehovah's Witness    no blood products  . Rotator cuff tendonitis 07/2009   also has impingment right shoulder pain, seen by Dr. Mack Guise  . Sleep apnea   . Thyroid nodule   . Vaginal dryness, menopausal   . Vitamin D deficiency     Past Surgical History:  Procedure Laterality Date  . ABDOMINAL HYSTERECTOMY  1991  . [place] SURGERY  2014  . CATARACT EXTRACTION W/PHACO Left 01/28/2017   Procedure: CATARACT EXTRACTION PHACO  AND INTRAOCULAR LENS PLACEMENT (IOC);  Surgeon: Birder Robson, MD;  Location: [facility];  Service: Ophthalmology;  Laterality: Left;  [place] 00:37 AP% 17.8 CDE 6.61 Fluid pack lot # [id number]  . COLONOSCOPY  2012  . COLONOSCOPY WITH PROPOFOL N/A 09/30/2016   Procedure: COLONOSCOPY WITH PROPOFOL;  Surgeon: Lucilla Lame, MD;  Location: [facility];  Service: Endoscopy;  Laterality: N/A;  diabetic - diet controlled sleep apnea  . KNEE SURGERY Right 1979-1980  . PARS PLANA VITRECTOMY Left 03/27/2016   Procedure: PARS PLANA VITRECTOMY WITH 25 GAUGE AND MEMBRANE PEEL, INTERNAL LIMITING MEMBRANE;  Surgeon: Hurman Horn, MD;  Location: [place];  Service: Ophthalmology;  Laterality: Left;  . THYROID SURGERY     BX done was benign  . UPPER GI ENDOSCOPY    . VITRECTOMY Left 04/27/2016    Social History   Tobacco Use  . Smoking status: Former Smoker    Years: 3.00    Last attempt to quit: 06/25/1975    Years since quitting: 43.1  . Smokeless tobacco: Never Used  . Tobacco comment: smoked as teenager  Substance Use Topics  . Alcohol use: No    Alcohol/week: 0.0 standard drinks     Current Outpatient Medications:  .  acetaminophen (TYLENOL) 500 MG tablet, Take 1,000 mg by mouth every 6 (six) hours as needed for mild pain. Reported on 07/06/2015, Disp: , Rfl:  .  brimonidine (ALPHAGAN) 0.2 % ophthalmic solution, Place 1 drop into the left eye 2 (two) times daily. , Disp: , Rfl: 5 .  budesonide (RHINOCORT AQUA) 32 MCG/ACT nasal spray, Place 2 sprays into both nostrils daily. (Patient taking differently: Place 2 sprays into both nostrils at bedtime. ), Disp: 1 Bottle, Rfl: 5 .  Coenzyme Q10 (CO Q 10) 100 MG CAPS, Take 1 capsule by mouth daily., Disp: 30 capsule, Rfl: 2 .  diclofenac sodium (VOLTAREN) 1 % GEL, Apply 4 g topically 4 (four) times daily., Disp: 100 g, Rfl: 0 .  Dulaglutide (TRULICITY) 1.5 LS/9.3TD SOPN, Inject 1.5 mg into the skin once a week., Disp: 4 pen, Rfl: 2 .  DULoxetine (CYMBALTA) 60 MG capsule, Take 1 capsule (60 mg total) by mouth daily., Disp: 90 capsule, Rfl: 1 .  fluticasone (FLONASE) 50 MCG/ACT nasal spray, Place 2 sprays into both nostrils daily., Disp: 16 g, Rfl: 6 .  glucose blood (ACCU-CHEK GUIDE) test strip, Use as instructed, Disp: 100 each, Rfl: 12 .  loratadine (CLARITIN) 10 MG tablet, Take 1 tablet (10 mg total) by mouth 2 (two) times daily., Disp: 60 tablet, Rfl: 0 .  montelukast (SINGULAIR) 10 MG tablet, Take 1 tablet (10 mg total) by mouth at bedtime., Disp: 90 tablet, Rfl: 1 .  Multiple Vitamins-Minerals (MULTIVITAMIN WITH MINERALS) tablet, Take 1 tablet by mouth daily., Disp: , Rfl:  .   Omega-3 Fatty Acids (FISH OIL) 1200 MG CAPS, Take 2 capsules by mouth daily., Disp: , Rfl:  .  pyridostigmine (MESTINON) 60 MG tablet, Take 60 mg by mouth 4 (four) times daily. 3 TO 4 TIMES DAILY, Disp: , Rfl: 4 .  rosuvastatin (CRESTOR) 5 MG tablet, Take 1 tablet (5 mg total) by mouth daily., Disp: 90 tablet, Rfl: 1 .  telmisartan-hydrochlorothiazide (MICARDIS HCT) 40-12.5 MG tablet, Take 1 tablet by mouth daily., Disp: 90 tablet, Rfl: 1 .  topiramate (TOPAMAX) 100 MG tablet, Take 1 tablet (100 mg total) by mouth at bedtime., Disp: 90 tablet, Rfl: 1 .  Travoprost, BAK Free, (TRAVATAN Z) 0.004 %  SOLN ophthalmic solution, Place 1 drop into the left eye at bedtime. , Disp: , Rfl:  .  VENTOLIN HFA 108 (90 Base) MCG/ACT inhaler, INHALE 2 PUFFS BY MOUTH EVERY 4 HOURS AS NEEDED SHORTNESS OF BREATH/WHEEZING, Disp: 18 g, Rfl: 0  Allergies  Allergen Reactions  . Aspirin Swelling    Upset stomach  Upset stomach   . Azithromycin     Contraindicated in Myasthenia   . Ace Inhibitors     cough  . Amlodipine     Edema of feet and hands  . Gabapentin     anxiety  . Pregabalin Other (See Comments)    anxiety    ROS   No other specific complaints in a complete review of systems (except as listed in HPI above).  Objective  Vitals:   07/31/18 1614  BP: 128/74  Pulse: 73  Resp: 16  Temp: 98.9 F (37.2 C)  TempSrc: Oral  SpO2: 98%  Weight: 234 lb 9.6 oz (106.4 kg)  Height: 5\' 2"  (1.575 m)    Body mass index is 42.91 kg/m.  Nursing Note and Vital Signs reviewed.  Physical Exam HENT:     Head: Normocephalic and atraumatic.     Right Ear: Hearing, ear canal and external ear normal. A middle ear effusion is present.     Left Ear: Hearing, tympanic membrane, ear canal and external ear normal.     Nose: Nose normal.     Right Sinus: No maxillary sinus tenderness or frontal sinus tenderness.     Left Sinus: No maxillary sinus tenderness or frontal sinus tenderness.     Mouth/Throat:      Mouth: Mucous membranes are moist.     Pharynx: Uvula midline. No oropharyngeal exudate or posterior oropharyngeal erythema.  Eyes:     General:        Right eye: No discharge.        Left eye: No discharge.     Conjunctiva/sclera: Conjunctivae normal.  Neck:     Musculoskeletal: Normal range of motion.  Cardiovascular:     Rate and Rhythm: Normal rate.  Pulmonary:     Effort: Pulmonary effort is normal.     Breath sounds: Normal breath sounds.  Lymphadenopathy:     Cervical: No cervical adenopathy.  Skin:    General: Skin is warm and dry.     Findings: No rash.  Neurological:     Mental Status: She is alert.  Psychiatric:        Judgment: Judgment normal.      No results found for this or any previous visit (from the past 48 hour(s)).  Assessment & Plan  1. Viral upper respiratory tract infection Discussed deep breathing, musinex, fluids to thin out secretions. Coughing before bed time and using albuterol then to loosen up and remove secretion before bed time. Discussed pneumonia prevention. ROC for worsening symptoms of lack of improvement over the weekend.  - benzonatate (TESSALON) 200 MG capsule; Take 1 capsule (200 mg total) by mouth 2 (two) times daily as needed for cough.  Dispense: 20 capsule; Refill: 0

## 2018-07-31 NOTE — Patient Instructions (Addendum)
You likely have a viral upper respiratory infection (URI). Antibiotics will not reduce the number of days you are ill or prevent you from getting bacterial rhinosinusitis. A URI can take up to 14 days to resolve, but typically last between 7-11 days. Your body is so smart and strong that it will be fighting this illness off for you but it is important that you drink plenty of fluids, rest. Cover your nose/mouth when you cough or sneeze and wash your hands well and often. Here are some helpful things you can use or pick up over the counter from the pharmacy to help with your symptoms:   For Fever/Pain: Acetaminophen every 6 hours as needed (maximum of 3000mg  a day). If you are still uncomfortable you can add ibuprofen OR naproxen  For coughing: try dextromethorphan for a cough suppressant, and/or a cool mist humidifier, lozenges  For sore throat: saline gargles, honey herbal tea, lozenges, throat spray  To dry out your nose: try an antihistamine like loratadine (non-sedating) or diphenhydramine (sedating) or others To relieve a stuffy nose: try flonase, neti pot To make blowing your nose easier and relieve chest congestion: guaifenesin 400mg  every 4-6 hours of guaifenesin ER [phone] mg every 12 hours. Do not take more than 2,400mg  a day.

## 2018-08-19 ENCOUNTER — Ambulatory Visit (INDEPENDENT_AMBULATORY_CARE_PROVIDER_SITE_OTHER): Payer: Self-pay | Admitting: Physician Assistant

## 2018-08-19 VITALS — BP 134/90 | HR 64 | Temp 98.4°F | Resp 16 | Wt 232.0 lb

## 2018-08-19 DIAGNOSIS — R2241 Localized swelling, mass and lump, right lower limb: Secondary | ICD-10-CM

## 2018-08-19 MED ORDER — DICLOFENAC SODIUM 1 % TD GEL: 4.0000 g | Freq: Four times a day (QID) | TRANSDERMAL | 0 refills | Status: AC

## 2018-08-19 NOTE — Progress Notes (Signed)
Patient ID: Jasmine Woods DOB: 05-12-1956 AGE: 63 y.o. MRN: [id number]   PCP: Jasmine Sizer, MD   Chief Complaint:  Chief Complaint  Patient presents with  . right lower leg knot w/pain     Subjective:    HPI:  Jasmine Woods is a 63 y.o. female presents for evaluation  Chief Complaint  Patient presents with  . right lower leg knot w/pain    63 year old female presents to [facility] with 1-1/2 week history of mass on right lower leg. Just inferior to knee, slightly lateral of mid anterior line. Began as pea sized. Very firm/hard. Associated tenderness/soreness; primarily with palpation. Over past 1-1/2 weeks has enlarged in size and softened. No known precipitating injury/trauma. No recent increase in activity. No pain with weightbearing. No associated open wound, erythema, or ecchymosis. No previous history of similar mass. Has taken OTC Tylenol with moderate pain improvement. Denies fever, chills, sweats, headache, knee popping/crackling, knee locking/catching, knee instability/giving out/buckling sensation, radiating pain, leg weakness/paresthesias (other than per her normal foot neuropathy / burning sensation).  Patient seen by PCP, Jasmine Cheshire NP with [facility], on 07/31/2018 for viral URI. Prescribed Tessalon Perles. Symptoms resolved.  Patient with DM2. Was off of medication s/p bariatric surgery in 2014; however, resumed medication on 40/9811 (Trulicity). Last A1C 5.5% on 02/20/2018. Patient with associated neuropathy, since 2010, burning sensation in bilateral feet, managed on Cymbalta (failed Gabapentin and Lyrica). Patient with diagnosis of Myasthenia Gravis, April 2017. Seronegative. Has ptosis and diplopia. Negative chest CT for thymoma. On Mestinon. Patient takes prior to driving.  A limited review of symptoms was performed, pertinent positives and negatives as mentioned in HPI.  The following portions of the  patient's history were reviewed and updated as appropriate: allergies, current medications and past medical history.  Patient Active Problem List   Diagnosis Date Noted  . DOE (dyspnea on exertion) 04/15/2018  . Hyperlipidemia 12/09/2017  . Diaphoresis 11/26/2017  . Special screening for malignant neoplasms, colon   . Epiretinal membrane (ERM) of left eye 09/09/2016  . Glaucoma, left eye 09/05/2016  . Abnormal imaging of thyroid 09/15/2015  . Seronegative myasthenia gravis ([place]) 08/08/2015  . Psoriasis 07/06/2015  . Stress incontinence 07/06/2015  . Chronic tension-type headache, intractable 05/29/2015  . Anxiety and depression 04/12/2015  . Asthma, mild intermittent 04/12/2015  . Bladder polyp 04/12/2015  . Carpal tunnel syndrome 04/12/2015  . Binocular vision disorder with diplopia 04/12/2015  . Dyslipidemia 04/12/2015  . H/O: HTN (hypertension) 04/12/2015  . Hemorrhoids, internal 04/12/2015  . Neuropathy 04/12/2015  . Nuclear sclerotic cataract 04/12/2015  . Morbid obesity ([place]) 04/12/2015  . Obstructive apnea 04/12/2015  . Perennial allergic rhinitis 04/12/2015  . 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 Type 2 diabetes, controlled, with neuropathy ([place]) 04/12/2015  . Vitamin D deficiency 04/12/2015  . Cephalalgia 08/16/2014  . Lump or mass in breast 06/14/2013  . Bariatric surgery status 10/12/2012    Allergies  Allergen Reactions  . Aspirin Swelling    Upset stomach  Upset stomach   . Azithromycin     Contraindicated in Myasthenia   . Ace Inhibitors     cough  . Amlodipine     Edema of feet and hands  . Gabapentin     anxiety  . Pregabalin Other (See Comments)    anxiety    Current Outpatient Medications on File Prior to Visit  Medication Sig Dispense Refill  . acetaminophen (TYLENOL) 500 MG tablet  Take 1,000 mg by mouth every 6 (six) hours as needed for mild pain. Reported on 07/06/2015    . brimonidine (ALPHAGAN) 0.2 % ophthalmic  solution Place 1 drop into the left eye 2 (two) times daily.   5  . budesonide (RHINOCORT AQUA) 32 MCG/ACT nasal spray Place 2 sprays into both nostrils daily. (Patient taking differently: Place 2 sprays into both nostrils at bedtime. ) 1 Bottle 5  . Coenzyme Q10 (CO Q 10) 100 MG CAPS Take 1 capsule by mouth daily. 30 capsule 2  . Dulaglutide (TRULICITY) 1.5 KD/3.2IZ SOPN Inject 1.5 mg into the skin once a week. 4 pen 2  . DULoxetine (CYMBALTA) 60 MG capsule Take 1 capsule (60 mg total) by mouth daily. 90 capsule 1  . glucose blood (ACCU-CHEK GUIDE) test strip Use as instructed 100 each 12  . loratadine (CLARITIN) 10 MG tablet Take 1 tablet (10 mg total) by mouth 2 (two) times daily. 60 tablet 0  . montelukast (SINGULAIR) 10 MG tablet Take 1 tablet (10 mg total) by mouth at bedtime. 90 tablet 1  . Multiple Vitamins-Minerals (MULTIVITAMIN WITH MINERALS) tablet Take 1 tablet by mouth daily.    . Omega-3 Fatty Acids (FISH OIL) 1200 MG CAPS Take 2 capsules by mouth daily.    Marland Kitchen pyridostigmine (MESTINON) 60 MG tablet Take 60 mg by mouth 4 (four) times daily. 3 TO 4 TIMES DAILY  4  . rosuvastatin (CRESTOR) 5 MG tablet Take 1 tablet (5 mg total) by mouth daily. 90 tablet 1  . telmisartan-hydrochlorothiazide (MICARDIS HCT) 40-12.5 MG tablet Take 1 tablet by mouth daily. 90 tablet 1  . topiramate (TOPAMAX) 100 MG tablet Take 1 tablet (100 mg total) by mouth at bedtime. 90 tablet 1  . Travoprost, BAK Free, (TRAVATAN Z) 0.004 % SOLN ophthalmic solution Place 1 drop into the left eye at bedtime.     . VENTOLIN HFA 108 (90 Base) MCG/ACT inhaler INHALE 2 PUFFS BY MOUTH EVERY 4 HOURS AS NEEDED SHORTNESS OF BREATH/WHEEZING 18 g 0   No current facility-administered medications on file prior to visit.        Objective:   Vitals:   08/19/18 1546  BP: 134/90  Pulse: 64  Resp: 16  Temp: 98.4 F (36.9 C)  SpO2: 96%     Wt Readings from Last 3 Encounters:  08/19/18 232 lb (105.2 kg)  07/31/18 234 lb 9.6  oz (106.4 kg)  07/08/18 234 lb 4 oz (106.3 kg)    Physical Exam:   General Appearance:  Patient sitting comfortably on examination table. Conversational. Kermit Balo self-historian. In no acute distress. Afebrile.   Head:  Normocephalic, without obvious abnormality, atraumatic  Neck: Supple, symmetrical, trachea midline, no adenopathy  Lungs:   Clear to auscultation bilaterally, respirations unlabored.   Heart:  Regular rate and rhythm  Extremities: Left leg normal to inspection. Used for comparison. No similar mass on left lower leg. Right leg inspection reveals very minimal visible mass/enlargement 2cm lateral and inferior to lateral/inferior joint space. No open wound. No erythema. No ecchymosis. Upon palpation, palpable 2x3cm induration. Softer than calcification. More firm than lipoma or cyst. No fluctuance. Minimally mobile. Mild tenderness with palpation. Right knee with full ROM. 5/5 motor strength. No movement of mass with movement of knee. No gait abnormality. No pain with weightbearing on right knee.  Pulses: 2+ and symmetric  Skin: Skin color, texture, turgor normal, no rashes or lesions  Lymph nodes: Cervical, supraclavicular, and axillary nodes normal  Neurologic: Normal  Assessment & Plan:    Exam findings, diagnosis etiology and medication use and indications reviewed with patient. Follow-Up and discharge instructions provided. No emergent/urgent issues found on exam.  Patient education was provided.   Patient verbalized understanding of information provided and agrees with plan of care (POC), all questions answered. The patient is advised to call or return to clinic if condition does not see an improvement in symptoms, or to seek the care of the closest emergency department if condition worsens with the below plan.    1. Subcutaneous mass of right lower leg - diclofenac sodium (VOLTAREN) 1 % GEL; Apply 4 g topically 4 (four) times daily for 7 days.  Dispense: 100 g; Refill:  0  Patient with 1-1/2 week history of soft tissue mass on right lower leg; superior/lateral aspect. VSS, afebrile, in no acute distress, no associated consitutional symptoms, no knee joint involvement. Other than lack of ecchymosis, consistent with hematoma. No known injury/trauma. Not classic presentation for abscess, ganglion cyst, lipoma, lymphoma - however, included in differential. Discussed possibilities with patient. Given softening and expansion of mass, believe resolving hematoma. Advised warm compresses, gentle massage and topical Voltaren (patient s/p gastric sleeve, has tolerated oral NSAIDs in the past, has also used topical Voltaren per orthopedist for shoulder injury with pain relief). Advised patient follow-up with PCP in one week if no resolution of mass; at that time, imaging may be warranted. Also, may be referred to dermatology for biopsy. Patient agreed with plan.   Darlin Priestly, MHS, PA-C Montey Hora, MHS, PA-C Advanced Practice Provider [facility]  [address] [facility], [place], [place] [address] (p):  [phone] [email or web address]

## 2018-08-19 NOTE — Patient Instructions (Signed)
Thank you for choosing [facility] for your health care needs.  You have been diagnosed with a soft tissue mass on right lower leg.  Suspect may be a hematoma (a bruise).  Recommend continuing Tylenol for pain/discomfort. Apply warm compresses, 15-20 minutes at a time, several times a day. Gently massage area.  May apply prescription anti-inflammatory gel. Meds ordered this encounter  Medications  . diclofenac sodium (VOLTAREN) 1 % GEL    Sig: Apply 4 g topically 4 (four) times daily for 7 days.    Dispense:  100 g    Refill:  0    Order Specific Question:   Supervising Provider    Answer:   Noemi Chapel [[id number]]   Follow-up with family physician in one week if symptoms not improving. Follow-up sooner with worsening symptoms; such as fever, mass enlarges, worsening pain, leg weakness, or other new/concerning symptom.  Hope you feel better soon!  Hematoma A hematoma is a collection of blood. A hematoma can happen:  Under the skin.  In an organ.  In a body space.  In a joint space.  In other tissues. The blood can thicken (clot) to form a lump that you can see and feel. The lump is often hard and may become sore and tender. The lump can be very small or very big. Most hematomas get better in a few days to weeks. However, some hematomas may be serious and need medical care. What are the causes? This condition is caused by:  An injury.  Blood that leaks under the skin.  Problems from surgeries.  Medical conditions that cause bleeding or bruising. What increases the risk? You are more likely to develop this condition if:  You are an older adult.  You use medicines that thin your blood. What are the signs or symptoms? Symptoms depend on where the hematoma is in your body.  If the hematoma is under the skin, there is: ? A firm lump on the body. ? Pain and tenderness in the area. ? Bruising. The skin above the lump may be blue, dark blue, purple-red, or  yellowish.  If the hematoma is deep in the tissues or body spaces, there may be: ? Blood in the stomach. This may cause pain in the belly (abdomen), weakness, passing out (fainting), and shortness of breath. ? Blood in the head. This may cause a headache, weakness, trouble speaking or understanding speech, or passing out. How is this diagnosed? This condition is diagnosed based on:  Your medical history.  A physical exam.  Imaging tests, such as ultrasound or CT scan.  Blood tests. How is this treated? Treatment depends on the cause, size, and location of the hematoma. Treatment may include:  Doing nothing. Many hematomas go away on their own without treatment.  Surgery or close monitoring. This may be needed for large hematomas or hematomas that affect the body's organs.  Medicines. These may be given if a medical condition caused the hematoma. Follow these instructions at home: Managing pain, stiffness, and swelling   If told, put ice on the area. ? Put ice in a plastic bag. ? Place a towel between your skin and the bag. ? Leave the ice on for 20 minutes, 2-3 times a day for the first two days.  If told, put heat on the affected area after putting ice on the area for two days. Use the heat source that your doctor tells you to use. This could be a moist heat pack or a heating  pad. To do this: ? Place a towel between your skin and the heat source. ? Leave the heat on for 20-30 minutes. ? Remove the heat if your skin turns bright red. This is very important if you are unable to feel pain, heat, or cold. You may have a greater risk of getting burned.  Raise (elevate) the affected area above the level of your heart while you are sitting or lying down.  Wrap the affected area with an elastic bandage, if told by your doctor. Do not wrap the bandage too tightly.  If your hematoma is on a leg or foot and is painful, your doctor may give you crutches. Use them as told by your  doctor. General instructions  Take over-the-counter and prescription medicines only as told by your doctor.  Keep all follow-up visits as told by your doctor. This is important. Contact a doctor if:  You have a fever.  The swelling or bruising gets worse.  You start to get more hematomas. Get help right away if:  Your pain gets worse.  Your pain is not getting better with medicine.  Your skin over the hematoma breaks or starts to bleed.  Your hematoma is in your chest or belly and you: ? Pass out. ? Feel weak. ? Become short of breath.  You have a hematoma on your scalp that is caused by a fall or injury, and you: ? Have a headache that gets worse. ? Have trouble speaking or understanding speech. ? Become less alert or you pass out. Summary  A hematoma is a collection of blood in any part of your body.  Most hematomas get better on their own in a few days to weeks. Some may need medical care.  Follow instructions from your doctor about how to care for your hematoma.  Contact a doctor if the swelling or bruising gets worse, or if you are short of breath. This information is not intended to replace advice given to you by your health care provider. Make sure you discuss any questions you have with your health care provider. Document Released: 07/18/2004 Document Revised: 11/13/2017 Document Reviewed: 11/13/2017 Elsevier Interactive Patient Education  2019 [employer].

## 2018-08-21 ENCOUNTER — Telehealth: Payer: Self-pay | Admitting: Emergency Medicine

## 2018-08-21 NOTE — Telephone Encounter (Signed)
Left message following up on visit with [facility] 

## 2018-08-24 ENCOUNTER — Ambulatory Visit: Payer: 59 | Admitting: Family Medicine

## 2018-09-22 ENCOUNTER — Ambulatory Visit (INDEPENDENT_AMBULATORY_CARE_PROVIDER_SITE_OTHER): Payer: 59 | Admitting: Family Medicine

## 2018-09-22 ENCOUNTER — Encounter: Payer: Self-pay | Admitting: Family Medicine

## 2018-09-22 VITALS — BP 130/77 | Temp 98.0°F | Ht 62.0 in | Wt 238.0 lb

## 2018-09-22 DIAGNOSIS — G629 Polyneuropathy, unspecified: Secondary | ICD-10-CM | POA: Diagnosis not present

## 2018-09-22 DIAGNOSIS — R0602 Shortness of breath: Secondary | ICD-10-CM

## 2018-09-22 DIAGNOSIS — E1169 Type 2 diabetes mellitus with other specified complication: Secondary | ICD-10-CM | POA: Diagnosis not present

## 2018-09-22 DIAGNOSIS — J4521 Mild intermittent asthma with (acute) exacerbation: Secondary | ICD-10-CM | POA: Diagnosis not present

## 2018-09-22 DIAGNOSIS — E1140 Type 2 diabetes mellitus with diabetic neuropathy, unspecified: Secondary | ICD-10-CM | POA: Diagnosis not present

## 2018-09-22 DIAGNOSIS — I10 Essential (primary) hypertension: Secondary | ICD-10-CM | POA: Diagnosis not present

## 2018-09-22 DIAGNOSIS — G4733 Obstructive sleep apnea (adult) (pediatric): Secondary | ICD-10-CM

## 2018-09-22 DIAGNOSIS — G7000 Myasthenia gravis without (acute) exacerbation: Secondary | ICD-10-CM

## 2018-09-22 DIAGNOSIS — E785 Hyperlipidemia, unspecified: Secondary | ICD-10-CM

## 2018-09-22 DIAGNOSIS — J3089 Other allergic rhinitis: Secondary | ICD-10-CM

## 2018-09-22 MED ORDER — DULAGLUTIDE 1.5 MG/0.5ML ~~LOC~~ SOAJ: 1.5000 mg | SUBCUTANEOUS | 2 refills | Status: DC

## 2018-09-22 MED ORDER — BUDESONIDE 32 MCG/ACT NA SUSP: 2.0000 | Freq: Every day | NASAL | 2 refills | Status: DC

## 2018-09-22 NOTE — Progress Notes (Signed)
Name: Jasmine Woods   MRN: [id number]    DOB: 17-Sep-1955   Date:09/22/2018       Progress Note  Subjective  Chief Complaint  Chief Complaint  Patient presents with  . Medication Refill    3 month F/U-Reschedule Eye Exam for May  . Diabetes    Has been doing good-checks every day. Average BS-90 Fasting, one low-77 high-130  . Asthma    Has been fine with Singulair  . Hyperlipidemia  . Sleep Apnea  . Myasthenia Gravis    I connected with@ on 09/22/18 at  1:40 PM EDT by a video enabled telemedicine application and verified that I am speaking with the correct person using two identifiers.  I discussed the limitations of evaluation and management by telemedicine and the availability of in person appointments. The patient expressed understanding and agreed to proceed. Staff also discussed with the patient that there may be a patient responsible charge related to this service. Patient Location: at work  Provider Location: [facility]   HPI  DMII:She was off medication afterbariatric surgery in 2014,but we re-started medication 40/9811 - Trulicity,  she denies polyphagia, polydipsia or polyuria. She has been more active, walking at least5000 steps daily. hgbA1C from 5.6% to 5.9% down to 5.7%and up to 5.8%,5.6%, 5.5%, glucose at home has been around 85 's.Neuropathy is still 2/10, constant, burning-like on both feet, she is on Cymbalta. She could not tolerate gabapentin or Lyrica and also did not work. Doing well, but gaining weight.   Hyperlipidemia: takingCrestor, taking Co-Q 10 andhas noticed improvement ofmuscle aches, no chest pain or SOB. Last lipid panel was a to goal , recheck next visit   Asthma:she is doing very well now, no cough, wheezing , she continues to have SOB and diaphoresis. She has a rescue medication at home. She is wiling to see pulmonologist. She states after treadmill she had a coughing spell.   Myasthenia Gravis: diagnosed at [facility]  by Dr. Burnett Harry, 09/2015, seronegative, only causing ptosis and double vision, no other symptoms. She had a negative chest CT for thymoma, and has started on medication ( Mestinon ), she states it works quickly but wears out very fast. She takes before driving and for work.Does not usually bother her to have double vision when at home.   Chronic Tension Headaches: seen by Dr. Melrose Nakayama, she was  taking Topamax 100 mg because it was causing mental fogginess and is doing well at this time   Abnormal CT thyroid: incidental finding of thyroid calcification of left lobe, she saw Endo and had negative biopsy., she had an US done one year ago, accidentally given levothyroxine by endo and caused TSH suppression,seen by Dr. Loanne Drilling and he decided to monitor for now, he will see her back in one year. Unchanged   Bariatric Surgery/Obesity: she had bariatric surgery back in 2014 , her weight was almost 300 lbs, went down to close to 200lbs, butwasgradually gaining weight,she was started on Trulicity 91/4782 and NFAO13 lbs, but gained a few back since last visit. She states medication seems to curb her appetite. She is walking 5000 steps now   OSA: she is wearing CPAP every night, all night, she does not wake up with headaches, and feels good during the day.Unchanged   Excessive sweating: she had a hysterectomy in her 30's took HRT until her 9's, was doing well, but had noticed for that over the past 6  months excessive sweating all over her body, not associated with chest  pain or SOB or fatigue, but happens with any form of physical activity never at rest or while sleeping. She states walking the dog, folding clothes, washing dishes, anything that requires movement. She was seen for Dr. Saunders Revel, had a normal Echo and stress test.   Seasonal allergies: she has itchy nose and eyes, scratchy throat. Also clear rhinorrhea. She needs refill of nasal spray   Patient Active Problem List   Diagnosis Date Noted  .  DOE (dyspnea on exertion) 04/15/2018  . Hyperlipidemia 12/09/2017  . Diaphoresis 11/26/2017  . Special screening for malignant neoplasms, colon   . Epiretinal membrane (ERM) of left eye 09/09/2016  . Glaucoma, left eye 09/05/2016  . Abnormal imaging of thyroid 09/15/2015  . Seronegative myasthenia gravis ([place]) 08/08/2015  . Psoriasis 07/06/2015  . Stress incontinence 07/06/2015  . Chronic tension-type headache, intractable 05/29/2015  . Anxiety and depression 04/12/2015  . Asthma, mild intermittent 04/12/2015  . Bladder polyp 04/12/2015  . Carpal tunnel syndrome 04/12/2015  . Binocular vision disorder with diplopia 04/12/2015  . Dyslipidemia 04/12/2015  . H/O: HTN (hypertension) 04/12/2015  . Hemorrhoids, internal 04/12/2015  . Neuropathy 04/12/2015  . Nuclear sclerotic cataract 04/12/2015  . Morbid obesity ([place]) 04/12/2015  . Obstructive apnea 04/12/2015  . Perennial allergic rhinitis 04/12/2015  . 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 Type 2 diabetes, controlled, with neuropathy ([place]) 04/12/2015  . Vitamin D deficiency 04/12/2015  . Cephalalgia 08/16/2014  . Lump or mass in breast 06/14/2013  . Bariatric surgery status 10/12/2012    Past Surgical History:  Procedure Laterality Date  . ABDOMINAL HYSTERECTOMY  1991  . [place] SURGERY  2014  . CATARACT EXTRACTION W/PHACO Left 01/28/2017   Procedure: CATARACT EXTRACTION PHACO AND INTRAOCULAR LENS PLACEMENT (IOC);  Surgeon: Birder Robson, MD;  Location: [facility];  Service: Ophthalmology;  Laterality: Left;  [place] 00:37 AP% 17.8 CDE 6.61 Fluid pack lot # [id number]  . COLONOSCOPY  2012  . COLONOSCOPY WITH PROPOFOL N/A 09/30/2016   Procedure: COLONOSCOPY WITH PROPOFOL;  Surgeon: Lucilla Lame, MD;  Location: [facility];  Service: Endoscopy;  Laterality: N/A;  diabetic - diet controlled sleep apnea  . KNEE SURGERY Right 1979-1980  . PARS PLANA VITRECTOMY Left 03/27/2016   Procedure: PARS PLANA  VITRECTOMY WITH 25 GAUGE AND MEMBRANE PEEL, INTERNAL LIMITING MEMBRANE;  Surgeon: Hurman Horn, MD;  Location: [facility];  Service: Ophthalmology;  Laterality: Left;  . THYROID SURGERY     BX done was benign  . UPPER GI ENDOSCOPY    . VITRECTOMY Left 04/27/2016    Family History  Problem Relation Age of Onset  . Diabetes Mother   . Hypertension Mother   . Heart disease Mother 73       stent x 1   . Muscular dystrophy Sister   . Breast cancer Neg Hx   . Thyroid disease Neg Hx     Social History   Socioeconomic History  . Marital status: Married    Spouse name: Gwyndolyn Saxon "Butch"  . Number of children: 2  . Years of education: Not on file  . Highest education level: Associate degree: occupational, Hotel manager, or vocational program  Occupational History  . Not on file  Social Needs  . Financial resource strain: Not hard at all  . Food insecurity:    Worry: Never true    Inability: Never true  . Transportation needs:    Medical: No    Non-medical: No  Tobacco Use  . Smoking  status: Former Smoker    Years: 3.00    Last attempt to quit: 06/25/1975    Years since quitting: 43.2  . Smokeless tobacco: Never Used  . Tobacco comment: smoked as teenager  Substance and Sexual Activity  . Alcohol use: No    Alcohol/week: 0.0 standard drinks  . Drug use: No  . Sexual activity: Not Currently  Lifestyle  . Physical activity:    Days per week: 7 days    Minutes per session: 30 min  . Stress: Not at all  Relationships  . Social connections:    Talks on phone: More than three times a week    Gets together: More than three times a week    Attends religious service: More than 4 times per year    Active member of club or organization: Yes    Attends meetings of clubs or organizations: 1 to 4 times per year    Relationship status: Married  . Intimate partner violence:    Fear of current or ex partner: No    Emotionally abused: No    Physically abused: No    Forced sexual activity: Not  on file  Other Topics Concern  . Not on file  Social History Narrative  . Not on file     Current Outpatient Medications:  .  acetaminophen (TYLENOL) 500 MG tablet, Take 1,000 mg by mouth every 6 (six) hours as needed for mild pain. Reported on 07/06/2015, Disp: , Rfl:  .  brimonidine (ALPHAGAN) 0.2 % ophthalmic solution, Place 1 drop into the left eye 2 (two) times daily. , Disp: , Rfl: 5 .  budesonide (RHINOCORT AQUA) 32 MCG/ACT nasal spray, Place 2 sprays into both nostrils daily. (Patient taking differently: Place 2 sprays into both nostrils at bedtime. ), Disp: 1 Bottle, Rfl: 5 .  Coenzyme Q10 (CO Q 10) 100 MG CAPS, Take 1 capsule by mouth daily., Disp: 30 capsule, Rfl: 2 .  Dulaglutide (TRULICITY) 1.5 ZJ/6.9CV SOPN, Inject 1.5 mg into the skin once a week., Disp: 4 pen, Rfl: 2 .  DULoxetine (CYMBALTA) 60 MG capsule, Take 1 capsule (60 mg total) by mouth daily., Disp: 90 capsule, Rfl: 1 .  glucose blood (ACCU-CHEK GUIDE) test strip, Use as instructed, Disp: 100 each, Rfl: 12 .  loratadine (CLARITIN) 10 MG tablet, Take 1 tablet (10 mg total) by mouth 2 (two) times daily., Disp: 60 tablet, Rfl: 0 .  montelukast (SINGULAIR) 10 MG tablet, Take 1 tablet (10 mg total) by mouth at bedtime., Disp: 90 tablet, Rfl: 1 .  Multiple Vitamins-Minerals (MULTIVITAMIN WITH MINERALS) tablet, Take 1 tablet by mouth daily., Disp: , Rfl:  .  Omega-3 Fatty Acids (FISH OIL) 1200 MG CAPS, Take 2 capsules by mouth daily., Disp: , Rfl:  .  pyridostigmine (MESTINON) 60 MG tablet, Take 60 mg by mouth 4 (four) times daily. 3 TO 4 TIMES DAILY, Disp: , Rfl: 4 .  rosuvastatin (CRESTOR) 5 MG tablet, Take 1 tablet (5 mg total) by mouth daily., Disp: 90 tablet, Rfl: 1 .  telmisartan-hydrochlorothiazide (MICARDIS HCT) 40-12.5 MG tablet, Take 1 tablet by mouth daily., Disp: 90 tablet, Rfl: 1 .  Travoprost, BAK Free, (TRAVATAN Z) 0.004 % SOLN ophthalmic solution, Place 1 drop into the left eye at bedtime. , Disp: , Rfl:  .   VENTOLIN HFA 108 (90 Base) MCG/ACT inhaler, INHALE 2 PUFFS BY MOUTH EVERY 4 HOURS AS NEEDED SHORTNESS OF BREATH/WHEEZING, Disp: 18 g, Rfl: 0  Allergies  Allergen Reactions  .  Aspirin Swelling    Upset stomach  Upset stomach   . Azithromycin     Contraindicated in Myasthenia   . Ace Inhibitors     cough  . Amlodipine     Edema of feet and hands  . Gabapentin     anxiety  . Pregabalin Other (See Comments)    anxiety    I personally reviewed active problem list, medication list, allergies, family history, social history with the patient/caregiver today.   ROS  Constitutional: Negative for fever or significant weight change.  Respiratory: positive  for intermittent dry cough and shortness of breath.   Cardiovascular: Negative for chest pain or palpitations.  Gastrointestinal: Negative for abdominal pain, no bowel changes.  Musculoskeletal: Negative for gait problem or joint swelling.  Skin: Negative for rash.  Neurological: Negative for dizziness , positive for intermittent  headache.  No other specific complaints in a complete review of systems (except as listed in HPI above).  Objective  Virtual encounter, vitals not obtained.  Body mass index is 43.53 kg/m.  Physical Exam  Awake, alert and in no distress  PHQ2/9: Depression screen [facility] 2/9 09/22/2018 07/31/2018 02/20/2018 10/17/2017 06/11/2017  Decreased Interest 0 0 0 0 0  Down, Depressed, Hopeless 0 0 0 0 0  PHQ - 2 Score 0 0 0 0 0  Altered sleeping 1 - 0 - -  Tired, decreased energy 0 - 0 - -  Change in appetite 0 - 0 - -  Feeling bad or failure about yourself  0 - 0 - -  Trouble concentrating 1 - 0 - -  Moving slowly or fidgety/restless 0 - 0 - -  Suicidal thoughts 0 - 0 - -  PHQ-9 Score 2 - 0 - -  Difficult doing work/chores Not difficult at all - - - -   PHQ-2/9 Result is negative.    Fall Risk: Fall Risk  09/22/2018 07/31/2018 05/25/2018 02/20/2018 10/17/2017  Falls in the past year? 0 0 0 No No  Number falls  in past yr: 0 0 0 - -  Injury with Fall? 0 0 0 - -     Assessment & Plan   1. SOB (shortness of breath)  - Ambulatory referral to Pulmonology  2. Mild intermittent asthma with acute exacerbation  - Ambulatory referral to Pulmonology  3. Type 2 diabetes, controlled, with neuropathy (H[facility])  - Dulaglutide (TRULICITY) 1.5 LP/3.7TK SOPN; Inject 1.5 mg into the skin once a week.  Dispense: 4 pen; Refill: 2  4. Peripheral polyneuropathy  stable  5. Seronegative myasthenia gravis ([place])  Taking medication   6. Dyslipidemia associated with type 2 diabetes mellitus ([place])   7. Obstructive apnea  Compliant   8. Hypertension, benign  She was not here today to get bp checked   9. Morbid obesity, unspecified obesity type ([place])  I discussed the assessment and treatment plan with the patient. The patient was provided an opportunity to ask questions and all were answered. The patient agreed with the plan and demonstrated an understanding of the instructions.  The patient was advised to call back or seek an in-person evaluation if the symptoms worsen or if the condition fails to improve as anticipated.  I provided 25 minutes of non-face-to-face time during this encounter.

## 2018-10-02 MED FILL — TRULICITY 1.5 MG/0.5 ML PEN: 1.5 | 28 days supply | Qty: 2 | Fill #0

## 2018-10-13 ENCOUNTER — Telehealth: Payer: Self-pay | Admitting: General Practice

## 2018-10-13 NOTE — Telephone Encounter (Signed)
Pt has been scheduled for consult on 10/21/18 at 3:00 with DR.   Called patient for COVID-19 screening.  Have you recently traveled any where out of the local area in the last 2 weeks? no  Have you been in close contact with a person diagnosed with COVID-19 within the last 2 weeks?no  Do you currently have any fever, cough, or shortness of breath? Sob-baseline   Okay to proceed with visit.

## 2018-10-21 ENCOUNTER — Other Ambulatory Visit: Payer: Self-pay

## 2018-10-21 ENCOUNTER — Ambulatory Visit: Payer: 59 | Admitting: Internal Medicine

## 2018-10-21 ENCOUNTER — Encounter: Payer: Self-pay | Admitting: Internal Medicine

## 2018-10-21 VITALS — BP 148/82 | HR 70 | Ht 62.0 in | Wt 234.8 lb

## 2018-10-21 DIAGNOSIS — J4540 Moderate persistent asthma, uncomplicated: Secondary | ICD-10-CM | POA: Diagnosis not present

## 2018-10-21 MED ORDER — FLUTICASONE FUROATE-VILANTEROL 200-25 MCG/INH IN AEPB: 1.0000 | INHALATION_SPRAY | Freq: Every day | RESPIRATORY_TRACT | 5 refills | Status: DC

## 2018-10-21 NOTE — Patient Instructions (Signed)
Will start Breo daily, rinse mouth after use.  Will check blood work, lung function test.

## 2018-10-21 NOTE — Progress Notes (Signed)
[facility] Consultation      Assessment and Plan:  Allergic asthma with dyspnea on exertion. - Suspect that her dyspnea is multifactorial from asthma, obesity, deconditioning. - We will step up her therapy start her on Breo once daily, she is given a coupon for this.  Continue albuterol when needed. - Continue Claritin, Singulair.  Recommended that she keep her dog outside of the bedroom. - Check CBC with differential.  History of myasthenia gravis. - This is not typically affected her breathing in the past, will check a pulmonary function test.  Obstructive sleep apnea. - Currently using CPAP every night, current machine is about 64 years old.  Symptoms appear to be well controlled. - Continue using CPAP every night.  Orders Placed This Encounter  Procedures  . CBC with Differential/Platelet  . Pulmonary Function Test [facility] Only   Meds ordered this encounter  Medications  . fluticasone furoate-vilanterol (BREO ELLIPTA) 200-25 MCG/INH AEPB    Sig: Inhale 1 puff into the lungs daily. Rinse mouth after use.    Dispense:  1 each    Refill:  5    Return in about 6 weeks (around 12/02/2018).   Date: 10/21/2018  MRN# [id number] Jasmine Woods [id number]   Jasmine Woods is a 63 y.o. old female seen in consultation for chief complaint of:    Chief Complaint  Patient presents with  . pulmonary consult    per [name]- hx of asthma. pt reports of sob with exertion, occ wheezing & nasal/head congestion x43mo    HPI:  The patient is a 63 year old female, history of diabetes mellitus and bariatric surgery in 2014, asthma, myasthenia gravis followed at [facility].  Her symptoms are ptosis double vision, treated with Mestinon.  She has a history of obstructive sleep apnea on CPAP.  She has been having shortness of breathing for several months, she has a history of asthma but did not bother her until recently. Usually triggered by allergies or scents  or respiratory infection. But lately she has been having sweats, she was seen by cardiology, had a stress and echo. She had a lot of cough immediately after and had to use her inhaler.  She gets winded with stairs or exerting herself.  She has a dog, in bed with her.  She denies reflux, does have sinus drainage, takes claritin, singulair and rhinocort. She think that singulair helps with dyspnea. She has taken advair in the past and helped several years ago when she had a URTI, but stopped since then.   She has had allergy testing remotely, it was a blood test, not sure of the results.   She has OSA, she is using CPAP every night, she has been on it for early 90's. Currently machine is about 64 yrs old, she is using a nasal mask.    Echo 07/28/18>> EF 60%, RVSP 30.  CBC 10/17/2017>> absolute eosinophil count 120. CT chest 02/03/2017>> imaging personally reviewed, lung volumes are slightly reduced from obesity, lungs are otherwise unremarkable.  PMHX:   Past Medical History:  Diagnosis Date  . Allergy   . Anxiety   . Asthma   . Bladder polyp    Dr. Ernst Spell  . Carpal tunnel syndrome   . Depression   . Diabetes mellitus without complication ([place])    Diet controlled  . Double vision    seen by Dr. 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 Dyslipidemia   . Frequency of urination   . Glaucoma,  left eye   . H/O myasthenia gravis    MOSTLY AFFECTS EYES  . HA (headache)   . Heart murmur    followed by PCP  . Hx of bariatric surgery   . Hypertension    no meds  . Internal hemorrhoids   . Myasthenia gravis ([place])    "mostly effects eyes"  . Neuropathy, generalized    diabetic on feet  . Nuclear sclerotic cataract of both eyes   . Obesity (BMI 30-39.9)   . Obstructive sleep apnea on CPAP    uses cpap  . Patient is Jehovah's Witness    no blood products  . Rotator cuff tendonitis 07/2009   also has impingment right shoulder pain, seen by Dr. Mack Guise  . Sleep apnea   . Thyroid  nodule   . Vaginal dryness, menopausal   . Vitamin D deficiency    Surgical Hx:  Past Surgical History:  Procedure Laterality Date  . ABDOMINAL HYSTERECTOMY  1991  . [place] SURGERY  2014  . CATARACT EXTRACTION W/PHACO Left 01/28/2017   Procedure: CATARACT EXTRACTION PHACO AND INTRAOCULAR LENS PLACEMENT (IOC);  Surgeon: Birder Robson, MD;  Location: [facility];  Service: Ophthalmology;  Laterality: Left;  [place] 00:37 AP% 17.8 CDE 6.61 Fluid pack lot # [id number]  . COLONOSCOPY  2012  . COLONOSCOPY WITH PROPOFOL N/A 09/30/2016   Procedure: COLONOSCOPY WITH PROPOFOL;  Surgeon: Lucilla Lame, MD;  Location: [facility];  Service: Endoscopy;  Laterality: N/A;  diabetic - diet controlled sleep apnea  . KNEE SURGERY Right 1979-1980  . PARS PLANA VITRECTOMY Left 03/27/2016   Procedure: PARS PLANA VITRECTOMY WITH 25 GAUGE AND MEMBRANE PEEL, INTERNAL LIMITING MEMBRANE;  Surgeon: Hurman Horn, MD;  Location: [facility];  Service: Ophthalmology;  Laterality: Left;  . THYROID SURGERY     BX done was benign  . UPPER GI ENDOSCOPY    . VITRECTOMY Left 04/27/2016   Family Hx:  Family History  Problem Relation Age of Onset  . Diabetes Mother   . Hypertension Mother   . Heart disease Mother 40       stent x 1   . Muscular dystrophy Sister   . Breast cancer Neg Hx   . Thyroid disease Neg Hx    Social Hx:   Social History   Tobacco Use  . Smoking status: Former Smoker    Years: 3.00    Last attempt to quit: 06/25/1975    Years since quitting: 43.3  . Smokeless tobacco: Never Used  . Tobacco comment: smoked as teenager  Substance Use Topics  . Alcohol use: No    Alcohol/week: 0.0 standard drinks  . Drug use: No   Medication:    Current Outpatient Medications:  .  acetaminophen (TYLENOL) 500 MG tablet, Take 1,000 mg by mouth every 6 (six) hours as needed for mild pain. Reported on 07/06/2015, Disp: , Rfl:  .  brimonidine (ALPHAGAN) 0.2 % ophthalmic solution, Place 1 drop into the left eye  2 (two) times daily. , Disp: , Rfl: 5 .  budesonide (RHINOCORT AQUA) 32 MCG/ACT nasal spray, Place 2 sprays into both nostrils at bedtime., Disp: 15 mL, Rfl: 2 .  Coenzyme Q10 (CO Q 10) 100 MG CAPS, Take 1 capsule by mouth daily., Disp: 30 capsule, Rfl: 2 .  Dulaglutide (TRULICITY) 1.5 OX/7.3ZH SOPN, Inject 1.5 mg into the skin once a week., Disp: 4 pen, Rfl: 2 .  DULoxetine (CYMBALTA) 60 MG capsule, Take 1 capsule (60 mg total) by  mouth daily., Disp: 90 capsule, Rfl: 1 .  glucose blood (ACCU-CHEK GUIDE) test strip, Use as instructed, Disp: 100 each, Rfl: 12 .  loratadine (CLARITIN) 10 MG tablet, Take 1 tablet (10 mg total) by mouth 2 (two) times daily., Disp: 60 tablet, Rfl: 0 .  montelukast (SINGULAIR) 10 MG tablet, Take 1 tablet (10 mg total) by mouth at bedtime., Disp: 90 tablet, Rfl: 1 .  Multiple Vitamins-Minerals (MULTIVITAMIN WITH MINERALS) tablet, Take 1 tablet by mouth daily., Disp: , Rfl:  .  Omega-3 Fatty Acids (FISH OIL) 1200 MG CAPS, Take 2 capsules by mouth daily., Disp: , Rfl:  .  pyridostigmine (MESTINON) 60 MG tablet, Take 60 mg by mouth 4 (four) times daily. 3 TO 4 TIMES DAILY, Disp: , Rfl: 4 .  rosuvastatin (CRESTOR) 5 MG tablet, Take 1 tablet (5 mg total) by mouth daily., Disp: 90 tablet, Rfl: 1 .  telmisartan-hydrochlorothiazide (MICARDIS HCT) 40-12.5 MG tablet, Take 1 tablet by mouth daily., Disp: 90 tablet, Rfl: 1 .  Travoprost, BAK Free, (TRAVATAN Z) 0.004 % SOLN ophthalmic solution, Place 1 drop into the left eye at bedtime. , Disp: , Rfl:  .  VENTOLIN HFA 108 (90 Base) MCG/ACT inhaler, INHALE 2 PUFFS BY MOUTH EVERY 4 HOURS AS NEEDED SHORTNESS OF BREATH/WHEEZING, Disp: 18 g, Rfl: 0   Allergies:  Aspirin; Azithromycin; Ace inhibitors; Amlodipine; Gabapentin; and Pregabalin  Review of Systems: Gen:  Denies  fever, sweats, chills HEENT: Denies blurred vision, double vision. bleeds, sore throat Cvc:  No dizziness, chest pain. Resp:   Denies cough or sputum production,  shortness of breath Gi: Denies swallowing difficulty, stomach pain. Gu:  Denies bladder incontinence, burning urine Ext:   No Joint pain, stiffness. Skin: No skin rash,  hives  Endoc:  No polyuria, polydipsia. Psych: No depression, insomnia. Other:  All other systems were reviewed with the patient and were negative other that what is mentioned in the HPI.   Physical Examination:   VS: BP (!) 148/82 (BP Location: Left Arm, Cuff Size: Normal)   Pulse 70   Ht 5\' 2"  (1.575 m)   Wt 234 lb 12.8 oz (106.5 kg)   SpO2 97%   BMI 42.95 kg/m   General Appearance: No distress  Neuro:without focal findings,  speech normal,  HEENT: PERRLA, EOM intact.  Mallampati 3 Pulmonary: normal breath sounds, No wheezing.  CardiovascularNormal S1,S2.  No m/r/g.   Abdomen: Benign, Soft, non-tender. Renal:  No costovertebral tenderness  GU:  No performed at this time. Endoc: No evident thyromegaly, no signs of acromegaly. Skin:   warm, no rashes, no ecchymosis  Extremities: normal, no cyanosis, clubbing.  Other findings:    LABORATORY PANEL:   CBC No results for input(s): WBC, HGB, HCT, PLT in the last 168 hours. ------------------------------------------------------------------------------------------------------------------  Chemistries  No results for input(s): NA, K, CL, CO2, GLUCOSE, BUN, CREATININE, CALCIUM, MG, AST, ALT, ALKPHOS, BILITOT in the last 168 hours.  Invalid input(s): GFRCGP ------------------------------------------------------------------------------------------------------------------  Cardiac Enzymes No results for input(s): TROPONINI in the last 168 hours. ------------------------------------------------------------  RADIOLOGY:  No results found.     Thank  you for the consultation and for allowing [facility] to assist in the care of your patient. Our recommendations are noted above.  Please contact us if we can be of further service.    Marda Stalker, M.D., F.C.C.P.  Board Certified in Internal Medicine, Pulmonary Medicine, [place], and Sleep Medicine.  [facility] Office Number: [phone]  10/21/2018 

## 2018-10-29 ENCOUNTER — Other Ambulatory Visit
Admission: RE | Admit: 2018-10-29 | Discharge: 2018-10-29 | Disposition: A | Discharge status: Home / Self Care | Payer: 59 | Attending: Internal Medicine | Admitting: Internal Medicine

## 2018-10-29 ENCOUNTER — Other Ambulatory Visit: Payer: Self-pay | Admitting: Internal Medicine

## 2018-10-29 ENCOUNTER — Telehealth: Payer: Self-pay | Admitting: *Deleted

## 2018-10-29 DIAGNOSIS — J4540 Moderate persistent asthma, uncomplicated: Secondary | ICD-10-CM

## 2018-10-29 LAB — CBC WITH DIFFERENTIAL/PLATELET
Abs Immature Granulocytes: 0.03 10*3/uL (ref 0.00–0.07)
Basophils Absolute: 0.1 10*3/uL (ref 0.0–0.1)
Basophils Relative: 1 %
Eosinophils Absolute: 0.1 10*3/uL (ref 0.0–0.5)
Eosinophils Relative: 1 %
HCT: 39.7 % (ref 36.0–46.0)
Hemoglobin: 13.6 g/dL (ref 12.0–15.0)
Immature Granulocytes: 0 %
Lymphocytes Relative: 35 %
Lymphs Abs: 3.7 10*3/uL (ref 0.7–4.0)
MCH: 30.2 pg (ref 26.0–34.0)
MCHC: 34.3 g/dL (ref 30.0–36.0)
MCV: 88 fL (ref 80.0–100.0)
Monocytes Absolute: 0.7 10*3/uL (ref 0.1–1.0)
Monocytes Relative: 6 %
Neutro Abs: 6 10*3/uL (ref 1.7–7.7)
Neutrophils Relative %: 57 %
Platelets: 295 10*3/uL (ref 150–400)
RBC: 4.51 MIL/uL (ref 3.87–5.11)
RDW: 12.6 % (ref 11.5–15.5)
WBC: 10.5 10*3/uL (ref 4.0–10.5)
nRBC: 0 % (ref 0.0–0.2)

## 2018-10-29 NOTE — Telephone Encounter (Signed)
Patient has appointment coming up on 5/20. Need to switch to virtual. No answer. Left message that I would send MyChart message with consent and information but to please call us back if she has further questions.

## 2018-10-29 NOTE — Addendum Note (Signed)
Addended by: Milbert Coulter on: 10/29/2018 02:36 PM   Modules accepted: Orders

## 2018-10-29 NOTE — Addendum Note (Signed)
Addended by: Milbert Coulter on: 10/29/2018 02:35 PM   Modules accepted: Orders

## 2018-10-31 LAB — IGE: IgE (Immunoglobulin E), Serum: 18 IU/mL (ref 6–495)

## 2018-11-03 ENCOUNTER — Telehealth: Payer: Self-pay | Admitting: Internal Medicine

## 2018-11-03 NOTE — Telephone Encounter (Signed)
Virtual Visit Pre-Appointment Phone Call  "(Name), I am calling you today to discuss your upcoming appointment. We are currently trying to limit exposure to the virus that causes COVID-19 by seeing patients at home rather than in the office."  1. "What is the BEST phone number to call the day of the visit?" - include this in appointment notes  2. Do you have or have access to (through a family member/friend) a smartphone with video capability that we can use for your visit?" a. If yes - list this number in appt notes as cell (if different from BEST phone #) and list the appointment type as a VIDEO visit in appointment notes b. If no - list the appointment type as a PHONE visit in appointment notes  3. Confirm consent - "In the setting of the current Covid19 crisis, you are scheduled for a (phone or video) visit with your provider on (date) at (time).  Just as we do with many in-office visits, in order for you to participate in this visit, we must obtain consent.  If you'd like, I can send this to your mychart (if signed up) or email for you to review.  Otherwise, I can obtain your verbal consent now.  All virtual visits are billed to your insurance company just like a normal visit would be.  By agreeing to a virtual visit, we'd like you to understand that the technology does not allow for your provider to perform an examination, and thus may limit your provider's ability to fully assess your condition. If your provider identifies any concerns that need to be evaluated in person, we will make arrangements to do so.  Finally, though the technology is pretty good, we cannot assure that it will always work on either your or our end, and in the setting of a video visit, we may have to convert it to a phone-only visit.  In either situation, we cannot ensure that we have a secure connection.  Are you willing to proceed?" STAFF: Did the patient verbally acknowledge consent to telehealth visit? Document  YES/NO here: YES  4. Advise patient to be prepared - "Two hours prior to your appointment, go ahead and check your blood pressure, pulse, oxygen saturation, and your weight (if you have the equipment to check those) and write them all down. When your visit starts, your provider will ask you for this information. If you have an Apple Watch or Kardia device, please plan to have heart rate information ready on the day of your appointment. Please have a pen and paper handy nearby the day of the visit as well."  5. Give patient instructions for MyChart download to smartphone OR Doximity/Doxy.me as below if video visit (depending on what platform provider is using)  6. Inform patient they will receive a phone call 15 minutes prior to their appointment time (may be from unknown caller ID) so they should be prepared to answer    TELEPHONE CALL NOTE  Jasmine Woods has been deemed a candidate for a follow-up tele-health visit to limit community exposure during the Covid-19 pandemic. I spoke with the patient via phone to ensure availability of phone/video source, confirm preferred email & phone number, and discuss instructions and expectations.  I reminded Jasmine Woods to be prepared with any vital sign and/or heart rhythm information that could potentially be obtained via home monitoring, at the time of her visit. I reminded Jasmine Woods to expect a phone call prior to  her visit.  Jasmine Woods 11/03/2018 3:15 PM   INSTRUCTIONS FOR DOWNLOADING THE MYCHART APP TO SMARTPHONE  - The patient must first make sure to have activated MyChart and know their login information - If Apple, go to [employer] and type in MyChart in the search bar and download the app. If Android, ask patient to go to [employer] and type in [place] in the search bar and download the app. The app is free but as with any other app downloads, their phone may require them to verify saved payment  information or Apple/Android password.  - The patient will need to then log into the app with their MyChart username and password, and select [facility] as their healthcare provider to link the account. When it is time for your visit, go to the MyChart app, find appointments, and click Begin Video Visit. Be sure to Select Allow for your device to access the Microphone and Camera for your visit. You will then be connected, and your provider will be with you shortly.  **If they have any issues connecting, or need assistance please contact MyChart service desk [phone])**  **If using a computer, in order to ensure the best quality for their visit they will need to use either of the following Internet Browsers: [employer], or Google Chrome**  IF USING DOXIMITY or DOXY.ME - The patient will receive a link just prior to their visit by text.     FULL LENGTH CONSENT FOR TELE-HEALTH VISIT   I hereby voluntarily request, consent and authorize [facility] and its employed or contracted physicians, physician assistants, nurse practitioners or other licensed health care professionals (the Practitioner), to provide me with telemedicine health care services (the Services") as deemed necessary by the treating Practitioner. I acknowledge and consent to receive the Services by the Practitioner via telemedicine. I understand that the telemedicine visit will involve communicating with the Practitioner through live audiovisual communication technology and the disclosure of certain medical information by electronic transmission. I acknowledge that I have been given the opportunity to request an in-person assessment or other available alternative prior to the telemedicine visit and am voluntarily participating in the telemedicine visit.  I understand that I have the right to withhold or withdraw my consent to the use of telemedicine in the course of my care at any time, without affecting my right  to future care or treatment, and that the Practitioner or I may terminate the telemedicine visit at any time. I understand that I have the right to inspect all information obtained and/or recorded in the course of the telemedicine visit and may receive copies of available information for a reasonable fee.  I understand that some of the potential risks of receiving the Services via telemedicine include:   Delay or interruption in medical evaluation due to technological equipment failure or disruption;  Information transmitted may not be sufficient (e.g. poor resolution of images) to allow for appropriate medical decision making by the Practitioner; and/or   In rare instances, security protocols could fail, causing a breach of personal health information.  Furthermore, I acknowledge that it is my responsibility to provide information about my medical history, conditions and care that is complete and accurate to the best of my ability. I acknowledge that Practitioner's advice, recommendations, and/or decision may be based on factors not within their control, such as incomplete or inaccurate data provided by me or distortions of diagnostic images or specimens that may result from electronic transmissions. I understand  that the practice of medicine is not an exact science and that Practitioner makes no warranties or guarantees regarding treatment outcomes. I acknowledge that I will receive a copy of this consent concurrently upon execution via email to the email address I last provided but may also request a printed copy by calling the office of [facility].    I understand that my insurance will be billed for this visit.   I have read or had this consent read to me.  I understand the contents of this consent, which adequately explains the benefits and risks of the Services being provided via telemedicine.   I have been provided ample opportunity to ask questions regarding this consent and the Services  and have had my questions answered to my satisfaction.  I give my informed consent for the services to be provided through the use of telemedicine in my medical care  By participating in this telemedicine visit I agree to the above.

## 2018-11-05 ENCOUNTER — Other Ambulatory Visit: Payer: Self-pay | Admitting: Family Medicine

## 2018-11-05 DIAGNOSIS — I10 Essential (primary) hypertension: Secondary | ICD-10-CM

## 2018-11-05 MED FILL — DULOXETINE HCL 60 MG CPEP: 60 | 90 days supply | Qty: 90 | Fill #0

## 2018-11-06 NOTE — Telephone Encounter (Signed)
Refill request for Hypertension medication:  Telmisartan-HCTZ   Last office visit pertaining to hypertension: 09/22/2018  BP Readings from Last 3 Encounters:  10/21/18 (!) 148/82  09/22/18 130/77  08/19/18 134/90     Lab Results  Component Value Date   CREATININE 0.65 03/05/2018   BUN 15 03/05/2018   NA 142 03/05/2018   K 4.3 03/05/2018   CL 100 03/05/2018   CO2 19 (L) 03/05/2018    Follow-ups on file. 01/18/2019

## 2018-11-11 ENCOUNTER — Other Ambulatory Visit: Payer: Self-pay

## 2018-11-11 ENCOUNTER — Encounter: Payer: Self-pay | Admitting: Internal Medicine

## 2018-11-11 ENCOUNTER — Telehealth (INDEPENDENT_AMBULATORY_CARE_PROVIDER_SITE_OTHER): Payer: 59 | Admitting: Internal Medicine

## 2018-11-11 VITALS — BP 137/72 | HR 62 | Ht 62.0 in | Wt 236.0 lb

## 2018-11-11 DIAGNOSIS — I10 Essential (primary) hypertension: Secondary | ICD-10-CM

## 2018-11-11 DIAGNOSIS — Z79899 Other long term (current) drug therapy: Secondary | ICD-10-CM

## 2018-11-11 DIAGNOSIS — R0609 Other forms of dyspnea: Secondary | ICD-10-CM | POA: Diagnosis not present

## 2018-11-11 DIAGNOSIS — H401122 Primary open-angle glaucoma, left eye, moderate stage: Secondary | ICD-10-CM | POA: Diagnosis not present

## 2018-11-11 DIAGNOSIS — R61 Generalized hyperhidrosis: Secondary | ICD-10-CM | POA: Diagnosis not present

## 2018-11-11 LAB — HM DIABETES EYE EXAM

## 2018-11-11 NOTE — Patient Instructions (Signed)
Medication Instructions:  Your physician recommends that you continue on your current medications as directed. Please refer to the Current Medication list given to you today.  After we get the lab results, we will plan to make adjustments with your medication.  If you need a refill on your cardiac medications before your next appointment, please call your pharmacy.   Lab work: Your physician recommends that you return for lab work in: the next few days (BMP). You may have at [facility].   Your physician recommends that you return for lab work in: BMP in 2 weeks.    If you have labs (blood work) drawn today and your tests are completely normal, you will receive your results only by: Marland Kitchen MyChart Message (if you have MyChart) OR . A paper copy in the mail If you have any lab test that is abnormal or we need to change your treatment, we will call you to review the results.  Testing/Procedures: none  Follow-Up: At [facility], you and your health needs are our priority.  As part of our continuing mission to provide you with exceptional heart care, we have created designated Provider Care Teams.  These Care Teams include your primary Cardiologist (physician) and Advanced Practice Providers (APPs -  Physician Assistants and Nurse Practitioners) who all work together to provide you with the care you need, when you need it. You will need a follow up appointment in 4 months.  Please call our office 2 months in advance to schedule this appointment.  You may see Nelva Bush, MD or one of the following Advanced Practice Providers on your designated Care Team:   Murray Hodgkins, NP Christell Faith, PA-C . Marrianne Mood, PA-C

## 2018-11-11 NOTE — Progress Notes (Signed)
Virtual Visit via Video Note   This visit type was conducted due to national recommendations for restrictions regarding the COVID-19 Pandemic (e.g. social distancing) in an effort to limit this patient's exposure and mitigate transmission in our community.  Due to her co-morbid illnesses, this patient is at least at moderate risk for complications without adequate follow up.  This format is felt to be most appropriate for this patient at this time.  All issues noted in this document were discussed and addressed.  A limited physical exam was performed with this format.  Please refer to the patient's chart for her consent to telehealth for [facility].   Date:  7/42/5956   ID:  Jasmine Woods, DOB 1955/12/05, MRN [id number]  Patient Location: Home Provider Location: Office  PCP:  Steele Sizer, MD  Cardiologist:  Nelva Bush, MD  Electrophysiologist:  None   Evaluation Performed:  Follow-Up Visit  Chief Complaint: Follow-up shortness of breath and diaphoresis  History of Present Illness:    Jasmine Woods is a 63 y.o. female with history of hypertension, hyperlipidemia, morbid obesity status post bariatric surgery, myasthenia gravis, and obstructive sleep apnea.  We are speaking today for follow-up of exertional dyspnea and diaphoresis.  I last saw Jasmine Woods in January, at which time she reported feeling about the same with continued dyspnea during exercise.  Given the low to intermediate risk exercise tolerance test (degraded by motion artifact and poor functional capacity) we discussed further ischemia evaluation but agreed to defer this in favor of weight loss and exercise.  Echocardiogram was performed, which revealed normal left and right ventricular function and no significant valvular abnormalities.  Today, Jasmine Woods reports that her breathing has improved with addition of Breo inhaler prescribed by Dr. Ashby Dawes in late April.  She still has  some exertional dyspnea as well as diaphoresis.  She walks with her dog but does not exercise regularly.  She has not lost any weight.  She denies chest pain, palpitations, lightheadedness, orthopnea, PND, and edema.  Blood pressures typically around today's reading, though systolic reading sometimes approach 150 mmHg.  Jasmine Woods adds salt to her food from time to time.  She has been compliant with her medications.  The patient does not have symptoms concerning for COVID-19 infection (fever, chills, cough, or new shortness of breath).    Past Medical History:  Diagnosis Date   Allergy    Anxiety    Asthma    Bladder polyp    Dr. Ernst Spell   Carpal tunnel syndrome    Depression    Diabetes mellitus without complication ([place])    Diet controlled   Double vision    seen by Dr. 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 myastenia gravis   Dyslipidemia    Frequency of urination    Glaucoma, left eye    H/O myasthenia gravis    MOSTLY AFFECTS EYES   HA (headache)    Heart murmur    followed by PCP   Hx of bariatric surgery    Hypertension    no meds   Internal hemorrhoids    Myasthenia gravis ([place])    "mostly effects eyes"   Neuropathy, generalized    diabetic on feet   Nuclear sclerotic cataract of both eyes    Obesity (BMI 30-39.9)    Obstructive sleep apnea on CPAP    uses cpap   Patient is Jehovah's Witness    no blood products   Rotator cuff tendonitis 07/2009   also has  impingment right shoulder pain, seen by Dr. Mack Guise   Sleep apnea    Thyroid nodule    Vaginal dryness, menopausal    Vitamin D deficiency    Past Surgical History:  Procedure Laterality Date   ABDOMINAL HYSTERECTOMY  1991   BARIATRIC SURGERY  2014   CATARACT EXTRACTION W/PHACO Left 01/28/2017   Procedure: CATARACT EXTRACTION PHACO AND INTRAOCULAR LENS PLACEMENT ([place]);  Surgeon: Birder Robson, MD;  Location: [facility];  Service: Ophthalmology;  Laterality: Left;  [place] 00:37 AP%  17.8 CDE 6.61 Fluid pack lot # [id number]   COLONOSCOPY  2012   COLONOSCOPY WITH PROPOFOL N/A 09/30/2016   Procedure: COLONOSCOPY WITH PROPOFOL;  Surgeon: Lucilla Lame, MD;  Location: [place];  Service: Endoscopy;  Laterality: N/A;  diabetic - diet controlled sleep apnea   KNEE SURGERY Right 1979-1980   PARS PLANA VITRECTOMY Left 03/27/2016   Procedure: PARS PLANA VITRECTOMY WITH 25 GAUGE AND MEMBRANE PEEL, INTERNAL LIMITING MEMBRANE;  Surgeon: Hurman Horn, MD;  Location: [facility];  Service: Ophthalmology;  Laterality: Left;   THYROID SURGERY     BX done was benign   UPPER GI ENDOSCOPY     VITRECTOMY Left 04/27/2016     Current Meds  Medication Sig   acetaminophen (TYLENOL) 500 MG tablet Take 1,000 mg by mouth every 6 (six) hours as needed for mild pain. Reported on 07/06/2015   brimonidine (ALPHAGAN) 0.2 % ophthalmic solution Place 1 drop into the left eye 2 (two) times daily.    budesonide (RHINOCORT AQUA) 32 MCG/ACT nasal spray Place 2 sprays into both nostrils at bedtime.   Coenzyme Q10 (CO Q 10) 100 MG CAPS Take 1 capsule by mouth daily.   Dulaglutide (TRULICITY) 1.5 GE/9.5MW SOPN Inject 1.5 mg into the skin once a week.   DULoxetine (CYMBALTA) 60 MG capsule Take 1 capsule (60 mg total) by mouth daily.   fluticasone furoate-vilanterol (BREO ELLIPTA) 200-25 MCG/INH AEPB Inhale 1 puff into the lungs daily. Rinse mouth after use.   glucose blood (ACCU-CHEK GUIDE) test strip Use as instructed   loratadine (CLARITIN) 10 MG tablet Take 1 tablet (10 mg total) by mouth 2 (two) times daily. (Patient taking differently: Take 10 mg by mouth daily. )   montelukast (SINGULAIR) 10 MG tablet Take 1 tablet (10 mg total) by mouth at bedtime.   Multiple Vitamins-Minerals (MULTIVITAMIN WITH MINERALS) tablet Take 1 tablet by mouth daily.   Omega-3 Fatty Acids (FISH OIL) 1200 MG CAPS Take 2 capsules by mouth daily.   pyridostigmine (MESTINON) 60 MG tablet Take 60 mg by mouth 4  (four) times daily. 3 TO 4 TIMES DAILY   rosuvastatin (CRESTOR) 5 MG tablet Take 1 tablet (5 mg total) by mouth daily.   telmisartan-hydrochlorothiazide (MICARDIS HCT) 40-12.5 MG tablet TAKE 1 TABLET BY MOUTH DAILY.   Travoprost, BAK Free, (TRAVATAN Z) 0.004 % SOLN ophthalmic solution Place 1 drop into the left eye at bedtime.    VENTOLIN HFA 108 (90 Base) MCG/ACT inhaler INHALE 2 PUFFS BY MOUTH EVERY 4 HOURS AS NEEDED SHORTNESS OF BREATH/WHEEZING     Allergies:   Aspirin; Azithromycin; Ace inhibitors; Amlodipine; Gabapentin; and Pregabalin   Social History   Tobacco Use   Smoking status: Former Smoker    Packs/day: 0.50    Years: 3.00    Pack years: 1.50    Last attempt to quit: 06/25/1975    Years since quitting: 43.4   Smokeless tobacco: Never Used   Tobacco comment: smoked as teenager  Substance Use Topics   Alcohol use: No    Alcohol/week: 0.0 standard drinks   Drug use: No     Family Hx: The patient's family history includes Diabetes in her mother; Heart disease (age of onset: 61) in her mother; Hypertension in her mother; Muscular dystrophy in her sister. There is no history of Breast cancer or Thyroid disease.  ROS:   Please see the history of present illness.   All other systems reviewed and are negative.   Prior CV studies:   The following studies were reviewed today:   Echo (07/28/2018): Borderline dilated left ventricle with borderline LVH.  LVEF 60-65% with normal wall motion and diastolic function.  Normal RV size and function.  Mild thickening of mitral valve noted.  Otherwise, no significant valvular abnormality.  (06/12/18): Low to intermediate risk study limited by motion artifact and reduced exercise capacity.  No significant ST/T changes noted in early recovery.  Hypertensive BP response noted.  CTA chest (02/03/17 - personally reviewed): No significant coronary artery or aortic calcification. No PE. Lungs clear.  Echo (~15 years ago): No report  available, though patient states that the study was normal.  Labs/Other Tests and Data Reviewed:    EKG:  No ECG reviewed.  Recent Labs: 03/05/2018: ALT 13; BUN 15; Creatinine, Ser 0.65; Potassium 4.3; Sodium 142; TSH 0.826 10/29/2018: Hemoglobin 13.6; Platelets 295   Recent Lipid Panel Lab Results  Component Value Date/Time   CHOL 175 03/05/2018 08:07 AM   CHOL 153 05/08/2012 09:35 AM   TRIG 80 03/05/2018 08:07 AM   TRIG 152 05/08/2012 09:35 AM   HDL 61 03/05/2018 08:07 AM   HDL 46 05/08/2012 09:35 AM   CHOLHDL 3.7 10/17/2017 02:39 PM   LDLCALC 98 03/05/2018 08:07 AM   LDLCALC 149 (H) 10/17/2017 02:39 PM   LDLCALC 77 05/08/2012 09:35 AM    Wt Readings from Last 3 Encounters:  11/11/18 236 lb (107 kg)  10/21/18 234 lb 12.8 oz (106.5 kg)  09/22/18 238 lb (108 kg)     Objective:    Vital Signs:  BP 137/72 (BP Location: Left Arm, Patient Position: Sitting, Cuff Size: Normal)    Pulse 62    Ht 5\' 2"  (1.575 m)    Wt 236 lb (107 kg)    BMI 43.16 kg/m    VITAL SIGNS:  reviewed GEN:  no acute distress  ASSESSMENT & PLAN:    Dyspnea on exertion and diaphoresis: Dyspnea is likely multifactorial, including lung disease (asthma), morbid obesity, obstructive sleep apnea, and deconditioning.  Recent echocardiogram did not show significant systolic or diastolic dysfunction.  I suspect that significant coronary insufficiency is not the driving force behind her symptoms either.  We have agreed to defer further ischemia testing at this time in favor of weight loss, exercise, and improved blood pressure control.  If symptoms worsen, we will need to consider moving forward with myocardial perfusion stress test or cardiac CTA.  Hypertension: Blood pressure suboptimally controlled (goal less than 130/80).  We will obtain a basic metabolic panel at Jasmine Woods's convenience with plans to increase telmisartan-HCTZ to 80/25 mg daily, if renal function and potassium allow.  Importance of sodium  restriction, exercise, and weight loss were stressed.  Morbid obesity: Unfortunately, Jasmine Woods has not lost any weight since our last visit.  We discussed the importance of weight loss through diet and exercise.  COVID-19 Education: The signs and symptoms of COVID-19 were discussed with the patient and how to seek care  for testing (follow up with PCP or arrange E-visit).  The importance of social distancing was discussed today.  Time:   Today, I have spent 12 minutes with the patient with telehealth technology discussing the above problems.    Medication Adjustments/Labs and Tests Ordered: Current medicines are reviewed at length with the patient today.  Concerns regarding medicines are outlined above.   Tests Ordered: Orders Placed This Encounter  Procedures   Basic metabolic panel   Basic metabolic panel    Medication Changes: None today; anticipate increasing telmisartan-HCTZ to 80/25 mg daily if renal function/potassium are stable on pending BMP.  Disposition:  Follow up in 4 month(s)  Signed, Nelva Bush, MD  11/11/2018 10:01 AM    [place]

## 2018-11-12 ENCOUNTER — Encounter: Payer: Self-pay | Admitting: Family Medicine

## 2018-11-13 ENCOUNTER — Other Ambulatory Visit: Admission: RE | Admit: 2018-11-13 | Payer: 59 | Source: Home / Self Care

## 2018-11-14 MED FILL — TELMISARTAN-HCTZ 40-12.5 MG: 40-12.5 | 30 days supply | Qty: 30 | Fill #0

## 2018-11-14 MED FILL — TRULICITY 1.5 MG/0.5 ML PEN: 1.5 | 30 days supply | Qty: 2 | Fill #0

## 2018-11-17 ENCOUNTER — Other Ambulatory Visit: Payer: Self-pay | Admitting: Family Medicine

## 2018-11-17 DIAGNOSIS — E1140 Type 2 diabetes mellitus with diabetic neuropathy, unspecified: Secondary | ICD-10-CM

## 2018-11-17 MED ORDER — DULAGLUTIDE 1.5 MG/0.5ML ~~LOC~~ SOAJ: 1.5000 mg | SUBCUTANEOUS | 2 refills | Status: DC

## 2018-11-19 ENCOUNTER — Other Ambulatory Visit
Admission: RE | Admit: 2018-11-19 | Discharge: 2018-11-19 | Disposition: A | Discharge status: Home / Self Care | Payer: 59 | Source: Ambulatory Visit | Attending: Internal Medicine | Admitting: Internal Medicine

## 2018-11-19 DIAGNOSIS — Z79899 Other long term (current) drug therapy: Secondary | ICD-10-CM | POA: Diagnosis not present

## 2018-11-19 LAB — BASIC METABOLIC PANEL
Anion gap: 10 (ref 5–15)
BUN: 17 mg/dL (ref 8–23)
CO2: 27 mmol/L (ref 22–32)
Calcium: 9.3 mg/dL (ref 8.9–10.3)
Chloride: 102 mmol/L (ref 98–111)
Creatinine, Ser: 0.56 mg/dL (ref 0.44–1.00)
GFR calc Af Amer: >60 mL/min (ref >60)
GFR calc non Af Amer: >60 mL/min (ref >60)
Glucose, Bld: 94 mg/dL (ref 70–99)
Potassium: 4 mmol/L (ref 3.5–5.1)
Sodium: 139 mmol/L (ref 135–145)

## 2018-11-20 ENCOUNTER — Other Ambulatory Visit: Payer: Self-pay | Admitting: *Deleted

## 2018-11-20 ENCOUNTER — Other Ambulatory Visit: Payer: Self-pay | Admitting: Internal Medicine

## 2018-11-20 DIAGNOSIS — Z79899 Other long term (current) drug therapy: Secondary | ICD-10-CM

## 2018-11-20 DIAGNOSIS — I10 Essential (primary) hypertension: Secondary | ICD-10-CM

## 2018-11-20 DIAGNOSIS — J449 Chronic obstructive pulmonary disease, unspecified: Secondary | ICD-10-CM

## 2018-11-20 MED ORDER — TELMISARTAN-HCTZ 80-25 MG PO TABS: 1.0000 | ORAL_TABLET | Freq: Every day | ORAL | 3 refills | Status: DC

## 2018-11-23 ENCOUNTER — Other Ambulatory Visit: Payer: Self-pay | Admitting: Family Medicine

## 2018-11-23 DIAGNOSIS — Z1231 Encounter for screening mammogram for malignant neoplasm of breast: Secondary | ICD-10-CM

## 2018-11-24 ENCOUNTER — Ambulatory Visit: Payer: 59

## 2018-11-24 ENCOUNTER — Encounter: Payer: Self-pay | Admitting: Endocrinology

## 2018-11-25 ENCOUNTER — Ambulatory Visit (INDEPENDENT_AMBULATORY_CARE_PROVIDER_SITE_OTHER): Payer: 59 | Admitting: Endocrinology

## 2018-11-25 ENCOUNTER — Other Ambulatory Visit: Payer: Self-pay

## 2018-11-25 DIAGNOSIS — H401122 Primary open-angle glaucoma, left eye, moderate stage: Secondary | ICD-10-CM | POA: Diagnosis not present

## 2018-11-25 DIAGNOSIS — E042 Nontoxic multinodular goiter: Secondary | ICD-10-CM | POA: Diagnosis not present

## 2018-11-25 NOTE — Patient Instructions (Signed)
Let's recheck the ultrasound.  you will receive a phone call, about a day and time for an appointment. If there is no change, Please come back for a follow-up appointment in 1 year.

## 2018-11-25 NOTE — Progress Notes (Signed)
Subjective:    Patient ID: Jasmine Woods, female    DOB: 12-Feb-1956, 63 y.o.   MRN: [id number]  HPI telehealth visit today via phone x 7 minutes.  Alternatives to telehealth are presented to this patient, and the patient agrees to the telehealth visit. Pt is advised of the cost of the visit, and agrees to this, also.   Patient is at home, and I am at the office.   Persons attending the telehealth visit: the patient and I Pt returns for f/u of [place] (dx'ed 2017; she had bxs in 2017 and 2018, in [place] (results not available, but reported as benign); f/u US in 2019 was unchanged; she is euthyroid off thyroid rx).  She does not notice the goiter.  Past Medical History:  Diagnosis Date  . Allergy   . Anxiety   . Asthma   . Bladder polyp    Dr. Ernst Spell  . Carpal tunnel syndrome   . Depression   . Diabetes mellitus without complication ([place])    Diet controlled  . Double vision    seen by Dr. 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 Dyslipidemia   . Frequency of urination   . Glaucoma, left eye   . H/O myasthenia gravis    MOSTLY AFFECTS EYES  . HA (headache)   . Heart murmur    followed by PCP  . Hx of bariatric surgery   . Hypertension    no meds  . Internal hemorrhoids   . Myasthenia gravis ([place])    "mostly effects eyes"  . Neuropathy, generalized    diabetic on feet  . Nuclear sclerotic cataract of both eyes   . Obesity (BMI 30-39.9)   . Obstructive sleep apnea on CPAP    uses cpap  . Patient is Jehovah's Witness    no blood products  . Rotator cuff tendonitis 07/2009   also has impingment right shoulder pain, seen by Dr. Mack Guise  . Sleep apnea   . Thyroid nodule   . Vaginal dryness, menopausal   . Vitamin D deficiency     Past Surgical History:  Procedure Laterality Date  . ABDOMINAL HYSTERECTOMY  1991  . [place] SURGERY  2014  . CATARACT EXTRACTION W/PHACO Left 01/28/2017   Procedure: CATARACT EXTRACTION PHACO AND INTRAOCULAR LENS  PLACEMENT ([facility]);  Surgeon: Birder Robson, MD;  Location: [facility];  Service: Ophthalmology;  Laterality: Left;  [place] 00:37 AP% 17.8 CDE 6.61 Fluid pack lot # [id number]  . COLONOSCOPY  2012  . COLONOSCOPY WITH PROPOFOL N/A 09/30/2016   Procedure: COLONOSCOPY WITH PROPOFOL;  Surgeon: Lucilla Lame, MD;  Location: [facility];  Service: Endoscopy;  Laterality: N/A;  diabetic - diet controlled sleep apnea  . KNEE SURGERY Right 1979-1980  . PARS PLANA VITRECTOMY Left 03/27/2016   Procedure: PARS PLANA VITRECTOMY WITH 25 GAUGE AND MEMBRANE PEEL, INTERNAL LIMITING MEMBRANE;  Surgeon: Hurman Horn, MD;  Location: [facility];  Service: Ophthalmology;  Laterality: Left;  . THYROID SURGERY     BX done was benign  . UPPER GI ENDOSCOPY    . VITRECTOMY Left 04/27/2016    Social History   Socioeconomic History  . Marital status: Married    Spouse name: Gwyndolyn Saxon "Butch"  . Number of children: 2  . Years of education: Not on file  . Highest education level: Associate degree: occupational, Hotel manager, or vocational program  Occupational History  . Not on file  Social Needs  . Financial resource strain: Not hard at all  .  Food insecurity:    Worry: Never true    Inability: Never true  . Transportation needs:    Medical: No    Non-medical: No  Tobacco Use  . Smoking status: Former Smoker    Packs/day: 0.50    Years: 3.00    Pack years: 1.50    Start date: 06/24/1972    Last attempt to quit: 06/25/1975    Years since quitting: 43.4  . Smokeless tobacco: Never Used  . Tobacco comment: smoked as teenager  Substance and Sexual Activity  . Alcohol use: No    Alcohol/week: 0.0 standard drinks  . Drug use: No  . Sexual activity: Not Currently    Partners: Male  Lifestyle  . Physical activity:    Days per week: 7 days    Minutes per session: 20 min  . Stress: Not at all  Relationships  . Social connections:    Talks on phone: More than three times a week    Gets together: More than three  times a week    Attends religious service: More than 4 times per year    Active member of club or organization: Yes    Attends meetings of clubs or organizations: 1 to 4 times per year    Relationship status: Married  . Intimate partner violence:    Fear of current or ex partner: No    Emotionally abused: No    Physically abused: No    Forced sexual activity: No  Other Topics Concern  . Not on file  Social History Narrative  . Not on file    Current Outpatient Medications on File Prior to Visit  Medication Sig Dispense Refill  . acetaminophen (TYLENOL) 500 MG tablet Take 1,000 mg by mouth every 6 (six) hours as needed for mild pain. Reported on 07/06/2015    . brimonidine (ALPHAGAN) 0.2 % ophthalmic solution Place 1 drop into the left eye 2 (two) times daily.   5  . budesonide (RHINOCORT AQUA) 32 MCG/ACT nasal spray Place 2 sprays into both nostrils at bedtime. 15 mL 2  . Coenzyme Q10 (CO Q 10) 100 MG CAPS Take 1 capsule by mouth daily. 30 capsule 2  . Dulaglutide (TRULICITY) 1.5 XK/4.8JE SOPN Inject 1.5 mg into the skin once a week. 4 pen 2  . fluticasone furoate-vilanterol (BREO ELLIPTA) 200-25 MCG/INH AEPB Inhale 1 puff into the lungs daily. Rinse mouth after use. 1 each 5  . loratadine (CLARITIN) 10 MG tablet Take 1 tablet (10 mg total) by mouth 2 (two) times daily. (Patient taking differently: Take 10 mg by mouth daily. ) 60 tablet 0  . montelukast (SINGULAIR) 10 MG tablet Take 1 tablet (10 mg total) by mouth at bedtime. 90 tablet 1  . Multiple Vitamins-Minerals (MULTIVITAMIN WITH MINERALS) tablet Take 1 tablet by mouth daily.    . Omega-3 Fatty Acids (FISH OIL) 1200 MG CAPS Take 2 capsules by mouth daily.    Marland Kitchen pyridostigmine (MESTINON) 60 MG tablet Take 60 mg by mouth 4 (four) times daily. 3 TO 4 TIMES DAILY  4  . rosuvastatin (CRESTOR) 5 MG tablet Take 1 tablet (5 mg total) by mouth daily. 90 tablet 1  . telmisartan-hydrochlorothiazide (MICARDIS HCT) 80-25 MG tablet Take 1 tablet  by mouth daily. 90 tablet 3  . Travoprost, BAK Free, (TRAVATAN Z) 0.004 % SOLN ophthalmic solution Place 1 drop into the left eye at bedtime.     . VENTOLIN HFA 108 (90 Base) MCG/ACT inhaler INHALE 2 PUFFS  BY MOUTH EVERY 4 HOURS AS NEEDED SHORTNESS OF BREATH/WHEEZING 18 g 0   No current facility-administered medications on file prior to visit.     Allergies  Allergen Reactions  . Aspirin Swelling    Upset stomach  Upset stomach   . Azithromycin     Contraindicated in Myasthenia   . Ace Inhibitors     cough  . Amlodipine     Edema of feet and hands  . Gabapentin     anxiety  . Pregabalin Other (See Comments)    anxiety    Family History  Problem Relation Age of Onset  . Diabetes Mother   . Hypertension Mother   . Heart disease Mother 38       stent x 1   . Migraines Sister   . Stroke Maternal Grandfather   . Diabetes Paternal Grandfather   . Muscular dystrophy Sister   . Heart attack Maternal Aunt   . Breast cancer Neg Hx   . Thyroid disease Neg Hx       Review of Systems She denies neck pain and swelling.      Objective:   Physical Exam    Lab Results  Component Value Date   TSH 0.826 03/05/2018      Assessment & Plan:  MNG: due for f/u.    Patient Instructions  Let's recheck the ultrasound.  you will receive a phone call, about a day and time for an appointment. If there is no change, Please come back for a follow-up appointment in 1 year.

## 2018-11-27 ENCOUNTER — Other Ambulatory Visit: Payer: Self-pay | Admitting: Family Medicine

## 2018-11-27 ENCOUNTER — Other Ambulatory Visit: Payer: Self-pay

## 2018-11-27 ENCOUNTER — Ambulatory Visit (INDEPENDENT_AMBULATORY_CARE_PROVIDER_SITE_OTHER): Payer: 59 | Admitting: Family Medicine

## 2018-11-27 ENCOUNTER — Encounter: Payer: Self-pay | Admitting: Family Medicine

## 2018-11-27 ENCOUNTER — Ambulatory Visit: Payer: 59 | Admitting: Endocrinology

## 2018-11-27 VITALS — BP 142/86 | HR 73 | Temp 98.1°F | Resp 16 | Ht 62.25 in | Wt 232.9 lb

## 2018-11-27 DIAGNOSIS — E1140 Type 2 diabetes mellitus with diabetic neuropathy, unspecified: Secondary | ICD-10-CM | POA: Diagnosis not present

## 2018-11-27 DIAGNOSIS — G629 Polyneuropathy, unspecified: Secondary | ICD-10-CM

## 2018-11-27 DIAGNOSIS — Z01419 Encounter for gynecological examination (general) (routine) without abnormal findings: Secondary | ICD-10-CM | POA: Diagnosis not present

## 2018-11-27 MED ORDER — DULOXETINE HCL 60 MG PO CPEP: 60.0000 mg | ORAL_CAPSULE | Freq: Every day | ORAL | 1 refills | Status: DC

## 2018-11-27 MED FILL — ACCU-CHEK GUIDE TEST STRIP: 30 days supply | Qty: 100 | Fill #0

## 2018-11-27 MED FILL — BREO ELLIPTA 200-25 MCG INH: 200-25 | 30 days supply | Qty: 60 | Fill #0

## 2018-11-27 NOTE — Progress Notes (Signed)
Name: Jasmine Woods   MRN: [id number]    DOB: [birth year]   Date:11/27/2018       Progress Note  Subjective  Chief Complaint  Chief Complaint  Patient presents with  . Annual Exam    HPI   Patient presents for annual CPE   Diet: eating a balanced diet rich in calcium  Exercise: she is walking about 15 minutes daily   USPSTF grade A and B recommendations    Office Visit from 11/27/2018 in [facility]  AUDIT-C Score  0     Depression: Phq 9 is  positive Depression screen [facility] 2/9 11/27/2018 09/22/2018 07/31/2018 02/20/2018 10/17/2017  Decreased Interest 1 0 0 0 0  Down, Depressed, Hopeless 0 0 0 0 0  PHQ - 2 Score 1 0 0 0 0  Altered sleeping 0 1 - 0 -  Tired, decreased energy 1 0 - 0 -  Change in appetite 1 0 - 0 -  Feeling bad or failure about yourself  0 0 - 0 -  Trouble concentrating 0 1 - 0 -  Moving slowly or fidgety/restless 0 0 - 0 -  Suicidal thoughts 0 0 - 0 -  PHQ-9 Score 3 2 - 0 -  Difficult doing work/chores - Not difficult at all - - -   Hypertension: BP Readings from Last 3 Encounters:  11/27/18 (!) 142/86  11/11/18 137/72  10/21/18 (!) 148/82   Obesity: Wt Readings from Last 3 Encounters:  11/27/18 232 lb 14.4 oz (105.6 kg)  11/11/18 236 lb (107 kg)  10/21/18 234 lb 12.8 oz (106.5 kg)   BMI Readings from Last 3 Encounters:  11/27/18 42.26 kg/m  11/11/18 43.16 kg/m  10/21/18 42.95 kg/m    Hep C Screening: up to date  STD testing and prevention (HIV/chl/gon/syphilis): N/a Intimate partner violence: negative screen  Sexual History/Pain during Intercourse: not sexually active, husband has medical problems, discussed penile pump  Menstrual History/LMP/Abnormal Bleeding: discussed post-menopausal bleeding  Incontinence Symptoms: very rarely   Advanced Care Planning: A voluntary discussion about advance care planning including the explanation and discussion of advance directives.  Discussed health care proxy and Living will,  and the patient was able to identify a health care proxy as husband .  Patient does have a living will at present time. If patient does have living will, I have requested they bring this to the clinic to be scanned in to their chart.  Breast cancer:  HM Mammogram  Date Value Ref Range Status  05/12/2014 Normal  Final    BRCA gene screening: N/A Cervical cancer screening: s/p hysterectomy   Osteoporosis Screening:  HM Dexa Scan  Date Value Ref Range Status  12/29/2007 0.9/2.1  Final    Lipids:  Lab Results  Component Value Date   CHOL 175 03/05/2018   CHOL 228 (H) 10/17/2017   CHOL 174 09/05/2016   Lab Results  Component Value Date   HDL 61 03/05/2018   HDL 62 10/17/2017   HDL 67 09/05/2016   Lab Results  Component Value Date   LDLCALC 98 03/05/2018   LDLCALC 149 (H) 10/17/2017   LDLCALC 84 09/05/2016   Lab Results  Component Value Date   TRIG 80 03/05/2018   TRIG 71 10/17/2017   TRIG 116 09/05/2016   Lab Results  Component Value Date   CHOLHDL 3.7 10/17/2017   CHOLHDL 2.6 09/05/2016   CHOLHDL 3.0 08/16/2015   No results found for: LDLDIRECT  Glucose:  Glucose  Date Value Ref Range Status  03/05/2018 78 65 - 99 mg/dL Final  10/17/2014 99 mg/dL Final    Comment:    65-99 NOTE: New Reference Range  08/30/14   05/19/2013 88 65 - 99 mg/dL Final  10/14/2012 89 65 - 99 mg/dL Final   Glucose, Bld  Date Value Ref Range Status  11/19/2018 94 70 - 99 mg/dL Final  10/17/2017 90 65 - 139 mg/dL Final    Comment:    .        Non-fasting reference interval .   09/05/2016 88 65 - 99 mg/dL Final   Glucose-Capillary  Date Value Ref Range Status  01/28/2017 106 (H) 65 - 99 mg/dL Final  09/30/2016 87 65 - 99 mg/dL Final  09/30/2016 101 (H) 65 - 99 mg/dL Final    Skin cancer: discussed atypical lesions  Colorectal cancer: up to date   Lung cancer:   Low Dose CT Chest recommended if Age 50-80 years, 30 pack-year currently smoking OR have quit w/in 15years.  Patient does not qualify.   ECG:06/2018  Patient Active Problem List   Diagnosis Date Noted  . Multinodular goiter 11/25/2018  . Essential hypertension 11/11/2018  . DOE (dyspnea on exertion) 04/15/2018  . Hyperlipidemia 12/09/2017  . Diaphoresis 11/26/2017  . Special screening for malignant neoplasms, colon   . Epiretinal membrane (ERM) of left eye 09/09/2016  . Glaucoma, left eye 09/05/2016  . Seronegative myasthenia gravis ([place]) 08/08/2015  . Psoriasis 07/06/2015  . Stress incontinence 07/06/2015  . Chronic tension-type headache, intractable 05/29/2015  . Anxiety and depression 04/12/2015  . Asthma, mild intermittent 04/12/2015  . Bladder polyp 04/12/2015  . Carpal tunnel syndrome 04/12/2015  . Binocular vision disorder with diplopia 04/12/2015  . Dyslipidemia 04/12/2015  . H/O: HTN (hypertension) 04/12/2015  . Hemorrhoids, internal 04/12/2015  . Neuropathy 04/12/2015  . Nuclear sclerotic cataract 04/12/2015  . Morbid obesity ([place]) 04/12/2015  . Obstructive apnea 04/12/2015  . Perennial allergic rhinitis 04/12/2015  . 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 Type 2 diabetes, controlled, with neuropathy ([place]) 04/12/2015  . Vitamin D deficiency 04/12/2015  . Cephalalgia 08/16/2014  . Lump or mass in breast 06/14/2013  . Bariatric surgery status 10/12/2012    Past Surgical History:  Procedure Laterality Date  . ABDOMINAL HYSTERECTOMY  1991  . [place] SURGERY  2014  . CATARACT EXTRACTION W/PHACO Left 01/28/2017   Procedure: CATARACT EXTRACTION PHACO AND INTRAOCULAR LENS PLACEMENT (IOC);  Surgeon: Birder Robson, MD;  Location: [facility];  Service: Ophthalmology;  Laterality: Left;  [place] 00:37 AP% 17.8 CDE 6.61 Fluid pack lot # [id number]  . COLONOSCOPY  2012  . COLONOSCOPY WITH PROPOFOL N/A 09/30/2016   Procedure: COLONOSCOPY WITH PROPOFOL;  Surgeon: Lucilla Lame, MD;  Location: [place];  Service: Endoscopy;  Laterality: N/A;  diabetic -  diet controlled sleep apnea  . KNEE SURGERY Right 1979-1980  . PARS PLANA VITRECTOMY Left 03/27/2016   Procedure: PARS PLANA VITRECTOMY WITH 25 GAUGE AND MEMBRANE PEEL, INTERNAL LIMITING MEMBRANE;  Surgeon: Hurman Horn, MD;  Location: [place];  Service: Ophthalmology;  Laterality: Left;  . THYROID SURGERY     BX done was benign  . UPPER GI ENDOSCOPY    . VITRECTOMY Left 04/27/2016    Family History  Problem Relation Age of Onset  . Diabetes Mother   . Hypertension Mother   . Heart disease Mother 37       stent x 1   . Migraines Sister   .  Stroke Maternal Grandfather   . Diabetes Paternal Grandfather   . Muscular dystrophy Sister   . Heart attack Maternal Aunt   . Breast cancer Neg Hx   . Thyroid disease Neg Hx     Social History   Socioeconomic History  . Marital status: Married    Spouse name: Gwyndolyn Saxon "Butch"  . Number of children: 2  . Years of education: Not on file  . Highest education level: Associate degree: occupational, Hotel manager, or vocational program  Occupational History  . Not on file  Social Needs  . Financial resource strain: Not hard at all  . Food insecurity:    Worry: Never true    Inability: Never true  . Transportation needs:    Medical: No    Non-medical: No  Tobacco Use  . Smoking status: Former Smoker    Packs/day: 0.50    Years: 3.00    Pack years: 1.50    Start date: 06/24/1972    Last attempt to quit: 06/25/1975    Years since quitting: 43.4  . Smokeless tobacco: Never Used  . Tobacco comment: smoked as teenager  Substance and Sexual Activity  . Alcohol use: No    Alcohol/week: 0.0 standard drinks  . Drug use: No  . Sexual activity: Not Currently    Partners: Male  Lifestyle  . Physical activity:    Days per week: 7 days    Minutes per session: 20 min  . Stress: Not at all  Relationships  . Social connections:    Talks on phone: More than three times a week    Gets together: More than three times a week    Attends religious  service: More than 4 times per year    Active member of club or organization: Yes    Attends meetings of clubs or organizations: 1 to 4 times per year    Relationship status: Married  . Intimate partner violence:    Fear of current or ex partner: No    Emotionally abused: No    Physically abused: No    Forced sexual activity: No  Other Topics Concern  . Not on file  Social History Narrative  . Not on file     Current Outpatient Medications:  .  acetaminophen (TYLENOL) 500 MG tablet, Take 1,000 mg by mouth every 6 (six) hours as needed for mild pain. Reported on 07/06/2015, Disp: , Rfl:  .  brimonidine (ALPHAGAN) 0.2 % ophthalmic solution, Place 1 drop into the left eye 2 (two) times daily. , Disp: , Rfl: 5 .  budesonide (RHINOCORT AQUA) 32 MCG/ACT nasal spray, Place 2 sprays into both nostrils at bedtime., Disp: 15 mL, Rfl: 2 .  Coenzyme Q10 (CO Q 10) 100 MG CAPS, Take 1 capsule by mouth daily., Disp: 30 capsule, Rfl: 2 .  Dulaglutide (TRULICITY) 1.5 RD/4.0CX SOPN, Inject 1.5 mg into the skin once a week., Disp: 4 pen, Rfl: 2 .  DULoxetine (CYMBALTA) 60 MG capsule, Take 1 capsule (60 mg total) by mouth daily., Disp: 90 capsule, Rfl: 1 .  fluticasone furoate-vilanterol (BREO ELLIPTA) 200-25 MCG/INH AEPB, Inhale 1 puff into the lungs daily. Rinse mouth after use., Disp: 1 each, Rfl: 5 .  glucose blood (ACCU-CHEK GUIDE) test strip, Use as instructed, Disp: 100 each, Rfl: 12 .  loratadine (CLARITIN) 10 MG tablet, Take 1 tablet (10 mg total) by mouth 2 (two) times daily. (Patient taking differently: Take 10 mg by mouth daily. ), Disp: 60 tablet, Rfl: 0 .  montelukast (SINGULAIR) 10 MG tablet, Take 1 tablet (10 mg total) by mouth at bedtime., Disp: 90 tablet, Rfl: 1 .  Multiple Vitamins-Minerals (MULTIVITAMIN WITH MINERALS) tablet, Take 1 tablet by mouth daily., Disp: , Rfl:  .  Omega-3 Fatty Acids (FISH OIL) 1200 MG CAPS, Take 2 capsules by mouth daily., Disp: , Rfl:  .  pyridostigmine  (MESTINON) 60 MG tablet, Take 60 mg by mouth 4 (four) times daily. 3 TO 4 TIMES DAILY, Disp: , Rfl: 4 .  rosuvastatin (CRESTOR) 5 MG tablet, Take 1 tablet (5 mg total) by mouth daily., Disp: 90 tablet, Rfl: 1 .  telmisartan-hydrochlorothiazide (MICARDIS HCT) 80-25 MG tablet, Take 1 tablet by mouth daily., Disp: 90 tablet, Rfl: 3 .  Travoprost, BAK Free, (TRAVATAN Z) 0.004 % SOLN ophthalmic solution, Place 1 drop into the left eye at bedtime. , Disp: , Rfl:  .  VENTOLIN HFA 108 (90 Base) MCG/ACT inhaler, INHALE 2 PUFFS BY MOUTH EVERY 4 HOURS AS NEEDED SHORTNESS OF BREATH/WHEEZING, Disp: 18 g, Rfl: 0  Allergies  Allergen Reactions  . Aspirin Swelling    Upset stomach  Upset stomach   . Azithromycin     Contraindicated in Myasthenia   . Ace Inhibitors     cough  . Amlodipine     Edema of feet and hands  . Gabapentin     anxiety  . Pregabalin Other (See Comments)    anxiety     ROS  Constitutional: Negative for fever or weight change.  Respiratory: Negative for cough and shortness of breath.   Cardiovascular: Negative for chest pain or palpitations.  Gastrointestinal: Negative for abdominal pain, no bowel changes.  Musculoskeletal: Negative for gait problem or joint swelling.  Skin: Negative for rash.  Neurological: Negative for dizziness or headache.  No other specific complaints in a complete review of systems (except as listed in HPI above).  Objective  Vitals:   11/27/18 0935 11/27/18 1025  BP: (!) 148/76 (!) 142/86  Pulse: 73   Resp: 16   Temp: 98.1 F (36.7 C)   TempSrc: Oral   SpO2: 99%   Weight: 232 lb 14.4 oz (105.6 kg)   Height: 5' 2.25" (1.581 m)     Body mass index is 42.26 kg/m.  Physical Exam  Constitutional: Patient appears well-developed and well-nourished. Obesity  No distress.  HENT: Head: Normocephalic and atraumatic. Ears: B TMs ok, no erythema or effusion; Nose: Nose normal. Mouth/Throat: Oropharynx is clear and moist. No oropharyngeal exudate.   Eyes: Conjunctivae and EOM are normal. Pupils are equal, round, and reactive to light. No scleral icterus.  Neck: Normal range of motion. Neck supple. No JVD present. No thyromegaly present.  Cardiovascular: Normal rate, regular rhythm and normal heart sounds.  No murmur heard. No BLE edema. Pulmonary/Chest: Effort normal and breath sounds normal. No respiratory distress. Abdominal: Soft. Bowel sounds are normal, no distension. There is no tenderness. no masses Breast: no lumps or masses, no nipple discharge or rashes FEMALE GENITALIA:  External genitalia normal External urethra normal No pelvic exam  RECTAL: not done  Musculoskeletal: Normal range of motion, no joint effusions. No gross deformities Neurological: he is alert and oriented to person, place, and time. No cranial nerve deficit. Coordination, balance, strength, speech and gait are normal.  Skin: Skin is warm and dry. No rash noted. No erythema.  Psychiatric: Patient has a normal mood and affect. behavior is normal. Judgment and thought content normal.  Recent Results (from the past 2160 hour(s))  CBC w/Diff     Status: None   Collection Time: 10/29/18  2:58 PM  Result Value Ref Range   WBC 10.5 4.0 - 10.5 K/uL   RBC 4.51 3.87 - 5.11 MIL/uL   Hemoglobin 13.6 12.0 - 15.0 g/dL   HCT 39.7 36.0 - 46.0 %   MCV 88.0 80.0 - 100.0 fL   MCH 30.2 26.0 - 34.0 pg   MCHC 34.3 30.0 - 36.0 g/dL   RDW 12.6 11.5 - 15.5 %   Platelets 295 150 - 400 K/uL   nRBC 0.0 0.0 - 0.2 %   Neutrophils Relative % 57 %   Neutro Abs 6.0 1.7 - 7.7 K/uL   Lymphocytes Relative 35 %   Lymphs Abs 3.7 0.7 - 4.0 K/uL   Monocytes Relative 6 %   Monocytes Absolute 0.7 0.1 - 1.0 K/uL   Eosinophils Relative 1 %   Eosinophils Absolute 0.1 0.0 - 0.5 K/uL   Basophils Relative 1 %   Basophils Absolute 0.1 0.0 - 0.1 K/uL   Immature Granulocytes 0 %   Abs Immature Granulocytes 0.03 0.00 - 0.07 K/uL    Comment: Performed at [facility], [address] [place], [place] [address]  IgE     Status: None   Collection Time: 10/29/18  2:58 PM  Result Value Ref Range   IgE (Immunoglobulin E), Serum 18 6 - 495 IU/mL    Comment: (NOTE) Performed At: [facility] [place], [place] [id number] Rush Farmer MD [id number]   HM DIABETES EYE EXAM     Status: None   Collection Time: 11/11/18 12:00 AM  Result Value Ref Range   HM Diabetic Eye Exam No Retinopathy No Retinopathy    Comment: Dr. George Ina, [place] metabolic panel     Status: None   Collection Time: 11/19/18 10:02 AM  Result Value Ref Range   Sodium 139 135 - 145 mmol/L   Potassium 4.0 3.5 - 5.1 mmol/L   Chloride 102 98 - 111 mmol/L   CO2 27 22 - 32 mmol/L   Glucose, Bld 94 70 - 99 mg/dL   BUN 17 8 - 23 mg/dL   Creatinine, Ser 0.56 0.44 - 1.00 mg/dL   Calcium 9.3 8.9 - 10.3 mg/dL   GFR calc non Af Amer >60 >60 mL/min   GFR calc Af Amer >60 >60 mL/min   Anion gap 10 5 - 15    Comment: Performed at [facility], [address] [place], [place] [address]    Diabetic Foot Exam: Diabetic Foot Exam - Simple   Simple Foot Form Diabetic Foot exam was performed with the following findings:  Yes 11/27/2018 10:14 AM  Visual Inspection No deformities, no ulcerations, no other skin breakdown bilaterally:  Yes Sensation Testing Intact to touch and monofilament testing bilaterally:  Yes Pulse Check Posterior Tibialis and Dorsalis pulse intact bilaterally:  Yes Comments      Fall Risk: Fall Risk  09/22/2018 07/31/2018 05/25/2018 02/20/2018 10/17/2017  Falls in the past year? 0 0 0 No No  Number falls in past yr: 0 0 0 - -  Injury with Fall? 0 0 0 - -    Assessment & Plan  1. Well woman exam  - Lipid panel - Hemoglobin A1c - TSH   -USPSTF grade A and B recommendations reviewed with patient; age-appropriate recommendations, preventive care, screening tests, etc discussed and encouraged; healthy living encouraged; see  AVS for patient  education given to patient -Discussed importance of 150 minutes of physical activity weekly, eat two servings of fish weekly, eat one serving of tree nuts ( cashews, pistachios, pecans, almonds.Marland Kitchen) every other day, eat 6 servings of fruit/vegetables daily and drink plenty of water and avoid sweet beverages.

## 2018-11-28 LAB — LIPID PANEL
Cholesterol: 184 mg/dL (ref <200)
HDL: 67 mg/dL (ref >=50)
LDL Cholesterol (Calc): 99 mg/dL (calc)
Non-HDL Cholesterol (Calc): 117 mg/dL (calc) (ref <130)
Total CHOL/HDL Ratio: 2.7 (calc) (ref <5.0)
Triglycerides: 90 mg/dL (ref <150)

## 2018-11-28 LAB — HEMOGLOBIN A1C
Hgb A1c MFr Bld: 5.5 % of total Hgb (ref <5.7)
Mean Plasma Glucose: 111 (calc)
eAG (mmol/L): 6.2 (calc)

## 2018-11-28 LAB — TSH: TSH: 0.66 mIU/L (ref 0.40–4.50)

## 2018-11-30 ENCOUNTER — Inpatient Hospital Stay: Admission: RE | Admit: 2018-11-30 | Payer: 59 | Source: Ambulatory Visit

## 2018-12-02 ENCOUNTER — Other Ambulatory Visit: Payer: Self-pay

## 2018-12-02 ENCOUNTER — Ambulatory Visit
Admission: RE | Admit: 2018-12-02 | Discharge: 2018-12-02 | Disposition: A | Discharge status: Home / Self Care | Payer: 59 | Source: Ambulatory Visit | Attending: Endocrinology | Admitting: Endocrinology

## 2018-12-02 ENCOUNTER — Ambulatory Visit: Payer: 59 | Admitting: Internal Medicine

## 2018-12-02 ENCOUNTER — Encounter: Payer: Self-pay | Admitting: Endocrinology

## 2018-12-02 DIAGNOSIS — E042 Nontoxic multinodular goiter: Secondary | ICD-10-CM | POA: Diagnosis not present

## 2018-12-03 ENCOUNTER — Ambulatory Visit: Payer: 59

## 2018-12-03 NOTE — Telephone Encounter (Signed)
Called pt to inform that results from [facility] will not be released to our office without a signed records release. Asked that she call their office to make arrangements to sign release. Once records received, will forward to Dr. Loanne Drilling for his review.

## 2018-12-03 NOTE — Telephone Encounter (Signed)
Please advise 

## 2018-12-04 ENCOUNTER — Other Ambulatory Visit: Payer: Self-pay

## 2018-12-08 ENCOUNTER — Ambulatory Visit: Payer: 59 | Admitting: Internal Medicine

## 2018-12-08 ENCOUNTER — Other Ambulatory Visit: Payer: Self-pay

## 2018-12-10 ENCOUNTER — Telehealth: Payer: Self-pay

## 2018-12-10 NOTE — Telephone Encounter (Signed)
Not unless it is on my desk.

## 2018-12-10 NOTE — Telephone Encounter (Signed)
Following message routed to Dr. Loanne Drilling:  Have you received the biopsy results from 2016 and 2017, [facility]?

## 2018-12-14 ENCOUNTER — Ambulatory Visit
Admission: RE | Admit: 2018-12-14 | Discharge: 2018-12-14 | Disposition: A | Discharge status: Home / Self Care | Payer: 59 | Source: Ambulatory Visit | Attending: Family Medicine | Admitting: Family Medicine

## 2018-12-14 ENCOUNTER — Other Ambulatory Visit
Admission: RE | Admit: 2018-12-14 | Discharge: 2018-12-14 | Disposition: A | Discharge status: Home / Self Care | Payer: 59 | Source: Ambulatory Visit | Attending: Internal Medicine | Admitting: Internal Medicine

## 2018-12-14 ENCOUNTER — Other Ambulatory Visit: Payer: Self-pay

## 2018-12-14 DIAGNOSIS — Z1159 Encounter for screening for other viral diseases: Secondary | ICD-10-CM | POA: Diagnosis not present

## 2018-12-14 DIAGNOSIS — J4540 Moderate persistent asthma, uncomplicated: Secondary | ICD-10-CM | POA: Insufficient documentation

## 2018-12-14 DIAGNOSIS — Z1231 Encounter for screening mammogram for malignant neoplasm of breast: Secondary | ICD-10-CM | POA: Diagnosis not present

## 2018-12-15 LAB — NOVEL CORONAVIRUS, NAA (HOSP ORDER, SEND-OUT TO REF LAB; TAT 18-24 HRS): SARS-CoV-2, NAA: NOT DETECTED

## 2018-12-17 ENCOUNTER — Other Ambulatory Visit: Payer: Self-pay

## 2018-12-17 ENCOUNTER — Other Ambulatory Visit: Admission: RE | Admit: 2018-12-17 | Payer: 59 | Source: Ambulatory Visit

## 2018-12-17 ENCOUNTER — Ambulatory Visit: Payer: 59 | Attending: Internal Medicine

## 2018-12-17 DIAGNOSIS — Z1231 Encounter for screening mammogram for malignant neoplasm of breast: Secondary | ICD-10-CM | POA: Diagnosis not present

## 2018-12-17 DIAGNOSIS — J4540 Moderate persistent asthma, uncomplicated: Secondary | ICD-10-CM | POA: Diagnosis not present

## 2018-12-17 DIAGNOSIS — Z1159 Encounter for screening for other viral diseases: Secondary | ICD-10-CM | POA: Diagnosis not present

## 2018-12-23 ENCOUNTER — Other Ambulatory Visit: Payer: Self-pay

## 2018-12-23 ENCOUNTER — Encounter: Payer: Self-pay | Admitting: Internal Medicine

## 2018-12-23 ENCOUNTER — Ambulatory Visit: Payer: 59 | Admitting: Internal Medicine

## 2018-12-23 VITALS — BP 130/78 | HR 65 | Temp 98.2°F | Ht 62.0 in | Wt 235.0 lb

## 2018-12-23 DIAGNOSIS — J4540 Moderate persistent asthma, uncomplicated: Secondary | ICD-10-CM | POA: Diagnosis not present

## 2018-12-23 DIAGNOSIS — G4733 Obstructive sleep apnea (adult) (pediatric): Secondary | ICD-10-CM

## 2018-12-23 NOTE — Progress Notes (Signed)
[facility] Consultation      Assessment and Plan:  Allergic asthma with dyspnea on exertion. - Doing better with Breo, continue once daily.  - Continue Claritin, Singulair.  Recommended that she keep her dog outside of the bedroom. - AEC 100  History of myasthenia gravis. - This is not typically affected her breathing in the past, pulmonary function test results were reviewed with patient's, no evidence of obstructive lung disease.  Obstructive sleep apnea. - Currently using CPAP every night, interested in getting a new machine, will order new auto CPAP.   Return in about 1 year (around 12/23/2019).   Date: 12/23/2018  MRN# [id number] Pamalee Marcoe 14/48/1857   Marnell Davidson Tillis is a 63 y.o. old female seen in consultation for chief complaint of:    Chief Complaint  Patient presents with  . Follow-up    F/U Asthma and PFT- Breathing has improved since last OV. Feels like BREO is working for her.Has not needed rescue inhaler since last visit.    HPI:  Ruqaya Strauss is a 63 y.o. female with a history of diabetes mellitus and bariatric surgery in 2014, asthma, myasthenia gravis followed at [facility].  At last visit she had mild dyspnea suspected being secondary to asthma, however with her history of myasthenia there was concern that this may be affecting her breathing.  Since that time she is undergone pulmonary function testing which showed FEV1 of nearly 100%, normal FVC.  Though MIP/map were not checked her pulmonary functions overall appear to be very good.  Since that time she has been on Breo inhaler and has done fairly well. She feels that her breathing has been doing better, she has been using it once per day, she has not used her rescue inhaler in a few weeks.  Her typical symptoms of myasthenia flare are ptosis double vision, treated with Mestinon.  She has a history of obstructive sleep apnea on CPAP. She is doing well on cpap, she  has been on it for 20+ years, her current machine is more than 64 years old, recently not blowing as well, interested in getting a new one.  She has a dog, in bed with her.  She denies reflux, does have sinus drainage, takes claritin, singulair and rhinocort. She think that singulair helps with dyspnea. She has taken advair in the past and helped several years ago when she had a URTI, but stopped since then.   She has had allergy testing remotely, it was a blood test, not sure of the results.   CBC 10/29/18>> AEC 100.  Echo 07/28/18>> EF 60%, RVSP 30.  CBC 10/17/2017>> absolute eosinophil count 120. CT chest 02/03/2017>> imaging personally reviewed, lung volumes are slightly reduced from obesity, lungs are otherwise unremarkable.  PFT 12/23/18>> tracings personally reviewed. FVC is 86% predicted, FEV1 is 98% predicted, there is no significant improvement bronchodilator.  TLC is 91% predicted, RV is 80% predicted, we will see-RV ratio is normal.  Flow volume loop is unremarkable, DLCO is 114% predicted.  Overall this test shows normal pulmonary functions without evidence of COPD.  MIP/map were not checked.   PMHX:   Past Medical History:  Diagnosis Date  . Allergy   . Anxiety   . Asthma   . Bladder polyp    Dr. Ernst Spell  . Carpal tunnel syndrome   . Depression   . Diabetes mellitus without complication ([place])    Diet controlled  . Double vision    seen by  Dr. 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 Dyslipidemia   . Frequency of urination   . Glaucoma, left eye   . H/O myasthenia gravis    MOSTLY AFFECTS EYES  . HA (headache)   . Heart murmur    followed by PCP  . Hx of bariatric surgery   . Hypertension    no meds  . Internal hemorrhoids   . Myasthenia gravis ([place])    "mostly effects eyes"  . Neuropathy, generalized    diabetic on feet  . Nuclear sclerotic cataract of both eyes   . Obesity (BMI 30-39.9)   . Obstructive sleep apnea on CPAP    uses cpap  . Patient is Jehovah's  Witness    no blood products  . Rotator cuff tendonitis 07/2009   also has impingment right shoulder pain, seen by Dr. Mack Guise  . Sleep apnea   . Thyroid nodule   . Vaginal dryness, menopausal   . Vitamin D deficiency    Surgical Hx:  Past Surgical History:  Procedure Laterality Date  . ABDOMINAL HYSTERECTOMY  1991  . [place] SURGERY  2014  . CATARACT EXTRACTION W/PHACO Left 01/28/2017   Procedure: CATARACT EXTRACTION PHACO AND INTRAOCULAR LENS PLACEMENT (IOC);  Surgeon: Birder Robson, MD;  Location: [facility];  Service: Ophthalmology;  Laterality: Left;  [place] 00:37 AP% 17.8 CDE 6.61 Fluid pack lot # [id number]  . COLONOSCOPY  2012  . COLONOSCOPY WITH PROPOFOL N/A 09/30/2016   Procedure: COLONOSCOPY WITH PROPOFOL;  Surgeon: Lucilla Lame, MD;  Location: [facility];  Service: Endoscopy;  Laterality: N/A;  diabetic - diet controlled sleep apnea  . KNEE SURGERY Right 1979-1980  . OOPHORECTOMY    . PARS PLANA VITRECTOMY Left 03/27/2016   Procedure: PARS PLANA VITRECTOMY WITH 25 GAUGE AND MEMBRANE PEEL, INTERNAL LIMITING MEMBRANE;  Surgeon: Hurman Horn, MD;  Location: [facility];  Service: Ophthalmology;  Laterality: Left;  . THYROID SURGERY     BX done was benign  . UPPER GI ENDOSCOPY    . VITRECTOMY Left 04/27/2016   Family Hx:  Family History  Problem Relation Age of Onset  . Diabetes Mother   . Hypertension Mother   . Heart disease Mother 49       stent x 1   . Migraines Sister   . Stroke Maternal Grandfather   . Diabetes Paternal Grandfather   . Muscular dystrophy Sister   . Heart attack Maternal Aunt   . Breast cancer Neg Hx   . Thyroid disease Neg Hx    Social Hx:   Social History   Tobacco Use  . Smoking status: Former Smoker    Packs/day: 0.50    Years: 3.00    Pack years: 1.50    Start date: 06/24/1972    Quit date: 06/25/1975    Years since quitting: 43.5  . Smokeless tobacco: Never Used  . Tobacco comment: smoked as teenager  Substance Use Topics  .  Alcohol use: No    Alcohol/week: 0.0 standard drinks  . Drug use: No   Medication:    Current Outpatient Medications:  .  ACCU-CHEK GUIDE test strip, USE AS DIRECTED, Disp: 100 each, Rfl: 12 .  acetaminophen (TYLENOL) 500 MG tablet, Take 1,000 mg by mouth every 6 (six) hours as needed for mild pain. Reported on 07/06/2015, Disp: , Rfl:  .  brimonidine (ALPHAGAN) 0.2 % ophthalmic solution, Place 1 drop into the left eye 2 (two) times daily. , Disp: , Rfl: 5 .  budesonide (RHINOCORT AQUA) 32 MCG/ACT nasal spray, Place 2 sprays into both nostrils at bedtime., Disp: 15 mL, Rfl: 2 .  Coenzyme Q10 (CO Q 10) 100 MG CAPS, Take 1 capsule by mouth daily., Disp: 30 capsule, Rfl: 2 .  Dulaglutide (TRULICITY) 1.5 GE/3.6OQ SOPN, Inject 1.5 mg into the skin once a week., Disp: 4 pen, Rfl: 2 .  DULoxetine (CYMBALTA) 60 MG capsule, Take 1 capsule (60 mg total) by mouth daily., Disp: 90 capsule, Rfl: 1 .  fluticasone furoate-vilanterol (BREO ELLIPTA) 200-25 MCG/INH AEPB, Inhale 1 puff into the lungs daily. Rinse mouth after use., Disp: 1 each, Rfl: 5 .  loratadine (CLARITIN) 10 MG tablet, Take 1 tablet (10 mg total) by mouth 2 (two) times daily. (Patient taking differently: Take 10 mg by mouth daily. ), Disp: 60 tablet, Rfl: 0 .  montelukast (SINGULAIR) 10 MG tablet, Take 1 tablet (10 mg total) by mouth at bedtime., Disp: 90 tablet, Rfl: 1 .  Multiple Vitamins-Minerals (MULTIVITAMIN WITH MINERALS) tablet, Take 1 tablet by mouth daily., Disp: , Rfl:  .  Omega-3 Fatty Acids (FISH OIL) 1200 MG CAPS, Take 2 capsules by mouth daily., Disp: , Rfl:  .  pyridostigmine (MESTINON) 60 MG tablet, Take 60 mg by mouth 4 (four) times daily. 3 TO 4 TIMES DAILY, Disp: , Rfl: 4 .  rosuvastatin (CRESTOR) 5 MG tablet, Take 1 tablet (5 mg total) by mouth daily., Disp: 90 tablet, Rfl: 1 .  telmisartan-hydrochlorothiazide (MICARDIS HCT) 80-25 MG tablet, Take 1 tablet by mouth daily., Disp: 90 tablet, Rfl: 3 .  Travoprost, BAK Free,  (TRAVATAN Z) 0.004 % SOLN ophthalmic solution, Place 1 drop into the left eye at bedtime. , Disp: , Rfl:  .  VENTOLIN HFA 108 (90 Base) MCG/ACT inhaler, INHALE 2 PUFFS BY MOUTH EVERY 4 HOURS AS NEEDED SHORTNESS OF BREATH/WHEEZING, Disp: 18 g, Rfl: 0   Allergies:  Aspirin, Azithromycin, Ace inhibitors, Amlodipine, Gabapentin, and Pregabalin  Review of Systems: Gen:  Denies  fever, sweats, chills HEENT: Denies blurred vision, double vision. bleeds, sore throat Cvc:  No dizziness, chest pain. Resp:   Denies cough or sputum production, shortness of breath Gi: Denies swallowing difficulty, stomach pain. Gu:  Denies bladder incontinence, burning urine Ext:   No Joint pain, stiffness. Skin: No skin rash,  hives  Endoc:  No polyuria, polydipsia. Psych: No depression, insomnia. Other:  All other systems were reviewed with the patient and were negative other that what is mentioned in the HPI.   Physical Examination:   VS: BP 130/78 (BP Location: Left Wrist, Cuff Size: Normal)   Pulse 65   Temp 98.2 F (36.8 C) (Skin)   Ht 5\' 2"  (1.575 m)   Wt 235 lb (106.6 kg)   SpO2 98%   BMI 42.98 kg/m   General Appearance: No distress  Neuro:without focal findings,  speech normal,  HEENT: PERRLA, EOM intact.  Mallampati 3 Pulmonary: normal breath sounds, No wheezing.  CardiovascularNormal S1,S2.  No m/r/g.   Abdomen: Benign, Soft, non-tender. Renal:  No costovertebral tenderness  GU:  No performed at this time. Endoc: No evident thyromegaly, no signs of acromegaly. Skin:   warm, no rashes, no ecchymosis  Extremities: normal, no cyanosis, clubbing.  Other findings:    LABORATORY PANEL:   CBC No results for input(s): WBC, HGB, HCT, PLT in the last 168 hours. ------------------------------------------------------------------------------------------------------------------  Chemistries  No results for input(s): NA, K, CL, CO2, GLUCOSE, BUN, CREATININE, CALCIUM, MG, AST, ALT, ALKPHOS, BILITOT  in the  last 168 hours.  Invalid input(s): GFRCGP ------------------------------------------------------------------------------------------------------------------  Cardiac Enzymes No results for input(s): TROPONINI in the last 168 hours. ------------------------------------------------------------  RADIOLOGY:  No results found.     Thank  you for the consultation and for allowing [facility] to assist in the care of your patient. Our recommendations are noted above.  Please contact us if we can be of further service.   Marda Stalker, M.D., F.C.C.P.  Board Certified in Internal Medicine, Pulmonary Medicine, [place], and Sleep Medicine.  [facility] Office Number: [phone]   12/23/2018

## 2018-12-23 NOTE — Addendum Note (Signed)
Addended by: Lebron Conners on: 12/23/2018 04:51 PM   Modules accepted: Orders

## 2018-12-23 NOTE — Patient Instructions (Addendum)
Will place order for new Auto CPAP with pressure range of 5-20 cm H2O. If you are started on a new cpap we may need to see you back within 3 months.  Continue Breo.  Use albuterol as needed.

## 2018-12-31 NOTE — Telephone Encounter (Signed)
To date, thyroid biopsy results still not received. Called pt to f/u. Unable to reach d/t no answer.

## 2019-01-06 DIAGNOSIS — H401122 Primary open-angle glaucoma, left eye, moderate stage: Secondary | ICD-10-CM | POA: Diagnosis not present

## 2019-01-06 NOTE — Telephone Encounter (Signed)
Rhonda please advise. Thanks 

## 2019-01-14 DIAGNOSIS — G4733 Obstructive sleep apnea (adult) (pediatric): Secondary | ICD-10-CM | POA: Diagnosis not present

## 2019-01-18 ENCOUNTER — Ambulatory Visit: Payer: 59 | Admitting: Family Medicine

## 2019-01-18 ENCOUNTER — Other Ambulatory Visit: Payer: Self-pay

## 2019-01-18 ENCOUNTER — Encounter: Payer: Self-pay | Admitting: Family Medicine

## 2019-01-18 VITALS — BP 138/88 | HR 90 | Temp 97.0°F | Resp 16 | Ht 62.25 in | Wt 241.0 lb

## 2019-01-18 DIAGNOSIS — E1140 Type 2 diabetes mellitus with diabetic neuropathy, unspecified: Secondary | ICD-10-CM

## 2019-01-18 DIAGNOSIS — E042 Nontoxic multinodular goiter: Secondary | ICD-10-CM | POA: Diagnosis not present

## 2019-01-18 DIAGNOSIS — I10 Essential (primary) hypertension: Secondary | ICD-10-CM

## 2019-01-18 DIAGNOSIS — Z9884 Bariatric surgery status: Secondary | ICD-10-CM

## 2019-01-18 DIAGNOSIS — G7000 Myasthenia gravis without (acute) exacerbation: Secondary | ICD-10-CM

## 2019-01-18 DIAGNOSIS — E1169 Type 2 diabetes mellitus with other specified complication: Secondary | ICD-10-CM

## 2019-01-18 DIAGNOSIS — E785 Hyperlipidemia, unspecified: Secondary | ICD-10-CM

## 2019-01-18 DIAGNOSIS — J4520 Mild intermittent asthma, uncomplicated: Secondary | ICD-10-CM

## 2019-01-18 DIAGNOSIS — J3089 Other allergic rhinitis: Secondary | ICD-10-CM | POA: Diagnosis not present

## 2019-01-18 MED ORDER — CONTRAVE 8-90 MG PO TB12: 2.0000 | ORAL_TABLET | Freq: Two times a day (BID) | ORAL | 2 refills | Status: DC

## 2019-01-18 MED ORDER — ROSUVASTATIN CALCIUM 5 MG PO TABS: 5.0000 mg | ORAL_TABLET | Freq: Every day | ORAL | 1 refills | Status: DC

## 2019-01-18 MED ORDER — TRULICITY 1.5 MG/0.5ML ~~LOC~~ SOAJ: 1.5000 mg | SUBCUTANEOUS | 2 refills | Status: DC

## 2019-01-18 NOTE — Progress Notes (Signed)
Name: Jasmine Woods   MRN: [id number]    DOB: Apr 25, 1956   Date:01/18/2019       Progress Note  Subjective  Chief Complaint  Chief Complaint  Patient presents with  . Medication Refill  . Diabetes    Checks daily Average-90-100 Fasting  . Asthma  . Hyperlipidemia  . Seasonal Allergies  . Myasthenia Gravis    HPI  DMII:She was off medication afterbariatric surgery in 2014,but we re-started medication 94/7096 - Trulicity,she denies polyphagia, polydipsia or polyuria. She has been more active, walking at least5000 steps daily. hgbA1C from 5.6% to 5.9% down to 5.7%and up to 5.8%,5.6%,5.5% back in June 2020  glucose at home has been at goal Neuropathy is still 2/10, constant, burning-like on both feet, she is on Cymbalta. She could not tolerate gabapentin or Lyrica and also did not work. Doing well except for the weight gain  Hyperlipidemia: takingCrestor, taking Co-Q 10 andhas noticed improvement ofmuscle aches, no chest pain or SOB. Last lipid panel was a to goal, LDL was 99 back in 11/2018   Asthma:she was seen by Dr. Felicie Morn and PFT, she was placed on Breo and seems to work well for her, she has SOB with activity that may be multifactorial, she has been using rescue inhaler about once a month. No cough or wheezing.   Myasthenia Gravis: diagnosed at [facility] by Dr. Burnett Harry, 09/2015, seronegative, only causing ptosis and double vision, no other symptoms. She had a negative chest CT for thymoma, and has started on medication ( Mestinon ), she states it works quickly but wears out very fast. She takes before driving to and until she gets home, takes 4 daily. Does not usually bother her to have double vision when at home.  Chronic Tension Headaches: seen by Dr. Melrose Nakayama, she was  taking Topamax 100 mg but stopped because of mental fogginess and is doing well even off medication, no problems at this time    Abnormal CT thyroid: incidental finding of thyroid  calcification of left lobe, she saw Endo and had negative biopsy., she had an US done one year ago, accidentally given levothyroxine by endo and caused TSH suppression,seen by Dr. Loanne Drilling , and recent visit back in June showed stability and is still being monitored   Bariatric Surgery/Obesity: she had bariatric surgery back in 2014 , her weight was almost 300 lbs, went down to close to 200lbs, butwasgradually gaining weight,she was started on Trulicity 28/3662 and HUTM54 lbs, but is gaining it back, she is now at 241 lbs. She has not been as active and snacking more since COVID-19   OSA: she has a new machine, rx written by Dr. Felicie Morn and she states she has been compliant and is doing well   Excessive sweating: she had a hysterectomy in her 60's took HRT until her 26's, was doing well, but hadnoticed for that over the past 37months excessive sweating all over her body, not associated with chest pain or SOB or fatigue, but happens with any form of physical activity never at rest or while sleeping. She states walking the dog, folding clothes, washing dishes, anything that requires movement. She was seen for Dr. Saunders Revel, had a normal Echo and stress test. It may be secondary to weight gain  Seasonal allergies: she has itchy nose and eyes, scratchy throat. Also clear rhinorrhea. She is compliant with nasal spray and claritin   Patient Active Problem List   Diagnosis Date Noted  . Multinodular goiter 11/25/2018  . Essential hypertension  11/11/2018  . DOE (dyspnea on exertion) 04/15/2018  . Hyperlipidemia 12/09/2017  . Diaphoresis 11/26/2017  . Special screening for malignant neoplasms, colon   . Epiretinal membrane (ERM) of left eye 09/09/2016  . Glaucoma, left eye 09/05/2016  . Seronegative myasthenia gravis ([place]) 08/08/2015  . Psoriasis 07/06/2015  . Stress incontinence 07/06/2015  . Chronic tension-type headache, intractable 05/29/2015  . Anxiety and depression 04/12/2015  .  Asthma, mild intermittent 04/12/2015  . Bladder polyp 04/12/2015  . Carpal tunnel syndrome 04/12/2015  . Binocular vision disorder with diplopia 04/12/2015  . Dyslipidemia 04/12/2015  . H/O: HTN (hypertension) 04/12/2015  . Hemorrhoids, internal 04/12/2015  . Neuropathy 04/12/2015  . Nuclear sclerotic cataract 04/12/2015  . Morbid obesity ([place]) 04/12/2015  . Obstructive apnea 04/12/2015  . Perennial allergic rhinitis 04/12/2015  . 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 Type 2 diabetes, controlled, with neuropathy ([place]) 04/12/2015  . Vitamin D deficiency 04/12/2015  . Cephalalgia 08/16/2014  . Lump or mass in breast 06/14/2013  . Bariatric surgery status 10/12/2012    Past Surgical History:  Procedure Laterality Date  . ABDOMINAL HYSTERECTOMY  1991  . [place] SURGERY  2014  . CATARACT EXTRACTION W/PHACO Left 01/28/2017   Procedure: CATARACT EXTRACTION PHACO AND INTRAOCULAR LENS PLACEMENT (IOC);  Surgeon: Birder Robson, MD;  Location: [facility];  Service: Ophthalmology;  Laterality: Left;  [place] 00:37 AP% 17.8 CDE 6.61 Fluid pack lot # [id number]  . COLONOSCOPY  2012  . COLONOSCOPY WITH PROPOFOL N/A 09/30/2016   Procedure: COLONOSCOPY WITH PROPOFOL;  Surgeon: Lucilla Lame, MD;  Location: [place];  Service: Endoscopy;  Laterality: N/A;  diabetic - diet controlled sleep apnea  . KNEE SURGERY Right 1979-1980  . OOPHORECTOMY    . PARS PLANA VITRECTOMY Left 03/27/2016   Procedure: PARS PLANA VITRECTOMY WITH 25 GAUGE AND MEMBRANE PEEL, INTERNAL LIMITING MEMBRANE;  Surgeon: Hurman Horn, MD;  Location: [place];  Service: Ophthalmology;  Laterality: Left;  . THYROID SURGERY     BX done was benign  . UPPER GI ENDOSCOPY    . VITRECTOMY Left 04/27/2016    Family History  Problem Relation Age of Onset  . Diabetes Mother   . Hypertension Mother   . Heart disease Mother 61       stent x 1   . Migraines Sister   . Stroke Maternal Grandfather   .  Diabetes Paternal Grandfather   . Muscular dystrophy Sister   . Heart attack Maternal Aunt   . Breast cancer Neg Hx   . Thyroid disease Neg Hx     Social History   Socioeconomic History  . Marital status: Married    Spouse name: Gwyndolyn Saxon "Butch"  . Number of children: 2  . Years of education: Not on file  . Highest education level: Associate degree: occupational, Hotel manager, or vocational program  Occupational History  . Not on file  Social Needs  . Financial resource strain: Not hard at all  . Food insecurity    Worry: Never true    Inability: Never true  . Transportation needs    Medical: No    Non-medical: No  Tobacco Use  . Smoking status: Former Smoker    Packs/day: 0.50    Years: 3.00    Pack years: 1.50    Start date: 06/24/1972    Quit date: 06/25/1975    Years since quitting: 43.5  . Smokeless tobacco: Never Used  . Tobacco comment: smoked as teenager  Substance and  Sexual Activity  . Alcohol use: No    Alcohol/week: 0.0 standard drinks  . Drug use: No  . Sexual activity: Not Currently    Partners: Male  Lifestyle  . Physical activity    Days per week: 7 days    Minutes per session: 20 min  . Stress: Not at all  Relationships  . Social connections    Talks on phone: More than three times a week    Gets together: More than three times a week    Attends religious service: More than 4 times per year    Active member of club or organization: Yes    Attends meetings of clubs or organizations: 1 to 4 times per year    Relationship status: Married  . Intimate partner violence    Fear of current or ex partner: No    Emotionally abused: No    Physically abused: No    Forced sexual activity: No  Other Topics Concern  . Not on file  Social History Narrative  . Not on file     Current Outpatient Medications:  .  ACCU-CHEK GUIDE test strip, USE AS DIRECTED, Disp: 100 each, Rfl: 12 .  acetaminophen (TYLENOL) 500 MG tablet, Take 1,000 mg by mouth every 6 (six)  hours as needed for mild pain. Reported on 07/06/2015, Disp: , Rfl:  .  brimonidine-timolol (COMBIGAN) 0.2-0.5 % ophthalmic solution, , Disp: , Rfl:  .  budesonide (RHINOCORT AQUA) 32 MCG/ACT nasal spray, Place 2 sprays into both nostrils at bedtime., Disp: 15 mL, Rfl: 2 .  Coenzyme Q10 (CO Q 10) 100 MG CAPS, Take 1 capsule by mouth daily., Disp: 30 capsule, Rfl: 2 .  Dulaglutide (TRULICITY) 1.5 LD/3.5TS SOPN, Inject 1.5 mg into the skin once a week., Disp: 4 pen, Rfl: 2 .  DULoxetine (CYMBALTA) 60 MG capsule, Take 1 capsule (60 mg total) by mouth daily., Disp: 90 capsule, Rfl: 1 .  fluticasone furoate-vilanterol (BREO ELLIPTA) 200-25 MCG/INH AEPB, Inhale 1 puff into the lungs daily. Rinse mouth after use., Disp: 1 each, Rfl: 5 .  latanoprost (XALATAN) 0.005 % ophthalmic solution, , Disp: , Rfl:  .  loratadine (CLARITIN) 10 MG tablet, Take 1 tablet (10 mg total) by mouth 2 (two) times daily. (Patient taking differently: Take 10 mg by mouth daily. ), Disp: 60 tablet, Rfl: 0 .  montelukast (SINGULAIR) 10 MG tablet, Take 1 tablet (10 mg total) by mouth at bedtime., Disp: 90 tablet, Rfl: 1 .  Multiple Vitamins-Minerals (MULTIVITAMIN WITH MINERALS) tablet, Take 1 tablet by mouth daily., Disp: , Rfl:  .  Omega-3 Fatty Acids (FISH OIL) 1200 MG CAPS, Take 2 capsules by mouth daily., Disp: , Rfl:  .  pyridostigmine (MESTINON) 60 MG tablet, Take 60 mg by mouth 4 (four) times daily. 3 TO 4 TIMES DAILY, Disp: , Rfl: 4 .  rosuvastatin (CRESTOR) 5 MG tablet, Take 1 tablet (5 mg total) by mouth daily., Disp: 90 tablet, Rfl: 1 .  telmisartan-hydrochlorothiazide (MICARDIS HCT) 80-25 MG tablet, Take 1 tablet by mouth daily., Disp: 90 tablet, Rfl: 3 .  VENTOLIN HFA 108 (90 Base) MCG/ACT inhaler, INHALE 2 PUFFS BY MOUTH EVERY 4 HOURS AS NEEDED SHORTNESS OF BREATH/WHEEZING, Disp: 18 g, Rfl: 0  Allergies  Allergen Reactions  . Aspirin Swelling    Upset stomach  Upset stomach   . Azithromycin     Contraindicated in  Myasthenia   . Ace Inhibitors     cough  . Amlodipine  Edema of feet and hands  . Gabapentin     anxiety  . Pregabalin Other (See Comments)    anxiety    I personally reviewed active problem list, medication list, allergies, family history, social history with the patient/caregiver today.   ROS   Constitutional: Negative for fever, positive for  weight change.  Respiratory: Negative for cough and shortness of breath.   Cardiovascular: Negative for chest pain or palpitations.  Gastrointestinal: Negative for abdominal pain, no bowel changes.  Musculoskeletal: Negative for gait problem or joint swelling.  Skin: Negative for rash.  Neurological: Negative for dizziness or headache.  No other specific complaints in a complete review of systems (except as listed in HPI above).  Objective  Vitals:   01/18/19 1513  BP: 138/88  Pulse: 90  Resp: 16  Temp: (!) 97 F (36.1 C)  TempSrc: Temporal  SpO2: 99%  Weight: 241 lb (109.3 kg)  Height: 5' 2.25" (1.581 m)    Body mass index is 43.73 kg/m.  Physical Exam  Constitutional: Patient appears well-developed and well-nourished. Obese  No distress.  HEENT: head atraumatic, normocephalic, pupils equal and reactive to light, neck supple Cardiovascular: Normal rate, regular rhythm and normal heart sounds.  No murmur heard. No BLE edema. Pulmonary/Chest: Effort normal and breath sounds normal. No respiratory distress. Abdominal: Soft.  There is no tenderness. Psychiatric: Patient has a normal mood and affect. behavior is normal. Judgment and thought content normal.  Recent Results (from the past 2160 hour(s))  CBC w/Diff     Status: None   Collection Time: 10/29/18  2:58 PM  Result Value Ref Range   WBC 10.5 4.0 - 10.5 K/uL   RBC 4.51 3.87 - 5.11 MIL/uL   Hemoglobin 13.6 12.0 - 15.0 g/dL   HCT 39.7 36.0 - 46.0 %   MCV 88.0 80.0 - 100.0 fL   MCH 30.2 26.0 - 34.0 pg   MCHC 34.3 30.0 - 36.0 g/dL   RDW 12.6 11.5 - 15.5 %    Platelets 295 150 - 400 K/uL   nRBC 0.0 0.0 - 0.2 %   Neutrophils Relative % 57 %   Neutro Abs 6.0 1.7 - 7.7 K/uL   Lymphocytes Relative 35 %   Lymphs Abs 3.7 0.7 - 4.0 K/uL   Monocytes Relative 6 %   Monocytes Absolute 0.7 0.1 - 1.0 K/uL   Eosinophils Relative 1 %   Eosinophils Absolute 0.1 0.0 - 0.5 K/uL   Basophils Relative 1 %   Basophils Absolute 0.1 0.0 - 0.1 K/uL   Immature Granulocytes 0 %   Abs Immature Granulocytes 0.03 0.00 - 0.07 K/uL    Comment: Performed at [facility], [address], [place], [place] [address]  IgE     Status: None   Collection Time: 10/29/18  2:58 PM  Result Value Ref Range   IgE (Immunoglobulin E), Serum 18 6 - 495 IU/mL    Comment: (NOTE) Performed At: [facility] [address] [place], [place] [id number] Rush Farmer MD [id number]   HM DIABETES EYE EXAM     Status: None   Collection Time: 11/11/18 12:00 AM  Result Value Ref Range   HM Diabetic Eye Exam No Retinopathy No Retinopathy    Comment: Dr. George Ina, [place] metabolic panel     Status: None   Collection Time: 11/19/18 10:02 AM  Result Value Ref Range   Sodium 139 135 - 145 mmol/L   Potassium 4.0 3.5 -  5.1 mmol/L   Chloride 102 98 - 111 mmol/L   CO2 27 22 - 32 mmol/L   Glucose, Bld 94 70 - 99 mg/dL   BUN 17 8 - 23 mg/dL   Creatinine, Ser 0.56 0.44 - 1.00 mg/dL   Calcium 9.3 8.9 - 10.3 mg/dL   GFR calc non Af Amer >60 >60 mL/min   GFR calc Af Amer >60 >60 mL/min   Anion gap 10 5 - 15    Comment: Performed at [facility], [address] [place], [place] [address]  Lipid panel     Status: None   Collection Time: 11/27/18 10:34 AM  Result Value Ref Range   Cholesterol 184 <200 mg/dL   HDL 67 > OR = 50 mg/dL   Triglycerides 90 <150 mg/dL   LDL Cholesterol (Calc) 99 mg/dL (calc)    Comment: Reference range: <100 . Desirable range <100 mg/dL for primary prevention;   <70 mg/dL for patients with CHD or diabetic  patients  with > or = 2 CHD risk factors. Marland Kitchen LDL-C is now calculated using the Martin-Hopkins  calculation, which is a validated novel method providing  better accuracy than the Friedewald equation in the  estimation of LDL-C.  Cresenciano Genre et al. Annamaria Helling. 6045;409(81): 2061-2068  (http://education.[employer].com/faq/FAQ164)    Total CHOL/HDL Ratio 2.7 <5.0 (calc)   Non-HDL Cholesterol (Calc) 117 <130 mg/dL (calc)    Comment: For patients with diabetes plus 1 major ASCVD risk  factor, treating to a non-HDL-C goal of <100 mg/dL  (LDL-C of <70 mg/dL) is considered a therapeutic  option.   Hemoglobin A1c     Status: None   Collection Time: 11/27/18 10:34 AM  Result Value Ref Range   Hgb A1c MFr Bld 5.5 <5.7 % of total Hgb    Comment: For the purpose of screening for the presence of diabetes: . <5.7%       Consistent with the absence of diabetes 5.7-6.4%    Consistent with increased risk for diabetes             (prediabetes) > or =6.5%  Consistent with diabetes . This assay result is consistent with a decreased risk of diabetes. . Currently, no consensus exists regarding use of hemoglobin A1c for diagnosis of diabetes in children. . According to American Diabetes Association (ADA) guidelines, hemoglobin A1c <7.0% represents optimal control in non-pregnant diabetic patients. Different metrics may apply to specific patient populations.  Standards of Medical Care in Diabetes(ADA). .    Mean Plasma Glucose 111 (calc)   eAG (mmol/L) 6.2 (calc)  TSH     Status: None   Collection Time: 11/27/18 10:34 AM  Result Value Ref Range   TSH 0.66 0.40 - 4.50 mIU/L  Novel Coronavirus, NAA (hospital order; send-out to ref lab)     Status: None   Collection Time: 12/14/18 12:42 PM   Specimen: Nasopharyngeal Swab; Respiratory  Result Value Ref Range   SARS-CoV-2, NAA NOT DETECTED NOT DETECTED    Comment: (NOTE) This test was developed and its performance characteristics determined by  [employer]. This test has not been FDA cleared or approved. This test has been authorized by FDA under an Emergency Use Authorization (EUA). This test is only authorized for the duration of time the declaration that circumstances exist justifying the authorization of the emergency use of in vitro diagnostic tests for detection of SARS-CoV-2 virus and/or diagnosis of COVID-19 infection under section 564(b)(1) of the Act, 21 U.S.C. 191YNW-2(N)(5), unless the authorization  is terminated or revoked sooner. When diagnostic testing is negative, the possibility of a false negative result should be considered in the context of a patient's recent exposures and the presence of clinical signs and symptoms consistent with COVID-19. An individual without symptoms of COVID-19 and who is not shedding SARS-CoV-2 virus would expect to have a negative (not detected) result in this assay. Performed  At: [facility] [address] [place], [place] [id number] Rush Farmer MD [id number]    Coronavirus Source NASOPHARYNGEAL     Comment: Performed at [facility], [address] Madelaine Bhat [place], [place] [address]      PHQ2/9: Depression screen [facility] 2/9 01/18/2019 11/27/2018 09/22/2018 07/31/2018 02/20/2018  Decreased Interest 0 1 0 0 0  Down, Depressed, Hopeless 0 0 0 0 0  PHQ - 2 Score 0 1 0 0 0  Altered sleeping 0 0 1 - 0  Tired, decreased energy 1 1 0 - 0  Change in appetite 1 1 0 - 0  Feeling bad or failure about yourself  0 0 0 - 0  Trouble concentrating 0 0 1 - 0  Moving slowly or fidgety/restless 0 0 0 - 0  Suicidal thoughts 0 0 0 - 0  PHQ-9 Score 2 3 2  - 0  Difficult doing work/chores - - Not difficult at all - -    phq 9 is negative  Fall Risk: Fall Risk  01/18/2019 09/22/2018 07/31/2018 05/25/2018 02/20/2018  Falls in the past year? 0 0 0 0 No  Number falls in past yr: 0 0 0 0 -  Injury with Fall? 0 0 0 0 -     Functional Status Survey: Is the patient deaf or have  difficulty hearing?: No Does the patient have difficulty seeing, even when wearing glasses/contacts?: Yes Does the patient have difficulty concentrating, remembering, or making decisions?: No Does the patient have difficulty walking or climbing stairs?: No Does the patient have difficulty dressing or bathing?: No Does the patient have difficulty doing errands alone such as visiting a doctor's office or shopping?: No    Assessment & Plan  1. Type 2 diabetes, controlled, with neuropathy (HCC)  - Urine Microalbumin w/creat. ratio - Dulaglutide (TRULICITY) 1.5 AS/5.0NL SOPN; Inject 1.5 mg into the skin once a week.  Dispense: 4 pen; Refill: 2  2. Dyslipidemia  - rosuvastatin (CRESTOR) 5 MG tablet; Take 1 tablet (5 mg total) by mouth daily.  Dispense: 90 tablet; Refill: 1  3. Bariatric surgery status  We will try weight loss medication   4. Dyslipidemia associated with type 2 diabetes mellitus ([place])   5. Seronegative myasthenia gravis ([place])   6. Multiple thyroid nodules   7. Hypertension, benign   8. Morbid obesity, unspecified obesity type ([place])  - Naltrexone-buPROPion HCl ER (CONTRAVE) 8-90 MG TB12; Take 2 tablets by mouth 2 (two) times a day.  Dispense: 120 tablet; Refill: 2  9. Perennial allergic rhinitis   10. Mild intermittent asthma without complication  stable

## 2019-01-19 LAB — MICROALBUMIN / CREATININE URINE RATIO
Creatinine, Urine: 60 mg/dL (ref 20–275)
Microalb Creat Ratio: 3 mcg/mg creat (ref <30)
Microalb, Ur: 0.2 mg/dL

## 2019-02-08 ENCOUNTER — Other Ambulatory Visit: Payer: Self-pay | Admitting: Family Medicine

## 2019-02-08 ENCOUNTER — Encounter: Payer: Self-pay | Admitting: Family Medicine

## 2019-02-08 DIAGNOSIS — G7000 Myasthenia gravis without (acute) exacerbation: Secondary | ICD-10-CM

## 2019-02-14 DIAGNOSIS — G4733 Obstructive sleep apnea (adult) (pediatric): Secondary | ICD-10-CM | POA: Diagnosis not present

## 2019-02-24 ENCOUNTER — Encounter: Payer: Self-pay | Admitting: Neurology

## 2019-02-26 ENCOUNTER — Telehealth (INDEPENDENT_AMBULATORY_CARE_PROVIDER_SITE_OTHER): Payer: 59 | Admitting: Neurology

## 2019-02-26 ENCOUNTER — Other Ambulatory Visit: Payer: Self-pay

## 2019-02-26 VITALS — Ht 62.0 in | Wt 233.0 lb

## 2019-02-26 DIAGNOSIS — G7000 Myasthenia gravis without (acute) exacerbation: Secondary | ICD-10-CM | POA: Diagnosis not present

## 2019-02-26 DIAGNOSIS — G5603 Carpal tunnel syndrome, bilateral upper limbs: Secondary | ICD-10-CM | POA: Diagnosis not present

## 2019-02-26 MED ORDER — PYRIDOSTIGMINE BROMIDE 60 MG PO TABS: ORAL_TABLET | ORAL | 3 refills | Status: DC

## 2019-02-26 NOTE — Progress Notes (Signed)
New Patient Virtual Visit via Video Note The purpose of this virtual visit is to provide medical care while limiting exposure to the novel coronavirus.    Consent was obtained for video visit:  Yes.   Answered questions that patient had about telehealth interaction:  Yes.   I discussed the limitations, risks, security and privacy concerns of performing an evaluation and management service by telemedicine. I also discussed with the patient that there may be a patient responsible charge related to this service. The patient expressed understanding and agreed to proceed.  Pt location: Home Physician Location: office Name of referring provider:  Steele Sizer, MD I connected with [name] at patients initiation/request on 02/26/2019 at  2:50 PM EDT by video enabled telemedicine application and verified that I am speaking with the correct person using two identifiers. Pt MRN:  [id number] Pt DOB:  08/24/1955 Video Participants:  [name]    History of Present Illness: Jasmine Woods is a 63 y.o. right-handed female with hyperlipidemia, asthma, and diet-controlled diabetes presenting to establish care for seronegative myasthenia gravis and bilateral hand paresthesias.  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   She was diagnosed with seronegative myasthenia gravis in 2017 with, diagnosed by single fiber EMG at [facility] and was followed there until October 2019.  Patient would like to transition her care within the cold network due to insurance reasons.  Symptoms started around 2015 with diplopia and ptosis.  She has never been hospitalized with myasthenia or develop limb weakness.  Symptoms are predominantly ocular.  She denies any slurred speech.  She takes mestinon 60mg  three times daily at 6:30a, 9am, and 3:30p.  She takes Mestinon mostly when she is at work and when driving.  She takes a extra dose as needed, which is usually a few times per month.   She has never been on any immunomodulatory therapy or steroids.  She has never been hospitalized with myasthenia.  For the past several years, she also has developed numbness and tingling of the hands.  She uses wrist splints nightly, which helps alleviate symptoms.  However, if she forgets to wear them, the numbness is very aggravating.  It is also worse when she is driving.  She has reduced grip strength.  She denies any neck pain.  Prior nerve conduction study/EMG in 2017 was limited as indication was for myasthenia gravis.  Electrodiagnostic testing with single-fiber EMG demonstrated jitter in the left frontalis and left extensor digitorum communis muscles.  Out-side paper records, electronic medical record, and images have been reviewed where available and summarized as:   NCS/EMG 07/31/2015: This abnormal study demonstrate evidence of an abnormality of neuromuscular junction transmission, consistent with a clinical diagnosis of myasthenia gravis.  There is no electrophysiologic evidence of a myopathy or neuropathy.   Lab Results  Component Value Date   HGBA1C 5.5 11/27/2018   Lab Results  Component Value Date   VITAMINB12 1,161 (H) 09/05/2016   Lab Results  Component Value Date   TSH 0.66 11/27/2018   No results found for: ESRSEDRATE, POCTSEDRATE  Past Medical History:  Diagnosis Date   Allergy    Anxiety    Asthma    Bladder polyp    Dr. Ernst Spell   Carpal tunnel syndrome    Depression    Diabetes mellitus without complication ([place])    Diet controlled   Double vision    seen by Dr. 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 myastenia gravis   Dyslipidemia  Frequency of urination    Glaucoma, left eye    H/O myasthenia gravis    MOSTLY AFFECTS EYES   HA (headache)    Heart murmur    followed by PCP   Hx of bariatric surgery    Hypertension    no meds   Internal hemorrhoids    Myasthenia gravis ([place])    "mostly effects eyes"   Neuropathy, generalized    diabetic  on feet   Nuclear sclerotic cataract of both eyes    Obesity (BMI 30-39.9)    Obstructive sleep apnea on CPAP    uses cpap   Patient is Jehovah's Witness    no blood products   Rotator cuff tendonitis 07/2009   also has impingment right shoulder pain, seen by Dr. Mack Guise   Sleep apnea    Thyroid nodule    Vaginal dryness, menopausal    Vitamin D deficiency     Past Surgical History:  Procedure Laterality Date   ABDOMINAL HYSTERECTOMY  1991   BARIATRIC SURGERY  2014   CATARACT EXTRACTION W/PHACO Left 01/28/2017   Procedure: CATARACT EXTRACTION PHACO AND INTRAOCULAR LENS PLACEMENT (IOC);  Surgeon: Birder Robson, MD;  Location: [facility];  Service: Ophthalmology;  Laterality: Left;  [place] 00:37 AP% 17.8 CDE 6.61 Fluid pack lot # [id number]   COLONOSCOPY  2012   COLONOSCOPY WITH PROPOFOL N/A 09/30/2016   Procedure: COLONOSCOPY WITH PROPOFOL;  Surgeon: Lucilla Lame, MD;  Location: [place];  Service: Endoscopy;  Laterality: N/A;  diabetic - diet controlled sleep apnea   KNEE SURGERY Right 1979-1980   OOPHORECTOMY     PARS PLANA VITRECTOMY Left 03/27/2016   Procedure: PARS PLANA VITRECTOMY WITH 25 GAUGE AND MEMBRANE PEEL, INTERNAL LIMITING MEMBRANE;  Surgeon: Hurman Horn, MD;  Location: [place];  Service: Ophthalmology;  Laterality: Left;   THYROID SURGERY     BX done was benign   UPPER GI ENDOSCOPY     VITRECTOMY Left 04/27/2016     Medications:  Outpatient Encounter Medications as of 02/26/2019  Medication Sig Note   ACCU-CHEK GUIDE test strip USE AS DIRECTED    acetaminophen (TYLENOL) 500 MG tablet Take 1,000 mg by mouth every 6 (six) hours as needed for mild pain. Reported on 07/06/2015    brimonidine-timolol (COMBIGAN) 0.2-0.5 % ophthalmic solution     budesonide (RHINOCORT AQUA) 32 MCG/ACT nasal spray Place 2 sprays into both nostrils at bedtime.    Coenzyme Q10 (CO Q 10) 100 MG CAPS Take 1 capsule by mouth daily.    Dulaglutide (TRULICITY)  1.5 0000000 SOPN Inject 1.5 mg into the skin once a week.    DULoxetine (CYMBALTA) 60 MG capsule Take 1 capsule (60 mg total) by mouth daily.    fluticasone furoate-vilanterol (BREO ELLIPTA) 200-25 MCG/INH AEPB Inhale 1 puff into the lungs daily. Rinse mouth after use.    latanoprost (XALATAN) 0.005 % ophthalmic solution     loratadine (CLARITIN) 10 MG tablet Take 1 tablet (10 mg total) by mouth 2 (two) times daily. (Patient taking differently: Take 10 mg by mouth daily. )    montelukast (SINGULAIR) 10 MG tablet Take 1 tablet (10 mg total) by mouth at bedtime.    Multiple Vitamins-Minerals (MULTIVITAMIN WITH MINERALS) tablet Take 1 tablet by mouth daily.    Naltrexone-buPROPion HCl ER (CONTRAVE) 8-90 MG TB12 Take 2 tablets by mouth 2 (two) times a day.    Omega-3 Fatty Acids (FISH OIL) 1200 MG CAPS Take 2 capsules by mouth daily.  pyridostigmine (MESTINON) 60 MG tablet Take 60 mg by mouth 4 (four) times daily. 3 TO 4 TIMES DAILY 09/15/2015: Received from: External Pharmacy   rosuvastatin (CRESTOR) 5 MG tablet Take 1 tablet (5 mg total) by mouth daily.    telmisartan-hydrochlorothiazide (MICARDIS HCT) 80-25 MG tablet Take 1 tablet by mouth daily.    VENTOLIN HFA 108 (90 Base) MCG/ACT inhaler INHALE 2 PUFFS BY MOUTH EVERY 4 HOURS AS NEEDED SHORTNESS OF BREATH/WHEEZING    No facility-administered encounter medications on file as of 02/26/2019.     Allergies:  Allergies  Allergen Reactions   Aspirin Swelling    Upset stomach  Upset stomach    Azithromycin     Contraindicated in Myasthenia    Ace Inhibitors     cough   Amlodipine     Edema of feet and hands   Gabapentin     anxiety   Pregabalin Other (See Comments)    anxiety    Family History: Family History  Problem Relation Age of Onset   Diabetes Mother    Hypertension Mother    Heart disease Mother 50       stent x 1    Migraines Sister    Stroke Maternal Grandfather    Diabetes Paternal Grandfather     Muscular dystrophy Sister    Heart attack Maternal Aunt    Breast cancer Neg Hx    Thyroid disease Neg Hx     Social History: Social History   Tobacco Use   Smoking status: Former Smoker    Packs/day: 0.50    Years: 3.00    Pack years: 1.50    Start date: 06/24/1972    Quit date: 06/25/1975    Years since quitting: 43.7   Smokeless tobacco: Never Used   Tobacco comment: smoked as teenager  Substance Use Topics   Alcohol use: No    Alcohol/week: 0.0 standard drinks   Drug use: No   Social History   Social History Narrative   Right handed   One story home   12 years education   Clerical workers [employer]    Lives with husband    Review of Systems:  CONSTITUTIONAL: No fevers, chills, night sweats, or weight loss.   EYES: No visual changes or eye pain ENT: No hearing changes.  No history of nose bleeds.   RESPIRATORY: No cough, wheezing and shortness of breath.   CARDIOVASCULAR: Negative for chest pain, and palpitations.   GI: Negative for abdominal discomfort, blood in stools or black stools.  No recent change in bowel habits.   GU:  No history of incontinence.   MUSCLOSKELETAL: No history of joint pain or swelling.  No myalgias.   SKIN: Negative for lesions, rash, and itching.   HEMATOLOGY/ONCOLOGY: Negative for prolonged bleeding, bruising easily, and swollen nodes.  No history of cancer.   ENDOCRINE: Negative for cold or heat intolerance, polydipsia or goiter.   PSYCH:  No depression or anxiety symptoms.   NEURO: As Above.   Vital Signs:  Ht 5\' 2"  (1.575 m)    Wt 233 lb (105.7 kg)    BMI 42.62 kg/m    General Medical Exam:  Well appearing, comfortable.  Nonlabored breathing.  No deformity or edema.  No rash.  Neurological Exam: MENTAL STATUS including orientation to time, place, person, recent and remote memory, attention span and concentration, language, and fund of knowledge is normal.  Speech is not dysarthric.  CRANIAL NERVES:  Normal conjugate,  extra-ocular eye movements  in all directions of gaze, except with sustained uppercase there is mild medial deviation of the left eye.  Mild bilateral ptosis at rest and worse with sustained upgaze.  Normal facial symmetry and movements.  Normal shoulder shrug and head rotation.  Tongue is midline.  MOTOR:  Antigravity in all extremities.  No abnormal movements.  No pronator drift.  SENSORY & REFLEXES: Unable to assess  COORDINATION/GAIT: Normal finger to nose bilaterally.  Intact rapid alternating movements bilaterally.  Able to rise from a chair without using arms.  Gait narrow based and stable.    IMPRESSION: 1.  Seronegative ocular myasthenia gravis without exacerbation, diagnosed 2017 with single-fiber EMG.  Clinically, she has intermittent diplopia throughout the day, symptoms are well controlled on Mestinon 60 mg 3 times daily.  She has not been on prednisone or other immunotherapy.  In order to give her the most benefit from Mestinon, I recommend that she adjust the dose to Mestinon 60 mg at 6:30 AM, 11 AM, and 3:30 PM.  This will allow for increased therapeutic benefit throughout the day.  She can continue to take an extra dose of Mestinon as needed.  2.  Bilateral hand paresthesias, suspect she has carpal tunnel syndrome  She will return to the clinic for electrodiagnostic testing of the bilateral upper extremities    Follow Up Instructions:  I discussed the assessment and treatment plan with the patient. The patient was provided an opportunity to ask questions and all were answered. The patient agreed with the plan and demonstrated an understanding of the instructions.   The patient was advised to call back or seek an in-person evaluation if the symptoms worsen or if the condition fails to improve as anticipated.  Total Time spent:  50 min   Alda Berthold, DO

## 2019-03-17 DIAGNOSIS — G4733 Obstructive sleep apnea (adult) (pediatric): Secondary | ICD-10-CM | POA: Diagnosis not present

## 2019-03-29 ENCOUNTER — Other Ambulatory Visit: Payer: Self-pay | Admitting: Family Medicine

## 2019-04-08 ENCOUNTER — Encounter: Payer: Self-pay | Admitting: Neurology

## 2019-04-08 ENCOUNTER — Other Ambulatory Visit: Payer: Self-pay

## 2019-04-08 ENCOUNTER — Ambulatory Visit: Payer: 59 | Admitting: Internal Medicine

## 2019-04-08 ENCOUNTER — Ambulatory Visit (INDEPENDENT_AMBULATORY_CARE_PROVIDER_SITE_OTHER): Payer: 59 | Admitting: Neurology

## 2019-04-08 ENCOUNTER — Ambulatory Visit: Payer: 59 | Admitting: Neurology

## 2019-04-08 VITALS — BP 142/88 | HR 71 | Ht 62.0 in | Wt 233.0 lb

## 2019-04-08 DIAGNOSIS — G250 Essential tremor: Secondary | ICD-10-CM | POA: Diagnosis not present

## 2019-04-08 DIAGNOSIS — G5603 Carpal tunnel syndrome, bilateral upper limbs: Secondary | ICD-10-CM | POA: Diagnosis not present

## 2019-04-08 DIAGNOSIS — G7000 Myasthenia gravis without (acute) exacerbation: Secondary | ICD-10-CM | POA: Diagnosis not present

## 2019-04-08 NOTE — Patient Instructions (Signed)
We will refer you to [facility].

## 2019-04-08 NOTE — Procedures (Signed)
[facility]  [address], [address]  [place], [place] [address] Tel: [phone] Fax:  [phone] Test Date:  123456  Patient: Jasmine Woods DOB: [birth year] Physician: Narda Amber, DO  Sex: Female Height: 5\' 2"  Ref Phys: Narda Amber, DO  ID#: [id number] Temp: 32.0C Technician:    Patient Complaints: This is a 63 year old female referred for evaluation of bilateral hand paresthesias, worse on the left.  NCV & EMG Findings: Extensive electrodiagnostic testing of the left upper extremity and additional studies of the right shows:  1. Left median sensory response showed prolonged distal peak latency (4.5 ms) and reduced amplitude (8.1 V).  Right mixed palmar sensory responses show prolonged latency (Median Palm, 2.4 ms).  Right median and bilateral ulnar sensory responses are within normal limits. 2. Bilateral median motor responses show prolonged latency (L4.3, R4.4 ms).  Bilateral ulnar motor responses are within normal limits.   3. Chronic motor axonal loss changes are seen affecting bilateral abductor pollicis brevis muscles, without accompanied active denervation.    Impression: Bilateral median neuropathy at or distal to the wrist, consistent with a clinical diagnosis of carpal tunnel syndrome.  Overall, these findings are moderate in degree electrically and worse on the left.   ___________________________ Narda Amber, DO    Nerve Conduction Studies Anti Sensory Summary Table   Site NR Peak (ms) Norm Peak (ms) P-T Amp (V) Norm P-T Amp  Left Median Anti Sensory (2nd Digit)  32C  Wrist    4.5 <3.8 8.1 >10  Right Median Anti Sensory (2nd Digit)  32C  Wrist    3.5 <3.8 19.2 >10  Left Ulnar Anti Sensory (5th Digit)  32C  Wrist    2.5 <3.2 29.5 >5  Right Ulnar Anti Sensory (5th Digit)  32C  Wrist    2.6 <3.2 25.9 >5   Motor Summary Table   Site NR Onset (ms) Norm Onset (ms) O-P Amp (mV) Norm O-P Amp Site1 Site2 Delta-0 (ms) Dist (cm) Vel (m/s)  Norm Vel (m/s)  Left Median Motor (Abd Poll Brev)  32C  Wrist    4.3 <4.0 8.4 >5 Elbow Wrist 4.6 25.0 54 >50  Elbow    8.9  7.9         Right Median Motor (Abd Poll Brev)  32C  Wrist    4.4 <4.0 7.7 >5 Elbow Wrist 3.9 24.0 62 >50  Elbow    8.3  7.4         Left Ulnar Motor (Abd Dig Minimi)  32C  Wrist    2.0 <3.1 10.5 >7 B Elbow Wrist 3.3 21.0 64 >50  B Elbow    5.3  9.4  A Elbow B Elbow 1.7 10.0 59 >50  A Elbow    7.0  8.5         Right Ulnar Motor (Abd Dig Minimi)  32C  Wrist    1.9 <3.1 9.3 >7 B Elbow Wrist 3.5 22.0 63 >50  B Elbow    5.4  8.5  A Elbow B Elbow 1.6 10.0 63 >50  A Elbow    7.0  7.9          Comparison Summary Table   Site NR Peak (ms) Norm Peak (ms) P-T Amp (V) Site1 Site2 Delta-P (ms) Norm Delta (ms)  Right Median/Ulnar Palm Comparison (Wrist - 8cm)  32C  Median Palm    2.4 <2.2 22.2 Median Palm Ulnar Palm 0.9   Ulnar Palm    1.5 <2.2 9.5  EMG   Side Muscle Ins Act Fibs Psw Fasc Number Recrt Dur Dur. Amp Amp. Poly Poly. Comment  Right 1stDorInt Nml Nml Nml Nml Nml Nml Nml Nml Nml Nml Nml Nml N/A  Right Abd Poll Brev Nml Nml Nml Nml 1- Rapid Many 1+ Many 1+ Some 1+ N/A  Right PronatorTeres Nml Nml Nml Nml Nml Nml Nml Nml Nml Nml Nml Nml N/A  Right Biceps Nml Nml Nml Nml Nml Nml Nml Nml Nml Nml Nml Nml N/A  Right Triceps Nml Nml Nml Nml Nml Nml Nml Nml Nml Nml Nml Nml N/A  Right Deltoid Nml Nml Nml Nml Nml Nml Nml Nml Nml Nml Nml Nml N/A  Left 1stDorInt Nml Nml Nml Nml Nml Nml Nml Nml Nml Nml Nml Nml N/A  Left Abd Poll Brev Nml Nml Nml Nml 1- Rapid Many 1+ Many 1+ Some 1+ N/A  Left PronatorTeres Nml Nml Nml Nml Nml Nml Nml Nml Nml Nml Nml Nml N/A  Left Biceps Nml Nml Nml Nml Nml Nml Nml Nml Nml Nml Nml Nml N/A  Left Triceps Nml Nml Nml Nml Nml Nml Nml Nml Nml Nml Nml Nml N/A  Left Deltoid Nml Nml Nml Nml Nml Nml Nml Nml Nml Nml Nml Nml N/A      Waveforms:

## 2019-04-08 NOTE — Progress Notes (Signed)
Follow-up Visit   Date: 123XX123   Jasmine Woods MRN: [id number] DOB: 1956/05/07   Interim History: Lynann Corro is a 63 y.o. right-handed Caucasian female with hyperlipidemia, asthma, and diet-controlled diabetes  returning to the clinic for follow-up of seronegative myasthenia gravis and bilateral hand paresthesias.  The patient was accompanied to the clinic by self.  History of present illness: She was diagnosed with seronegative myasthenia gravis in 2017 with, diagnosed by single fiber EMG at [facility] and was followed there until October 2019.  Patient would like to transition her care within the cold network due to insurance reasons.  Symptoms started around 2015 with diplopia and ptosis.  She has never been hospitalized with myasthenia or develop limb weakness.  Symptoms are predominantly ocular.  She denies any slurred speech.  She takes mestinon 60mg  three times daily at 6:30a, 9am, and 3:30p.  She takes Mestinon mostly when she is at work and when driving.  She takes a extra dose as needed, which is usually a few times per month.  She has never been on any immunomodulatory therapy or steroids.  She has never been hospitalized with myasthenia.  For the past several years, she also has developed numbness and tingling of the hands.  She uses wrist splints nightly, which helps alleviate symptoms.  However, if she forgets to wear them, the numbness is very aggravating.  It is also worse when she is driving.  She has reduced grip strength.  She denies any neck pain.  Prior nerve conduction study/EMG in 2017 was limited as indication was for myasthenia gravis.  Electrodiagnostic testing with single-fiber EMG demonstrated jitter in the left frontalis and left extensor digitorum communis muscles.  UPDATE 04/08/2019:  She is here for follow-up and EDX of the hands.  She is experiencing more intense numbness/tingling of the hands, especially at the end of the day.  She is  very compliant with using wrist splints at nighttime, but feels that it is less effective at controlling her symptoms.  She denies hand weakness and rarely drops objects.    Since adjusting the timing of her mestinon to 60mg  at 6:30a, 11a, and 3:30p, her double vision is less noticeable during the day.    She also complains of increased hand tremors over the past month.  It is only there when she is trying to use her hands, not at rest.  She does have family history of tremors.   Medications:  Current Outpatient Medications on File Prior to Visit  Medication Sig Dispense Refill  . ACCU-CHEK GUIDE test strip USE AS DIRECTED 100 each 12  . acetaminophen (TYLENOL) 500 MG tablet Take 1,000 mg by mouth every 6 (six) hours as needed for mild pain. Reported on 07/06/2015    . brimonidine-timolol (COMBIGAN) 0.2-0.5 % ophthalmic solution     . budesonide (RHINOCORT AQUA) 32 MCG/ACT nasal spray Place 2 sprays into both nostrils at bedtime. 15 mL 2  . Coenzyme Q10 (CO Q 10) 100 MG CAPS Take 1 capsule by mouth daily. 30 capsule 2  . Dulaglutide (TRULICITY) 1.5 0000000 SOPN Inject 1.5 mg into the skin once a week. 4 pen 2  . DULoxetine (CYMBALTA) 60 MG capsule Take 1 capsule (60 mg total) by mouth daily. 90 capsule 1  . fluticasone furoate-vilanterol (BREO ELLIPTA) 200-25 MCG/INH AEPB Inhale 1 puff into the lungs daily. Rinse mouth after use. 1 each 5  . latanoprost (XALATAN) 0.005 % ophthalmic solution     . loratadine (  CLARITIN) 10 MG tablet Take 1 tablet (10 mg total) by mouth 2 (two) times daily. (Patient taking differently: Take 10 mg by mouth daily. ) 60 tablet 0  . montelukast (SINGULAIR) 10 MG tablet Take 1 tablet (10 mg total) by mouth at bedtime. 90 tablet 1  . Multiple Vitamins-Minerals (MULTIVITAMIN WITH MINERALS) tablet Take 1 tablet by mouth daily.    . Naltrexone-buPROPion HCl ER (CONTRAVE) 8-90 MG TB12 Take 2 tablets by mouth 2 (two) times a day. 120 tablet 2  . Omega-3 Fatty Acids (FISH  OIL) 1200 MG CAPS Take 2 capsules by mouth daily.    Marland Kitchen pyridostigmine (MESTINON) 60 MG tablet Take 1 tablet at 6:30a, 11a, and 3:30p.  OK to take extra dose as needed. 360 tablet 3  . rosuvastatin (CRESTOR) 5 MG tablet Take 1 tablet (5 mg total) by mouth daily. 90 tablet 1  . telmisartan-hydrochlorothiazide (MICARDIS HCT) 80-25 MG tablet Take 1 tablet by mouth daily. 90 tablet 3  . VENTOLIN HFA 108 (90 Base) MCG/ACT inhaler INHALE 2 PUFFS BY MOUTH EVERY 4 HOURS AS NEEDED SHORTNESS OF BREATH/WHEEZING 18 g 0   No current facility-administered medications on file prior to visit.     Allergies:  Allergies  Allergen Reactions  . Aspirin Swelling    Upset stomach  Upset stomach   . Azithromycin     Contraindicated in Myasthenia   . Ace Inhibitors     cough  . Amlodipine     Edema of feet and hands  . Gabapentin     anxiety  . Pregabalin Other (See Comments)    anxiety    Review of Systems:  CONSTITUTIONAL: No fevers, chills, night sweats, or weight loss.  EYES: No visual changes or eye pain ENT: No hearing changes.  No history of nose bleeds.   RESPIRATORY: No cough, wheezing and shortness of breath.   CARDIOVASCULAR: Negative for chest pain, and palpitations.   GI: Negative for abdominal discomfort, blood in stools or black stools.  No recent change in bowel habits.   GU:  No history of incontinence.   MUSCLOSKELETAL: No history of joint pain or swelling.  No myalgias.   SKIN: Negative for lesions, rash, and itching.   ENDOCRINE: Negative for cold or heat intolerance, polydipsia or goiter.   PSYCH:  No depression or anxiety symptoms.   NEURO: As Above.   Vital Signs:  BP (!) 142/88   Pulse 71   Ht 5\' 2"  (1.575 m)   Wt 233 lb (105.7 kg)   SpO2 98%   BMI 42.62 kg/m   Neurological Exam: MENTAL STATUS including orientation to time, place, person, recent and remote memory, attention span and concentration, language, and fund of knowledge is normal.  Speech is not  dysarthric.  CRANIAL NERVES:  No visual field defects.  Pupils equal round and reactive to light.  Mildly restricted lateral gaze in the right eye, otherwise normal conjugate, extra-ocular eye movements in all directions of gaze.  No ptosis.  Face is symmetric.  Facial muscles are 5/5.    MOTOR:  Motor strength is 5/5 in all extremities, including bilateral ABP.  There is mild intention tremor bilaterally in the hands, no rest tremor.    MSRs:  Reflexes are 2+/4 throughout.  SENSORY:  Intact to pin prick throughout   COORDINATION/GAIT:  Normal finger-to- nose-finger.  Intact rapid alternating movements bilaterally.  Gait mildly-wide based due to body habitus, stable and unassisted  Data: NCS/EMG of the arms 04/07/2019:  Bilateral  median neuropathy at or distal to the wrist, consistent with a clinical diagnosis of carpal tunnel syndrome.  Overall, these findings are moderate in degree electrically and worse on the left.   IMPRESSION/PLAN: 1. Bilateral carpal tunnel syndrome, moderate and worse on the left.  No improvement despite excellent compliance with wrist splints.  Refer to [facility] for steroid injection and/or consideration for CTS release.   2.  Seronegative ocular myasthenia gravis with mild exacerbation, diagnosed 2017 with single-fiber EMG.  Exam shows mild ophthalmoplegia, no ptosis and intact facial muscles.  She is doing well on mestinon 60mg  at 6:30a, 11a, and 3:30p.  OK to take extra dose of mestinon as needed  3.  Benign essential tremor, mild.  Continue to monitor.  If symptoms begin to interfere with quality of life, consider adding medication  Return to clinic in 4 months.   Greater than 50% of this 25 minute visit was spent in counseling, explanation of diagnosis, planning of further management, and coordination of care.   Thank you for allowing me to participate in patient's care.  If I can answer any additional questions, I would be pleased to do so.     Sincerely,    Lynze Reddy K. Posey Pronto, DO

## 2019-04-16 DIAGNOSIS — G4733 Obstructive sleep apnea (adult) (pediatric): Secondary | ICD-10-CM | POA: Diagnosis not present

## 2019-04-20 ENCOUNTER — Ambulatory Visit: Payer: 59 | Admitting: Family Medicine

## 2019-04-20 NOTE — Progress Notes (Signed)
Cardiology Office Note    Date:  0000000   ID:  Lekha, Tupa 1956-03-31, MRN [id number]  PCP:  Steele Sizer, MD  Cardiologist:  Nelva Bush, MD  Electrophysiologist:  None   Chief Complaint: Follow-up  History of Present Illness:   Jasmine Woods is a 63 y.o. female with history of myasthenia gravis, DM, HTN, HLD, multinodular goiter, asthma, bilateral carpal tunnel syndrome, morbid obesity s/p bariatric surgery, and OSA who presents for follow up of exertional dyspnea.   Prior ETT for exertional dyspnea and diaphoresis in 05/2018 showed the patient exhibited decreased exercise capacity with significant SOB during stage I without angina reported. She demonstrated appropriate chronotropic response with hypertensive BP. Stress EKGs were limited due to motion artifact, though no significant st/t changes were noted. No significant arrhythmias were noted. Overall, this was a low to intermediate risk stress test. Echo in 07/2018 showed an EF of 60-65%, mildly increased LV cavity size, borderline LV wall thickness, normal diastolic function, normal RVSF and cavity size, RVSP 30.5 mmHg, no significant valvular abnormalities.  She was last seen virtually by Dr. Saunders Revel in 10/2018 and noted some improvement in her breathing following the addition of Breo by pulmonology. She did continue to note some exertional dyspnea as well as diaphoresis.  Further ischemic testing was deferred at that time given her symptoms were felt to be less likely ischemic in etiology and rather multifactorial including asthma, morbid obesity, sleep apnea, and physical deconditioning. Subsequent labs showed normal renal function and potassium leading her Micardis to be titrated in an effort to improve her BP.   She comes in doing reasonably well from a cardiac perspective.  She denies any chest pain, palpitations, dizziness, presyncope, or syncope.  No lower extremity swelling, abdominal distention,  orthopnea, PND, early satiety.  She continues to note diaphoresis with minimal activity.  She does report her last thyroid check showed she was borderline euthyroid/hypothyroid.  Her bilateral carpal tunnel syndrome has been progressing and she will see orthopedics later today.  She does have a history of shortness of breath though this has improved following the addition of Breo.  She continues to note fatigue, generalized malaise, constipation, and peripheral neuropathy.   Labs: 11/2018 - COVID-19 negative, TSH normal, A1c 5.5, TC 184, TG 90, HDL 67, LDL 99 10/2018 - potassium 4.0, BUN 17, SCr 0.56, HGB 13.6, PLT 295  Past Medical History:  Diagnosis Date  . Allergy   . Anxiety   . Asthma   . Bladder polyp    Dr. Ernst Spell  . Carpal tunnel syndrome   . Depression   . Diabetes mellitus without complication ([place])    Diet controlled  . Double vision    seen by Dr. 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 Dyslipidemia   . Frequency of urination   . Glaucoma, left eye   . H/O myasthenia gravis    MOSTLY AFFECTS EYES  . HA (headache)   . Heart murmur    followed by PCP  . Hx of bariatric surgery   . Hypertension    no meds  . Internal hemorrhoids   . Myasthenia gravis ([place])    "mostly effects eyes"  . Neuropathy, generalized    diabetic on feet  . Nuclear sclerotic cataract of both eyes   . Obesity (BMI 30-39.9)   . Obstructive sleep apnea on CPAP    uses cpap  . Patient is Jehovah's Witness    no blood products  . Rotator  cuff tendonitis 07/2009   also has impingment right shoulder pain, seen by Dr. Mack Guise  . Sleep apnea   . Thyroid nodule   . Vaginal dryness, menopausal   . Vitamin D deficiency     Past Surgical History:  Procedure Laterality Date  . ABDOMINAL HYSTERECTOMY  1991  . [place] SURGERY  2014  . CATARACT EXTRACTION W/PHACO Left 01/28/2017   Procedure: CATARACT EXTRACTION PHACO AND INTRAOCULAR LENS PLACEMENT (IOC);  Surgeon: Birder Robson, MD;   Location: [facility];  Service: Ophthalmology;  Laterality: Left;  [place] 00:37 AP% 17.8 CDE 6.61 Fluid pack lot # [id number]  . COLONOSCOPY  2012  . COLONOSCOPY WITH PROPOFOL N/A 09/30/2016   Procedure: COLONOSCOPY WITH PROPOFOL;  Surgeon: Lucilla Lame, MD;  Location: [place];  Service: Endoscopy;  Laterality: N/A;  diabetic - diet controlled sleep apnea  . KNEE SURGERY Right 1979-1980  . OOPHORECTOMY    . PARS PLANA VITRECTOMY Left 03/27/2016   Procedure: PARS PLANA VITRECTOMY WITH 25 GAUGE AND MEMBRANE PEEL, INTERNAL LIMITING MEMBRANE;  Surgeon: Hurman Horn, MD;  Location: [place];  Service: Ophthalmology;  Laterality: Left;  . THYROID SURGERY     BX done was benign  . UPPER GI ENDOSCOPY    . VITRECTOMY Left 04/27/2016    Current Medications: Current Meds  Medication Sig  . ACCU-CHEK GUIDE test strip USE AS DIRECTED  . acetaminophen (TYLENOL) 500 MG tablet Take 1,000 mg by mouth every 6 (six) hours as needed for mild pain. Reported on 07/06/2015  . brimonidine-timolol (COMBIGAN) 0.2-0.5 % ophthalmic solution   . budesonide (RHINOCORT AQUA) 32 MCG/ACT nasal spray Place 2 sprays into both nostrils at bedtime.  . Coenzyme Q10 (CO Q 10) 100 MG CAPS Take 1 capsule by mouth daily.  . Dulaglutide (TRULICITY) 1.5 0000000 SOPN Inject 1.5 mg into the skin once a week.  . DULoxetine (CYMBALTA) 60 MG capsule Take 1 capsule (60 mg total) by mouth daily.  . fluticasone furoate-vilanterol (BREO ELLIPTA) 200-25 MCG/INH AEPB Inhale 1 puff into the lungs daily. Rinse mouth after use.  . latanoprost (XALATAN) 0.005 % ophthalmic solution   . loratadine (CLARITIN) 10 MG tablet Take 1 tablet (10 mg total) by mouth 2 (two) times daily. (Patient taking differently: Take 10 mg by mouth daily. )  . montelukast (SINGULAIR) 10 MG tablet Take 1 tablet (10 mg total) by mouth at bedtime.  . Multiple Vitamins-Minerals (MULTIVITAMIN WITH MINERALS) tablet Take 1 tablet by mouth daily.  . Naltrexone-buPROPion HCl  ER (CONTRAVE) 8-90 MG TB12 Take 2 tablets by mouth 2 (two) times a day.  . Omega-3 Fatty Acids (FISH OIL) 1200 MG CAPS Take 2 capsules by mouth daily.  Marland Kitchen pyridostigmine (MESTINON) 60 MG tablet Take 1 tablet at 6:30a, 11a, and 3:30p.  OK to take extra dose as needed.  . rosuvastatin (CRESTOR) 5 MG tablet Take 1 tablet (5 mg total) by mouth daily.  Marland Kitchen telmisartan-hydrochlorothiazide (MICARDIS HCT) 80-25 MG tablet Take 1 tablet by mouth daily.  . VENTOLIN HFA 108 (90 Base) MCG/ACT inhaler INHALE 2 PUFFS BY MOUTH EVERY 4 HOURS AS NEEDED SHORTNESS OF BREATH/WHEEZING    Allergies:   Aspirin, Azithromycin, Ace inhibitors, Amlodipine, Gabapentin, and Pregabalin   Social History   Socioeconomic History  . Marital status: Married    Spouse name: Gwyndolyn Saxon "Butch"  . Number of children: 2  . Years of education: 38  . Highest education level: Associate degree: occupational, Hotel manager, or vocational program  Occupational History  . Occupation:  [employer]  Social Needs  . Financial resource strain: Not hard at all  . Food insecurity    Worry: Never true    Inability: Never true  . Transportation needs    Medical: No    Non-medical: No  Tobacco Use  . Smoking status: Former Smoker    Packs/day: 0.50    Years: 3.00    Pack years: 1.50    Start date: 06/24/1972    Quit date: 06/25/1975    Years since quitting: 43.8  . Smokeless tobacco: Never Used  . Tobacco comment: smoked as teenager  Substance and Sexual Activity  . Alcohol use: No    Alcohol/week: 0.0 standard drinks  . Drug use: No  . Sexual activity: Not Currently    Partners: Male  Lifestyle  . Physical activity    Days per week: 7 days    Minutes per session: 20 min  . Stress: Not at all  Relationships  . Social connections    Talks on phone: More than three times a week    Gets together: More than three times a week    Attends religious service: More than 4 times per year    Active member of club or organization: Yes     Attends meetings of clubs or organizations: 1 to 4 times per year    Relationship status: Married  Other Topics Concern  . Not on file  Social History Narrative   Right handed   One story home   12 years education   Clerical workers [employer]    Lives with husband     Family History:  The patient's family history includes Diabetes in her mother and paternal grandfather; Heart attack in her maternal aunt; Heart disease (age of onset: 36) in her mother; Hypertension in her mother; Migraines in her sister; Muscular dystrophy in her sister; Stroke in her maternal grandfather. There is no history of Breast cancer or Thyroid disease.  ROS:   Review of Systems  Constitutional: Positive for diaphoresis and malaise/fatigue. Negative for chills, fever and weight loss.  HENT: Negative for congestion.   Eyes: Negative for discharge and redness.  Respiratory: Positive for shortness of breath. Negative for cough, hemoptysis, sputum production and wheezing.   Cardiovascular: Negative for chest pain, palpitations, orthopnea, claudication, leg swelling and PND.  Gastrointestinal: Positive for constipation. Negative for abdominal pain, blood in stool, diarrhea, heartburn, melena, nausea and vomiting.  Genitourinary: Negative for hematuria.  Musculoskeletal: Positive for joint pain. Negative for falls and myalgias.       Bilateral wrist/hand  Skin: Negative for rash.  Neurological: Positive for tingling, sensory change and weakness. Negative for dizziness, tremors, speech change, focal weakness and loss of consciousness.  Endo/Heme/Allergies: Does not bruise/bleed easily.  Psychiatric/Behavioral: Negative for substance abuse. The patient is not nervous/anxious.      EKGs/Labs/Other Studies Reviewed:    Studies reviewed were summarized above. The additional studies were reviewed today: As above.    EKG:  EKG is ordered today.  The EKG ordered today demonstrates sinus bradycardia, 59 bpm, normal axis, no  acute ST-T changes  Recent Labs: 10/29/2018: Hemoglobin 13.6; Platelets 295 11/19/2018: BUN 17; Creatinine, Ser 0.56; Potassium 4.0; Sodium 139 11/27/2018: TSH 0.66  Recent Lipid Panel    Component Value Date/Time   CHOL 184 11/27/2018 1034   CHOL 175 03/05/2018 0807   CHOL 153 05/08/2012 0935   TRIG 90 11/27/2018 1034   TRIG 152 05/08/2012 0935   HDL 67 11/27/2018 1034  HDL 61 03/05/2018 0807   HDL 46 05/08/2012 0935   CHOLHDL 2.7 11/27/2018 1034   VLDL 23 09/05/2016 0940   VLDL 30 05/08/2012 0935   LDLCALC 99 11/27/2018 1034   LDLCALC 77 05/08/2012 0935    PHYSICAL EXAM:    VS:  BP 126/82 (BP Location: Left Arm, Patient Position: Sitting, Cuff Size: Normal)   Pulse (!) 59   Temp (!) 97.2 F (36.2 C)   Ht 5\' 2"  (1.575 m)   Wt 231 lb 8 oz (105 kg)   BMI 42.34 kg/m   BMI: Body mass index is 42.34 kg/m.  Physical Exam  Constitutional: She is oriented to person, place, and time. She appears well-developed and well-nourished.  HENT:  Head: Normocephalic and atraumatic.  Eyes: Right eye exhibits no discharge. Left eye exhibits no discharge.  Neck: Normal range of motion. No JVD present.  Cardiovascular: Normal rate, regular rhythm, S1 normal, S2 normal and normal heart sounds. Exam reveals no distant heart sounds, no friction rub, no midsystolic click and no opening snap.  No murmur heard. Pulses:      Posterior tibial pulses are 2+ on the right side and 2+ on the left side.  Pulmonary/Chest: Effort normal and breath sounds normal. No respiratory distress. She has no decreased breath sounds. She has no wheezes. She has no rales. She exhibits no tenderness.  Abdominal: Soft. She exhibits no distension. There is no abdominal tenderness.  Musculoskeletal:        General: No edema.  Neurological: She is alert and oriented to person, place, and time.  Skin: Skin is warm and dry. No cyanosis. Nails show no clubbing.  Psychiatric: She has a normal mood and affect. Her speech is  normal and behavior is normal. Judgment and thought content normal.    Wt Readings from Last 3 Encounters:  04/22/19 231 lb 8 oz (105 kg)  04/08/19 233 lb (105.7 kg)  02/24/19 233 lb (105.7 kg)     ASSESSMENT & PLAN:   1. Exertional dyspnea/diaphoresis: Her exertional dyspnea has significantly improved with the addition of Breo however she continues to note diaphoresis with minimal exertion.  Symptoms have been felt to be multifactorial including underlying asthma, morbid obesity, physical deconditioning, and sleep apnea.  Patient has been noted to have borderline LVH on recent echo and does have a history of bilateral carpal tunnel syndrome.  Associated symptoms of potential amyloidosis are very nonspecific including some symptoms in which the patient has such as constipation, shortness of breath, fatigue, weakness, and neuropathy.  Given her history of bilateral carpal tunnel and in the setting of her constellation of vague/nonspecific symptoms we have agreed to proceed with a cardiac MRI to evaluate for cardiac amyloidosis.  Recent ischemic evaluation as outlined above with further deferment at this time.  2. HTN: Blood pressure is improved on higher dose Micardis HCT.  She did not follow up with recommended BMP after the titration of Micardis.  In the setting, we will obtain this today to ensure stable renal function and potassium on higher dose.  3. Morbid obesity: Continued weight loss through diet and exercise is recommended.  Consider referral to nutritionist if not already obtained.  4. Bilateral carpal tunnel syndrome: Followed by neurology with referral to orthopedics given continued symptoms.  5. OSA: Compliant with CPAP.  Disposition: F/u with Dr. Saunders Revel or an APP in 2 months.   Medication Adjustments/Labs and Tests Ordered: Current medicines are reviewed at length with the patient today.  Concerns  regarding medicines are outlined above. Medication changes, Labs and Tests ordered  today are summarized above and listed in the Patient Instructions accessible in Encounters.   Signed, Christell Faith, PA-C 04/22/2019 10:33 AM     [facility] [address] [place] [address] [place], [place] [address] [phone]

## 2019-04-22 ENCOUNTER — Encounter: Payer: Self-pay | Admitting: Orthopaedic Surgery

## 2019-04-22 ENCOUNTER — Ambulatory Visit (INDEPENDENT_AMBULATORY_CARE_PROVIDER_SITE_OTHER): Payer: 59 | Admitting: Physician Assistant

## 2019-04-22 ENCOUNTER — Encounter: Payer: Self-pay | Admitting: Physician Assistant

## 2019-04-22 ENCOUNTER — Ambulatory Visit (INDEPENDENT_AMBULATORY_CARE_PROVIDER_SITE_OTHER): Payer: 59 | Admitting: Orthopaedic Surgery

## 2019-04-22 ENCOUNTER — Other Ambulatory Visit: Payer: Self-pay

## 2019-04-22 VITALS — BP 126/82 | HR 59 | Temp 97.2°F | Ht 62.0 in | Wt 231.5 lb

## 2019-04-22 DIAGNOSIS — R5383 Other fatigue: Secondary | ICD-10-CM | POA: Diagnosis not present

## 2019-04-22 DIAGNOSIS — G5602 Carpal tunnel syndrome, left upper limb: Secondary | ICD-10-CM | POA: Diagnosis not present

## 2019-04-22 DIAGNOSIS — G4733 Obstructive sleep apnea (adult) (pediatric): Secondary | ICD-10-CM | POA: Diagnosis not present

## 2019-04-22 DIAGNOSIS — I10 Essential (primary) hypertension: Secondary | ICD-10-CM

## 2019-04-22 DIAGNOSIS — R0602 Shortness of breath: Secondary | ICD-10-CM

## 2019-04-22 DIAGNOSIS — Z9989 Dependence on other enabling machines and devices: Secondary | ICD-10-CM

## 2019-04-22 DIAGNOSIS — G5603 Carpal tunnel syndrome, bilateral upper limbs: Secondary | ICD-10-CM

## 2019-04-22 DIAGNOSIS — G5601 Carpal tunnel syndrome, right upper limb: Secondary | ICD-10-CM

## 2019-04-22 DIAGNOSIS — R61 Generalized hyperhidrosis: Secondary | ICD-10-CM

## 2019-04-22 NOTE — Progress Notes (Signed)
Office Visit Note   Patient: Jasmine Woods           Date of Birth: 1956/03/23           MRN: [id number] Visit Date: 04/22/2019              Requested by: Alda Berthold, DO [facility] [place],  [place] [id number] PCP: Steele Sizer, MD   Assessment & Plan: Visit Diagnoses:  1. Carpal tunnel syndrome, left upper limb   2. Carpal tunnel syndrome, right upper limb     Plan: We talked about her diagnosis of carpal tunnel syndrome and what the anatomy of the transverse carpal ligament involves.  Also talked in detail about what carpal tunnel surgery involves.  The risk and benefits of surgery were discussed in detail.  She would like to consider having this scheduled however she was seen by cardiology today who is recommended some type of cardiac study that she is having on November 18.  She says that it was some type of cardiac MRI.  We will consider scheduling her surgery sometime after she has her cardiac work-up performed.  She is having this done due to recent worsening sweating that they feel may be cardiac related.  She has her surgery scheduler's card.  All question concerns were answered addressed.  If he gets to the point where she would like [place] to schedule surgery we could look at her cardiac notes from epic to see if she would be cleared for surgery such as this.  I also instructed her to discuss this with cardiology as well.  We would set her up first for a left open carpal tunnel release because this is the hand is more symptomatic.  Follow-Up Instructions: Return for 2 weeks post-op.   Orders:  No orders of the defined types were placed in this encounter.  No orders of the defined types were placed in this encounter.     Procedures: No procedures performed   Clinical Data: No additional findings.   Subjective: Chief Complaint  Patient presents with  . Left Hand - Pain  . Right Hand - Pain  Patient is a very pleasant  right-hand-dominant 63 year old female that I am seeing with a diagnosis of carpal tunnel syndrome bilaterally.  This is recently confirmed with nerve conduction studies showing moderate carpal tunnel syndrome involving both her left and right hands.  She says her left hand is worse than the right.  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  She has been dealing with worsening bilateral hand numbness and tingling and some weakness for some time now.  She is a type II diabetic but under excellent control with a hemoglobin A1c of below 6.  She also has a history of myasthenia gravis.  HPI  Review of Systems She currently denies any headache, chest pain, shortness of breath, fever, chills, nausea, vomiting  Objective: Vital Signs: There were no vitals taken for this visit.  Physical Exam She is alert and orient x3 and in no acute distress Ortho Exam Examination of both hands shows no muscle atrophy.  She has weak grip and pinch strength bilaterally.  She has positive Phalen's and Tinel's sign bilaterally.  Both hands are well-perfused.  Both wrists and hands have full range of motion of the wrist joints in all digits. Specialty Comments:  No specialty comments available.  Imaging: No results found.   PMFS History:  Patient Active Problem List   Diagnosis Date Noted  . Multinodular goiter 11/25/2018  . Essential hypertension 11/11/2018  . DOE (dyspnea on exertion) 04/15/2018  . Hyperlipidemia 12/09/2017  . Diaphoresis 11/26/2017  . Special screening for malignant neoplasms, colon   . Epiretinal membrane (ERM) of left eye 09/09/2016  . Glaucoma, left eye 09/05/2016  . Seronegative myasthenia gravis ([place]) 08/08/2015  . Psoriasis 07/06/2015  . Stress incontinence 07/06/2015  . Chronic tension-type headache, intractable 05/29/2015  . Anxiety and depression 04/12/2015  . Asthma, mild intermittent 04/12/2015  . Bladder polyp 04/12/2015  . Carpal  tunnel syndrome 04/12/2015  . Binocular vision disorder with diplopia 04/12/2015  . Dyslipidemia 04/12/2015  . H/O: HTN (hypertension) 04/12/2015  . Hemorrhoids, internal 04/12/2015  . Neuropathy 04/12/2015  . Nuclear sclerotic cataract 04/12/2015  . Morbid obesity ([place]) 04/12/2015  . Obstructive apnea 04/12/2015  . Perennial allergic rhinitis 04/12/2015  . 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 Type 2 diabetes, controlled, with neuropathy ([place]) 04/12/2015  . Vitamin D deficiency 04/12/2015  . Cephalalgia 08/16/2014  . Lump or mass in breast 06/14/2013  . Bariatric surgery status 10/12/2012   Past Medical History:  Diagnosis Date  . Allergy   . Anxiety   . Asthma   . Bladder polyp    Dr. Ernst Spell  . Carpal tunnel syndrome   . Depression   . Diabetes mellitus without complication ([place])    Diet controlled  . Double vision    seen by Dr. 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 Dyslipidemia   . Frequency of urination   . Glaucoma, left eye   . H/O myasthenia gravis    MOSTLY AFFECTS EYES  . HA (headache)   . Heart murmur    followed by PCP  . Hx of bariatric surgery   . Hypertension    no meds  . Internal hemorrhoids   . Myasthenia gravis ([place])    "mostly effects eyes"  . Neuropathy, generalized    diabetic on feet  . Nuclear sclerotic cataract of both eyes   . Obesity (BMI 30-39.9)   . Obstructive sleep apnea on CPAP    uses cpap  . Patient is Jehovah's Witness    no blood products  . Rotator cuff tendonitis 07/2009   also has impingment right shoulder pain, seen by Dr. Mack Guise  . Sleep apnea   . Thyroid nodule   . Vaginal dryness, menopausal   . Vitamin D deficiency     Family History  Problem Relation Age of Onset  . Diabetes Mother   . Hypertension Mother   . Heart disease Mother 72       stent x 1   . Migraines Sister   . Stroke Maternal Grandfather   . Diabetes Paternal Grandfather   . Muscular dystrophy Sister   .  Heart attack Maternal Aunt   . Breast cancer Neg Hx   . Thyroid disease Neg Hx     Past Surgical History:  Procedure Laterality Date  . ABDOMINAL HYSTERECTOMY  1991  . [place] SURGERY  2014  . CATARACT EXTRACTION W/PHACO Left 01/28/2017   Procedure: CATARACT EXTRACTION PHACO AND INTRAOCULAR LENS PLACEMENT (IOC);  Surgeon: Birder Robson, MD;  Location: [facility];  Service: Ophthalmology;  Laterality: Left;  [place] 00:37 AP% 17.8 CDE 6.61 Fluid pack lot # [id number]  . COLONOSCOPY  2012  . COLONOSCOPY WITH PROPOFOL N/A 09/30/2016   Procedure: COLONOSCOPY WITH PROPOFOL;  Surgeon: Lucilla Lame, MD;  Location:  [facility];  Service: Endoscopy;  Laterality: N/A;  diabetic - diet controlled sleep apnea  . KNEE SURGERY Right 1979-1980  . OOPHORECTOMY    . PARS PLANA VITRECTOMY Left 03/27/2016   Procedure: PARS PLANA VITRECTOMY WITH 25 GAUGE AND MEMBRANE PEEL, INTERNAL LIMITING MEMBRANE;  Surgeon: Hurman Horn, MD;  Location: [facility];  Service: Ophthalmology;  Laterality: Left;  . THYROID SURGERY     BX done was benign  . UPPER GI ENDOSCOPY    . VITRECTOMY Left 04/27/2016   Social History   Occupational History  . Occupation: [employer]  Tobacco Use  . Smoking status: Former Smoker    Packs/day: 0.50    Years: 3.00    Pack years: 1.50    Start date: 06/24/1972    Quit date: 06/25/1975    Years since quitting: 43.8  . Smokeless tobacco: Never Used  . Tobacco comment: smoked as teenager  Substance and Sexual Activity  . Alcohol use: No    Alcohol/week: 0.0 standard drinks  . Drug use: No  . Sexual activity: Not Currently    Partners: Male

## 2019-04-22 NOTE — Patient Instructions (Signed)
Medication Instructions:  Your physician recommends that you continue on your current medications as directed. Please refer to the Current Medication list given to you today.  *If you need a refill on your cardiac medications before your next appointment, please call your pharmacy*  Lab Work: Your physician recommends that you have lab work today(BMET)  If you have labs (blood work) drawn today and your tests are completely normal, you will receive your results only by: Marland Kitchen MyChart Message (if you have MyChart) OR . A paper copy in the mail If you have any lab test that is abnormal or we need to change your treatment, we will call you to review the results.  Testing/Procedures: 1- Cardiac MRI You are scheduled for Cardiac MRI on _____. Please arrive at the [facility] main entrance of [facility] at ___ (30-45 minutes prior to test start time). ?  [facility]  [address]  [place], [place] [address]  [phone]  Proceed to the [facility] Radiology Department (First Floor).  ?  Magnetic resonance imaging (MRI) is a painless test that produces images of the inside of the body without using X-rays. During an MRI, strong magnets and radio waves work together in a Research officer, political party to form detailed images. MRI images may provide more details about a medical condition than X-rays, CT scans, and ultrasounds can provide.  You may be given earphones to listen for instructions.  You may eat a light breakfast and take medications as ordered. If a contrast material will be used, an IV will be inserted into one of your veins. Contrast material will be injected into your IV.  You will be asked to remove all metal, including: Watch, jewelry, and other metal objects including hearing aids, hair pieces and dentures. (Braces and fillings normally are not a problem.)  If contrast material was used:  It will leave your body through your urine within a day. You may be told to drink  plenty of fluids to help flush the contrast material out of your system.  TEST WILL TAKE APPROXIMATELY 1 HOUR  PLEASE NOTIFY SCHEDULING AT LEAST 24 HOURS IN ADVANCE IF YOU ARE UNABLE TO KEEP YOUR APPOINTMENT. [phone]    Follow-Up: At [facility], you and your health needs are our priority.  As part of our continuing mission to provide you with exceptional heart care, we have created designated Provider Care Teams.  These Care Teams include your primary Cardiologist (physician) and Advanced Practice Providers (APPs -  Physician Assistants and Nurse Practitioners) who all work together to provide you with the care you need, when you need it.  Your next appointment:   2 months  The format for your next appointment:   In Person  Provider:     You may see Nelva Bush, MD or Christell Faith, PA-C.  Marrianne Mood, PA-C   Other Instructions

## 2019-04-23 LAB — BASIC METABOLIC PANEL
BUN/Creatinine Ratio: 26 (ref 12–28)
BUN: 17 mg/dL (ref 8–27)
CO2: 24 mmol/L (ref 20–29)
Calcium: 9.6 mg/dL (ref 8.7–10.3)
Chloride: 102 mmol/L (ref 96–106)
Creatinine, Ser: 0.65 mg/dL (ref 0.57–1.00)
GFR calc Af Amer: 109 mL/min/{1.73_m2} (ref >59)
GFR calc non Af Amer: 94 mL/min/{1.73_m2} (ref >59)
Glucose: 81 mg/dL (ref 65–99)
Potassium: 4.3 mmol/L (ref 3.5–5.2)
Sodium: 140 mmol/L (ref 134–144)

## 2019-05-05 ENCOUNTER — Ambulatory Visit: Payer: 59 | Admitting: Family Medicine

## 2019-05-05 ENCOUNTER — Encounter: Payer: Self-pay | Admitting: Family Medicine

## 2019-05-05 ENCOUNTER — Ambulatory Visit (INDEPENDENT_AMBULATORY_CARE_PROVIDER_SITE_OTHER): Payer: 59 | Admitting: Family Medicine

## 2019-05-05 ENCOUNTER — Other Ambulatory Visit: Payer: Self-pay

## 2019-05-05 DIAGNOSIS — E1140 Type 2 diabetes mellitus with diabetic neuropathy, unspecified: Secondary | ICD-10-CM

## 2019-05-05 DIAGNOSIS — G629 Polyneuropathy, unspecified: Secondary | ICD-10-CM

## 2019-05-05 DIAGNOSIS — E785 Hyperlipidemia, unspecified: Secondary | ICD-10-CM

## 2019-05-05 DIAGNOSIS — J3089 Other allergic rhinitis: Secondary | ICD-10-CM | POA: Diagnosis not present

## 2019-05-05 MED ORDER — DULOXETINE HCL 60 MG PO CPEP: 60.0000 mg | ORAL_CAPSULE | Freq: Every day | ORAL | 1 refills | Status: DC

## 2019-05-05 MED ORDER — TRULICITY 1.5 MG/0.5ML ~~LOC~~ SOAJ: 1.5000 mg | SUBCUTANEOUS | 2 refills | Status: DC

## 2019-05-05 MED ORDER — ROSUVASTATIN CALCIUM 5 MG PO TABS: 5.0000 mg | ORAL_TABLET | Freq: Every day | ORAL | 1 refills | Status: DC

## 2019-05-05 MED ORDER — MONTELUKAST SODIUM 10 MG PO TABS: 10.0000 mg | ORAL_TABLET | Freq: Every day | ORAL | 1 refills | Status: DC

## 2019-05-05 MED ORDER — CONTRAVE 8-90 MG PO TB12: 2.0000 | ORAL_TABLET | Freq: Two times a day (BID) | ORAL | 2 refills | Status: DC

## 2019-05-05 NOTE — Progress Notes (Signed)
Name: Jasmine Woods   MRN: [id number]    DOB: 02-06-1956   Date:05/05/2019       Progress Note  Subjective  Chief Complaint  Chief Complaint  Patient presents with  . Medication Refill    3 month F/U  . Diabetes  . Asthma  . Myasthenia Gravis  . Bariatric Surgery/Obesity  . Sleep Apnea    I connected with  [name]  on 05/05/19 at  2:40 PM EST by a video enabled telemedicine application and verified that I am speaking with the correct person using two identifiers.  I discussed the limitations of evaluation and management by telemedicine and the availability of in person appointments. The patient expressed understanding and agreed to proceed. Staff also discussed with the patient that there may be a patient responsible charge related to this service. Patient Location: at work  Provider Location: [facility]    HPI  DMII:She was off medication afterbariatric surgery in 2014,but we re-started medication 0000000 - Trulicity,she denies polyphagia, polydipsia or polyuria. She has been more active, walking at least5000steps daily. hgbA1C from 5.6% to 5.9% down to 5.7%and up to 5.8%,5.6%,5.5% back in June 2020  glucose at home and last 90 day average was 99. Neuropathy is still 2/10, constant, burning-like on both feet, she is on Cymbalta. She could not tolerate gabapentin or Lyrica and also did not work.  Hyperlipidemia: takingCrestor, taking Co-Q 10 andhas noticed improvement ofmuscle aches, no chest pain or SOB. Last lipid panel was a to goal, LDL was 99 back in 11/2018 , continue medications  Asthma:she was seen by Dr. Felicie Morn and PFT, she was placed on Breo and seems to work well for her, she has SOB with activity that may be multifactorial, she has not used rescue inhaler recently, doing well on maintenance medication.  No cough or wheezing. Symptoms stable   Myasthenia Gravis: diagnosed at [facility] by Dr. Burnett Harry,  09/2015, seronegative, only causing ptosis and double vision, no other symptoms. She had a negative chest CT for thymoma, and has started on medication ( Mestinon ), she states it works quickly but wears out very fast. She takes before driving to and until she gets home, takes 4 daily. Does not usually bother her to have double vision when at home.She switched from [facility] to Dr. Posey Pronto to be able to be seen in network. Last visit with neurologist 04/08/2019 and was given diagnosis of benign essential tremor also and neurologist will continue to monitor for now   Chronic Tension Headaches: seen by Dr. Marney Setting wastaking Topamax 100 mg but stopped because of mental fogginess and is doing well even off medication, no problems at this time    Abnormal CT thyroid: incidental finding of thyroid calcification of left lobe, she saw Endo and had negative biopsy., she had an US done one year ago, accidentally given levothyroxine by endo and caused TSH suppression,she is still under the care of Dr. Loanne Drilling   Bariatric Surgery/Obesity: she had bariatric surgery back in 2014 , her weight was almost 300 lbs, went down to close to 200lbs, butwasgradually gaining weight,she was started on Trulicity 0000000 and [id number] lbs, but is gaining it back, she was 241 lbs July 2020 and we started contrave, she has lost 11 lbs on the medication ( 229 lbs )  OSA: she has a new machine, rx written by Dr. Felicie Morn and she states she has been compliant and is doing well   Carpal Tunnel syndrome: seeing Ortho, had  NCS done and showed moderate median neuropathy bilaterally.   SOB and diaphoresis: seeing cardiologist, last visit 03/2019 she saw PA Dunn, and on A/P he stated MRI would be indicated to rule out cardiac amyloidosis.    Patient Active Problem List   Diagnosis Date Noted  . Multinodular goiter 11/25/2018  . Essential hypertension 11/11/2018  . DOE (dyspnea on exertion) 04/15/2018  . Hyperlipidemia  12/09/2017  . Diaphoresis 11/26/2017  . Special screening for malignant neoplasms, colon   . Epiretinal membrane (ERM) of left eye 09/09/2016  . Glaucoma, left eye 09/05/2016  . Seronegative myasthenia gravis ([place]) 08/08/2015  . Psoriasis 07/06/2015  . Stress incontinence 07/06/2015  . Chronic tension-type headache, intractable 05/29/2015  . Anxiety and depression 04/12/2015  . Asthma, mild intermittent 04/12/2015  . Bladder polyp 04/12/2015  . Carpal tunnel syndrome 04/12/2015  . Binocular vision disorder with diplopia 04/12/2015  . Dyslipidemia 04/12/2015  . H/O: HTN (hypertension) 04/12/2015  . Hemorrhoids, internal 04/12/2015  . Neuropathy 04/12/2015  . Nuclear sclerotic cataract 04/12/2015  . Morbid obesity ([place]) 04/12/2015  . Obstructive apnea 04/12/2015  . Perennial allergic rhinitis 04/12/2015  . 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 Type 2 diabetes, controlled, with neuropathy ([place]) 04/12/2015  . Vitamin D deficiency 04/12/2015  . Cephalalgia 08/16/2014  . Lump or mass in breast 06/14/2013  . Bariatric surgery status 10/12/2012    Past Surgical History:  Procedure Laterality Date  . ABDOMINAL HYSTERECTOMY  1991  . [place] SURGERY  2014  . CATARACT EXTRACTION W/PHACO Left 01/28/2017   Procedure: CATARACT EXTRACTION PHACO AND INTRAOCULAR LENS PLACEMENT (IOC);  Surgeon: Birder Robson, MD;  Location: [facility];  Service: Ophthalmology;  Laterality: Left;  [place] 00:37 AP% 17.8 CDE 6.61 Fluid pack lot # [id number]  . COLONOSCOPY  2012  . COLONOSCOPY WITH PROPOFOL N/A 09/30/2016   Procedure: COLONOSCOPY WITH PROPOFOL;  Surgeon: Lucilla Lame, MD;  Location: [place];  Service: Endoscopy;  Laterality: N/A;  diabetic - diet controlled sleep apnea  . KNEE SURGERY Right 1979-1980  . OOPHORECTOMY    . PARS PLANA VITRECTOMY Left 03/27/2016   Procedure: PARS PLANA VITRECTOMY WITH 25 GAUGE AND MEMBRANE PEEL, INTERNAL LIMITING MEMBRANE;  Surgeon:  Hurman Horn, MD;  Location: [place];  Service: Ophthalmology;  Laterality: Left;  . THYROID SURGERY     BX done was benign  . UPPER GI ENDOSCOPY    . VITRECTOMY Left 04/27/2016    Family History  Problem Relation Age of Onset  . Diabetes Mother   . Hypertension Mother   . Heart disease Mother 58       stent x 1   . Migraines Sister   . Stroke Maternal Grandfather   . Diabetes Paternal Grandfather   . Muscular dystrophy Sister   . Heart attack Maternal Aunt   . Breast cancer Neg Hx   . Thyroid disease Neg Hx     Social History   Socioeconomic History  . Marital status: Married    Spouse name: Gwyndolyn Saxon "Butch"  . Number of children: 2  . Years of education: 45  . Highest education level: Associate degree: occupational, Hotel manager, or vocational program  Occupational History  . Occupation: [place]  Social Needs  . Financial resource strain: Not hard at all  . Food insecurity    Worry: Never true    Inability: Never true  . Transportation needs    Medical: No    Non-medical: No  Tobacco Use  .  Smoking status: Former Smoker    Packs/day: 0.50    Years: 3.00    Pack years: 1.50    Start date: 06/24/1972    Quit date: 06/25/1975    Years since quitting: 43.8  . Smokeless tobacco: Never Used  . Tobacco comment: smoked as teenager  Substance and Sexual Activity  . Alcohol use: No    Alcohol/week: 0.0 standard drinks  . Drug use: No  . Sexual activity: Not Currently    Partners: Male  Lifestyle  . Physical activity    Days per week: 7 days    Minutes per session: 20 min  . Stress: Not at all  Relationships  . Social connections    Talks on phone: More than three times a week    Gets together: More than three times a week    Attends religious service: More than 4 times per year    Active member of club or organization: Yes    Attends meetings of clubs or organizations: 1 to 4 times per year    Relationship status: Married  . Intimate partner violence    Fear  of current or ex partner: No    Emotionally abused: No    Physically abused: No    Forced sexual activity: No  Other Topics Concern  . Not on file  Social History Narrative   Right handed   One story home   12 years education   Clerical workers [employer]    Lives with husband     Current Outpatient Medications:  .  ACCU-CHEK GUIDE test strip, USE AS DIRECTED, Disp: 100 each, Rfl: 12 .  acetaminophen (TYLENOL) 500 MG tablet, Take 1,000 mg by mouth every 6 (six) hours as needed for mild pain. Reported on 07/06/2015, Disp: , Rfl:  .  brimonidine-timolol (COMBIGAN) 0.2-0.5 % ophthalmic solution, , Disp: , Rfl:  .  budesonide (RHINOCORT AQUA) 32 MCG/ACT nasal spray, Place 2 sprays into both nostrils at bedtime., Disp: 15 mL, Rfl: 2 .  Coenzyme Q10 (CO Q 10) 100 MG CAPS, Take 1 capsule by mouth daily., Disp: 30 capsule, Rfl: 2 .  Dulaglutide (TRULICITY) 1.5 0000000 SOPN, Inject 1.5 mg into the skin once a week., Disp: 4 pen, Rfl: 2 .  DULoxetine (CYMBALTA) 60 MG capsule, Take 1 capsule (60 mg total) by mouth daily., Disp: 90 capsule, Rfl: 1 .  fluticasone furoate-vilanterol (BREO ELLIPTA) 200-25 MCG/INH AEPB, Inhale 1 puff into the lungs daily. Rinse mouth after use., Disp: 1 each, Rfl: 5 .  latanoprost (XALATAN) 0.005 % ophthalmic solution, , Disp: , Rfl:  .  loratadine (CLARITIN) 10 MG tablet, Take 1 tablet (10 mg total) by mouth 2 (two) times daily. (Patient taking differently: Take 10 mg by mouth daily. ), Disp: 60 tablet, Rfl: 0 .  montelukast (SINGULAIR) 10 MG tablet, Take 1 tablet (10 mg total) by mouth at bedtime., Disp: 90 tablet, Rfl: 1 .  Multiple Vitamins-Minerals (MULTIVITAMIN WITH MINERALS) tablet, Take 1 tablet by mouth daily., Disp: , Rfl:  .  Naltrexone-buPROPion HCl ER (CONTRAVE) 8-90 MG TB12, Take 2 tablets by mouth 2 (two) times a day., Disp: 120 tablet, Rfl: 2 .  Omega-3 Fatty Acids (FISH OIL) 1200 MG CAPS, Take 2 capsules by mouth daily., Disp: , Rfl:  .  pyridostigmine  (MESTINON) 60 MG tablet, Take 1 tablet at 6:30a, 11a, and 3:30p.  OK to take extra dose as needed., Disp: 360 tablet, Rfl: 3 .  rosuvastatin (CRESTOR) 5 MG tablet, Take 1  tablet (5 mg total) by mouth daily., Disp: 90 tablet, Rfl: 1 .  telmisartan-hydrochlorothiazide (MICARDIS HCT) 80-25 MG tablet, Take 1 tablet by mouth daily., Disp: 90 tablet, Rfl: 3 .  VENTOLIN HFA 108 (90 Base) MCG/ACT inhaler, INHALE 2 PUFFS BY MOUTH EVERY 4 HOURS AS NEEDED SHORTNESS OF BREATH/WHEEZING, Disp: 18 g, Rfl: 0  Allergies  Allergen Reactions  . Aspirin Swelling    Upset stomach  Upset stomach   . Azithromycin     Contraindicated in Myasthenia   . Ace Inhibitors     cough  . Amlodipine     Edema of feet and hands  . Gabapentin     anxiety  . Pregabalin Other (See Comments)    anxiety    I personally reviewed active problem list, medication list, allergies, family history, social history, health maintenance with the patient/caregiver today.   ROS  Ten systems reviewed and is negative except as mentioned in HPI   Objective  Virtual encounter, vitals obtained at  Home/work  Today's Vitals   05/05/19 1515  BP: 126/71  Pulse: 68  Temp: 97.8 F (36.6 C)  Weight: 229 lb 8 oz (104.1 kg)   Body mass index is 41.98 kg/m.   There is no height or weight on file to calculate BMI.  Physical Exam  Awake, alert and oriented   PHQ2/9: Depression screen [facility] 2/9 01/18/2019 11/27/2018 09/22/2018 07/31/2018 02/20/2018  Decreased Interest 0 1 0 0 0  Down, Depressed, Hopeless 0 0 0 0 0  PHQ - 2 Score 0 1 0 0 0  Altered sleeping 0 0 1 - 0  Tired, decreased energy 1 1 0 - 0  Change in appetite 1 1 0 - 0  Feeling bad or failure about yourself  0 0 0 - 0  Trouble concentrating 0 0 1 - 0  Moving slowly or fidgety/restless 0 0 0 - 0  Suicidal thoughts 0 0 0 - 0  PHQ-9 Score 2 3 2  - 0  Difficult doing work/chores - - Not difficult at all - -   PHQ-2/9  Negative   Fall Risk: Fall Risk  04/08/2019 02/24/2019  01/18/2019 09/22/2018 07/31/2018  Falls in the past year? 1 1 0 0 0  Number falls in past yr: 1 1 0 0 0  Injury with Fall? 0 0 0 0 0     Assessment & Plan  1. Dyslipidemia  - rosuvastatin (CRESTOR) 5 MG tablet; Take 1 tablet (5 mg total) by mouth daily.  Dispense: 90 tablet; Refill: 1  2. Morbid obesity, unspecified obesity type ([place])  - Naltrexone-buPROPion HCl ER (CONTRAVE) 8-90 MG TB12; Take 2 tablets by mouth 2 (two) times daily.  Dispense: 120 tablet; Refill: 2  3. Type 2 diabetes, controlled, with neuropathy (HCC)  - DULoxetine (CYMBALTA) 60 MG capsule; Take 1 capsule (60 mg total) by mouth daily.  Dispense: 90 capsule; Refill: 1 - Dulaglutide (TRULICITY) 1.5 0000000 SOPN; Inject 1.5 mg into the skin once a week.  Dispense: 4 pen; Refill: 2  4. Peripheral polyneuropathy  - DULoxetine (CYMBALTA) 60 MG capsule; Take 1 capsule (60 mg total) by mouth daily.  Dispense: 90 capsule; Refill: 1  5. Perennial allergic rhinitis  - montelukast (SINGULAIR) 10 MG tablet; Take 1 tablet (10 mg total) by mouth at bedtime.  Dispense: 90 tablet; Refill: 1  I discussed the assessment and treatment plan with the patient. The patient was provided an opportunity to ask questions and all were answered. The patient agreed  with the plan and demonstrated an understanding of the instructions.  The patient was advised to call back or seek an in-person evaluation if the symptoms worsen or if the condition fails to improve as anticipated.  I provided 25 minutes of non-face-to-face time during this encounter.

## 2019-05-10 ENCOUNTER — Telehealth (HOSPITAL_COMMUNITY): Payer: Self-pay | Admitting: Emergency Medicine

## 2019-05-10 NOTE — Telephone Encounter (Signed)
Reaching out to patient to offer assistance regarding upcoming cardiac imaging study; pt verbalizes understanding of appt date/time, parking situation and where to check in, pre-test NPO status and medications ordered, and verified current allergies; name and call back number provided for further questions should they arise Jennelle Pinkstaff RN Navigator Cardiac Imaging [facility] [phone] office [phone] cell 

## 2019-05-12 ENCOUNTER — Ambulatory Visit (HOSPITAL_COMMUNITY)
Admission: RE | Admit: 2019-05-12 | Discharge: 2019-05-12 | Disposition: A | Discharge status: Home / Self Care | Payer: 59 | Source: Ambulatory Visit | Attending: Physician Assistant | Admitting: Physician Assistant

## 2019-05-12 ENCOUNTER — Other Ambulatory Visit: Payer: Self-pay

## 2019-05-12 DIAGNOSIS — R0602 Shortness of breath: Secondary | ICD-10-CM | POA: Diagnosis not present

## 2019-05-12 DIAGNOSIS — E854 Organ-limited amyloidosis: Secondary | ICD-10-CM | POA: Diagnosis not present

## 2019-05-12 MED ORDER — GADOBUTROL 1 MMOL/ML IV SOLN
12.0000 mL | Freq: Once | INTRAVENOUS | Status: AC | PRN
  Administered 2019-05-12: 12 mL via INTRAVENOUS

## 2019-05-13 ENCOUNTER — Telehealth: Payer: Self-pay

## 2019-05-13 NOTE — Telephone Encounter (Signed)
Rise Mu, PA-C  Alroy Dust.Maloni Musleh, RN        Please inform the patient her cardiac MRI was normal.    Attempted to call patient. LMTCB 05/13/2019

## 2019-05-13 NOTE — Telephone Encounter (Signed)
Follow up   Please return call for test results per the previous message.

## 2019-05-14 NOTE — Telephone Encounter (Signed)
Call returned to patient to discuss MRI finding. She had no further questions or concerns at this time.   Advised pt to call for any further questions or concerns. F/u appt confirmed.

## 2019-05-16 ENCOUNTER — Telehealth: Payer: Self-pay | Admitting: Physician Assistant

## 2019-05-16 NOTE — Telephone Encounter (Signed)
Please let her orthopedist, Dr. Ninfa Linden, know she is acceptable risk for non-cardiac surgery. Per Revised Cardiac Index, she is low risk with an estimated 0.4% rate of adverse event. No further cardiac testing is needed at this time.

## 2019-05-17 DIAGNOSIS — G4733 Obstructive sleep apnea (adult) (pediatric): Secondary | ICD-10-CM | POA: Diagnosis not present

## 2019-05-31 ENCOUNTER — Other Ambulatory Visit: Payer: Self-pay | Admitting: Physician Assistant

## 2019-05-31 DIAGNOSIS — G4733 Obstructive sleep apnea (adult) (pediatric): Secondary | ICD-10-CM | POA: Diagnosis not present

## 2019-06-01 ENCOUNTER — Encounter (HOSPITAL_BASED_OUTPATIENT_CLINIC_OR_DEPARTMENT_OTHER): Payer: Self-pay

## 2019-06-01 ENCOUNTER — Other Ambulatory Visit: Payer: Self-pay

## 2019-06-04 ENCOUNTER — Other Ambulatory Visit (HOSPITAL_COMMUNITY)
Admission: RE | Admit: 2019-06-04 | Discharge: 2019-06-04 | Disposition: A | Discharge status: Home / Self Care | Payer: 59 | Source: Ambulatory Visit | Attending: Orthopaedic Surgery | Admitting: Orthopaedic Surgery

## 2019-06-04 ENCOUNTER — Other Ambulatory Visit: Payer: Self-pay

## 2019-06-04 ENCOUNTER — Encounter (HOSPITAL_BASED_OUTPATIENT_CLINIC_OR_DEPARTMENT_OTHER)
Admission: RE | Admit: 2019-06-04 | Discharge: 2019-06-04 | Disposition: A | Discharge status: Home / Self Care | Payer: 59 | Source: Ambulatory Visit | Attending: Orthopaedic Surgery | Admitting: Orthopaedic Surgery

## 2019-06-04 DIAGNOSIS — Z6841 Body Mass Index (BMI) 40.0 and over, adult: Secondary | ICD-10-CM | POA: Diagnosis not present

## 2019-06-04 DIAGNOSIS — M1990 Unspecified osteoarthritis, unspecified site: Secondary | ICD-10-CM | POA: Diagnosis not present

## 2019-06-04 DIAGNOSIS — J45909 Unspecified asthma, uncomplicated: Secondary | ICD-10-CM | POA: Diagnosis not present

## 2019-06-04 DIAGNOSIS — Z888 Allergy status to other drugs, medicaments and biological substances status: Secondary | ICD-10-CM | POA: Diagnosis not present

## 2019-06-04 DIAGNOSIS — Z9842 Cataract extraction status, left eye: Secondary | ICD-10-CM | POA: Diagnosis not present

## 2019-06-04 DIAGNOSIS — Z881 Allergy status to other antibiotic agents status: Secondary | ICD-10-CM | POA: Diagnosis not present

## 2019-06-04 DIAGNOSIS — G7000 Myasthenia gravis without (acute) exacerbation: Secondary | ICD-10-CM | POA: Diagnosis not present

## 2019-06-04 DIAGNOSIS — G5602 Carpal tunnel syndrome, left upper limb: Secondary | ICD-10-CM | POA: Diagnosis not present

## 2019-06-04 DIAGNOSIS — Z01812 Encounter for preprocedural laboratory examination: Secondary | ICD-10-CM | POA: Diagnosis not present

## 2019-06-04 DIAGNOSIS — Z9884 Bariatric surgery status: Secondary | ICD-10-CM | POA: Diagnosis not present

## 2019-06-04 DIAGNOSIS — Z20828 Contact with and (suspected) exposure to other viral communicable diseases: Secondary | ICD-10-CM | POA: Insufficient documentation

## 2019-06-04 DIAGNOSIS — G4733 Obstructive sleep apnea (adult) (pediatric): Secondary | ICD-10-CM | POA: Diagnosis not present

## 2019-06-04 DIAGNOSIS — H409 Unspecified glaucoma: Secondary | ICD-10-CM | POA: Diagnosis not present

## 2019-06-04 DIAGNOSIS — F329 Major depressive disorder, single episode, unspecified: Secondary | ICD-10-CM | POA: Diagnosis not present

## 2019-06-04 DIAGNOSIS — E559 Vitamin D deficiency, unspecified: Secondary | ICD-10-CM | POA: Diagnosis not present

## 2019-06-04 DIAGNOSIS — Z79899 Other long term (current) drug therapy: Secondary | ICD-10-CM | POA: Diagnosis not present

## 2019-06-04 DIAGNOSIS — F419 Anxiety disorder, unspecified: Secondary | ICD-10-CM | POA: Diagnosis not present

## 2019-06-04 DIAGNOSIS — Z886 Allergy status to analgesic agent status: Secondary | ICD-10-CM | POA: Diagnosis not present

## 2019-06-04 DIAGNOSIS — Z90710 Acquired absence of both cervix and uterus: Secondary | ICD-10-CM | POA: Diagnosis not present

## 2019-06-04 DIAGNOSIS — Z87891 Personal history of nicotine dependence: Secondary | ICD-10-CM | POA: Diagnosis not present

## 2019-06-04 DIAGNOSIS — R011 Cardiac murmur, unspecified: Secondary | ICD-10-CM | POA: Diagnosis not present

## 2019-06-04 DIAGNOSIS — H532 Diplopia: Secondary | ICD-10-CM | POA: Diagnosis not present

## 2019-06-04 DIAGNOSIS — E669 Obesity, unspecified: Secondary | ICD-10-CM | POA: Diagnosis not present

## 2019-06-04 DIAGNOSIS — E1140 Type 2 diabetes mellitus with diabetic neuropathy, unspecified: Secondary | ICD-10-CM | POA: Diagnosis not present

## 2019-06-04 LAB — BASIC METABOLIC PANEL
Anion gap: 9 (ref 5–15)
BUN: 16 mg/dL (ref 8–23)
CO2: 26 mmol/L (ref 22–32)
Calcium: 9.3 mg/dL (ref 8.9–10.3)
Chloride: 103 mmol/L (ref 98–111)
Creatinine, Ser: 0.64 mg/dL (ref 0.44–1.00)
GFR calc Af Amer: >60 mL/min (ref >60)
GFR calc non Af Amer: >60 mL/min (ref >60)
Glucose, Bld: 84 mg/dL (ref 70–99)
Potassium: 3.9 mmol/L (ref 3.5–5.1)
Sodium: 138 mmol/L (ref 135–145)

## 2019-06-04 NOTE — Progress Notes (Signed)

## 2019-06-05 LAB — NOVEL CORONAVIRUS, NAA (HOSP ORDER, SEND-OUT TO REF LAB; TAT 18-24 HRS): SARS-CoV-2, NAA: NOT DETECTED

## 2019-06-07 NOTE — Anesthesia Preprocedure Evaluation (Addendum)
Anesthesia Evaluation  Patient identified by MRN, date of birth, ID band Patient awake    Reviewed: Allergy & Precautions, H&P , NPO status , Patient's Chart, lab work & pertinent test results  History of Anesthesia Complications (+) history of anesthetic complications  Airway Mallampati: I  TM Distance: >3 FB Neck ROM: full    Dental no notable dental hx. (+) Poor Dentition, Chipped   Pulmonary neg shortness of breath, asthma , sleep apnea and Continuous Positive Airway Pressure Ventilation , former smoker,    Pulmonary exam normal breath sounds clear to auscultation       Cardiovascular Exercise Tolerance: Good hypertension, (-) angina(-) Past MI and (-) DOE Normal cardiovascular exam+ Valvular Problems/Murmurs  Rhythm:Regular Rate:Normal     Neuro/Psych  Headaches, PSYCHIATRIC DISORDERS Anxiety Depression  Neuromuscular disease    GI/Hepatic negative GI ROS, Neg liver ROS,   Endo/Other  diabetes, Type 2  Renal/GU      Musculoskeletal  (+) Arthritis ,   Abdominal   Peds  Hematology negative hematology ROS (+)   Anesthesia Other Findings Past Medical History: Diabetes mellitus without complication     Comment:  Diet controlled  H/O myasthenia gravis     Comment:  MOSTLY AFFECTS EYES  HA (headache) Hx of bariatric surgery Hypertension  Obstructive sleep apnea on CPAP     Comment:  uses cpap No date: Patient is Jehovah's Witness     Reproductive/Obstetrics negative OB ROS                            Anesthesia Physical  Anesthesia Plan  ASA: III  Anesthesia Plan: MAC   Post-op Pain Management:    Induction: Intravenous  PONV Risk Score and Plan: 2 and Ondansetron and Treatment may vary due to age or medical condition  Airway Management Planned: Natural Airway and Nasal Cannula  Additional Equipment:   Intra-op Plan:   Post-operative Plan:   Informed Consent: I have  reviewed the patients History and Physical, chart, labs and discussed the procedure including the risks, benefits and alternatives for the proposed anesthesia with the patient or authorized representative who has indicated his/her understanding and acceptance.     Dental Advisory Given  Plan Discussed with: Anesthesiologist, CRNA and Surgeon  Anesthesia Plan Comments:         Anesthesia Quick Evaluation

## 2019-06-08 ENCOUNTER — Ambulatory Visit (HOSPITAL_BASED_OUTPATIENT_CLINIC_OR_DEPARTMENT_OTHER): Payer: 59 | Admitting: Anesthesiology

## 2019-06-08 ENCOUNTER — Encounter (HOSPITAL_BASED_OUTPATIENT_CLINIC_OR_DEPARTMENT_OTHER): Payer: Self-pay | Admitting: Orthopaedic Surgery

## 2019-06-08 ENCOUNTER — Other Ambulatory Visit: Payer: Self-pay

## 2019-06-08 ENCOUNTER — Encounter (HOSPITAL_BASED_OUTPATIENT_CLINIC_OR_DEPARTMENT_OTHER)
Admission: RE | Disposition: A | Discharge status: Home / Self Care | Payer: Self-pay | Source: Home / Self Care | Attending: Orthopaedic Surgery

## 2019-06-08 ENCOUNTER — Ambulatory Visit (HOSPITAL_BASED_OUTPATIENT_CLINIC_OR_DEPARTMENT_OTHER)
Admission: RE | Admit: 2019-06-08 | Discharge: 2019-06-08 | Disposition: A | Discharge status: Home / Self Care | Payer: 59 | Attending: Orthopaedic Surgery | Admitting: Orthopaedic Surgery

## 2019-06-08 DIAGNOSIS — J4521 Mild intermittent asthma with (acute) exacerbation: Secondary | ICD-10-CM | POA: Diagnosis not present

## 2019-06-08 DIAGNOSIS — Z90710 Acquired absence of both cervix and uterus: Secondary | ICD-10-CM | POA: Insufficient documentation

## 2019-06-08 DIAGNOSIS — F419 Anxiety disorder, unspecified: Secondary | ICD-10-CM | POA: Insufficient documentation

## 2019-06-08 DIAGNOSIS — G4733 Obstructive sleep apnea (adult) (pediatric): Secondary | ICD-10-CM | POA: Insufficient documentation

## 2019-06-08 DIAGNOSIS — E559 Vitamin D deficiency, unspecified: Secondary | ICD-10-CM | POA: Insufficient documentation

## 2019-06-08 DIAGNOSIS — G7000 Myasthenia gravis without (acute) exacerbation: Secondary | ICD-10-CM | POA: Diagnosis not present

## 2019-06-08 DIAGNOSIS — H409 Unspecified glaucoma: Secondary | ICD-10-CM | POA: Diagnosis not present

## 2019-06-08 DIAGNOSIS — Z87891 Personal history of nicotine dependence: Secondary | ICD-10-CM | POA: Insufficient documentation

## 2019-06-08 DIAGNOSIS — H532 Diplopia: Secondary | ICD-10-CM | POA: Insufficient documentation

## 2019-06-08 DIAGNOSIS — E1140 Type 2 diabetes mellitus with diabetic neuropathy, unspecified: Secondary | ICD-10-CM | POA: Diagnosis not present

## 2019-06-08 DIAGNOSIS — Z886 Allergy status to analgesic agent status: Secondary | ICD-10-CM | POA: Insufficient documentation

## 2019-06-08 DIAGNOSIS — G5602 Carpal tunnel syndrome, left upper limb: Secondary | ICD-10-CM

## 2019-06-08 DIAGNOSIS — F329 Major depressive disorder, single episode, unspecified: Secondary | ICD-10-CM | POA: Diagnosis not present

## 2019-06-08 DIAGNOSIS — Z881 Allergy status to other antibiotic agents status: Secondary | ICD-10-CM | POA: Insufficient documentation

## 2019-06-08 DIAGNOSIS — E669 Obesity, unspecified: Secondary | ICD-10-CM | POA: Insufficient documentation

## 2019-06-08 DIAGNOSIS — Z888 Allergy status to other drugs, medicaments and biological substances status: Secondary | ICD-10-CM | POA: Insufficient documentation

## 2019-06-08 DIAGNOSIS — F418 Other specified anxiety disorders: Secondary | ICD-10-CM | POA: Diagnosis not present

## 2019-06-08 DIAGNOSIS — Z9884 Bariatric surgery status: Secondary | ICD-10-CM | POA: Insufficient documentation

## 2019-06-08 DIAGNOSIS — J45909 Unspecified asthma, uncomplicated: Secondary | ICD-10-CM | POA: Diagnosis not present

## 2019-06-08 DIAGNOSIS — Z79899 Other long term (current) drug therapy: Secondary | ICD-10-CM | POA: Insufficient documentation

## 2019-06-08 DIAGNOSIS — Z9842 Cataract extraction status, left eye: Secondary | ICD-10-CM | POA: Insufficient documentation

## 2019-06-08 DIAGNOSIS — Z6841 Body Mass Index (BMI) 40.0 and over, adult: Secondary | ICD-10-CM | POA: Insufficient documentation

## 2019-06-08 DIAGNOSIS — E785 Hyperlipidemia, unspecified: Secondary | ICD-10-CM | POA: Diagnosis not present

## 2019-06-08 DIAGNOSIS — R011 Cardiac murmur, unspecified: Secondary | ICD-10-CM | POA: Insufficient documentation

## 2019-06-08 DIAGNOSIS — M1990 Unspecified osteoarthritis, unspecified site: Secondary | ICD-10-CM | POA: Insufficient documentation

## 2019-06-08 HISTORY — PX: CARPAL TUNNEL RELEASE: SHX101

## 2019-06-08 HISTORY — DX: Other complications of anesthesia, initial encounter: T88.59XA

## 2019-06-08 LAB — GLUCOSE, CAPILLARY
Glucose-Capillary: 79 mg/dL (ref 70–99)
Glucose-Capillary: 88 mg/dL (ref 70–99)

## 2019-06-08 SURGERY — CARPAL TUNNEL RELEASE
Anesthesia: Monitor Anesthesia Care | Site: Hand | Laterality: Left

## 2019-06-08 MED ORDER — BUPIVACAINE HCL (PF) 0.25 % IJ SOLN
INTRAMUSCULAR | Status: DC | PRN
  Administered 2019-06-08: 4 mL

## 2019-06-08 MED ORDER — PROPOFOL 10 MG/ML IV BOLUS
INTRAVENOUS | Status: DC | PRN
  Administered 2019-06-08: 30 mg via INTRAVENOUS

## 2019-06-08 MED ORDER — FENTANYL CITRATE (PF) 100 MCG/2ML IJ SOLN
INTRAMUSCULAR | Status: AC
  Filled 2019-06-08: qty 2

## 2019-06-08 MED ORDER — LIDOCAINE 2% (20 MG/ML) 5 ML SYRINGE
INTRAMUSCULAR | Status: DC | PRN
  Administered 2019-06-08: 40 mg via INTRAVENOUS

## 2019-06-08 MED ORDER — ONDANSETRON HCL 4 MG/2ML IJ SOLN
INTRAMUSCULAR | Status: DC | PRN
  Administered 2019-06-08: 4 mg via INTRAVENOUS

## 2019-06-08 MED ORDER — PROPOFOL 500 MG/50ML IV EMUL
INTRAVENOUS | Status: DC | PRN
  Administered 2019-06-08: 75 ug/kg/min via INTRAVENOUS

## 2019-06-08 MED ORDER — OXYCODONE HCL 5 MG PO TABS: 5.0000 mg | ORAL_TABLET | Freq: Once | ORAL | Status: DC | PRN

## 2019-06-08 MED ORDER — FENTANYL CITRATE (PF) 100 MCG/2ML IJ SOLN: 50.0000 ug | INTRAMUSCULAR | Status: DC | PRN

## 2019-06-08 MED ORDER — MIDAZOLAM HCL 2 MG/2ML IJ SOLN
INTRAMUSCULAR | Status: AC
  Filled 2019-06-08: qty 2

## 2019-06-08 MED ORDER — DEXMEDETOMIDINE HCL IN NACL 200 MCG/50ML IV SOLN
INTRAVENOUS | Status: DC | PRN
  Administered 2019-06-08 (×2): 4 ug via INTRAVENOUS

## 2019-06-08 MED ORDER — LACTATED RINGERS IV SOLN: INTRAVENOUS | Status: DC

## 2019-06-08 MED ORDER — LIDOCAINE HCL (PF) 1 % IJ SOLN
INTRAMUSCULAR | Status: DC | PRN
  Administered 2019-06-08: 4 mL

## 2019-06-08 MED ORDER — HYDROCODONE-ACETAMINOPHEN 5-325 MG PO TABS: 1.0000 | ORAL_TABLET | Freq: Four times a day (QID) | ORAL | 0 refills | Status: DC | PRN

## 2019-06-08 MED ORDER — LIDOCAINE 2% (20 MG/ML) 5 ML SYRINGE
INTRAMUSCULAR | Status: AC
  Filled 2019-06-08: qty 5

## 2019-06-08 MED ORDER — LIDOCAINE HCL (PF) 1 % IJ SOLN
INTRAMUSCULAR | Status: AC
  Filled 2019-06-08: qty 30

## 2019-06-08 MED ORDER — MIDAZOLAM HCL 5 MG/5ML IJ SOLN
INTRAMUSCULAR | Status: DC | PRN
  Administered 2019-06-08: 2 mg via INTRAVENOUS

## 2019-06-08 MED ORDER — ONDANSETRON HCL 4 MG/2ML IJ SOLN
INTRAMUSCULAR | Status: AC
  Filled 2019-06-08: qty 2

## 2019-06-08 MED ORDER — ACETAMINOPHEN 160 MG/5ML PO SOLN: 325.0000 mg | ORAL | Status: DC | PRN

## 2019-06-08 MED ORDER — CHLORHEXIDINE GLUCONATE 4 % EX LIQD: 60.0000 mL | Freq: Once | CUTANEOUS | Status: DC

## 2019-06-08 MED ORDER — MIDAZOLAM HCL 2 MG/2ML IJ SOLN: 1.0000 mg | INTRAMUSCULAR | Status: DC | PRN

## 2019-06-08 MED ORDER — FENTANYL CITRATE (PF) 100 MCG/2ML IJ SOLN: 25.0000 ug | INTRAMUSCULAR | Status: DC | PRN

## 2019-06-08 MED ORDER — ONDANSETRON HCL 4 MG/2ML IJ SOLN: 4.0000 mg | Freq: Once | INTRAMUSCULAR | Status: DC | PRN

## 2019-06-08 MED ORDER — MEPERIDINE HCL 25 MG/ML IJ SOLN: 6.2500 mg | INTRAMUSCULAR | Status: DC | PRN

## 2019-06-08 MED ORDER — FENTANYL CITRATE (PF) 100 MCG/2ML IJ SOLN
INTRAMUSCULAR | Status: DC | PRN
  Administered 2019-06-08 (×2): 50 ug via INTRAVENOUS

## 2019-06-08 MED ORDER — OXYCODONE HCL 5 MG/5ML PO SOLN: 5.0000 mg | Freq: Once | ORAL | Status: DC | PRN

## 2019-06-08 MED ORDER — CEFAZOLIN SODIUM-DEXTROSE 2-4 GM/100ML-% IV SOLN
2.0000 g | INTRAVENOUS | Status: AC
  Administered 2019-06-08: 2 g via INTRAVENOUS

## 2019-06-08 MED ORDER — ACETAMINOPHEN 325 MG PO TABS: 325.0000 mg | ORAL_TABLET | ORAL | Status: DC | PRN

## 2019-06-08 SURGICAL SUPPLY — 38 items
BLADE SURG 15 STRL LF DISP TIS (BLADE) ×2 IMPLANT
BLADE SURG 15 STRL SS (BLADE) ×4
BNDG ELASTIC 3X5.8 VLCR STR LF (GAUZE/BANDAGES/DRESSINGS) ×3 IMPLANT
BNDG ESMARK 4X9 LF (GAUZE/BANDAGES/DRESSINGS) IMPLANT
CORD BIPOLAR FORCEPS 12FT (ELECTRODE) ×3 IMPLANT
COVER BACK TABLE REUSABLE LG (DRAPES) ×3 IMPLANT
COVER MAYO STAND REUSABLE (DRAPES) ×3 IMPLANT
COVER WAND RF STERILE (DRAPES) IMPLANT
CUFF TOURN SGL QUICK 18X4 (TOURNIQUET CUFF) IMPLANT
CUFF TOURN SGL QUICK 24 (TOURNIQUET CUFF)
CUFF TRNQT CYL 24X4X16.5-23 (TOURNIQUET CUFF) IMPLANT
DRAPE EXTREMITY T 121X128X90 (DISPOSABLE) ×3 IMPLANT
DRAPE SURG 17X23 STRL (DRAPES) ×3 IMPLANT
DURAPREP 26ML APPLICATOR (WOUND CARE) ×3 IMPLANT
GAUZE SPONGE 4X4 12PLY STRL (GAUZE/BANDAGES/DRESSINGS) ×3 IMPLANT
GAUZE SPONGE 4X4 12PLY STRL LF (GAUZE/BANDAGES/DRESSINGS) ×3 IMPLANT
GAUZE XEROFORM 1X8 LF (GAUZE/BANDAGES/DRESSINGS) ×3 IMPLANT
GLOVE BIO SURGEON STRL SZ7.5 (GLOVE) ×6 IMPLANT
GLOVE BIOGEL PI IND STRL 8 (GLOVE) ×2 IMPLANT
GLOVE BIOGEL PI INDICATOR 8 (GLOVE) ×4
GOWN STRL REUS W/ TWL LRG LVL3 (GOWN DISPOSABLE) ×1 IMPLANT
GOWN STRL REUS W/TWL LRG LVL3 (GOWN DISPOSABLE) ×2
GOWN STRL REUS W/TWL XL LVL3 (GOWN DISPOSABLE) ×6 IMPLANT
LOOP VESSEL MAXI BLUE (MISCELLANEOUS) IMPLANT
NEEDLE HYPO 25X1 1.5 SAFETY (NEEDLE) IMPLANT
NS IRRIG 1000ML POUR BTL (IV SOLUTION) ×3 IMPLANT
PACK BASIN DAY SURGERY FS (CUSTOM PROCEDURE TRAY) ×3 IMPLANT
PAD CAST 3X4 CTTN HI CHSV (CAST SUPPLIES) ×1 IMPLANT
PADDING CAST ABS 4INX4YD NS (CAST SUPPLIES) ×2
PADDING CAST ABS COTTON 4X4 ST (CAST SUPPLIES) ×1 IMPLANT
PADDING CAST COTTON 3X4 STRL (CAST SUPPLIES) ×2
SLEEVE SCD COMPRESS KNEE MED (MISCELLANEOUS) IMPLANT
STOCKINETTE 4X48 STRL (DRAPES) ×3 IMPLANT
SUT ETHILON 3 0 PS 1 (SUTURE) ×3 IMPLANT
SYR BULB 3OZ (MISCELLANEOUS) ×3 IMPLANT
SYR CONTROL 10ML LL (SYRINGE) IMPLANT
TOWEL GREEN STERILE FF (TOWEL DISPOSABLE) ×3 IMPLANT
UNDERPAD 30X36 HEAVY ABSORB (UNDERPADS AND DIAPERS) ×3 IMPLANT

## 2019-06-08 NOTE — H&P (Signed)
Jasmine Woods is an 63 y.o. female.   Chief Complaint:   Left hand numbness/tingling and weakness HPI:   63 yo female with left carpal tunnel syndrome confirmed by EMG/NCV studies.  Presenting for a recommended left open carpal tunnel release given the severity of her CTS and given the failure of conservative treatment.  Past Medical History:  Diagnosis Date  . Allergy   . Anxiety   . Asthma   . Bladder polyp    Dr. Ernst Spell  . Carpal tunnel syndrome   . Complication of anesthesia    needed nebulizer after surgery  . Depression   . Diabetes mellitus without complication ([place])    Diet controlled  . Double vision    seen by Dr. 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 Dyslipidemia   . Frequency of urination   . Glaucoma, left eye   . H/O myasthenia gravis    MOSTLY AFFECTS EYES  . HA (headache)   . Heart murmur    followed by PCP  . Hx of bariatric surgery   . Hypertension    no meds  . Internal hemorrhoids   . Myasthenia gravis ([place])    "mostly effects eyes"  . Myasthenia gravis ([place])   . Neuropathy, generalized    diabetic on feet  . Nuclear sclerotic cataract of both eyes   . Obesity (BMI 30-39.9)   . Obstructive sleep apnea on CPAP    uses cpap  . Patient is Jehovah's Witness    no blood products  . Rotator cuff tendonitis 07/2009   also has impingment right shoulder pain, seen by Dr. Mack Guise  . Sleep apnea   . Thyroid nodule   . Vaginal dryness, menopausal   . Vitamin D deficiency     Past Surgical History:  Procedure Laterality Date  . ABDOMINAL HYSTERECTOMY  1991  . [place] SURGERY  2014  . CATARACT EXTRACTION W/PHACO Left 01/28/2017   Procedure: CATARACT EXTRACTION PHACO AND INTRAOCULAR LENS PLACEMENT (IOC);  Surgeon: Birder Robson, MD;  Location: [facility];  Service: Ophthalmology;  Laterality: Left;  [place] 00:37 AP% 17.8 CDE 6.61 Fluid pack lot # [id number]  . COLONOSCOPY  2012  . COLONOSCOPY WITH PROPOFOL N/A 09/30/2016   Procedure:  COLONOSCOPY WITH PROPOFOL;  Surgeon: Lucilla Lame, MD;  Location: [place];  Service: Endoscopy;  Laterality: N/A;  diabetic - diet controlled sleep apnea  . KNEE SURGERY Right 1979-1980  . OOPHORECTOMY    . PARS PLANA VITRECTOMY Left 03/27/2016   Procedure: PARS PLANA VITRECTOMY WITH 25 GAUGE AND MEMBRANE PEEL, INTERNAL LIMITING MEMBRANE;  Surgeon: Hurman Horn, MD;  Location: [place];  Service: Ophthalmology;  Laterality: Left;  . THYROID SURGERY     BX done was benign  . UPPER GI ENDOSCOPY    . VITRECTOMY Left 04/27/2016    Family History  Problem Relation Age of Onset  . Diabetes Mother   . Hypertension Mother   . Heart disease Mother 84       stent x 1   . Migraines Sister   . Stroke Maternal Grandfather   . Diabetes Paternal Grandfather   . Muscular dystrophy Sister   . Heart attack Maternal Aunt   . Breast cancer Neg Hx   . Thyroid disease Neg Hx    Social History:  reports that she quit smoking about 43 years ago. She started smoking about 46 years ago. She has a 1.50 pack-year smoking history. She has never used smokeless tobacco. She  reports that she does not drink alcohol or use drugs.  Allergies:  Allergies  Allergen Reactions  . Aspirin Swelling    Upset stomach  Upset stomach   . Azithromycin     Contraindicated in Myasthenia   . Ace Inhibitors     cough  . Amlodipine     Edema of feet and hands  . Gabapentin     anxiety  . Pregabalin Other (See Comments)    anxiety    Medications Prior to Admission  Medication Sig Dispense Refill  . acetaminophen (TYLENOL) 500 MG tablet Take 1,000 mg by mouth every 6 (six) hours as needed for mild pain. Reported on 07/06/2015    . brimonidine-timolol (COMBIGAN) 0.2-0.5 % ophthalmic solution     . budesonide (RHINOCORT AQUA) 32 MCG/ACT nasal spray Place 2 sprays into both nostrils at bedtime. 15 mL 2  . Coenzyme Q10 (CO Q 10) 100 MG CAPS Take 1 capsule by mouth daily. 30 capsule 2  . Dulaglutide (TRULICITY) 1.5  0000000 SOPN Inject 1.5 mg into the skin once a week. 4 pen 2  . DULoxetine (CYMBALTA) 60 MG capsule Take 1 capsule (60 mg total) by mouth daily. 90 capsule 1  . fluticasone furoate-vilanterol (BREO ELLIPTA) 200-25 MCG/INH AEPB Inhale 1 puff into the lungs daily. Rinse mouth after use. 1 each 5  . latanoprost (XALATAN) 0.005 % ophthalmic solution     . loratadine (CLARITIN) 10 MG tablet Take 1 tablet (10 mg total) by mouth 2 (two) times daily. (Patient taking differently: Take 10 mg by mouth daily. ) 60 tablet 0  . montelukast (SINGULAIR) 10 MG tablet Take 1 tablet (10 mg total) by mouth at bedtime. 90 tablet 1  . Multiple Vitamins-Minerals (MULTIVITAMIN WITH MINERALS) tablet Take 1 tablet by mouth daily.    . Naltrexone-buPROPion HCl ER (CONTRAVE) 8-90 MG TB12 Take 2 tablets by mouth 2 (two) times daily. 120 tablet 2  . Omega-3 Fatty Acids (FISH OIL) 1200 MG CAPS Take 2 capsules by mouth daily.    Marland Kitchen pyridostigmine (MESTINON) 60 MG tablet Take 1 tablet at 6:30a, 11a, and 3:30p.  OK to take extra dose as needed. 360 tablet 3  . rosuvastatin (CRESTOR) 5 MG tablet Take 1 tablet (5 mg total) by mouth daily. 90 tablet 1  . telmisartan-hydrochlorothiazide (MICARDIS HCT) 80-25 MG tablet Take 1 tablet by mouth daily. 90 tablet 3  . ACCU-CHEK GUIDE test strip USE AS DIRECTED 100 each 12  . VENTOLIN HFA 108 (90 Base) MCG/ACT inhaler INHALE 2 PUFFS BY MOUTH EVERY 4 HOURS AS NEEDED SHORTNESS OF BREATH/WHEEZING 18 g 0    No results found for this or any previous visit (from the past 76 hour(s)). No results found.  Review of Systems  All other systems reviewed and are negative.   Blood pressure 137/61, pulse (!) 56, temperature 97.7 F (36.5 C), temperature source Temporal, resp. rate 18, height 5\' 2"  (1.575 m), weight 104.3 kg, SpO2 100 %. Physical Exam  Constitutional: She is oriented to person, place, and time. She appears well-developed and well-nourished.  HENT:  Head: Normocephalic and  atraumatic.  Eyes: Pupils are equal, round, and reactive to light. EOM are normal.  Cardiovascular: Normal rate and regular rhythm.  Respiratory: Effort normal and breath sounds normal.  GI: Soft. Bowel sounds are normal.  Musculoskeletal:     Left hand: Decreased strength. Decreased sensation of the median distribution.     Cervical back: Normal range of motion and neck supple.  Neurological:  She is alert and oriented to person, place, and time.  Skin: Skin is warm and dry.  Psychiatric: She has a normal mood and affect.     Assessment/Plan Left carpal tunnel syndrome  Given the failure of conservative treatment, we will proceed to the OR today as an outpatient for a left open carpal tunnel release.  The risks and benefits of surgery have been discussed and informed consent is obtained.  Mcarthur Rossetti, MD 06/08/2019, 7:09 AM

## 2019-06-08 NOTE — Brief Op Note (Signed)
06/08/2019  0000000 AM  PATIENT:  Jasmine Woods  63 y.o. female  PRE-OPERATIVE DIAGNOSIS:  left carpal tunnel syndrome  POST-OPERATIVE DIAGNOSIS:  left carpal tunnel syndrome  PROCEDURE:  Procedure(s): LEFT CARPAL TUNNEL RELEASE (Left)  SURGEON:  Surgeon(s) and Role:    Mcarthur Rossetti, MD - Primary  ANESTHESIA:   local and IV sedation  EBL: minimal  COUNTS:  YES  TOURNIQUET:   Total Tourniquet Time Documented: Forearm (Left) - 8 minutes Total: Forearm (Left) - 8 minutes   DICTATION: .Other Dictation: Dictation Number [id number]  PLAN OF CARE: Discharge to home after PACU  PATIENT DISPOSITION:  PACU - hemodynamically stable.   Delay start of Pharmacological VTE agent (>24hrs) due to surgical blood loss or risk of bleeding: no

## 2019-06-08 NOTE — Anesthesia Postprocedure Evaluation (Signed)
Anesthesia Post Note  Patient: Jasmine Woods  Procedure(s) Performed: LEFT CARPAL TUNNEL RELEASE (Left Hand)     Anesthesia Post Evaluation  Last Vitals:  Vitals:   06/08/19 0830 06/08/19 0838  BP: (!) 116/48 (!) 156/78  Pulse: (!) 51 (!) 57  Resp: 14 16  Temp:  36.6 C  SpO2: 96% 100%    Last Pain:  Vitals:   06/08/19 0838  TempSrc:   PainSc: 0-No pain                 Abagale Boulos

## 2019-06-08 NOTE — Transfer of Care (Signed)
Immediate Anesthesia Transfer of Care Note  Patient: Jasmine Woods  Procedure(s) Performed: LEFT CARPAL TUNNEL RELEASE (Left Hand)  Patient Location: PACU  Anesthesia Type:MAC  Level of Consciousness: awake, alert  and oriented  Airway & Oxygen Therapy: Patient Spontanous Breathing and Patient connected to face mask oxygen  Post-op Assessment: Report given to RN and Post -op Vital signs reviewed and stable  Post vital signs: Reviewed and stable  Last Vitals:  Vitals Value Taken Time  BP    Temp    Pulse 56 06/08/19 0754  Resp 14 06/08/19 0754  SpO2 100 % 06/08/19 0754  Vitals shown include unvalidated device data.  Last Pain:  Vitals:   06/08/19 0700  TempSrc:   PainSc: 0-No pain      Patients Stated Pain Goal: 5 (34/74/25 9563)  Complications: No apparent anesthesia complications

## 2019-06-08 NOTE — Discharge Instructions (Signed)
You can use her hand as comfort allows. Do expect swelling -ice and elevation as needed. Keep your incision and dressing clean and dry for the next 6 days. In 6 days you can remove your dressings and get your incision wet in the shower but try not to submerge it under water. After you remove your dressing, you can place a large Band-Aid over your incision daily after you shower.     Post Anesthesia Home Care Instructions  Activity: Get plenty of rest for the remainder of the day. A responsible individual must stay with you for 24 hours following the procedure.  For the next 24 hours, DO NOT: -Drive a car -Paediatric nurse -Drink alcoholic beverages -Take any medication unless instructed by your physician -Make any legal decisions or sign important papers.  Meals: Start with liquid foods such as gelatin or soup. Progress to regular foods as tolerated. Avoid greasy, spicy, heavy foods. If nausea and/or vomiting occur, drink only clear liquids until the nausea and/or vomiting subsides. Call your physician if vomiting continues.  Special Instructions/Symptoms: Your throat may feel dry or sore from the anesthesia or the breathing tube placed in your throat during surgery. If this causes discomfort, gargle with warm salt water. The discomfort should disappear within 24 hours.  If you had a scopolamine patch placed behind your ear for the management of post- operative nausea and/or vomiting:  1. The medication in the patch is effective for 72 hours, after which it should be removed.  Wrap patch in a tissue and discard in the trash. Wash hands thoroughly with soap and water. 2. You may remove the patch earlier than 72 hours if you experience unpleasant side effects which may include dry mouth, dizziness or visual disturbances. 3. Avoid touching the patch. Wash your hands with soap and water after contact with the patch.

## 2019-06-08 NOTE — Op Note (Signed)
NAME: Jasmine Woods, Jasmine Woods MEDICAL RECORD [id number] ACCOUNT [id number] DATE OF BIRTH:[birth year] FACILITY: [facility] LOCATION: [facility] PHYSICIAN:Kylea Berrong [place], MD  OPERATIVE REPORT  DATE OF PROCEDURE:  06/08/2019  PREOPERATIVE DIAGNOSIS:  Moderate to severe carpal tunnel syndrome, left upper extremity.  POSTOPERATIVE DIAGNOSIS:  Moderate to severe carpal tunnel syndrome, left upper extremity.  PROCEDURE:  Left open carpal tunnel release.  SURGEON:  Lind Guest. Ninfa Linden, MD  ANESTHESIA: 1.  Mask ventilation with moderate sedation. 2.  Mixture of 1% plain lidocaine followed by 0.25% plain Marcaine.  TOURNIQUET TIME:  Less than 15 minutes.  ESTIMATED BLOOD LOSS:  Minimal.  COMPLICATIONS:  None.  INDICATIONS:  The patient is a 63 year old female with known carpal tunnel syndrome confirmed by clinical exam and EMG nerve conduction velocity studies.  She has tried and failed conservative treatment.  She still continues to have weakness and numbness  and tingling in her hand.  We have recommended open carpal tunnel release.  We had a long and thorough discussion about the transverse carpal ligament and the anatomy of the wrist.  We talked about surgical and nonsurgical options.  We did describe the  risks and benefits of surgery.  DESCRIPTION OF PROCEDURE:  After informed consent was obtained and appropriate left arm was marked, she was brought to the operating room and placed supine on the operating table, left arm on arm table.  A nonsterile forearm tourniquet was placed and her  left hand and wrist were prepped and draped with DuraPrep and sterile drapes.  A time-out was called and she was identified, correct patient, correct left hand.  Moderate sedation was given and then we anesthetized the palm of the hand first with our  plain lidocaine and then followed by plain Marcaine.  We then made an incision in the palm of the hand over the transverse carpal ligament and  carried this proximally and distally.  We dissected down the distal edge of the transverse carpal ligament and  then slowly and meticulously divided it from distal to proximal protecting the median nerve.  Once we had released it, we explored the median nerve and found it and its motor branch to be intact.  We then irrigated the wound with normal saline solution.   We closed the incision with interrupted nylon suture.  Xeroform well-padded sterile dressing was applied.  The tourniquet was let down, her fingers pinkened nicely.  She was able to move her fingers and thumb.  She was taken to recovery room in stable  condition.  All final counts were correct.  There were no complications noted.  Postoperatively, she will be discharged home from the PACU with wound care instructions and followup instructions.  TN/NUANCE  D:06/08/2019 T:06/08/2019 JOB:[id number]

## 2019-06-09 ENCOUNTER — Telehealth: Payer: Self-pay | Admitting: Orthopaedic Surgery

## 2019-06-09 ENCOUNTER — Encounter: Payer: Self-pay | Admitting: *Deleted

## 2019-06-09 NOTE — Telephone Encounter (Signed)
Patient called and requested a call back about FMLA paperwork being received at Dr. Trevor Mace office. Patient stated to have several question and asked for nurse to call back. Patient phone number is [phone].

## 2019-06-09 NOTE — Telephone Encounter (Signed)
Can you give this to the appropriate person to help patient with this question? Thank you!

## 2019-06-14 ENCOUNTER — Telehealth: Payer: Self-pay

## 2019-06-14 ENCOUNTER — Encounter: Payer: Self-pay | Admitting: Orthopaedic Surgery

## 2019-06-14 NOTE — Telephone Encounter (Signed)
Patient called concerning her incision, left CTR on Tuesday, 06/08/2019.  Stated that incision looked open and wanted to know it that was normal.  Talked with Caryl Pina, Dr. Trevor Mace assistant and advised her of message above and that patient had also attached a picture.   Advised per Dr. Ninfa Linden for patient to use triple antibiotic, can shower, and wash with dial soap, incision looks fine.   Advised patient of Dr. Trevor Mace message and she voiced that she understands.

## 2019-06-22 ENCOUNTER — Encounter: Payer: Self-pay | Admitting: Orthopaedic Surgery

## 2019-06-22 ENCOUNTER — Ambulatory Visit (INDEPENDENT_AMBULATORY_CARE_PROVIDER_SITE_OTHER): Payer: 59 | Admitting: Orthopaedic Surgery

## 2019-06-22 DIAGNOSIS — Z9889 Other specified postprocedural states: Secondary | ICD-10-CM

## 2019-06-22 NOTE — Progress Notes (Signed)
The patient comes in today for follow-up 2 weeks status post a left open carpal tunnel release.  She says she is doing well overall.  On exam I remove the sutures in place Steri-Strips were left hand.  There are some tenderness in her palm at the surgical site which is to be expected but there is no numbness and tingling in her fingers or thumb.  She is able to flex and extend her fingers and thumb as well.  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  All question concerns were answered and addressed.  I will see her back in 4 weeks to make sure she is doing well.

## 2019-06-23 ENCOUNTER — Other Ambulatory Visit: Payer: Self-pay

## 2019-06-23 ENCOUNTER — Ambulatory Visit (INDEPENDENT_AMBULATORY_CARE_PROVIDER_SITE_OTHER): Payer: 59 | Admitting: Internal Medicine

## 2019-06-23 ENCOUNTER — Encounter: Payer: Self-pay | Admitting: Internal Medicine

## 2019-06-23 VITALS — BP 120/70 | HR 64 | Ht 62.0 in | Wt 230.5 lb

## 2019-06-23 DIAGNOSIS — R0602 Shortness of breath: Secondary | ICD-10-CM | POA: Diagnosis not present

## 2019-06-23 DIAGNOSIS — I10 Essential (primary) hypertension: Secondary | ICD-10-CM | POA: Diagnosis not present

## 2019-06-23 NOTE — Patient Instructions (Signed)
Medication Instructions:  Your physician recommends that you continue on your current medications as directed. Please refer to the Current Medication list given to you today.  *If you need a refill on your cardiac medications before your next appointment, please call your pharmacy*  Lab Work: none If you have labs (blood work) drawn today and your tests are completely normal, you will receive your results only by: Marland Kitchen MyChart Message (if you have MyChart) OR . A paper copy in the mail If you have any lab test that is abnormal or we need to change your treatment, we will call you to review the results.  Testing/Procedures: none  Follow-Up: At [facility], you and your health needs are our priority.  As part of our continuing mission to provide you with exceptional heart care, we have created designated Provider Care Teams.  These Care Teams include your primary Cardiologist (physician) and Advanced Practice Providers (APPs -  Physician Assistants and Nurse Practitioners) who all work together to provide you with the care you need, when you need it.  Your next appointment:   As needed.   The format for your next appointment:   In Person  Provider:    You may see Nelva Bush, MD or one of the following Advanced Practice Providers on your designated Care Team:    Murray Hodgkins, NP  Christell Faith, PA-C  Marrianne Mood, PA-C

## 2019-06-23 NOTE — Progress Notes (Signed)
Follow-up Outpatient Visit Date: 06/23/2019  Primary Care Provider: Steele Sizer, [address] [place] [address]  Chief Complaint: Follow-up shortness of breath  HPI:  Jasmine Woods is a 63 y.o. female with history of myasthenia gravis, DM, HTN, HLD, multinodular goiter, asthma, bilateral carpal tunnel syndrome, morbid obesity s/p bariatric surgery, and OSA, who presents for follow-up of exertional dyspnea.  She was last seen in late October by Christell Faith, PA, at which time she was doing ok.  Given potential for HFpEF and bilateral carpal tunnel syndrome, she was referred for cardiac MRI.  This showed no evidence of amyloidosis.  Today, [name] reports that she is doing quite well. Her breathing has continued to improve with use of the [facility] inhaler.  She denies chest pain, palpitations, lightheadedness, orthopnea, PND, and edema.  --------------------------------------------------------------------------------------------------  Past Medical History:  Diagnosis Date  . Allergy   . Anxiety   . Asthma   . Bladder polyp    Dr. Ernst Spell  . Carpal tunnel syndrome   . Complication of anesthesia    needed nebulizer after surgery  . Depression   . Diabetes mellitus without complication ([place])    Diet controlled  . Double vision    seen by Dr. 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 Dyslipidemia   . Frequency of urination   . Glaucoma, left eye   . H/O myasthenia gravis    MOSTLY AFFECTS EYES  . HA (headache)   . Heart murmur    followed by PCP  . Hx of bariatric surgery   . Hypertension    no meds  . Internal hemorrhoids   . Myasthenia gravis ([place])    "mostly effects eyes"  . Myasthenia gravis ([place])   . Neuropathy, generalized    diabetic on feet  . Nuclear sclerotic cataract of both eyes   . Obesity (BMI 30-39.9)   . Obstructive sleep apnea on CPAP    uses cpap  . Patient is Jehovah's Witness    no blood products  . Rotator cuff  tendonitis 07/2009   also has impingment right shoulder pain, seen by Dr. Mack Guise  . Sleep apnea   . Thyroid nodule   . Vaginal dryness, menopausal   . Vitamin D deficiency    Past Surgical History:  Procedure Laterality Date  . ABDOMINAL HYSTERECTOMY  1991  . [place] SURGERY  2014  . CARPAL TUNNEL RELEASE Left 06/08/2019   Procedure: LEFT CARPAL TUNNEL RELEASE;  Surgeon: Mcarthur Rossetti, MD;  Location: [facility];  Service: Orthopedics;  Laterality: Left;  . CATARACT EXTRACTION W/PHACO Left 01/28/2017   Procedure: CATARACT EXTRACTION PHACO AND INTRAOCULAR LENS PLACEMENT (IOC);  Surgeon: Birder Robson, MD;  Location: [facility];  Service: Ophthalmology;  Laterality: Left;  [place] 00:37 AP% 17.8 CDE 6.61 Fluid pack lot # [id number]  . COLONOSCOPY  2012  . COLONOSCOPY WITH PROPOFOL N/A 09/30/2016   Procedure: COLONOSCOPY WITH PROPOFOL;  Surgeon: Lucilla Lame, MD;  Location: [facility];  Service: Endoscopy;  Laterality: N/A;  diabetic - diet controlled sleep apnea  . KNEE SURGERY Right 1979-1980  . OOPHORECTOMY    . PARS PLANA VITRECTOMY Left 03/27/2016   Procedure: PARS PLANA VITRECTOMY WITH 25 GAUGE AND MEMBRANE PEEL, INTERNAL LIMITING MEMBRANE;  Surgeon: Hurman Horn, MD;  Location: [facility];  Service: Ophthalmology;  Laterality: Left;  . THYROID SURGERY     BX done was benign  . UPPER GI ENDOSCOPY    .  VITRECTOMY Left 04/27/2016     Recent CV Pertinent Labs: Lab Results  Component Value Date   CHOL 184 11/27/2018   CHOL 175 03/05/2018   CHOL 153 05/08/2012   HDL 67 11/27/2018   HDL 61 03/05/2018   HDL 46 05/08/2012   LDLCALC 99 11/27/2018   LDLCALC 77 05/08/2012   TRIG 90 11/27/2018   TRIG 152 05/08/2012   CHOLHDL 2.7 11/27/2018   INR 0.9 05/18/2012   K 3.9 06/04/2019   K 4.4 10/17/2014   MG 2.1 05/19/2013   BUN 16 06/04/2019   BUN 17 04/22/2019   BUN 21 (H) 10/17/2014   CREATININE 0.64 06/04/2019   CREATININE 0.62 10/17/2017    Past  medical and surgical history were reviewed and updated in EPIC.  Current Meds  Medication Sig  . ACCU-CHEK GUIDE test strip USE AS DIRECTED  . acetaminophen (TYLENOL) 500 MG tablet Take 1,000 mg by mouth every 6 (six) hours as needed for mild pain. Reported on 07/06/2015  . brimonidine-timolol (COMBIGAN) 0.2-0.5 % ophthalmic solution 1 drop daily.   . budesonide (RHINOCORT AQUA) 32 MCG/ACT nasal spray Place 2 sprays into both nostrils at bedtime.  . Coenzyme Q10 (CO Q 10) 100 MG CAPS Take 1 capsule by mouth daily.  . Dulaglutide (TRULICITY) 1.5 0000000 SOPN Inject 1.5 mg into the skin once a week.  . DULoxetine (CYMBALTA) 60 MG capsule Take 1 capsule (60 mg total) by mouth daily.  . fluticasone furoate-vilanterol (BREO ELLIPTA) 200-25 MCG/INH AEPB Inhale 1 puff into the lungs daily. Rinse mouth after use.  . latanoprost (XALATAN) 0.005 % ophthalmic solution Place 1 drop into the left eye at bedtime.   Marland Kitchen loratadine (CLARITIN) 10 MG tablet Take 1 tablet (10 mg total) by mouth 2 (two) times daily. (Patient taking differently: Take 10 mg by mouth daily. )  . montelukast (SINGULAIR) 10 MG tablet Take 1 tablet (10 mg total) by mouth at bedtime.  . Multiple Vitamins-Minerals (MULTIVITAMIN WITH MINERALS) tablet Take 1 tablet by mouth daily.  . Naltrexone-buPROPion HCl ER (CONTRAVE) 8-90 MG TB12 Take 2 tablets by mouth 2 (two) times daily.  . Omega-3 Fatty Acids (FISH OIL) 1200 MG CAPS Take 2 capsules by mouth daily.  Marland Kitchen pyridostigmine (MESTINON) 60 MG tablet Take 1 tablet at 6:30a, 11a, and 3:30p.  OK to take extra dose as needed.  . rosuvastatin (CRESTOR) 5 MG tablet Take 1 tablet (5 mg total) by mouth daily.  Marland Kitchen telmisartan-hydrochlorothiazide (MICARDIS HCT) 80-25 MG tablet Take 1 tablet by mouth daily.  . VENTOLIN HFA 108 (90 Base) MCG/ACT inhaler INHALE 2 PUFFS BY MOUTH EVERY 4 HOURS AS NEEDED SHORTNESS OF BREATH/WHEEZING    Allergies: Aspirin, Azithromycin, Ace inhibitors, Amlodipine, Gabapentin,  and Pregabalin  Social History   Tobacco Use  . Smoking status: Former Smoker    Packs/day: 0.50    Years: 3.00    Pack years: 1.50    Start date: 06/24/1972    Quit date: 06/25/1975    Years since quitting: 44.0  . Smokeless tobacco: Never Used  . Tobacco comment: smoked as teenager  Substance Use Topics  . Alcohol use: No    Alcohol/week: 0.0 standard drinks  . Drug use: No    Family History  Problem Relation Age of Onset  . Diabetes Mother   . Hypertension Mother   . Heart disease Mother 92       stent x 1   . Migraines Sister   . Stroke Maternal Grandfather   .  Diabetes Paternal Grandfather   . Muscular dystrophy Sister   . Heart attack Maternal Aunt   . Breast cancer Neg Hx   . Thyroid disease Neg Hx     Review of Systems: A 12-system review of systems was performed and was negative except as noted in the HPI.  --------------------------------------------------------------------------------------------------  Physical Exam: BP 120/70 (BP Location: Left Arm, Patient Position: Sitting, Cuff Size: Large)   Pulse 64   Ht 5\' 2"  (1.575 m)   Wt 230 lb 8 oz (104.6 kg)   SpO2 98%   BMI 42.16 kg/m   General:  NAD. HEENT: No conjunctival pallor or scleral icterus. Facemask in place. Neck: Supple without lymphadenopathy, thyromegaly, JVD, or HJR, though evaluation is limited by body habitus Lungs: Normal work of breathing. Clear to auscultation bilaterally without wheezes or crackles. Heart: Regular rate and rhythm without murmurs, rubs, or gallops. Abd: Bowel sounds present. Soft, NT/ND.  Unable to assess HSM due to body habitus. Ext: No lower extremity edema. Skin: Warm and dry without rash.  EKG:  NSR without abnormality.  Lab Results  Component Value Date   WBC 10.5 10/29/2018   HGB 13.6 10/29/2018   HCT 39.7 10/29/2018   MCV 88.0 10/29/2018   PLT 295 10/29/2018    Lab Results  Component Value Date   NA 138 06/04/2019   K 3.9 06/04/2019   CL 103  06/04/2019   CO2 26 06/04/2019   BUN 16 06/04/2019   CREATININE 0.64 06/04/2019   GLUCOSE 84 06/04/2019   ALT 13 03/05/2018    Lab Results  Component Value Date   CHOL 184 11/27/2018   HDL 67 11/27/2018   LDLCALC 99 11/27/2018   TRIG 90 11/27/2018   CHOLHDL 2.7 11/27/2018    --------------------------------------------------------------------------------------------------  ASSESSMENT AND PLAN: Shortness of breath: This has continued to improve with regular use of Breo inhaler.  I have encouraged Ms. Kassem to walk and to lose weight, as I suspect that deconditioning and obesity are contributing to her symptoms.  Recent cardiac MRI was unremarkable without evidence of infiltrative process.  No further cardiac workup is recommended at this time.  Hypertension: BP controlled today.  Continued management per [name].  Morbid obesity: Weight loss encouraged through diet and exercise.  Follow-up: Return to clinic as needed.  Nelva Bush, MD 06/23/2019 9:16 PM

## 2019-06-24 ENCOUNTER — Telehealth: Payer: Self-pay | Admitting: Orthopaedic Surgery

## 2019-06-24 NOTE — Telephone Encounter (Signed)
Have you seen this form by chance? Or maybe it was sent to [facility]?

## 2019-06-24 NOTE — Telephone Encounter (Signed)
Completed and faxed to her work, patient aware

## 2019-06-24 NOTE — Telephone Encounter (Signed)
I haven't seen anything. Nothing currently on fax. Nothing logged in with [employer].

## 2019-06-24 NOTE — Telephone Encounter (Signed)
Patient called. She needs a return to work form filled out. She faxed them over as her job has a specific form they need.   Call back number: [phone]

## 2019-07-05 DIAGNOSIS — H401122 Primary open-angle glaucoma, left eye, moderate stage: Secondary | ICD-10-CM | POA: Diagnosis not present

## 2019-07-12 DIAGNOSIS — H26492 Other secondary cataract, left eye: Secondary | ICD-10-CM | POA: Diagnosis not present

## 2019-07-20 ENCOUNTER — Ambulatory Visit (INDEPENDENT_AMBULATORY_CARE_PROVIDER_SITE_OTHER): Payer: 59 | Admitting: Orthopaedic Surgery

## 2019-07-20 ENCOUNTER — Encounter: Payer: Self-pay | Admitting: Orthopaedic Surgery

## 2019-07-20 ENCOUNTER — Other Ambulatory Visit: Payer: Self-pay

## 2019-07-20 DIAGNOSIS — G5601 Carpal tunnel syndrome, right upper limb: Secondary | ICD-10-CM

## 2019-07-20 DIAGNOSIS — Z9889 Other specified postprocedural states: Secondary | ICD-10-CM

## 2019-07-20 NOTE — Progress Notes (Signed)
HPI: Jasmine Woods returns today 6 weeks status post left carpal tunnel release.  She is having no numbness tingling.  She does have some tenderness around the incision site.  She has known carpal tunnel syndrome of the right hand and will like to do this sometime in the future.  States that the right hand still awakens her due to the numbness she does wear splint at night.  Physical exam: Left hand surgical incisions well-healed.  Full range of motion of the fingers.  Subjective full sensation throughout the left hand.  Impression: Status post left carpal tunnel release 6 weeks Right carpal tunnel syndrome  Plan: She will work on scar tissue mobilization left hand.  Also urged her to pick up some Mederma and begin using this.  Regards to the right hand she will call us whenever she is ready for carpal tunnel release on the right side.  Follow-up 2 weeks postop.  Samella Parr card was given to the patient.

## 2019-08-04 ENCOUNTER — Ambulatory Visit: Payer: 59 | Admitting: Family Medicine

## 2019-08-04 ENCOUNTER — Other Ambulatory Visit: Payer: Self-pay

## 2019-08-04 ENCOUNTER — Encounter: Payer: Self-pay | Admitting: Family Medicine

## 2019-08-04 VITALS — BP 130/70 | HR 64 | Temp 96.9°F | Resp 16 | Ht 62.25 in | Wt 233.6 lb

## 2019-08-04 DIAGNOSIS — Z9884 Bariatric surgery status: Secondary | ICD-10-CM | POA: Diagnosis not present

## 2019-08-04 DIAGNOSIS — J3089 Other allergic rhinitis: Secondary | ICD-10-CM | POA: Diagnosis not present

## 2019-08-04 DIAGNOSIS — J4530 Mild persistent asthma, uncomplicated: Secondary | ICD-10-CM | POA: Diagnosis not present

## 2019-08-04 DIAGNOSIS — G629 Polyneuropathy, unspecified: Secondary | ICD-10-CM

## 2019-08-04 DIAGNOSIS — E042 Nontoxic multinodular goiter: Secondary | ICD-10-CM | POA: Diagnosis not present

## 2019-08-04 DIAGNOSIS — G4733 Obstructive sleep apnea (adult) (pediatric): Secondary | ICD-10-CM

## 2019-08-04 DIAGNOSIS — G7000 Myasthenia gravis without (acute) exacerbation: Secondary | ICD-10-CM

## 2019-08-04 DIAGNOSIS — E1140 Type 2 diabetes mellitus with diabetic neuropathy, unspecified: Secondary | ICD-10-CM | POA: Diagnosis not present

## 2019-08-04 DIAGNOSIS — E1169 Type 2 diabetes mellitus with other specified complication: Secondary | ICD-10-CM | POA: Diagnosis not present

## 2019-08-04 DIAGNOSIS — E785 Hyperlipidemia, unspecified: Secondary | ICD-10-CM

## 2019-08-04 DIAGNOSIS — G44221 Chronic tension-type headache, intractable: Secondary | ICD-10-CM

## 2019-08-04 DIAGNOSIS — E559 Vitamin D deficiency, unspecified: Secondary | ICD-10-CM | POA: Diagnosis not present

## 2019-08-04 MED ORDER — FLUTICASONE-SALMETEROL 100-50 MCG/DOSE IN AEPB: 1.0000 | INHALATION_SPRAY | Freq: Two times a day (BID) | RESPIRATORY_TRACT | 2 refills | Status: DC

## 2019-08-04 MED ORDER — TRULICITY 1.5 MG/0.5ML ~~LOC~~ SOAJ: 1.5000 mg | SUBCUTANEOUS | 2 refills | Status: DC

## 2019-08-04 MED ORDER — CONTRAVE 8-90 MG PO TB12: 2.0000 | ORAL_TABLET | Freq: Two times a day (BID) | ORAL | 2 refills | Status: DC

## 2019-08-04 MED ORDER — BUDESONIDE 32 MCG/ACT NA SUSP: 2.0000 | Freq: Every day | NASAL | 2 refills | Status: AC

## 2019-08-04 NOTE — Progress Notes (Signed)
Name: Jasmine Woods   MRN: [id number]    DOB: 23-Sep-1955   Date:08/04/2019       Progress Note  Subjective  Chief Complaint  Chief Complaint  Patient presents with  . Medication Refill  . Hypertension  . Diabetes  . Hyperlipidemia  . Asthma  . Myasthenia Gravis  . Sleep Apnea  . Carpal Tunnel    HPI  DMII:She was off medication afterbariatric surgery in 2014,but we re-started medication 0000000 - Trulicity,she denies polyphagia, polydipsia or polyuria. She has not been as active lately, steps are down from 5000 to 3000 steps daily. HgbA1C has been well controlled. Glucose at home has been well controlled 95-105 fasting Neuropathy is still 2/10, constant, burning-like on both feet, she is on Cymbalta. She could not tolerate gabapentin or Lyrica and also did not work.   Hyperlipidemia: takingCrestor, taking Co-Q 10 andhas noticed improvement ofmuscle aches, no chest pain or SOB. Last lipid panel was a to goal, LDL was 99 back in 11/2018, continue medications, we will recheck it today   Asthma:shewas seeing  Dr. Felicie Morn and PFT, she was placed on Breo and seems to work well for her, she has SOB with activity that may be multifactorial, she has not used rescue inhaler recently, doing well on maintenance medication.  No cough or wheezing.Symptoms stable  , she states Memory Dance is too expensive and we will try switching to Advair   Myasthenia Gravis: diagnosed at [facility] by Dr. Burnett Harry, 09/2015, seronegative, only causing ptosis and double vision, no other symptoms. She had a negative chest CT for thymoma, and has started on medication ( Mestinon ), she states it works quickly but wears out very fast. She takes before drivingto and until she gets home, takes 4 daily.Does not usually bother her to have double vision when at home.She is now seeing Dr. Posey Pronto Last   Chronic Tension Headaches: seen by Dr. Marney Setting wastaking Topamax 100 mgbut stopped because of  mental fogginess and is doing well even off medication. Unchanged    Abnormal CT thyroid: incidental finding of thyroid calcification of left lobe, she saw Endo and had negative biopsy., she had an US done one year ago, accidentally given levothyroxine by endo and caused TSH suppression,she is still under the care of Dr. Loanne Drilling , however last visit was telemedicine, and did not have labs done   Bariatric Surgery/Obesity: she had bariatric surgery back in 2014 , her weight was almost 300 lbs, went down to close to 200lbs, butwasgradually gaining weight,she was started on Trulicity 0000000 and 123XX123 lbs, butis gaining it back, she was 241 lbs July 2020 and we started contrave, she has lost 11 lbs on the medication ( 229 lbs ), she was out of medication for a while and gained some weight   OSA: shehas a new machine, rx written by Dr. Felicie Morn and she states she has been compliant and is doing well, stable   Carpal Tunnel syndrome: had left carpal tunnel release surgery 05/2019 and will have right side done soon, Dr . Ninfa Linden   SOB and diaphoresis: seeing cardiologist, last visit 03/2019 she saw PA Dunn, and on A/P he stated MRI would be indicated to rule out cardiac amyloidosis and it was negative, normal LV and EF   Patient Active Problem List   Diagnosis Date Noted  . Carpal tunnel syndrome, left upper limb 06/08/2019  . Multinodular goiter 11/25/2018  . Essential hypertension 11/11/2018  . DOE (dyspnea on exertion) 04/15/2018  . Hyperlipidemia 12/09/2017  .  Diaphoresis 11/26/2017  . Special screening for malignant neoplasms, colon   . Epiretinal membrane (ERM) of left eye 09/09/2016  . Glaucoma, left eye 09/05/2016  . Seronegative myasthenia gravis ([place]) 08/08/2015  . Psoriasis 07/06/2015  . Stress incontinence 07/06/2015  . Chronic tension-type headache, intractable 05/29/2015  . Anxiety and depression 04/12/2015  . Asthma, mild intermittent 04/12/2015  . Bladder  polyp 04/12/2015  . Carpal tunnel syndrome 04/12/2015  . Binocular vision disorder with diplopia 04/12/2015  . Dyslipidemia 04/12/2015  . H/O: HTN (hypertension) 04/12/2015  . Hemorrhoids, internal 04/12/2015  . Neuropathy 04/12/2015  . Nuclear sclerotic cataract 04/12/2015  . Morbid obesity ([place]) 04/12/2015  . Obstructive apnea 04/12/2015  . Perennial allergic rhinitis 04/12/2015  . 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 Type 2 diabetes, controlled, with neuropathy ([place]) 04/12/2015  . Vitamin D deficiency 04/12/2015  . Cephalalgia 08/16/2014  . Lump or mass in breast 06/14/2013  . Bariatric surgery status 10/12/2012    Past Surgical History:  Procedure Laterality Date  . ABDOMINAL HYSTERECTOMY  1991  . [place] SURGERY  2014  . CARPAL TUNNEL RELEASE Left 06/08/2019   Procedure: LEFT CARPAL TUNNEL RELEASE;  Surgeon: Mcarthur Rossetti, MD;  Location: [place] [facility];  Service: Orthopedics;  Laterality: Left;  . CATARACT EXTRACTION W/PHACO Left 01/28/2017   Procedure: CATARACT EXTRACTION PHACO AND INTRAOCULAR LENS PLACEMENT (IOC);  Surgeon: Birder Robson, MD;  Location: [facility];  Service: Ophthalmology;  Laterality: Left;  [place] 00:37 AP% 17.8 CDE 6.61 Fluid pack lot # [id number]  . COLONOSCOPY  2012  . COLONOSCOPY WITH PROPOFOL N/A 09/30/2016   Procedure: COLONOSCOPY WITH PROPOFOL;  Surgeon: Lucilla Lame, MD;  Location: [place];  Service: Endoscopy;  Laterality: N/A;  diabetic - diet controlled sleep apnea  . KNEE SURGERY Right 1979-1980  . OOPHORECTOMY    . PARS PLANA VITRECTOMY Left 03/27/2016   Procedure: PARS PLANA VITRECTOMY WITH 25 GAUGE AND MEMBRANE PEEL, INTERNAL LIMITING MEMBRANE;  Surgeon: Hurman Horn, MD;  Location: [facility];  Service: Ophthalmology;  Laterality: Left;  . THYROID SURGERY     BX done was benign  . UPPER GI ENDOSCOPY    . VITRECTOMY Left 04/27/2016    Family History  Problem Relation Age of Onset  .  Diabetes Mother   . Hypertension Mother   . Heart disease Mother 63       stent x 1   . Migraines Sister   . Stroke Maternal Grandfather   . Diabetes Paternal Grandfather   . Muscular dystrophy Sister   . Heart attack Maternal Aunt   . Breast cancer Neg Hx   . Thyroid disease Neg Hx     Social History   Tobacco Use  . Smoking status: Former Smoker    Packs/day: 0.50    Years: 3.00    Pack years: 1.50    Start date: 06/24/1972    Quit date: 06/25/1975    Years since quitting: 44.1  . Smokeless tobacco: Never Used  . Tobacco comment: smoked as teenager  Substance Use Topics  . Alcohol use: No    Alcohol/week: 0.0 standard drinks  . Drug use: No     Current Outpatient Medications:  .  ACCU-CHEK GUIDE test strip, USE AS DIRECTED, Disp: 100 each, Rfl: 12 .  acetaminophen (TYLENOL) 500 MG tablet, Take 1,000 mg by mouth every 6 (six) hours as needed for mild pain. Reported on 07/06/2015, Disp: , Rfl:  .  brimonidine-timolol (COMBIGAN) 0.2-0.5 %  ophthalmic solution, 1 drop daily. , Disp: , Rfl:  .  budesonide (RHINOCORT AQUA) 32 MCG/ACT nasal spray, Place 2 sprays into both nostrils at bedtime., Disp: 15 mL, Rfl: 2 .  Coenzyme Q10 (CO Q 10) 100 MG CAPS, Take 1 capsule by mouth daily., Disp: 30 capsule, Rfl: 2 .  Dulaglutide (TRULICITY) 1.5 0000000 SOPN, Inject 1.5 mg into the skin once a week., Disp: 4 pen, Rfl: 2 .  DULoxetine (CYMBALTA) 60 MG capsule, Take 1 capsule (60 mg total) by mouth daily., Disp: 90 capsule, Rfl: 1 .  fluticasone furoate-vilanterol (BREO ELLIPTA) 200-25 MCG/INH AEPB, Inhale 1 puff into the lungs daily. Rinse mouth after use., Disp: 1 each, Rfl: 5 .  latanoprost (XALATAN) 0.005 % ophthalmic solution, Place 1 drop into the left eye at bedtime. , Disp: , Rfl:  .  loratadine (CLARITIN) 10 MG tablet, Take 1 tablet (10 mg total) by mouth 2 (two) times daily. (Patient taking differently: Take 10 mg by mouth daily. ), Disp: 60 tablet, Rfl: 0 .  montelukast (SINGULAIR)  10 MG tablet, Take 1 tablet (10 mg total) by mouth at bedtime., Disp: 90 tablet, Rfl: 1 .  Multiple Vitamins-Minerals (MULTIVITAMIN WITH MINERALS) tablet, Take 1 tablet by mouth daily., Disp: , Rfl:  .  Naltrexone-buPROPion HCl ER (CONTRAVE) 8-90 MG TB12, Take 2 tablets by mouth 2 (two) times daily., Disp: 120 tablet, Rfl: 2 .  Omega-3 Fatty Acids (FISH OIL) 1200 MG CAPS, Take 2 capsules by mouth daily., Disp: , Rfl:  .  pyridostigmine (MESTINON) 60 MG tablet, Take 1 tablet at 6:30a, 11a, and 3:30p.  OK to take extra dose as needed., Disp: 360 tablet, Rfl: 3 .  rosuvastatin (CRESTOR) 5 MG tablet, Take 1 tablet (5 mg total) by mouth daily., Disp: 90 tablet, Rfl: 1 .  telmisartan-hydrochlorothiazide (MICARDIS HCT) 80-25 MG tablet, Take 1 tablet by mouth daily., Disp: 90 tablet, Rfl: 3 .  VENTOLIN HFA 108 (90 Base) MCG/ACT inhaler, INHALE 2 PUFFS BY MOUTH EVERY 4 HOURS AS NEEDED SHORTNESS OF BREATH/WHEEZING, Disp: 18 g, Rfl: 0  Allergies  Allergen Reactions  . Aspirin Swelling    Upset stomach  Upset stomach   . Azithromycin     Contraindicated in Myasthenia   . Ace Inhibitors     cough  . Amlodipine     Edema of feet and hands  . Gabapentin     anxiety  . Pregabalin Other (See Comments)    anxiety    I personally reviewed active problem list, medication list, allergies, family history, social history, health maintenance with the patient/caregiver today.   ROS  Constitutional: Negative for fever or weight change.  Respiratory: Negative for cough and shortness of breath.   Cardiovascular: Negative for chest pain or palpitations.  Gastrointestinal: Negative for abdominal pain, no bowel changes.  Musculoskeletal: Negative for gait problem or joint swelling.  Skin: Negative for rash.  Neurological: Negative for dizziness or headache.  No other specific complaints in a complete review of systems (except as listed in HPI above).   Objective  Vitals:   08/04/19 1509  BP: 130/70   Pulse: 64  Resp: 16  Temp: (!) 96.9 F (36.1 C)  TempSrc: Temporal  SpO2: 98%  Weight: 233 lb 9.6 oz (106 kg)  Height: 5' 2.25" (1.581 m)    Body mass index is 42.38 kg/m.  Physical Exam  Constitutional: Patient appears well-developed and well-nourished. Obese  No distress.  HEENT: head atraumatic, normocephalic, pupils equal and reactive to  light Cardiovascular: Normal rate, regular rhythm and normal heart sounds.  No murmur heard. No BLE edema. Pulmonary/Chest: Effort normal and breath sounds normal. No respiratory distress. Abdominal: Soft.  There is no tenderness. Psychiatric: Patient has a normal mood and affect. behavior is normal. Judgment and thought content normal.  Recent Results (from the past 2160 hour(s))  Basic metabolic panel     Status: None   Collection Time: 06/04/19  9:30 AM  Result Value Ref Range   Sodium 138 135 - 145 mmol/L   Potassium 3.9 3.5 - 5.1 mmol/L   Chloride 103 98 - 111 mmol/L   CO2 26 22 - 32 mmol/L   Glucose, Bld 84 70 - 99 mg/dL   BUN 16 8 - 23 mg/dL   Creatinine, Ser 0.64 0.44 - 1.00 mg/dL   Calcium 9.3 8.9 - 10.3 mg/dL   GFR calc non Af Amer >60 >60 mL/min   GFR calc Af Amer >60 >60 mL/min   Anion gap 9 5 - 15    Comment: Performed at [facility] [address] [place], [place] [address]  Novel Coronavirus, NAA (Hosp order, Send-out to Ref Lab; TAT 18-24 hrs     Status: None   Collection Time: 06/04/19  9:42 AM   Specimen: Nasopharyngeal Swab; Respiratory  Result Value Ref Range   SARS-CoV-2, NAA NOT DETECTED NOT DETECTED    Comment: (NOTE) This nucleic acid amplification test was developed and its performance characteristics determined by [employer]. Nucleic acid amplification tests include PCR and TMA. This test has not been FDA cleared or approved. This test has been authorized by FDA under an Emergency Use Authorization (EUA). This test is only authorized for the duration of time the declaration  that circumstances exist justifying the authorization of the emergency use of in vitro diagnostic tests for detection of SARS-CoV-2 virus and/or diagnosis of COVID-19 infection under section 564(b)(1) of the Act, 21 U.S.C. [id number]) (1), unless the authorization is terminated or revoked sooner. When diagnostic testing is negative, the possibility of a false negative result should be considered in the context of a patient's recent exposures and the presence of clinical signs and symptoms consistent with COVID-19. An individual without symptoms of COVID- 19 and who is not shedding SARS-CoV-2 vi rus would expect to have a negative (not detected) result in this assay. Performed At: [facility] [place], [place] [id number] Rush Farmer MD [id number]    Coronavirus Source NASOPHARYNGEAL     Comment: Performed at [facility], [place] [address] [place], [place] [address]  Glucose, capillary     Status: None   Collection Time: 06/08/19  7:23 AM  Result Value Ref Range   Glucose-Capillary 79 70 - 99 mg/dL  Glucose, capillary     Status: None   Collection Time: 06/08/19  8:02 AM  Result Value Ref Range   Glucose-Capillary 88 70 - 99 mg/dL     PHQ2/9: Depression screen [facility] 2/9 08/04/2019 05/05/2019 01/18/2019 11/27/2018 09/22/2018  Decreased Interest 0 0 0 1 0  Down, Depressed, Hopeless 0 0 0 0 0  PHQ - 2 Score 0 0 0 1 0  Altered sleeping 0 0 0 0 1  Tired, decreased energy 1 0 1 1 0  Change in appetite 1 0 1 1 0  Feeling bad or failure about yourself  0 0 0 0 0  Trouble concentrating 0 0 0 0 1  Moving slowly or fidgety/restless 0 0 0 0  0  Suicidal thoughts 0 0 0 0 0  PHQ-9 Score 2 0 2 3 2   Difficult doing work/chores Not difficult at all - - - Not difficult at all  Some recent data might be hidden    phq 9 is negative   Fall Risk: Fall Risk  08/04/2019 05/05/2019 04/08/2019 02/24/2019 01/18/2019  Falls in the past year? 0 0 1 1 0  Number falls in past  yr: 0 0 1 1 0  Injury with Fall? 0 0 0 0 0    Functional Status Survey: Is the patient deaf or have difficulty hearing?: No Does the patient have difficulty seeing, even when wearing glasses/contacts?: Yes Does the patient have difficulty concentrating, remembering, or making decisions?: No Does the patient have difficulty walking or climbing stairs?: No Does the patient have difficulty dressing or bathing?: No Does the patient have difficulty doing errands alone such as visiting a doctor's office or shopping?: No   Assessment & Plan  1. Type 2 diabetes, controlled, with neuropathy (HCC)  - COMPLETE METABOLIC PANEL WITH GFR - Hemoglobin A1c - Dulaglutide (TRULICITY) 1.5 0000000 SOPN; Inject 1.5 mg into the skin once a week.  Dispense: 4 pen; Refill: 2  2. Dyslipidemia  - Lipid panel  3. Morbid obesity, unspecified obesity type ([place])  - Naltrexone-buPROPion HCl ER (CONTRAVE) 8-90 MG TB12; Take 2 tablets by mouth 2 (two) times daily.  Dispense: 120 tablet; Refill: 2  4. Seronegative myasthenia gravis ([place])   5. Peripheral polyneuropathy   6. Perennial allergic rhinitis  - budesonide (RHINOCORT AQUA) 32 MCG/ACT nasal spray; Place 2 sprays into both nostrils at bedtime.  Dispense: 15 mL; Refill: 2  7. Dyslipidemia associated with type 2 diabetes mellitus ([place])   8. Mild persistent asthma without complication  - Fluticasone-Salmeterol (ADVAIR) 100-50 MCG/DOSE AEPB; Inhale 1 puff into the lungs 2 (two) times daily.  Dispense: 1 each; Refill: 2  9. Obstructive apnea  - CBC with Differential/Platelet  10. Chronic tension-type headache, intractable   11. Vitamin D deficiency  - VITAMIN D 25 Hydroxy (Vit-D Deficiency, Fractures)  12. Multiple thyroid nodules  - Thyroid Panel With TSH  13. History of bariatric surgery  - Vitamin B12

## 2019-08-05 LAB — LIPID PANEL
Cholesterol: 237 mg/dL — ABNORMAL HIGH (ref <200)
HDL: 72 mg/dL (ref >=50)
LDL Cholesterol (Calc): 139 mg/dL (calc) — ABNORMAL HIGH
Non-HDL Cholesterol (Calc): 165 mg/dL (calc) — ABNORMAL HIGH (ref <130)
Total CHOL/HDL Ratio: 3.3 (calc) (ref <5.0)
Triglycerides: 134 mg/dL (ref <150)

## 2019-08-05 LAB — CBC WITH DIFFERENTIAL/PLATELET
Absolute Monocytes: 840 cells/uL (ref 200–950)
Basophils Absolute: 45 cells/uL (ref 0–200)
Basophils Relative: 0.4 %
Eosinophils Absolute: 179 cells/uL (ref 15–500)
Eosinophils Relative: 1.6 %
HCT: 37.2 % (ref 35.0–45.0)
Hemoglobin: 12.8 g/dL (ref 11.7–15.5)
Lymphs Abs: 4155 cells/uL — ABNORMAL HIGH (ref 850–3900)
MCH: 30 pg (ref 27.0–33.0)
MCHC: 34.4 g/dL (ref 32.0–36.0)
MCV: 87.1 fL (ref 80.0–100.0)
MPV: 10.1 fL (ref 7.5–12.5)
Monocytes Relative: 7.5 %
Neutro Abs: 5981 cells/uL (ref 1500–7800)
Neutrophils Relative %: 53.4 %
Platelets: 301 10*3/uL (ref 140–400)
RBC: 4.27 10*6/uL (ref 3.80–5.10)
RDW: 12.5 % (ref 11.0–15.0)
Total Lymphocyte: 37.1 %
WBC: 11.2 10*3/uL — ABNORMAL HIGH (ref 3.8–10.8)

## 2019-08-05 LAB — COMPLETE METABOLIC PANEL WITH GFR
AG Ratio: 1.6 (calc) (ref 1.0–2.5)
ALT: 25 U/L (ref 6–29)
AST: 22 U/L (ref 10–35)
Albumin: 4.2 g/dL (ref 3.6–5.1)
Alkaline phosphatase (APISO): 40 U/L (ref 37–153)
BUN: 17 mg/dL (ref 7–25)
CO2: 27 mmol/L (ref 20–32)
Calcium: 9.3 mg/dL (ref 8.6–10.4)
Chloride: 103 mmol/L (ref 98–110)
Creat: 0.57 mg/dL (ref 0.50–0.99)
GFR, Est African American: 114 mL/min/{1.73_m2} (ref >=60)
GFR, Est Non African American: 98 mL/min/{1.73_m2} (ref >=60)
Globulin: 2.7 g/dL (calc) (ref 1.9–3.7)
Glucose, Bld: 81 mg/dL (ref 65–99)
Potassium: 4 mmol/L (ref 3.5–5.3)
Sodium: 138 mmol/L (ref 135–146)
Total Bilirubin: 0.4 mg/dL (ref 0.2–1.2)
Total Protein: 6.9 g/dL (ref 6.1–8.1)

## 2019-08-05 LAB — VITAMIN D 25 HYDROXY (VIT D DEFICIENCY, FRACTURES): Vit D, 25-Hydroxy: 15 ng/mL — ABNORMAL LOW (ref 30–100)

## 2019-08-05 LAB — THYROID PANEL WITH TSH
Free Thyroxine Index: 2.1 (ref 1.4–3.8)
T3 Uptake: 27 % (ref 22–35)
T4, Total: 7.8 ug/dL (ref 5.1–11.9)
TSH: 1 mIU/L (ref 0.40–4.50)

## 2019-08-05 LAB — HEMOGLOBIN A1C
Hgb A1c MFr Bld: 5.6 % of total Hgb (ref <5.7)
Mean Plasma Glucose: 114 (calc)
eAG (mmol/L): 6.3 (calc)

## 2019-08-05 LAB — VITAMIN B12: Vitamin B-12: 445 pg/mL (ref 200–1100)

## 2019-08-09 ENCOUNTER — Telehealth: Payer: Self-pay | Admitting: Family Medicine

## 2019-08-09 ENCOUNTER — Other Ambulatory Visit: Payer: Self-pay | Admitting: Family Medicine

## 2019-08-09 DIAGNOSIS — D72829 Elevated white blood cell count, unspecified: Secondary | ICD-10-CM

## 2019-08-09 NOTE — Telephone Encounter (Signed)
Patient is calling to see if orders can be placed to redo her cbc Please advise CB- [phone]

## 2019-08-09 NOTE — Telephone Encounter (Signed)
Patient informed to come by Wednesday to recheck her CBC. Lab order is upfront and ready for her.

## 2019-08-11 ENCOUNTER — Other Ambulatory Visit: Payer: Self-pay | Admitting: Physician Assistant

## 2019-08-11 DIAGNOSIS — D72829 Elevated white blood cell count, unspecified: Secondary | ICD-10-CM | POA: Diagnosis not present

## 2019-08-11 LAB — CBC
HCT: 40 % (ref 35.0–45.0)
Hemoglobin: 13.5 g/dL (ref 11.7–15.5)
MCH: 29.7 pg (ref 27.0–33.0)
MCHC: 33.8 g/dL (ref 32.0–36.0)
MCV: 87.9 fL (ref 80.0–100.0)
MPV: 10.1 fL (ref 7.5–12.5)
Platelets: 345 10*3/uL (ref 140–400)
RBC: 4.55 10*6/uL (ref 3.80–5.10)
RDW: 12.5 % (ref 11.0–15.0)
WBC: 12.4 10*3/uL — ABNORMAL HIGH (ref 3.8–10.8)

## 2019-08-12 ENCOUNTER — Other Ambulatory Visit: Payer: Self-pay

## 2019-08-12 ENCOUNTER — Encounter: Payer: Self-pay | Admitting: Family Medicine

## 2019-08-12 ENCOUNTER — Encounter (HOSPITAL_BASED_OUTPATIENT_CLINIC_OR_DEPARTMENT_OTHER): Payer: Self-pay | Admitting: Orthopaedic Surgery

## 2019-08-12 NOTE — Progress Notes (Signed)
chart reviewed by Dr. Sabra Heck due to patient history  and will proceed with surgery as scheduled.

## 2019-08-13 ENCOUNTER — Ambulatory Visit: Payer: 59 | Admitting: Family Medicine

## 2019-08-13 ENCOUNTER — Encounter: Payer: Self-pay | Admitting: Family Medicine

## 2019-08-13 VITALS — BP 126/84 | HR 71 | Temp 97.5°F | Resp 16 | Ht 62.25 in | Wt 232.7 lb

## 2019-08-13 DIAGNOSIS — D72829 Elevated white blood cell count, unspecified: Secondary | ICD-10-CM

## 2019-08-13 DIAGNOSIS — Z87440 Personal history of urinary (tract) infections: Secondary | ICD-10-CM | POA: Diagnosis not present

## 2019-08-13 DIAGNOSIS — H6983 Other specified disorders of Eustachian tube, bilateral: Secondary | ICD-10-CM

## 2019-08-13 DIAGNOSIS — Z87448 Personal history of other diseases of urinary system: Secondary | ICD-10-CM

## 2019-08-13 NOTE — Progress Notes (Signed)
Name: Jasmine Woods   MRN: [id number]    DOB: Oct 14, 1955   Date:08/13/2019       Progress Note  Subjective  Chief Complaint  Chief Complaint  Patient presents with  . elevated white blood cell count  . Joint Swelling    October 2016 she had pseudogout, her right knee has been painful and swollen  . Ear Fullness    Worst in the morning when she gets up-bilateral ear have a pulsating sound     HPI  Leucocytosis: she came in today so we can try to find a cause. She denies rashes, cough, change in bowel movements sore throat, dysuria or hematuria. No fever, chills or change in appetite. No change in energy level. Only two concerns because the carpal tunnel symptoms that she will have surgery for next week are ear pressure and tinnitus in am's ( not using nasal spray) and right knee pain when walking, but nothing acute or that has changed.    Patient Active Problem List   Diagnosis Date Noted  . Carpal tunnel syndrome, left upper limb 06/08/2019  . Multinodular goiter 11/25/2018  . Essential hypertension 11/11/2018  . DOE (dyspnea on exertion) 04/15/2018  . Hyperlipidemia 12/09/2017  . Diaphoresis 11/26/2017  . Special screening for malignant neoplasms, colon   . Epiretinal membrane (ERM) of left eye 09/09/2016  . Glaucoma, left eye 09/05/2016  . Seronegative myasthenia gravis ([place]) 08/08/2015  . Psoriasis 07/06/2015  . Stress incontinence 07/06/2015  . Chronic tension-type headache, intractable 05/29/2015  . Anxiety and depression 04/12/2015  . Asthma, mild intermittent 04/12/2015  . Bladder polyp 04/12/2015  . Carpal tunnel syndrome 04/12/2015  . Binocular vision disorder with diplopia 04/12/2015  . Dyslipidemia 04/12/2015  . H/O: HTN (hypertension) 04/12/2015  . Hemorrhoids, internal 04/12/2015  . Neuropathy 04/12/2015  . Nuclear sclerotic cataract 04/12/2015  . Morbid obesity ([place]) 04/12/2015  . Obstructive apnea 04/12/2015  . Perennial allergic rhinitis  04/12/2015  . 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 Type 2 diabetes, controlled, with neuropathy ([place]) 04/12/2015  . Vitamin D deficiency 04/12/2015  . Cephalalgia 08/16/2014  . Lump or mass in breast 06/14/2013  . Bariatric surgery status 10/12/2012    Social History   Tobacco Use  . Smoking status: Former Smoker    Packs/day: 0.50    Years: 3.00    Pack years: 1.50    Start date: 06/24/1972    Quit date: 06/25/1975    Years since quitting: 44.1  . Smokeless tobacco: Never Used  . Tobacco comment: smoked as teenager  Substance Use Topics  . Alcohol use: No    Alcohol/week: 0.0 standard drinks  . Drug use: No     Current Outpatient Medications:  .  ACCU-CHEK GUIDE test strip, USE AS DIRECTED, Disp: 100 each, Rfl: 12 .  acetaminophen (TYLENOL) 500 MG tablet, Take 1,000 mg by mouth every 6 (six) hours as needed for mild pain. Reported on 07/06/2015, Disp: , Rfl:  .  brimonidine-timolol (COMBIGAN) 0.2-0.5 % ophthalmic solution, 1 drop daily. , Disp: , Rfl:  .  budesonide (RHINOCORT AQUA) 32 MCG/ACT nasal spray, Place 2 sprays into both nostrils at bedtime., Disp: 15 mL, Rfl: 2 .  Coenzyme Q10 (CO Q 10) 100 MG CAPS, Take 1 capsule by mouth daily., Disp: 30 capsule, Rfl: 2 .  Dulaglutide (TRULICITY) 1.5 0000000 SOPN, Inject 1.5 mg into the skin once a week., Disp: 4 pen, Rfl: 2 .  DULoxetine (CYMBALTA) 60 MG capsule, Take  1 capsule (60 mg total) by mouth daily., Disp: 90 capsule, Rfl: 1 .  Fluticasone-Salmeterol (ADVAIR) 100-50 MCG/DOSE AEPB, Inhale 1 puff into the lungs 2 (two) times daily., Disp: 1 each, Rfl: 2 .  latanoprost (XALATAN) 0.005 % ophthalmic solution, Place 1 drop into the left eye at bedtime. , Disp: , Rfl:  .  loratadine (CLARITIN) 10 MG tablet, Take 1 tablet (10 mg total) by mouth 2 (two) times daily. (Patient taking differently: Take 10 mg by mouth daily. ), Disp: 60 tablet, Rfl: 0 .  montelukast (SINGULAIR) 10 MG tablet, Take 1 tablet (10 mg  total) by mouth at bedtime., Disp: 90 tablet, Rfl: 1 .  Multiple Vitamins-Minerals (MULTIVITAMIN WITH MINERALS) tablet, Take 1 tablet by mouth daily., Disp: , Rfl:  .  Naltrexone-buPROPion HCl ER (CONTRAVE) 8-90 MG TB12, Take 2 tablets by mouth 2 (two) times daily., Disp: 120 tablet, Rfl: 2 .  Omega-3 Fatty Acids (FISH OIL) 1200 MG CAPS, Take 2 capsules by mouth daily., Disp: , Rfl:  .  pyridostigmine (MESTINON) 60 MG tablet, Take 1 tablet at 6:30a, 11a, and 3:30p.  OK to take extra dose as needed., Disp: 360 tablet, Rfl: 3 .  rosuvastatin (CRESTOR) 5 MG tablet, Take 1 tablet (5 mg total) by mouth daily., Disp: 90 tablet, Rfl: 1 .  telmisartan-hydrochlorothiazide (MICARDIS HCT) 80-25 MG tablet, Take 1 tablet by mouth daily., Disp: 90 tablet, Rfl: 3 .  VENTOLIN HFA 108 (90 Base) MCG/ACT inhaler, INHALE 2 PUFFS BY MOUTH EVERY 4 HOURS AS NEEDED SHORTNESS OF BREATH/WHEEZING, Disp: 18 g, Rfl: 0  Allergies  Allergen Reactions  . Aspirin Swelling    Upset stomach  Upset stomach   . Azithromycin     Contraindicated in Myasthenia   . Ace Inhibitors     cough  . Amlodipine     Edema of feet and hands  . Gabapentin     anxiety  . Pregabalin Other (See Comments)    anxiety    ROS  Constitutional: Negative for fever or weight change.  Respiratory: Negative for cough and shortness of breath.   Cardiovascular: Negative for chest pain or palpitations.  Gastrointestinal: Negative for abdominal pain, no bowel changes.  Musculoskeletal: Negative for gait problem , she has intermittent right knee joint swelling.  Skin: Negative for rash.  Neurological: Negative for dizziness or headache.  No other specific complaints in a complete review of systems (except as listed in HPI above).  Objective  Vitals:   08/13/19 1433  BP: 126/84  Pulse: 71  Resp: 16  Temp: (!) 97.5 F (36.4 C)  TempSrc: Temporal  SpO2: 97%  Weight: 232 lb 11.2 oz (105.6 kg)  Height: 5' 2.25" (1.581 m)    Body mass  index is 42.22 kg/m.    Physical Exam  Constitutional: Patient appears well-developed and well-nourished. Obese  No distress.  HEENT: head atraumatic, normocephalic, pupils equal and reactive to light, ears normal TM bilaterally, neck supple, throat within normal limits, tonsil 3 plus but no redness or exudate  Cardiovascular: Normal rate, regular rhythm and normal heart sounds.  No murmur heard. No BLE edema. Pulmonary/Chest: Effort normal and breath sounds normal. No respiratory distress. Abdominal: Soft.  There is no tenderness. Negative CVA Psychiatric: Patient has a normal mood and affect. behavior is normal. Judgment and thought content normal.  Recent Results (from the past 2160 hour(s))  Basic metabolic panel     Status: None   Collection Time: 06/04/19  9:30 AM  Result Value  Ref Range   Sodium 138 135 - 145 mmol/L   Potassium 3.9 3.5 - 5.1 mmol/L   Chloride 103 98 - 111 mmol/L   CO2 26 22 - 32 mmol/L   Glucose, Bld 84 70 - 99 mg/dL   BUN 16 8 - 23 mg/dL   Creatinine, Ser 0.64 0.44 - 1.00 mg/dL   Calcium 9.3 8.9 - 10.3 mg/dL   GFR calc non Af Amer >60 >60 mL/min   GFR calc Af Amer >60 >60 mL/min   Anion gap 9 5 - 15    Comment: Performed at [facility] [address], [place], [place] [address]  Novel Coronavirus, NAA (Hosp order, Send-out to Ref Lab; TAT 18-24 hrs     Status: None   Collection Time: 06/04/19  9:42 AM   Specimen: Nasopharyngeal Swab; Respiratory  Result Value Ref Range   SARS-CoV-2, NAA NOT DETECTED NOT DETECTED    Comment: (NOTE) This nucleic acid amplification test was developed and its performance characteristics determined by [employer]. Nucleic acid amplification tests include PCR and TMA. This test has not been FDA cleared or approved. This test has been authorized by FDA under an Emergency Use Authorization (EUA). This test is only authorized for the duration of time the declaration that circumstances exist justifying the  authorization of the emergency use of in vitro diagnostic tests for detection of SARS-CoV-2 virus and/or diagnosis of COVID-19 infection under section 564(b)(1) of the Act, 21 U.S.C. [id number]) (1), unless the authorization is terminated or revoked sooner. When diagnostic testing is negative, the possibility of a false negative result should be considered in the context of a patient's recent exposures and the presence of clinical signs and symptoms consistent with COVID-19. An individual without symptoms of COVID- 19 and who is not shedding SARS-CoV-2 vi rus would expect to have a negative (not detected) result in this assay. Performed At: [facility] [place], [place] [id number] Rush Farmer MD [id number]    Coronavirus Source NASOPHARYNGEAL     Comment: Performed at [facility], [place] [address] [place], [place] [address]  Glucose, capillary     Status: None   Collection Time: 06/08/19  7:23 AM  Result Value Ref Range   Glucose-Capillary 79 70 - 99 mg/dL  Glucose, capillary     Status: None   Collection Time: 06/08/19  8:02 AM  Result Value Ref Range   Glucose-Capillary 88 70 - 99 mg/dL  CBC with Differential/Platelet     Status: Abnormal   Collection Time: 08/04/19  3:54 PM  Result Value Ref Range   WBC 11.2 (H) 3.8 - 10.8 Thousand/uL   RBC 4.27 3.80 - 5.10 Million/uL   Hemoglobin 12.8 11.7 - 15.5 g/dL   HCT 37.2 35.0 - 45.0 %   MCV 87.1 80.0 - 100.0 fL   MCH 30.0 27.0 - 33.0 pg   MCHC 34.4 32.0 - 36.0 g/dL   RDW 12.5 11.0 - 15.0 %   Platelets 301 140 - 400 Thousand/uL   MPV 10.1 7.5 - 12.5 fL   Neutro Abs 5,981 1,500 - 7,800 cells/uL   Lymphs Abs 4,155 (H) 850 - 3,900 cells/uL   Absolute Monocytes 840 200 - 950 cells/uL   Eosinophils Absolute 179 15 - 500 cells/uL   Basophils Absolute 45 0 - 200 cells/uL   Neutrophils Relative % 53.4 %   Total Lymphocyte 37.1 %   Monocytes Relative 7.5 %   Eosinophils Relative 1.6 %  Basophils  Relative 0.4 %  COMPLETE METABOLIC PANEL WITH GFR     Status: None   Collection Time: 08/04/19  3:54 PM  Result Value Ref Range   Glucose, Bld 81 65 - 99 mg/dL    Comment: .            Fasting reference interval .    BUN 17 7 - 25 mg/dL   Creat 0.57 0.50 - 0.99 mg/dL    Comment: For patients >54 years of age, the reference limit for Creatinine is approximately 13% higher for people identified as African-American. .    GFR, Est Non African American 98 > OR = 60 mL/min/1.18m2   GFR, Est African American 114 > OR = 60 mL/min/1.102m2   BUN/Creatinine Ratio NOT APPLICABLE 6 - 22 (calc)   Sodium 138 135 - 146 mmol/L   Potassium 4.0 3.5 - 5.3 mmol/L   Chloride 103 98 - 110 mmol/L   CO2 27 20 - 32 mmol/L   Calcium 9.3 8.6 - 10.4 mg/dL   Total Protein 6.9 6.1 - 8.1 g/dL   Albumin 4.2 3.6 - 5.1 g/dL   Globulin 2.7 1.9 - 3.7 g/dL (calc)   AG Ratio 1.6 1.0 - 2.5 (calc)   Total Bilirubin 0.4 0.2 - 1.2 mg/dL   Alkaline phosphatase (APISO) 40 37 - 153 U/L   AST 22 10 - 35 U/L   ALT 25 6 - 29 U/L  Hemoglobin A1c     Status: None   Collection Time: 08/04/19  3:54 PM  Result Value Ref Range   Hgb A1c MFr Bld 5.6 <5.7 % of total Hgb    Comment: For the purpose of screening for the presence of diabetes: . <5.7%       Consistent with the absence of diabetes 5.7-6.4%    Consistent with increased risk for diabetes             (prediabetes) > or =6.5%  Consistent with diabetes . This assay result is consistent with a decreased risk of diabetes. . Currently, no consensus exists regarding use of hemoglobin A1c for diagnosis of diabetes in children. . According to American Diabetes Association (ADA) guidelines, hemoglobin A1c <7.0% represents optimal control in non-pregnant diabetic patients. Different metrics may apply to specific patient populations.  Standards of Medical Care in Diabetes(ADA). .    Mean Plasma Glucose 114 (calc)   eAG (mmol/L) 6.3 (calc)  Thyroid Panel With TSH      Status: None   Collection Time: 08/04/19  3:54 PM  Result Value Ref Range   T3 Uptake 27 22 - 35 %   T4, Total 7.8 5.1 - 11.9 mcg/dL   Free Thyroxine Index 2.1 1.4 - 3.8   TSH 1.00 0.40 - 4.50 mIU/L  Lipid panel     Status: Abnormal   Collection Time: 08/04/19  3:54 PM  Result Value Ref Range   Cholesterol 237 (H) <200 mg/dL   HDL 72 > OR = 50 mg/dL   Triglycerides 134 <150 mg/dL   LDL Cholesterol (Calc) 139 (H) mg/dL (calc)    Comment: Reference range: <100 . Desirable range <100 mg/dL for primary prevention;   <70 mg/dL for patients with CHD or diabetic patients  with > or = 2 CHD risk factors. Marland Kitchen LDL-C is now calculated using the Martin-Hopkins  calculation, which is a validated novel method providing  better accuracy than the Friedewald equation in the  estimation of LDL-C.  Cresenciano Genre et al. Annamaria Helling. [id number]):  2061-2068  (http://education.[facility].com/faq/FAQ164)    Total CHOL/HDL Ratio 3.3 <5.0 (calc)   Non-HDL Cholesterol (Calc) 165 (H) <130 mg/dL (calc)    Comment: For patients with diabetes plus 1 major ASCVD risk  factor, treating to a non-HDL-C goal of <100 mg/dL  (LDL-C of <70 mg/dL) is considered a therapeutic  option.   VITAMIN D 25 Hydroxy (Vit-D Deficiency, Fractures)     Status: Abnormal   Collection Time: 08/04/19  3:54 PM  Result Value Ref Range   Vit D, 25-Hydroxy 15 (L) 30 - 100 ng/mL    Comment: Vitamin D Status         25-OH Vitamin D: . Deficiency:                    <20 ng/mL Insufficiency:             20 - 29 ng/mL Optimal:                 > or = 30 ng/mL . For 25-OH Vitamin D testing on patients on  D2-supplementation and patients for whom quantitation  of D2 and D3 fractions is required, the QuestAssureD(TM) 25-OH VIT D, (D2,D3), LC/MS/MS is recommended: order  code [id number] (patients >37yrs). See Note 1 . Note 1 . For additional information, please refer to  http://education.[facility].com/faq/FAQ199  (This link is being  provided for informational/ educational purposes only.)   Vitamin B12     Status: None   Collection Time: 08/04/19  3:54 PM  Result Value Ref Range   Vitamin B-12 445 200 - 1,100 pg/mL  CBC     Status: Abnormal   Collection Time: 08/11/19  4:35 PM  Result Value Ref Range   WBC 12.4 (H) 3.8 - 10.8 Thousand/uL   RBC 4.55 3.80 - 5.10 Million/uL   Hemoglobin 13.5 11.7 - 15.5 g/dL   HCT 40.0 35.0 - 45.0 %   MCV 87.9 80.0 - 100.0 fL   MCH 29.7 27.0 - 33.0 pg   MCHC 33.8 32.0 - 36.0 g/dL   RDW 12.5 11.0 - 15.0 %   Platelets 345 140 - 400 Thousand/uL   MPV 10.1 7.5 - 12.5 fL     Assessment & Plan   1. Leukocytosis, unspecified type  - CBC with Differential/Platelet - CULTURE, URINE COMPREHENSIVE  She will have carpal release right hand on 02/25, we will hold off on giving antibiotics , but she will get IV antibiotics during procedure  2. Dysfunction of both eustachian tubes  Resume nasal spray  3. History of recurrent UTIs   4. History of bladder disorder  History of bladder polyps and frequent UTI, we will recheck urine culture to see if it is the cause of leucocytosis consider follow up with Urologist also

## 2019-08-15 LAB — CULTURE, URINE COMPREHENSIVE
MICRO NUMBER:: 10170765
RESULT:: NO GROWTH
SPECIMEN QUALITY:: ADEQUATE

## 2019-08-16 ENCOUNTER — Encounter (HOSPITAL_BASED_OUTPATIENT_CLINIC_OR_DEPARTMENT_OTHER)
Admission: RE | Admit: 2019-08-16 | Discharge: 2019-08-16 | Disposition: A | Discharge status: Home / Self Care | Payer: 59 | Source: Ambulatory Visit | Attending: Orthopaedic Surgery | Admitting: Orthopaedic Surgery

## 2019-08-16 ENCOUNTER — Other Ambulatory Visit: Payer: Self-pay

## 2019-08-16 ENCOUNTER — Other Ambulatory Visit (HOSPITAL_COMMUNITY)
Admission: RE | Admit: 2019-08-16 | Discharge: 2019-08-16 | Disposition: A | Discharge status: Home / Self Care | Payer: 59 | Source: Ambulatory Visit | Attending: Orthopaedic Surgery | Admitting: Orthopaedic Surgery

## 2019-08-16 DIAGNOSIS — Z01812 Encounter for preprocedural laboratory examination: Secondary | ICD-10-CM | POA: Insufficient documentation

## 2019-08-16 DIAGNOSIS — Z20822 Contact with and (suspected) exposure to covid-19: Secondary | ICD-10-CM | POA: Diagnosis not present

## 2019-08-16 DIAGNOSIS — D72829 Elevated white blood cell count, unspecified: Secondary | ICD-10-CM | POA: Diagnosis not present

## 2019-08-16 LAB — CBC WITH DIFFERENTIAL/PLATELET
Absolute Monocytes: 675 cells/uL (ref 200–950)
Basophils Absolute: 38 cells/uL (ref 0–200)
Basophils Relative: 0.5 %
Eosinophils Absolute: 120 cells/uL (ref 15–500)
Eosinophils Relative: 1.6 %
HCT: 40.8 % (ref 35.0–45.0)
Hemoglobin: 13.9 g/dL (ref 11.7–15.5)
Lymphs Abs: 2828 cells/uL (ref 850–3900)
MCH: 29.6 pg (ref 27.0–33.0)
MCHC: 34.1 g/dL (ref 32.0–36.0)
MCV: 86.8 fL (ref 80.0–100.0)
MPV: 10 fL (ref 7.5–12.5)
Monocytes Relative: 9 %
Neutro Abs: 3840 cells/uL (ref 1500–7800)
Neutrophils Relative %: 51.2 %
Platelets: 315 10*3/uL (ref 140–400)
RBC: 4.7 10*6/uL (ref 3.80–5.10)
RDW: 12.4 % (ref 11.0–15.0)
Total Lymphocyte: 37.7 %
WBC: 7.5 10*3/uL (ref 3.8–10.8)

## 2019-08-16 LAB — BASIC METABOLIC PANEL
Anion gap: 12 (ref 5–15)
BUN: 16 mg/dL (ref 8–23)
CO2: 26 mmol/L (ref 22–32)
Calcium: 9.4 mg/dL (ref 8.9–10.3)
Chloride: 102 mmol/L (ref 98–111)
Creatinine, Ser: 0.57 mg/dL (ref 0.44–1.00)
GFR calc Af Amer: >60 mL/min (ref >60)
GFR calc non Af Amer: >60 mL/min (ref >60)
Glucose, Bld: 79 mg/dL (ref 70–99)
Potassium: 4.6 mmol/L (ref 3.5–5.1)
Sodium: 140 mmol/L (ref 135–145)

## 2019-08-16 LAB — SARS CORONAVIRUS 2 (TAT 6-24 HRS): SARS Coronavirus 2: NEGATIVE

## 2019-08-16 NOTE — Progress Notes (Signed)

## 2019-08-19 ENCOUNTER — Ambulatory Visit (HOSPITAL_BASED_OUTPATIENT_CLINIC_OR_DEPARTMENT_OTHER): Payer: 59 | Admitting: Anesthesiology

## 2019-08-19 ENCOUNTER — Other Ambulatory Visit: Payer: Self-pay

## 2019-08-19 ENCOUNTER — Telehealth: Payer: 59 | Admitting: Neurology

## 2019-08-19 ENCOUNTER — Ambulatory Visit (HOSPITAL_BASED_OUTPATIENT_CLINIC_OR_DEPARTMENT_OTHER)
Admission: RE | Admit: 2019-08-19 | Discharge: 2019-08-19 | Disposition: A | Discharge status: Home / Self Care | Payer: 59 | Attending: Orthopaedic Surgery | Admitting: Orthopaedic Surgery

## 2019-08-19 ENCOUNTER — Encounter (HOSPITAL_BASED_OUTPATIENT_CLINIC_OR_DEPARTMENT_OTHER): Payer: Self-pay | Admitting: Orthopaedic Surgery

## 2019-08-19 ENCOUNTER — Encounter (HOSPITAL_BASED_OUTPATIENT_CLINIC_OR_DEPARTMENT_OTHER)
Admission: RE | Disposition: A | Discharge status: Home / Self Care | Payer: Self-pay | Source: Home / Self Care | Attending: Orthopaedic Surgery

## 2019-08-19 DIAGNOSIS — G5602 Carpal tunnel syndrome, left upper limb: Secondary | ICD-10-CM

## 2019-08-19 DIAGNOSIS — Z881 Allergy status to other antibiotic agents status: Secondary | ICD-10-CM | POA: Insufficient documentation

## 2019-08-19 DIAGNOSIS — G5601 Carpal tunnel syndrome, right upper limb: Secondary | ICD-10-CM | POA: Diagnosis not present

## 2019-08-19 DIAGNOSIS — E669 Obesity, unspecified: Secondary | ICD-10-CM | POA: Diagnosis not present

## 2019-08-19 DIAGNOSIS — Z6841 Body Mass Index (BMI) 40.0 and over, adult: Secondary | ICD-10-CM | POA: Insufficient documentation

## 2019-08-19 DIAGNOSIS — Z7951 Long term (current) use of inhaled steroids: Secondary | ICD-10-CM | POA: Diagnosis not present

## 2019-08-19 DIAGNOSIS — F329 Major depressive disorder, single episode, unspecified: Secondary | ICD-10-CM | POA: Diagnosis not present

## 2019-08-19 DIAGNOSIS — Z9884 Bariatric surgery status: Secondary | ICD-10-CM | POA: Insufficient documentation

## 2019-08-19 DIAGNOSIS — G7000 Myasthenia gravis without (acute) exacerbation: Secondary | ICD-10-CM | POA: Diagnosis not present

## 2019-08-19 DIAGNOSIS — E1140 Type 2 diabetes mellitus with diabetic neuropathy, unspecified: Secondary | ICD-10-CM | POA: Diagnosis not present

## 2019-08-19 DIAGNOSIS — Z888 Allergy status to other drugs, medicaments and biological substances status: Secondary | ICD-10-CM | POA: Diagnosis not present

## 2019-08-19 DIAGNOSIS — Z886 Allergy status to analgesic agent status: Secondary | ICD-10-CM | POA: Insufficient documentation

## 2019-08-19 DIAGNOSIS — I10 Essential (primary) hypertension: Secondary | ICD-10-CM | POA: Insufficient documentation

## 2019-08-19 DIAGNOSIS — E785 Hyperlipidemia, unspecified: Secondary | ICD-10-CM | POA: Diagnosis not present

## 2019-08-19 DIAGNOSIS — Z79899 Other long term (current) drug therapy: Secondary | ICD-10-CM | POA: Diagnosis not present

## 2019-08-19 DIAGNOSIS — F419 Anxiety disorder, unspecified: Secondary | ICD-10-CM | POA: Insufficient documentation

## 2019-08-19 DIAGNOSIS — Z87891 Personal history of nicotine dependence: Secondary | ICD-10-CM | POA: Insufficient documentation

## 2019-08-19 DIAGNOSIS — H409 Unspecified glaucoma: Secondary | ICD-10-CM | POA: Insufficient documentation

## 2019-08-19 DIAGNOSIS — J45909 Unspecified asthma, uncomplicated: Secondary | ICD-10-CM | POA: Diagnosis not present

## 2019-08-19 DIAGNOSIS — J4520 Mild intermittent asthma, uncomplicated: Secondary | ICD-10-CM | POA: Diagnosis not present

## 2019-08-19 DIAGNOSIS — G4733 Obstructive sleep apnea (adult) (pediatric): Secondary | ICD-10-CM | POA: Insufficient documentation

## 2019-08-19 DIAGNOSIS — G5603 Carpal tunnel syndrome, bilateral upper limbs: Secondary | ICD-10-CM | POA: Diagnosis not present

## 2019-08-19 DIAGNOSIS — F418 Other specified anxiety disorders: Secondary | ICD-10-CM | POA: Diagnosis not present

## 2019-08-19 HISTORY — PX: CARPAL TUNNEL RELEASE: SHX101

## 2019-08-19 LAB — GLUCOSE, CAPILLARY
Glucose-Capillary: 95 mg/dL (ref 70–99)
Glucose-Capillary: 84 mg/dL (ref 70–99)

## 2019-08-19 SURGERY — CARPAL TUNNEL RELEASE
Anesthesia: Monitor Anesthesia Care | Site: Hand | Laterality: Right

## 2019-08-19 MED ORDER — LIDOCAINE 2% (20 MG/ML) 5 ML SYRINGE
INTRAMUSCULAR | Status: AC
  Filled 2019-08-19: qty 5

## 2019-08-19 MED ORDER — MIDAZOLAM HCL 2 MG/2ML IJ SOLN
INTRAMUSCULAR | Status: AC
  Filled 2019-08-19: qty 2

## 2019-08-19 MED ORDER — ACETAMINOPHEN 10 MG/ML IV SOLN: 1000.0000 mg | Freq: Once | INTRAVENOUS | Status: DC | PRN

## 2019-08-19 MED ORDER — FENTANYL CITRATE (PF) 100 MCG/2ML IJ SOLN
INTRAMUSCULAR | Status: DC | PRN
  Administered 2019-08-19 (×2): 50 ug via INTRAVENOUS

## 2019-08-19 MED ORDER — FENTANYL CITRATE (PF) 100 MCG/2ML IJ SOLN: 50.0000 ug | INTRAMUSCULAR | Status: DC | PRN

## 2019-08-19 MED ORDER — ONDANSETRON HCL 4 MG/2ML IJ SOLN
INTRAMUSCULAR | Status: AC
  Filled 2019-08-19: qty 2

## 2019-08-19 MED ORDER — BUPIVACAINE HCL (PF) 0.25 % IJ SOLN
INTRAMUSCULAR | Status: DC | PRN
  Administered 2019-08-19: 3 mL

## 2019-08-19 MED ORDER — ACETAMINOPHEN 160 MG/5ML PO SOLN: 325.0000 mg | Freq: Once | ORAL | Status: DC | PRN

## 2019-08-19 MED ORDER — PROMETHAZINE HCL 25 MG/ML IJ SOLN: 6.2500 mg | INTRAMUSCULAR | Status: DC | PRN

## 2019-08-19 MED ORDER — MEPERIDINE HCL 25 MG/ML IJ SOLN: 6.2500 mg | INTRAMUSCULAR | Status: DC | PRN

## 2019-08-19 MED ORDER — MIDAZOLAM HCL 5 MG/5ML IJ SOLN
INTRAMUSCULAR | Status: DC | PRN
  Administered 2019-08-19: 2 mg via INTRAVENOUS

## 2019-08-19 MED ORDER — LIDOCAINE HCL (PF) 1 % IJ SOLN
INTRAMUSCULAR | Status: DC | PRN
  Administered 2019-08-19: 7 mL

## 2019-08-19 MED ORDER — HYDROCODONE-ACETAMINOPHEN 5-325 MG PO TABS: 1.0000 | ORAL_TABLET | Freq: Four times a day (QID) | ORAL | 0 refills | Status: DC | PRN

## 2019-08-19 MED ORDER — CEFAZOLIN SODIUM-DEXTROSE 2-4 GM/100ML-% IV SOLN
INTRAVENOUS | Status: AC
  Filled 2019-08-19: qty 100

## 2019-08-19 MED ORDER — PROPOFOL 500 MG/50ML IV EMUL
INTRAVENOUS | Status: DC | PRN
  Administered 2019-08-19: 25 ug/kg/min via INTRAVENOUS

## 2019-08-19 MED ORDER — OXYCODONE HCL 5 MG/5ML PO SOLN: 5.0000 mg | Freq: Once | ORAL | Status: DC | PRN

## 2019-08-19 MED ORDER — CHLORHEXIDINE GLUCONATE 4 % EX LIQD: 60.0000 mL | Freq: Once | CUTANEOUS | Status: DC

## 2019-08-19 MED ORDER — CEFAZOLIN SODIUM-DEXTROSE 2-4 GM/100ML-% IV SOLN
2.0000 g | INTRAVENOUS | Status: AC
  Administered 2019-08-19: 2 g via INTRAVENOUS

## 2019-08-19 MED ORDER — ACETAMINOPHEN 325 MG PO TABS: 325.0000 mg | ORAL_TABLET | Freq: Once | ORAL | Status: DC | PRN

## 2019-08-19 MED ORDER — LACTATED RINGERS IV SOLN: INTRAVENOUS | Status: DC

## 2019-08-19 MED ORDER — OXYCODONE HCL 5 MG PO TABS: 5.0000 mg | ORAL_TABLET | Freq: Once | ORAL | Status: DC | PRN

## 2019-08-19 MED ORDER — FENTANYL CITRATE (PF) 100 MCG/2ML IJ SOLN
INTRAMUSCULAR | Status: AC
  Filled 2019-08-19: qty 2

## 2019-08-19 MED ORDER — LIDOCAINE 2% (20 MG/ML) 5 ML SYRINGE
INTRAMUSCULAR | Status: DC | PRN
  Administered 2019-08-19: 60 mg via INTRAVENOUS

## 2019-08-19 MED ORDER — MIDAZOLAM HCL 2 MG/2ML IJ SOLN: 1.0000 mg | INTRAMUSCULAR | Status: DC | PRN

## 2019-08-19 MED ORDER — FENTANYL CITRATE (PF) 100 MCG/2ML IJ SOLN: 25.0000 ug | INTRAMUSCULAR | Status: DC | PRN

## 2019-08-19 SURGICAL SUPPLY — 40 items
BLADE SURG 15 STRL LF DISP TIS (BLADE) ×2 IMPLANT
BLADE SURG 15 STRL SS (BLADE) ×4
BNDG ELASTIC 3X5.8 VLCR STR LF (GAUZE/BANDAGES/DRESSINGS) ×3 IMPLANT
BNDG ELASTIC 4X5.8 VLCR STR LF (GAUZE/BANDAGES/DRESSINGS) ×3 IMPLANT
BNDG ESMARK 4X9 LF (GAUZE/BANDAGES/DRESSINGS) ×3 IMPLANT
CORD BIPOLAR FORCEPS 12FT (ELECTRODE) ×3 IMPLANT
COVER BACK TABLE 60X90IN (DRAPES) ×3 IMPLANT
COVER MAYO STAND STRL (DRAPES) ×3 IMPLANT
COVER WAND RF STERILE (DRAPES) IMPLANT
CUFF TOURN SGL QUICK 18X4 (TOURNIQUET CUFF) ×3 IMPLANT
CUFF TOURN SGL QUICK 24 (TOURNIQUET CUFF)
CUFF TRNQT CYL 24X4X16.5-23 (TOURNIQUET CUFF) IMPLANT
DRAPE EXTREMITY T 121X128X90 (DISPOSABLE) ×3 IMPLANT
DRAPE SURG 17X23 STRL (DRAPES) ×3 IMPLANT
DURAPREP 26ML APPLICATOR (WOUND CARE) ×3 IMPLANT
GAUZE SPONGE 4X4 12PLY STRL (GAUZE/BANDAGES/DRESSINGS) ×3 IMPLANT
GAUZE XEROFORM 1X8 LF (GAUZE/BANDAGES/DRESSINGS) ×3 IMPLANT
GLOVE BIO SURGEON STRL SZ7.5 (GLOVE) ×6 IMPLANT
GLOVE BIOGEL PI IND STRL 8 (GLOVE) ×2 IMPLANT
GLOVE BIOGEL PI INDICATOR 8 (GLOVE) ×4
GOWN STRL REUS W/ TWL LRG LVL3 (GOWN DISPOSABLE) ×1 IMPLANT
GOWN STRL REUS W/TWL LRG LVL3 (GOWN DISPOSABLE) ×2
GOWN STRL REUS W/TWL XL LVL3 (GOWN DISPOSABLE) ×6 IMPLANT
LOOP VESSEL MAXI BLUE (MISCELLANEOUS) IMPLANT
NEEDLE HYPO 25X1 1.5 SAFETY (NEEDLE) IMPLANT
NS IRRIG 1000ML POUR BTL (IV SOLUTION) ×3 IMPLANT
PACK BASIN DAY SURGERY FS (CUSTOM PROCEDURE TRAY) ×3 IMPLANT
PAD CAST 3X4 CTTN HI CHSV (CAST SUPPLIES) ×1 IMPLANT
PAD CAST 4YDX4 CTTN HI CHSV (CAST SUPPLIES) ×1 IMPLANT
PADDING CAST ABS 4INX4YD NS (CAST SUPPLIES) ×2
PADDING CAST ABS COTTON 4X4 ST (CAST SUPPLIES) ×1 IMPLANT
PADDING CAST COTTON 3X4 STRL (CAST SUPPLIES) ×2
PADDING CAST COTTON 4X4 STRL (CAST SUPPLIES) ×2
SLEEVE SCD COMPRESS KNEE MED (MISCELLANEOUS) IMPLANT
STOCKINETTE 4X48 STRL (DRAPES) ×3 IMPLANT
SUT ETHILON 3 0 PS 1 (SUTURE) ×3 IMPLANT
SYR BULB 3OZ (MISCELLANEOUS) ×3 IMPLANT
SYR CONTROL 10ML LL (SYRINGE) ×3 IMPLANT
TOWEL GREEN STERILE FF (TOWEL DISPOSABLE) ×3 IMPLANT
UNDERPAD 30X36 HEAVY ABSORB (UNDERPADS AND DIAPERS) ×3 IMPLANT

## 2019-08-19 NOTE — H&P (Signed)
Jasmine Woods is an 64 y.o. female.   Chief Complaint: Right hand numbness, tingling, weakness HPI: The patient is a 64 year old female with known moderate to severe carpal tunnel syndrome.  This has been confirmed with EMG/nerve conduction studies.  She has had a previous left open carpal tunnel release.  She understands the recommendation for having a right open carpal tunnel release given her nerve conduction findings and clinical exam findings of numbness, tingling and weakness of the right hand.  Past Medical History:  Diagnosis Date  . Allergy   . Anxiety   . Asthma   . Bladder polyp    Dr. Ernst Spell  . Carpal tunnel syndrome   . Complication of anesthesia    needed nebulizer after surgery  . Depression   . Diabetes mellitus without complication ([place])    Diet controlled  . Double vision    seen by Dr. 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 Dyslipidemia   . Frequency of urination   . Glaucoma, left eye   . H/O myasthenia gravis    MOSTLY AFFECTS EYES  . HA (headache)   . Heart murmur    followed by PCP  . Hx of bariatric surgery   . Hypertension    no meds  . Internal hemorrhoids   . Myasthenia gravis ([place])    "mostly effects eyes"  . Myasthenia gravis ([place])   . Neuropathy, generalized    diabetic on feet  . Nuclear sclerotic cataract of both eyes   . Obesity (BMI 30-39.9)   . Obstructive sleep apnea on CPAP    uses cpap  . Patient is Jehovah's Witness    no blood products  . Rotator cuff tendonitis 07/2009   also has impingment right shoulder pain, seen by Dr. Mack Guise  . Sleep apnea   . Thyroid nodule   . Vaginal dryness, menopausal   . Vitamin D deficiency     Past Surgical History:  Procedure Laterality Date  . ABDOMINAL HYSTERECTOMY  1991  . [place] SURGERY  2014  . CARPAL TUNNEL RELEASE Left 06/08/2019   Procedure: LEFT CARPAL TUNNEL RELEASE;  Surgeon: Mcarthur Rossetti, MD;  Location: [facility];  Service:  Orthopedics;  Laterality: Left;  . CATARACT EXTRACTION W/PHACO Left 01/28/2017   Procedure: CATARACT EXTRACTION PHACO AND INTRAOCULAR LENS PLACEMENT (IOC);  Surgeon: Birder Robson, MD;  Location: [facility];  Service: Ophthalmology;  Laterality: Left;  [place] 00:37 AP% 17.8 CDE 6.61 Fluid pack lot # [id number]  . COLONOSCOPY  2012  . COLONOSCOPY WITH PROPOFOL N/A 09/30/2016   Procedure: COLONOSCOPY WITH PROPOFOL;  Surgeon: Lucilla Lame, MD;  Location: [facility];  Service: Endoscopy;  Laterality: N/A;  diabetic - diet controlled sleep apnea  . KNEE SURGERY Right 1979-1980  . OOPHORECTOMY    . PARS PLANA VITRECTOMY Left 03/27/2016   Procedure: PARS PLANA VITRECTOMY WITH 25 GAUGE AND MEMBRANE PEEL, INTERNAL LIMITING MEMBRANE;  Surgeon: Hurman Horn, MD;  Location: [facility];  Service: Ophthalmology;  Laterality: Left;  . THYROID SURGERY     BX done was benign  . UPPER GI ENDOSCOPY    . VITRECTOMY Left 04/27/2016    Family History  Problem Relation Age of Onset  . Diabetes Mother   . Hypertension Mother   . Heart disease Mother 92       stent x 1   . Migraines Sister   . Stroke Maternal Grandfather   . Diabetes Paternal Grandfather   . Muscular dystrophy  Sister   . Heart attack Maternal Aunt   . Breast cancer Neg Hx   . Thyroid disease Neg Hx    Social History:  reports that she quit smoking about 44 years ago. She started smoking about 47 years ago. She has a 1.50 pack-year smoking history. She has never used smokeless tobacco. She reports that she does not drink alcohol or use drugs.  Allergies:  Allergies  Allergen Reactions  . Aspirin Swelling    Upset stomach  Upset stomach   . Azithromycin     Contraindicated in Myasthenia   . Ace Inhibitors     cough  . Amlodipine     Edema of feet and hands  . Gabapentin     anxiety  . Pregabalin Other (See Comments)    anxiety    Medications Prior to Admission  Medication Sig Dispense Refill  . brimonidine-timolol (COMBIGAN)  0.2-0.5 % ophthalmic solution 1 drop daily.     . budesonide (RHINOCORT AQUA) 32 MCG/ACT nasal spray Place 2 sprays into both nostrils at bedtime. 15 mL 2  . Coenzyme Q10 (CO Q 10) 100 MG CAPS Take 1 capsule by mouth daily. 30 capsule 2  . Dulaglutide (TRULICITY) 1.5 0000000 SOPN Inject 1.5 mg into the skin once a week. 4 pen 2  . DULoxetine (CYMBALTA) 60 MG capsule Take 1 capsule (60 mg total) by mouth daily. 90 capsule 1  . Fluticasone-Salmeterol (ADVAIR) 100-50 MCG/DOSE AEPB Inhale 1 puff into the lungs 2 (two) times daily. 1 each 2  . latanoprost (XALATAN) 0.005 % ophthalmic solution Place 1 drop into the left eye at bedtime.     Marland Kitchen loratadine (CLARITIN) 10 MG tablet Take 1 tablet (10 mg total) by mouth 2 (two) times daily. (Patient taking differently: Take 10 mg by mouth daily. ) 60 tablet 0  . montelukast (SINGULAIR) 10 MG tablet Take 1 tablet (10 mg total) by mouth at bedtime. 90 tablet 1  . Multiple Vitamins-Minerals (MULTIVITAMIN WITH MINERALS) tablet Take 1 tablet by mouth daily.    . Naltrexone-buPROPion HCl ER (CONTRAVE) 8-90 MG TB12 Take 2 tablets by mouth 2 (two) times daily. 120 tablet 2  . Omega-3 Fatty Acids (FISH OIL) 1200 MG CAPS Take 2 capsules by mouth daily.    Marland Kitchen pyridostigmine (MESTINON) 60 MG tablet Take 1 tablet at 6:30a, 11a, and 3:30p.  OK to take extra dose as needed. 360 tablet 3  . rosuvastatin (CRESTOR) 5 MG tablet Take 1 tablet (5 mg total) by mouth daily. 90 tablet 1  . telmisartan-hydrochlorothiazide (MICARDIS HCT) 80-25 MG tablet Take 1 tablet by mouth daily. 90 tablet 3  . ACCU-CHEK GUIDE test strip USE AS DIRECTED 100 each 12  . acetaminophen (TYLENOL) 500 MG tablet Take 1,000 mg by mouth every 6 (six) hours as needed for mild pain. Reported on 07/06/2015    . VENTOLIN HFA 108 (90 Base) MCG/ACT inhaler INHALE 2 PUFFS BY MOUTH EVERY 4 HOURS AS NEEDED SHORTNESS OF BREATH/WHEEZING 18 g 0    Results for orders placed or performed during the hospital encounter of  08/19/19 (from the past 48 hour(s))  Glucose, capillary     Status: None   Collection Time: 08/19/19  9:06 AM  Result Value Ref Range   Glucose-Capillary 95 70 - 99 mg/dL    Comment: Glucose reference range applies only to samples taken after fasting for at least 8 hours.   No results found.  Review of Systems  All other systems reviewed and are  negative.   Blood pressure (!) 142/63, pulse 60, temperature 98.3 F (36.8 C), temperature source Oral, resp. rate 20, height 5\' 2"  (1.575 m), weight 103.9 kg, SpO2 100 %. Physical Exam  Constitutional: She is oriented to person, place, and time. She appears well-developed and well-nourished.  HENT:  Head: Normocephalic and atraumatic.  Eyes: Pupils are equal, round, and reactive to light. EOM are normal.  Cardiovascular: Normal rate.  Respiratory: Effort normal.  GI: Soft.  Musculoskeletal:     Right hand: Tenderness present. Decreased strength. Decreased sensation of the median distribution.     Cervical back: Normal range of motion and neck supple.  Neurological: She is alert and oriented to person, place, and time.  Skin: Skin is warm and dry.  Psychiatric: She has a normal mood and affect.     Assessment/Plan Right carpal tunnel syndrome  Our plan is to proceed to surgery today for right open carpal tunnel release.  The risks benefits of surgery been explained in detail and informed consent is obtained.  The right hand has been marked.  Mcarthur Rossetti, MD 08/19/2019, 10:36 AM

## 2019-08-19 NOTE — Op Note (Signed)
NAME: Jasmine Woods, Jasmine Woods [facility] MEDICAL RECORD [id number] ACCOUNT [id number] DATE OF BIRTH:05-08-1956 FACILITY: [facility] LOCATION: [facility] PHYSICIAN:Demarlo Riojas Kerry Fort, MD  OPERATIVE REPORT  DATE OF PROCEDURE:  08/19/2019  PREOPERATIVE DIAGNOSIS:  Right carpal tunnel syndrome.  POSTOPERATIVE DIAGNOSIS:  Right carpal tunnel syndrome.  PROCEDURE:  Right open carpal tunnel release.  SURGEON:  Lind Guest.  Ninfa Linden, MD  ANESTHESIA: 1.  Mask ventilation, IV sedation. 2.  A combination of local with plain Marcaine and plain lidocaine.  TOURNIQUET TIME:  8 minutes.  ANTIBIOTICS:  Two g IV Ancef.  ESTIMATED BLOOD LOSS:  Minimal.  COMPLICATIONS:  None.  INDICATIONS:  The patient is a very pleasant 64 year old female with bilateral carpal tunnel syndrome.  We have already performed a left open carpal tunnel release successfully.  Her nerve conduction studies also show moderate to severe carpal tunnel  syndrome on the right side.  She is experiencing some numbness and tingling and weakness.  Having had a successful left open carpal tunnel release that she has healed from, she now wished to proceed with surgery on the right side.  Having had this  before, she is fully aware of the risks and benefits of surgery and does wish to proceed.  DESCRIPTION OF PROCEDURE:  After informed consent was obtained, the appropriate right hand and wrist were marked.  She was brought to the operating room and placed supine on the operating table, the right arm on an arm table.  A forearm tourniquet was  then placed and the right forearm, wrist and hand were prepped and draped with DuraPrep and sterile drapes.  A time-out was called.  She was identified as correct patient, correct right hand.  Moderate sedation was then given and we were able to  anesthetize the hand with a mixture of plain lidocaine, followed by plain Marcaine.  Prior to injection,  we did use an Esmarch wrap of the hand and  tourniquet was inflated to 250 mm of pressure.  Once adequate anesthesia was obtained, I then made an  incision in the palm of the hand over the transverse carpal ligament and carried this proximally and distally.  I slowly and meticulously dissected down to the distal edge of the transverse carpal ligament and then divided it from distal to proximal, all  the while protecting the median nerve.  We were able to explore the median nerve and found it to be intact, as well as its motor branch.  We reapproximated the skin with interrupted 3-0 nylon suture.  Xeroform well-padded sterile dressing was applied.   The tourniquet was let down.  Her fingers pinkened nicely and she is able to move her fingers and thumb.  She was taken to the recovery room in stable condition.  All final counts were correct.  No complications noted.  Postoperatively, she will be  discharged home from the PACU with wound care instructions and followup instructions.  VN/NUANCE  D:08/19/2019 T:08/19/2019 JOB:[id number]

## 2019-08-19 NOTE — Transfer of Care (Signed)
Immediate Anesthesia Transfer of Care Note  Patient: Jasmine Woods  Procedure(s) Performed: RIGHT CARPAL TUNNEL RELEASE (Right Hand)  Patient Location: PACU  Anesthesia Type:MAC  Level of Consciousness: awake  Airway & Oxygen Therapy: Patient Spontanous Breathing and Patient connected to face mask oxygen  Post-op Assessment: Report given to RN and Post -op Vital signs reviewed and stable  Post vital signs: Reviewed and stable  Last Vitals:  Vitals Value Taken Time  BP 117/52 08/19/19 1110  Temp    Pulse 58 08/19/19 1114  Resp 15 08/19/19 1114  SpO2 98 % 08/19/19 1114  Vitals shown include unvalidated device data.  Last Pain:  Vitals:   08/19/19 0850  TempSrc: Oral  PainSc: 0-No pain         Complications: No apparent anesthesia complications

## 2019-08-19 NOTE — Anesthesia Postprocedure Evaluation (Signed)
Anesthesia Post Note  Patient: Jasmine Woods  Procedure(s) Performed: RIGHT CARPAL TUNNEL RELEASE (Right Hand)     Patient location during evaluation: PACU Anesthesia Type: MAC Level of consciousness: awake and alert Pain management: pain level controlled Vital Signs Assessment: post-procedure vital signs reviewed and stable Respiratory status: spontaneous breathing, nonlabored ventilation, respiratory function stable and patient connected to nasal cannula oxygen Cardiovascular status: stable and blood pressure returned to baseline Postop Assessment: no apparent nausea or vomiting Anesthetic complications: no    Last Vitals:  Vitals:   08/19/19 1130 08/19/19 1145  BP: 113/60 (!) 121/47  Pulse: (!) 53 65  Resp: 14 16  Temp:  36.9 C  SpO2: 100% 100%    Last Pain:  Vitals:   08/19/19 1145  TempSrc: Oral  PainSc: 0-No pain                 Effie Berkshire

## 2019-08-19 NOTE — Anesthesia Preprocedure Evaluation (Addendum)
Anesthesia Evaluation  Patient identified by MRN, date of birth, ID band Patient awake    Reviewed: Allergy & Precautions, NPO status , Patient's Chart, lab work & pertinent test results  Airway Mallampati: I  TM Distance: >3 FB Neck ROM: Full    Dental  (+) Teeth Intact, Dental Advisory Given   Pulmonary asthma , sleep apnea , former smoker,    breath sounds clear to auscultation       Cardiovascular hypertension, Pt. on medications  Rhythm:Regular Rate:Normal     Neuro/Psych  Headaches, PSYCHIATRIC DISORDERS Anxiety Depression    GI/Hepatic negative GI ROS, Neg liver ROS,   Endo/Other  diabetes  Renal/GU      Musculoskeletal   Abdominal Normal abdominal exam  (+)   Peds  Hematology   Anesthesia Other Findings   Reproductive/Obstetrics                            Lab Results  Component Value Date   WBC 7.5 08/16/2019   HGB 13.9 08/16/2019   HCT 40.8 08/16/2019   MCV 86.8 08/16/2019   PLT 315 08/16/2019   EKG: normal sinus rhythm.  Anesthesia Physical Anesthesia Plan  ASA: III  Anesthesia Plan: MAC   Post-op Pain Management:    Induction: Intravenous  PONV Risk Score and Plan: 3 and Ondansetron, Propofol infusion and Midazolam  Airway Management Planned: Simple Face Mask and Natural Airway  Additional Equipment: None  Intra-op Plan:   Post-operative Plan:   Informed Consent: I have reviewed the patients History and Physical, chart, labs and discussed the procedure including the risks, benefits and alternatives for the proposed anesthesia with the patient or authorized representative who has indicated his/her understanding and acceptance.       Plan Discussed with: CRNA  Anesthesia Plan Comments:        Anesthesia Quick Evaluation

## 2019-08-19 NOTE — Discharge Instructions (Signed)
Keep your dressing clean and dry for the next 6 days. Elevation and ice as needed for swelling. Use your right hand as comfort allows. Once you remove your dressings, place large band-aids over your incision daily after you shower.    Post Anesthesia Home Care Instructions  Activity: Get plenty of rest for the remainder of the day. A responsible individual must stay with you for 24 hours following the procedure.  For the next 24 hours, DO NOT: -Drive a car -Paediatric nurse -Drink alcoholic beverages -Take any medication unless instructed by your physician -Make any legal decisions or sign important papers.  Meals: Start with liquid foods such as gelatin or soup. Progress to regular foods as tolerated. Avoid greasy, spicy, heavy foods. If nausea and/or vomiting occur, drink only clear liquids until the nausea and/or vomiting subsides. Call your physician if vomiting continues.  Special Instructions/Symptoms: Your throat may feel dry or sore from the anesthesia or the breathing tube placed in your throat during surgery. If this causes discomfort, gargle with warm salt water. The discomfort should disappear within 24 hours.  If you had a scopolamine patch placed behind your ear for the management of post- operative nausea and/or vomiting:  1. The medication in the patch is effective for 72 hours, after which it should be removed.  Wrap patch in a tissue and discard in the trash. Wash hands thoroughly with soap and water. 2. You may remove the patch earlier than 72 hours if you experience unpleasant side effects which may include dry mouth, dizziness or visual disturbances. 3. Avoid touching the patch. Wash your hands with soap and water after contact with the patch.     Safe Surgery and Sleep Apnea Sleep apnea is a condition in which breathing pauses or becomes shallow during sleep. Most people with the condition are not aware that they have it. It is important for your health care  providers to know whether or not you have sleep apnea, especially if you are having surgery. Sleep apnea can increase your risk of complications during and after surgery. What is sleep apnea screening? Sleep apnea screening is a test to determine if you are at risk for sleep apnea. Before you have surgery, get screened for sleep apnea and talk with your surgeon and primary health care provider about your results. Screening usually involves answering a list of questions about your sleep quality. Ask your health care provider if you can be screened, or take a screening test yourself. You can find these tests online at the American Sleep Apnea Association website. Some questions you may be asked include:  Do you snore?  Is your sleep restless?  Do you have daytime sleepiness?  Has a partner or spouse told you that you stop breathing during sleep?  Have you had trouble concentrating or memory loss? Answer these questions honestly. If a screening test is positive, this means you are at risk for the condition. Further testing may be needed to confirm a diagnosis of sleep apnea. Why does sleep apnea increase the risk for complications? Untreated sleep apnea increases the risk for certain complications during and after surgery. This is because when you have sleep apnea, your airways are more sensitive to medicines used during surgery. The airways can collapse and block the flow of air.  Having untreated sleep apnea can increase your risk for:  A longer stay in the recovery room or hospital.  Breathing difficulties such as low oxygen levels after surgery.  Increased pain after  surgery.  Irregular heart rhythms.  Stroke.  Heart attack. You and your health care provider can take steps to help prevent these and other complications. What should I do if I have sleep apnea?  Before surgery  Tell your health care provider and anesthesia specialist that you have sleep apnea. Discuss your individual  risks based on your screening results, the type of surgery you will be having, and other medical conditions that you have.  If you have a sleep apnea device (positive airway pressure device), wear it as prescribed. If you have not been wearing your device, talk with your health care provider about why you have not been wearing it. There are ways to improve your use of the device, such as: ? Adjusting the mask. ? Adding humidified air. ? Getting treatment for nasal congestion.  Do not use any products that contain nicotine or tobacco, such as cigarettes and e-cigarettes. If you need help quitting, ask your health care provider. On the day of surgery  If instructed by your health care provider, bring your sleep apnea device with you.  Wear your sleep apnea device when you are sleeping during your hospital stay, or as told by your health care provider.  Ask your health care provider what special considerations will be taken during and after your surgery. After surgery  You may need to be given extra oxygen and wear a continuous oxygen monitor (pulse oximetry).  For your safety, you may need to stay in the recovery room or hospital for longer than is normal.  Follow instructions from your health care provider about wearing your sleep device: ? Anytime you are sleeping, including during daytime naps. ? While taking prescription pain medicines, sleeping medicines, or medicines that make you drowsy.  If your health care provider approves, raise the head of your bed or lie on your side. Do not lie flat on your back.  Follow instructions from your health care provider about medicines: ? Avoid using sleep medicines unless they are prescribed by a health care provider who is aware of the results of your sleep apnea screening. ? Avoid using sleep medicines while taking opioid pain medicine. ? Limit your use of opioid pain medicines as much as possible. Ask your health care provider what is a safe  amount to use. ? Ask about using pain medicines that do not affect your breathing, such as NSAIDs or acetaminophen. Where to find more information For more information about sleep apnea screening and healthy sleep, visit these websites:  Centers for Disease Control and Prevention: [employer]pl  American Sleep Apnea Association: www.sleepapnea.org Contact a health care provider if:  You have sleep apnea or think you may be at risk for sleep apnea, and you are scheduled for surgery. Get help right away if:  You have trouble breathing.  You are very drowsy and cannot stay awake.  You are told that you have pauses in your breathing during sleep after surgery.  You have chest pain.  You have a fast heartbeat. Summary  It is important for your health care providers to know whether or not you have sleep apnea, especially if you are having surgery.  If you have sleep apnea, you are at an increased risk for complications during surgery.  You and your health care provider can take precautions to help prevent complications. If you have sleep apnea, make sure to tell your health care provider and anesthesia specialist. This information is not intended to replace advice given to you by  your health care provider. Make sure you discuss any questions you have with your health care provider. Document Revised: 10/02/2018 Document Reviewed: 09/26/2016 Elsevier Patient Education  [place].

## 2019-08-19 NOTE — Brief Op Note (Signed)
08/19/2019  [id number] AM  PATIENT:  [name]  64 y.o. female  PRE-OPERATIVE DIAGNOSIS:  right carpal tunnel syndrome  POST-OPERATIVE DIAGNOSIS:  right carpal tunnel syndrome  PROCEDURE:  Procedure(s): RIGHT CARPAL TUNNEL RELEASE (Right)  SURGEON:  Surgeon(s) and Role:    Mcarthur Rossetti, MD - Primary  ANESTHESIA:   local and IV sedation  EBL:  2 mL   COUNTS:  YES  TOURNIQUET:   Total Tourniquet Time Documented: Forearm (Right) - 9 minutes Total: Forearm (Right) - 9 minutes   DICTATION: .Other Dictation: Dictation Number [id number]  PLAN OF CARE: Discharge to home after PACU  PATIENT DISPOSITION:  PACU - hemodynamically stable.   Delay start of Pharmacological VTE agent (>24hrs) due to surgical blood loss or risk of bleeding: no

## 2019-08-20 ENCOUNTER — Encounter: Payer: Self-pay | Admitting: *Deleted

## 2019-08-23 ENCOUNTER — Telehealth (INDEPENDENT_AMBULATORY_CARE_PROVIDER_SITE_OTHER): Payer: 59 | Admitting: Neurology

## 2019-08-23 ENCOUNTER — Other Ambulatory Visit: Payer: Self-pay

## 2019-08-23 ENCOUNTER — Other Ambulatory Visit: Payer: Self-pay | Admitting: Neurology

## 2019-08-23 DIAGNOSIS — G5603 Carpal tunnel syndrome, bilateral upper limbs: Secondary | ICD-10-CM

## 2019-08-23 DIAGNOSIS — G7000 Myasthenia gravis without (acute) exacerbation: Secondary | ICD-10-CM

## 2019-08-23 MED ORDER — PYRIDOSTIGMINE BROMIDE 60 MG PO TABS: ORAL_TABLET | ORAL | 3 refills | Status: DC

## 2019-08-23 NOTE — Progress Notes (Signed)
   Virtual Visit via Video Note The purpose of this virtual visit is to provide medical care while limiting exposure to the novel coronavirus.    Consent was obtained for video visit:  Yes.   Answered questions that patient had about telehealth interaction:  Yes.   I discussed the limitations, risks, security and privacy concerns of performing an evaluation and management service by telemedicine. I also discussed with the patient that there may be a patient responsible charge related to this service. The patient expressed understanding and agreed to proceed.  Pt location: Home Physician Location: office Name of referring provider:  Steele Sizer, MD I connected with Jasmine Woods at patients initiation/request on 08/23/2019 at  1:30 PM EST by video enabled telemedicine application and verified that I am speaking with the correct person using two identifiers. Pt MRN:  [id number] Pt DOB:  [birth year] Video Participants:  Jasmine Woods   History of Present Illness: This is a 64 y.o. female returning for follow-up of bilateral carpal tunnel syndrome and ocular myasthenia gravis.  Since her last visit, she was referred to orthopedics and saw Dr. Ninfa Linden who performed bilateral CTS release (left in December, right in February), and is doing well.  She feels that her left hand has markedly improved and it is too early to tell on her right hand.  She is doing well from myasthenia standpoint and denies any droopiness of the eyelids or double vision.  She is compliant with taking Mestinon 3 times daily.  She has no new neurological complaints.  Observations/Objective:   There were no vitals filed for this visit. Patient is awake, alert, and appears comfortable.  Oriented x 4.   Extraocular muscles are intact. Mild left ptosis.  Face is symmetric.  Speech is not dysarthric. Tongue is midline. Antigravity in all extremities.  No pronator drift.   Assessment and Plan:  1.   Bilateral CTS s/p bilateral CTS release   - Improved in the left hand, right hand is still recovering.  Appreciate Dr. Trevor Mace care.  2.  Seronegative ocular myasthenia gravis without exacerbation, diagnosed 2017 with single-fiber EMG.  Exam shows trace left ptosis, she is otherwise asymptomatic.  -Continue Mestinon 60 mg at 6:30a, 11a, and 3;30p.  Okay to take extra dose as needed   Follow Up Instructions:   I discussed the assessment and treatment plan with the patient. The patient was provided an opportunity to ask questions and all were answered. The patient agreed with the plan and demonstrated an understanding of the instructions.   The patient was advised to call back or seek an in-person evaluation if the symptoms worsen or if the condition fails to improve as anticipated.  Follow-up in 1 year  Alda Berthold, DO

## 2019-08-30 DIAGNOSIS — G4733 Obstructive sleep apnea (adult) (pediatric): Secondary | ICD-10-CM | POA: Diagnosis not present

## 2019-09-01 ENCOUNTER — Ambulatory Visit (INDEPENDENT_AMBULATORY_CARE_PROVIDER_SITE_OTHER): Payer: 59 | Admitting: Orthopaedic Surgery

## 2019-09-01 ENCOUNTER — Other Ambulatory Visit: Payer: Self-pay

## 2019-09-01 ENCOUNTER — Encounter: Payer: Self-pay | Admitting: Orthopaedic Surgery

## 2019-09-01 DIAGNOSIS — Z9889 Other specified postprocedural states: Secondary | ICD-10-CM

## 2019-09-01 NOTE — Progress Notes (Signed)
The patient is 13 days status post a right open carpal tunnel release.  She says she is doing very well postoperative.  She has a history of a previous left carpal tunnel release that did well.  On exam her suture line looks good on the right hand.  Remove the sutures in place Steri-Strips.  She is able to move her fingers and thumb.  There is no atrophy.  The swelling is only slight.  She has minimal numbness in her hand.  Having had this done before she is fully aware of what the recovery will be.  All question concerns were answered addressed.  She can continue to use her hand as comfort allows.  I will see her back in 4 weeks to see how she is doing overall.

## 2019-09-20 ENCOUNTER — Ambulatory Visit: Payer: 59

## 2019-09-29 ENCOUNTER — Other Ambulatory Visit: Payer: Self-pay

## 2019-09-29 ENCOUNTER — Encounter: Payer: Self-pay | Admitting: Orthopaedic Surgery

## 2019-09-29 ENCOUNTER — Ambulatory Visit (INDEPENDENT_AMBULATORY_CARE_PROVIDER_SITE_OTHER): Payer: 59 | Admitting: Orthopaedic Surgery

## 2019-09-29 DIAGNOSIS — Z9889 Other specified postprocedural states: Secondary | ICD-10-CM

## 2019-09-29 NOTE — Progress Notes (Signed)
The patient is now about 6 weeks status post a right open carpal tunnel release.  She is almost 4 months out from a left open carpal tunnel release.  She is very satisfied with her outcomes thus far on both hands.  On exam her incisions of healed nicely.  She has only slightly weak grip strength on the right side comparing the right and left sides but the right side was done more recent.  There is no numbness and tingling.  There is no evidence of muscle atrophy.  She moves her thumb and fingers easily.  At this point follow-up can be as needed since she is doing so well.  All questions and concerns were answered and addressed.  If there is any issues at all she will let us know.

## 2019-11-01 ENCOUNTER — Ambulatory Visit: Payer: 59 | Admitting: Family Medicine

## 2019-11-02 ENCOUNTER — Ambulatory Visit: Payer: 59 | Admitting: Family Medicine

## 2019-11-02 ENCOUNTER — Encounter: Payer: Self-pay | Admitting: Family Medicine

## 2019-11-02 ENCOUNTER — Encounter: Payer: Self-pay | Admitting: Endocrinology

## 2019-11-02 ENCOUNTER — Other Ambulatory Visit: Payer: Self-pay

## 2019-11-02 VITALS — BP 130/70 | HR 73 | Temp 97.1°F | Resp 16 | Ht 62.25 in | Wt 229.5 lb

## 2019-11-02 DIAGNOSIS — Z23 Encounter for immunization: Secondary | ICD-10-CM

## 2019-11-02 DIAGNOSIS — J3089 Other allergic rhinitis: Secondary | ICD-10-CM

## 2019-11-02 DIAGNOSIS — Z9884 Bariatric surgery status: Secondary | ICD-10-CM | POA: Diagnosis not present

## 2019-11-02 DIAGNOSIS — G4733 Obstructive sleep apnea (adult) (pediatric): Secondary | ICD-10-CM | POA: Diagnosis not present

## 2019-11-02 DIAGNOSIS — G7000 Myasthenia gravis without (acute) exacerbation: Secondary | ICD-10-CM

## 2019-11-02 DIAGNOSIS — G629 Polyneuropathy, unspecified: Secondary | ICD-10-CM

## 2019-11-02 DIAGNOSIS — J4530 Mild persistent asthma, uncomplicated: Secondary | ICD-10-CM

## 2019-11-02 DIAGNOSIS — E042 Nontoxic multinodular goiter: Secondary | ICD-10-CM

## 2019-11-02 DIAGNOSIS — E1140 Type 2 diabetes mellitus with diabetic neuropathy, unspecified: Secondary | ICD-10-CM | POA: Diagnosis not present

## 2019-11-02 LAB — POCT GLYCOSYLATED HEMOGLOBIN (HGB A1C): Hemoglobin A1C: 5.3 % (ref 4.0–5.6)

## 2019-11-02 MED ORDER — MONTELUKAST SODIUM 10 MG PO TABS: 10.0000 mg | ORAL_TABLET | Freq: Every day | ORAL | 1 refills | Status: DC

## 2019-11-02 MED ORDER — TRULICITY 1.5 MG/0.5ML ~~LOC~~ SOAJ: 1.5000 mg | SUBCUTANEOUS | 2 refills | Status: DC

## 2019-11-02 MED ORDER — DULOXETINE HCL 60 MG PO CPEP: 60.0000 mg | ORAL_CAPSULE | Freq: Every day | ORAL | 1 refills | Status: DC

## 2019-11-02 MED ORDER — FLUTICASONE-SALMETEROL 100-50 MCG/DOSE IN AEPB: 1.0000 | INHALATION_SPRAY | Freq: Two times a day (BID) | RESPIRATORY_TRACT | 2 refills | Status: DC

## 2019-11-02 NOTE — Progress Notes (Signed)
Name: Jasmine Woods   MRN: [id number]    DOB: 10-01-1955   Date:11/02/2019       Progress Note  Subjective  Chief Complaint  Chief Complaint  Patient presents with  . Diabetes  . Sleeping Problem    She has been having trouble staying asleep for the past 2 months. She is waking up at the same time most nights. She is getting about 5-7 hours of sleep.  . Knee Pain    She has right knee pain x months that is constant and aches.    HPI  DMII:She was off medication afterbariatric surgery in 2014,but we re-started medication 0000000 - Trulicity,she denies polyphagia, polydipsia or polyuria. She has increased physical activity up to 5000 steps daily HgbA1C has been well controlled. Glucose at home has been well controlled, denies hypoglycemic episodes, glucose 90-100  Neuropathy is still 2/10, constant, burning-like on both feet, she is on Cymbalta. She could not tolerate gabapentin or Lyrica and also did not work.  HTN: she is taking medication daily and bp is elevated today, she states in the process of selling a house and moving in to a duplex next to her daughter and son in law, afraid the loan is not going to go through. She is also not sleeping well. Asked her to check her bp at home and if stays high we will have to add low dose norvasc 2.5 mg   Hyperlipidemia: takingCrestor only a couple of times a week, forgets to take it at night, advised to take during the day with bp medication. LDL went up in Feb  Asthma:shewas seeing  Dr. Felicie Morn and PFT, she was placed on Breo and seems to work well for her, she has SOB with activity that may be multifactorial, she hasnot used rescue inhaler recently, doing well on maintenance medication.No cough or wheezing.Symptoms stable , we switched from Breo to Advair back in Feb 2021 because of cost, and symptoms are still controlled  Myasthenia Gravis: diagnosed at [facility] by Dr. Burnett Harry, 09/2015, seronegative, only causing  ptosis and double vision, no other symptoms. She had a negative chest CT for thymoma, and has started on medication ( Mestinon ), she states it works quickly but wears out very fast. She takes before drivingto and until she gets home, takes 4 daily.Does not usually bother her to have double vision when at home.She is now seeing Dr. Posey Pronto, [facility] and had last visit 02/2020   Chronic Tension Headaches: seen by Dr. Marney Setting wastaking Topamax 100 mgbut stopped because of mental fogginess and is doing well even off medication. She is doing well, no recent episodes of headaches.    Abnormal CT thyroid: incidental finding of thyroid calcification of left lobe, she saw Endo and had negative biopsy., she had an US done one year ago, accidentally given levothyroxine by endo and caused TSH suppression,she is still under the care of Dr. Loanne Drilling, off medication , labs done in Feb was normal   Bariatric Surgery/Obesity: she had bariatric surgery back in 2014 , her weight was almost 300 lbs, went down to close to 200lbs, butwasgradually gaining weight,she was started on Trulicity 0000000 and [id number] lbs, butis gaining it back, shewas241 lbsJuly 2020 and we started contrave, she has lost 11 lbs on the medication ( 229 lbs ), she was out of medication for a while and gained some weight , we gave a new rx Feb 2021 she titrated dose up, but there a gap with PA and she had  to re-start about one month ago, therefore only on therapeutic dose for about 2 weeks and has lost from 233 lbs to 229 .8 lbs, she would like to stay on medication.   OSA: shehas a new machine, rx written by Dr. Felicie Morn and she is due for follow up, she will [facility] up [facility] pul[facility]ology  Carpal Tunnel syndrome: had left carpal tunnel release surgery 05/2019 and had right side feb 2021  Dr . Ninfa Linden and is gradually improving, does not seem as strong as the left side   SOB and diaphoresis: seeing cardiologist,  last visit 03/2019 she saw PA Dunn, and on A/P he stated MRI would be indicated to rule out cardiac amyloidosis and it was negative, normal LV and EF , she still has episodes of getting sweaty likely from menopause.    Patient Active Problem List   Diagnosis Date Noted  . Carpal tunnel syndrome, right upper limb 08/19/2019  . Carpal tunnel syndrome, left upper limb 06/08/2019  . Multinodular goiter 11/25/2018  . Essential hypertension 11/11/2018  . DOE (dyspnea on exertion) 04/15/2018  . Hyperlipidemia 12/09/2017  . Diaphoresis 11/26/2017  . Special screening for malignant neoplasms, colon   . Epiretinal membrane (ERM) of left eye 09/09/2016  . Glaucoma, left eye 09/05/2016  . Seronegative myasthenia gravis ([place]) 08/08/2015  . Psoriasis 07/06/2015  . Stress incontinence 07/06/2015  . Chronic tension-type headache, intractable 05/29/2015  . Anxiety and depression 04/12/2015  . Asthma, mild intermittent 04/12/2015  . Bladder polyp 04/12/2015  . Carpal tunnel syndrome 04/12/2015  . Binocular vision disorder with diplopia 04/12/2015  . Dyslipidemia 04/12/2015  . H/O: HTN (hypertension) 04/12/2015  . Hemorrhoids, internal 04/12/2015  . Neuropathy 04/12/2015  . Nuclear sclerotic cataract 04/12/2015  . Morbid obesity ([place]) 04/12/2015  . Obstructive apnea 04/12/2015  . Perennial allergic rhinitis 04/12/2015  . 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 Type 2 diabetes, controlled, with neuropathy ([place]) 04/12/2015  . Vitamin D deficiency 04/12/2015  . Cephalalgia 08/16/2014  . Lump or mass in breast 06/14/2013  . Bariatric surgery status 10/12/2012    Past Surgical History:  Procedure Laterality Date  . ABDOMINAL HYSTERECTOMY  1991  . [place] SURGERY  2014  . CARPAL TUNNEL RELEASE Left 06/08/2019   Procedure: LEFT CARPAL TUNNEL RELEASE;  Surgeon: Mcarthur Rossetti, MD;  Location: [facility];  Service: Orthopedics;  Laterality: Left;  .  CARPAL TUNNEL RELEASE Right 08/19/2019   Procedure: RIGHT CARPAL TUNNEL RELEASE;  Surgeon: Mcarthur Rossetti, MD;  Location: [facility];  Service: Orthopedics;  Laterality: Right;  . CATARACT EXTRACTION W/PHACO Left 01/28/2017   Procedure: CATARACT EXTRACTION PHACO AND INTRAOCULAR LENS PLACEMENT (IOC);  Surgeon: Birder Robson, MD;  Location: [facility];  Service: Ophthalmology;  Laterality: Left;  [place] 00:37 AP% 17.8 CDE 6.61 Fluid pack lot # [id number]  . COLONOSCOPY  2012  . COLONOSCOPY WITH PROPOFOL N/A 09/30/2016   Procedure: COLONOSCOPY WITH PROPOFOL;  Surgeon: Lucilla Lame, MD;  Location: [place];  Service: Endoscopy;  Laterality: N/A;  diabetic - diet controlled sleep apnea  . KNEE SURGERY Right 1979-1980  . OOPHORECTOMY    . PARS PLANA VITRECTOMY Left 03/27/2016   Procedure: PARS PLANA VITRECTOMY WITH 25 GAUGE AND MEMBRANE PEEL, INTERNAL LIMITING MEMBRANE;  Surgeon: Hurman Horn, MD;  Location: [place];  Service: Ophthalmology;  Laterality: Left;  . THYROID SURGERY     BX done was benign  . UPPER GI ENDOSCOPY    .  VITRECTOMY Left 04/27/2016    Family History  Problem Relation Age of Onset  . Diabetes Mother   . Hypertension Mother   . Heart disease Mother 50       stent x 1   . Migraines Sister   . Stroke Maternal Grandfather   . Diabetes Paternal Grandfather   . Muscular dystrophy Sister   . Heart attack Maternal Aunt   . Breast cancer Neg Hx   . Thyroid disease Neg Hx     Social History   Tobacco Use  . Smoking status: Former Smoker    Packs/day: 0.50    Years: 3.00    Pack years: 1.50    Start date: 06/24/1972    Quit date: 06/25/1975    Years since quitting: 44.3  . Smokeless tobacco: Never Used  . Tobacco comment: smoked as teenager  Substance Use Topics  . Alcohol use: No    Alcohol/week: 0.0 standard drinks     Current Outpatient Medications:  .  ACCU-CHEK GUIDE test strip, USE AS DIRECTED, Disp: 100 each, Rfl: 12 .   acetaminophen (TYLENOL) 500 MG tablet, Take 1,000 mg by mouth every 6 (six) hours as needed for mild pain. Reported on 07/06/2015, Disp: , Rfl:  .  brimonidine-timolol (COMBIGAN) 0.2-0.5 % ophthalmic solution, 1 drop daily. , Disp: , Rfl:  .  budesonide (RHINOCORT AQUA) 32 MCG/ACT nasal spray, Place 2 sprays into both nostrils at bedtime., Disp: 15 mL, Rfl: 2 .  Coenzyme Q10 (CO Q 10) 100 MG CAPS, Take 1 capsule by mouth daily., Disp: 30 capsule, Rfl: 2 .  Dulaglutide (TRULICITY) 1.5 0000000 SOPN, Inject 1.5 mg into the skin once a week., Disp: 4 pen, Rfl: 2 .  DULoxetine (CYMBALTA) 60 MG capsule, Take 1 capsule (60 mg total) by mouth daily., Disp: 90 capsule, Rfl: 1 .  Fluticasone-Salmeterol (ADVAIR) 100-50 MCG/DOSE AEPB, Inhale 1 puff into the lungs 2 (two) times daily., Disp: 1 each, Rfl: 2 .  latanoprost (XALATAN) 0.005 % ophthalmic solution, Place 1 drop into the left eye at bedtime. , Disp: , Rfl:  .  loratadine (CLARITIN) 10 MG tablet, Take 1 tablet (10 mg total) by mouth 2 (two) times daily. (Patient taking differently: Take 10 mg by mouth daily. ), Disp: 60 tablet, Rfl: 0 .  montelukast (SINGULAIR) 10 MG tablet, Take 1 tablet (10 mg total) by mouth at bedtime., Disp: 90 tablet, Rfl: 1 .  Multiple Vitamins-Minerals (MULTIVITAMIN WITH MINERALS) tablet, Take 1 tablet by mouth daily., Disp: , Rfl:  .  Naltrexone-buPROPion HCl ER (CONTRAVE) 8-90 MG TB12, Take 2 tablets by mouth 2 (two) times daily., Disp: 120 tablet, Rfl: 2 .  Omega-3 Fatty Acids (FISH OIL) 1200 MG CAPS, Take 2 capsules by mouth daily., Disp: , Rfl:  .  pyridostigmine (MESTINON) 60 MG tablet, Take 1 tablet at 6:30a, 11a, and 3:30p.  OK to take extra dose as needed., Disp: 360 tablet, Rfl: 3 .  rosuvastatin (CRESTOR) 5 MG tablet, Take 1 tablet (5 mg total) by mouth daily., Disp: 90 tablet, Rfl: 1 .  telmisartan-hydrochlorothiazide (MICARDIS HCT) 80-25 MG tablet, Take 1 tablet by mouth daily., Disp: 90 tablet, Rfl: 3 .  VENTOLIN  HFA 108 (90 Base) MCG/ACT inhaler, INHALE 2 PUFFS BY MOUTH EVERY 4 HOURS AS NEEDED SHORTNESS OF BREATH/WHEEZING, Disp: 18 g, Rfl: 0 .  HYDROcodone-acetaminophen (NORCO/VICODIN) 5-325 MG tablet, Take 1-2 tablets by mouth every 6 (six) hours as needed for moderate pain. (Patient not taking: Reported on 11/02/2019), Disp:  30 tablet, Rfl: 0  Allergies  Allergen Reactions  . Aspirin Swelling    Upset stomach  Upset stomach   . Azithromycin     Contraindicated in Myasthenia   . Ace Inhibitors     cough  . Amlodipine     Edema of feet and hands  . Gabapentin     anxiety  . Pregabalin Other (See Comments)    anxiety    I personally reviewed active problem list, medication list, allergies, family history, social history, health maintenance with the patient/caregiver today.   ROS  Constitutional: Negative for fever or weight change.  Respiratory: Negative for cough and shortness of breath.   Cardiovascular: Negative for chest pain or palpitations.  Gastrointestinal: Negative for abdominal pain, no bowel changes.  Musculoskeletal: Negative for gait problem or joint swelling.  Skin: Negative for rash.  Neurological: Negative for dizziness or headache.  No other specific complaints in a complete review of systems (except as listed in HPI above).  Objective  Vitals:   11/02/19 1550  BP: (!) 150/80  Pulse: 73  Resp: 16  Temp: (!) 97.1 F (36.2 C)  TempSrc: Temporal  SpO2: 98%  Weight: 229 lb 8 oz (104.1 kg)  Height: 5' 2.25" (1.581 m)    Body mass index is 41.64 kg/m.  Physical Exam  Constitutional: Patient appears well-developed and well-nourished. Obese No distress.  HEENT: head atraumatic, normocephalic, pupils equal and reactive to light Cardiovascular: Normal rate, regular rhythm and normal heart sounds.  No murmur heard. No BLE edema. Pulmonary/Chest: Effort normal and breath sounds normal. No respiratory distress. Abdominal: Soft.  There is no  tenderness. Psychiatric: Patient has a normal mood and affect. behavior is normal. Judgment and thought content normal.  Recent Results (from the past 2160 hour(s))  CBC with Differential/Platelet     Status: Abnormal   Collection Time: 08/04/19  3:54 PM  Result Value Ref Range   WBC 11.2 (H) 3.8 - 10.8 Thousand/uL   RBC 4.27 3.80 - 5.10 Million/uL   Hemoglobin 12.8 11.7 - 15.5 g/dL   HCT 37.2 35.0 - 45.0 %   MCV 87.1 80.0 - 100.0 fL   MCH 30.0 27.0 - 33.0 pg   MCHC 34.4 32.0 - 36.0 g/dL   RDW 12.5 11.0 - 15.0 %   Platelets 301 140 - 400 Thousand/uL   MPV 10.1 7.5 - 12.5 fL   Neutro Abs 5,981 1,500 - 7,800 cells/uL   Lymphs Abs 4,155 (H) 850 - 3,900 cells/uL   Absolute Monocytes 840 200 - 950 cells/uL   Eosinophils Absolute 179 15 - 500 cells/uL   Basophils Absolute 45 0 - 200 cells/uL   Neutrophils Relative % 53.4 %   Total Lymphocyte 37.1 %   Monocytes Relative 7.5 %   Eosinophils Relative 1.6 %   Basophils Relative 0.4 %  COMPLETE METABOLIC PANEL WITH GFR     Status: None   Collection Time: 08/04/19  3:54 PM  Result Value Ref Range   Glucose, Bld 81 65 - 99 mg/dL    Comment: .            Fasting reference interval .    BUN 17 7 - 25 mg/dL   Creat 0.57 0.50 - 0.99 mg/dL    Comment: For patients >7 years of age, the reference limit for Creatinine is approximately 13% higher for people identified as African-American. .    GFR, Est Non African American 98 > OR = 60 mL/min/1.67m2   GFR,  Est African American 114 > OR = 60 mL/min/1.79m2   BUN/Creatinine Ratio NOT APPLICABLE 6 - 22 (calc)   Sodium 138 135 - 146 mmol/L   Potassium 4.0 3.5 - 5.3 mmol/L   Chloride 103 98 - 110 mmol/L   CO2 27 20 - 32 mmol/L   Calcium 9.3 8.6 - 10.4 mg/dL   Total Protein 6.9 6.1 - 8.1 g/dL   Albumin 4.2 3.6 - 5.1 g/dL   Globulin 2.7 1.9 - 3.7 g/dL (calc)   AG Ratio 1.6 1.0 - 2.5 (calc)   Total Bilirubin 0.4 0.2 - 1.2 mg/dL   Alkaline phosphatase (APISO) 40 37 - 153 U/L   AST 22 10 - 35  U/L   ALT 25 6 - 29 U/L  Hemoglobin A1c     Status: None   Collection Time: 08/04/19  3:54 PM  Result Value Ref Range   Hgb A1c MFr Bld 5.6 <5.7 % of total Hgb    Comment: For the purpose of screening for the presence of diabetes: . <5.7%       Consistent with the absence of diabetes 5.7-6.4%    Consistent with increased risk for diabetes             (prediabetes) > or =6.5%  Consistent with diabetes . This assay result is consistent with a decreased risk of diabetes. . Currently, no consensus exists regarding use of hemoglobin A1c for diagnosis of diabetes in children. . According to American Diabetes Association (ADA) guidelines, hemoglobin A1c <7.0% represents optimal control in non-pregnant diabetic patients. Different metrics may apply to specific patient populations.  Standards of Medical Care in Diabetes(ADA). .    Mean Plasma Glucose 114 (calc)   eAG (mmol/L) 6.3 (calc)  Thyroid Panel With TSH     Status: None   Collection Time: 08/04/19  3:54 PM  Result Value Ref Range   T3 Uptake 27 22 - 35 %   T4, Total 7.8 5.1 - 11.9 mcg/dL   Free Thyroxine Index 2.1 1.4 - 3.8   TSH 1.00 0.40 - 4.50 mIU/L  Lipid panel     Status: Abnormal   Collection Time: 08/04/19  3:54 PM  Result Value Ref Range   Cholesterol 237 (H) <200 mg/dL   HDL 72 > OR = 50 mg/dL   Triglycerides 134 <150 mg/dL   LDL Cholesterol (Calc) 139 (H) mg/dL (calc)    Comment: Reference range: <100 . Desirable range <100 mg/dL for primary prevention;   <70 mg/dL for patients with CHD or diabetic patients  with > or = 2 CHD risk factors. Marland Kitchen LDL-C is now calculated using the Martin-Hopkins  calculation, which is a validated novel method providing  better accuracy than the Friedewald equation in the  estimation of LDL-C.  Cresenciano Genre et al. Annamaria Helling. [id number]): 2061-2068  (http://education.[facility].com/faq/FAQ164)    Total CHOL/HDL Ratio 3.3 <5.0 (calc)   Non-HDL Cholesterol (Calc) 165 (H) <130  mg/dL (calc)    Comment: For patients with diabetes plus 1 major ASCVD risk  factor, treating to a non-HDL-C goal of <100 mg/dL  (LDL-C of <70 mg/dL) is considered a therapeutic  option.   VITAMIN D 25 Hydroxy (Vit-D Deficiency, Fractures)     Status: Abnormal   Collection Time: 08/04/19  3:54 PM  Result Value Ref Range   Vit D, 25-Hydroxy 15 (L) 30 - 100 ng/mL    Comment: Vitamin D Status         25-OH Vitamin D: . Deficiency:                    <  20 ng/mL Insufficiency:             20 - 29 ng/mL Optimal:                 > or = 30 ng/mL . For 25-OH Vitamin D testing on patients on  D2-supplementation and patients for whom quantitation  of D2 and D3 fractions is required, the Quest[facility]ureD(TM) 25-OH VIT D, (D2,D3), LC/MS/MS is recommended: order  code [id number] (patients >47yrs). See Note 1 . Note 1 . For additional information, please refer to  http://education.[facility].com/faq/FAQ199  (This link is being provided for informational/ educational purposes only.)   Vitamin B12     Status: None   Collection Time: 08/04/19  3:54 PM  Result Value Ref Range   Vitamin B-12 445 200 - 1,100 pg/mL  CBC     Status: Abnormal   Collection Time: 08/11/19  4:35 PM  Result Value Ref Range   WBC 12.4 (H) 3.8 - 10.8 Thousand/uL   RBC 4.55 3.80 - 5.10 Million/uL   Hemoglobin 13.5 11.7 - 15.5 g/dL   HCT 40.0 35.0 - 45.0 %   MCV 87.9 80.0 - 100.0 fL   MCH 29.7 27.0 - 33.0 pg   MCHC 33.8 32.0 - 36.0 g/dL   RDW 12.5 11.0 - 15.0 %   Platelets 345 140 - 400 Thousand/uL   MPV 10.1 7.5 - 12.5 fL  CULTURE, URINE COMPREHENSIVE     Status: None   Collection Time: 08/13/19  3:26 PM   Specimen: Urine  Result Value Ref Range   MICRO NUMBER: [id number]    SPECIMEN QUALITY: Adequate    Source OTHER (SPECIFY)    STATUS: FINAL    RESULT: No Growth   CBC with Differential/Platelet     Status: None   Collection Time: 08/16/19  8:22 AM  Result Value Ref Range   WBC 7.5 3.8 - 10.8 Thousand/uL   RBC  4.70 3.80 - 5.10 Million/uL   Hemoglobin 13.9 11.7 - 15.5 g/dL   HCT 40.8 35.0 - 45.0 %   MCV 86.8 80.0 - 100.0 fL   MCH 29.6 27.0 - 33.0 pg   MCHC 34.1 32.0 - 36.0 g/dL   RDW 12.4 11.0 - 15.0 %   Platelets 315 140 - 400 Thousand/uL   MPV 10.0 7.5 - 12.5 fL   Neutro Abs 3,840 1,500 - 7,800 cells/uL   Lymphs Abs 2,828 850 - 3,900 cells/uL   Absolute Monocytes 675 200 - 950 cells/uL   Eosinophils Absolute 120 15 - 500 cells/uL   Basophils Absolute 38 0 - 200 cells/uL   Neutrophils Relative % 51.2 %   Total Lymphocyte 37.7 %   Monocytes Relative 9.0 %   Eosinophils Relative 1.6 %   Basophils Relative 0.5 %  SARS CORONAVIRUS 2 (TAT 6-24 HRS) Nasopharyngeal Nasopharyngeal Swab     Status: None   Collection Time: 08/16/19 10:32 AM   Specimen: Nasopharyngeal Swab  Result Value Ref Range   SARS Coronavirus 2 NEGATIVE NEGATIVE    Comment: (NOTE) SARS-CoV-2 target nucleic acids are NOT DETECTED. The SARS-CoV-2 RNA is generally detectable in upper and lower respiratory specimens during the acute phase of infection. Negative results do not preclude SARS-CoV-2 infection, do not rule out co-infections with other pathogens, and should not be used as the sole basis for treatment or other patient management decisions. Negative results must be combined with clinical observations, patient history, and epidemiological information. The expected result is Negative. Fact Sheet for Patients: SugarRoll.be  Fact Sheet for Healthcare Providers: https://www.woods-mathews.com/ This test is not yet approved or cleared by the [place] FDA and  has been authorized for detection and/or diagnosis of SARS-CoV-2 by FDA under an Emergency Use Authorization (EUA). This EUA will remain  in effect (meaning this test can be used) for the duration of the COVID-19 declaration under Section 56 4(b)(1) of the Act, 21 U.S.C. section 360bbb-3(b)(1), unless the authorization is  terminated or revoked sooner. Performed at [facility], [place] [address], [place], [place] [id number]   Basic metabolic panel     Status: None   Collection Time: 08/16/19 12:48 PM  Result Value Ref Range   Sodium 140 135 - 145 mmol/L   Potassium 4.6 3.5 - 5.1 mmol/L   Chloride 102 98 - 111 mmol/L   CO2 26 22 - 32 mmol/L   Glucose, Bld 79 70 - 99 mg/dL   BUN 16 8 - 23 mg/dL   Creatinine, Ser 0.57 0.44 - 1.00 mg/dL   Calcium 9.4 8.9 - 10.3 mg/dL   GFR calc non Af Amer >60 >60 mL/min   GFR calc Af Amer >60 >60 mL/min   Anion gap 12 5 - 15    Comment: Performed at [facility] [address] [place], [place] [address]  Glucose, capillary     Status: None   Collection Time: 08/19/19  9:06 AM  Result Value Ref Range   Glucose-Capillary 95 70 - 99 mg/dL    Comment: Glucose reference range applies only to samples taken after fasting for at least 8 hours.  Glucose, capillary     Status: None   Collection Time: 08/19/19 11:27 AM  Result Value Ref Range   Glucose-Capillary 84 70 - 99 mg/dL    Comment: Glucose reference range applies only to samples taken after fasting for at least 8 hours.     PHQ2/9: Depression screen [facility] 2/9 11/02/2019 08/13/2019 08/04/2019 05/05/2019 01/18/2019  Decreased Interest 0 0 0 0 0  Down, Depressed, Hopeless 0 0 0 0 0  PHQ - 2 Score 0 0 0 0 0  Altered sleeping 3 0 0 0 0  Tired, decreased energy 1 1 1  0 1  Change in appetite 0 1 1 0 1  Feeling bad or failure about yourself  0 0 0 0 0  Trouble concentrating 0 0 0 0 0  Moving slowly or fidgety/restless 0 0 0 0 0  Suicidal thoughts 0 0 0 0 0  PHQ-9 Score 4 2 2  0 2  Difficult doing work/chores Somewhat difficult Not difficult at all Not difficult at all - -  Some recent data might be hidden    phq 9 is negative   Fall Risk: Fall Risk  11/02/2019 08/13/2019 08/04/2019 05/05/2019 04/08/2019  Falls in the past year? 0 - 0 0 1  Number falls in past yr: 0 0 0 0 1  Injury with Fall? 0 0 0 0 0      Assessment & Plan  1. Type 2 diabetes, controlled, with neuropathy (HCC)  - POCT HgB A1C - DULoxetine (CYMBALTA) 60 MG capsule; Take 1 capsule (60 mg total) by mouth daily.  Dispense: 90 capsule; Refill: 1 - Dulaglutide (TRULICITY) 1.5 0000000 SOPN; Inject 1.5 mg into the skin once a week.  Dispense: 4 pen; Refill: 2  2. Mild persistent asthma without complication  - Fluticasone-Salmeterol (ADVAIR) 100-50 MCG/DOSE AEPB; Inhale 1 puff into the lungs 2 (two) times daily.  Dispense: 1 each;  Refill: 2  3. Peripheral polyneuropathy  - DULoxetine (CYMBALTA) 60 MG capsule; Take 1 capsule (60 mg total) by mouth daily.  Dispense: 90 capsule; Refill: 1  4. Perennial allergic rhinitis  - montelukast (SINGULAIR) 10 MG tablet; Take 1 tablet (10 mg total) by mouth at bedtime.  Dispense: 90 tablet; Refill: 1  5. Seronegative myasthenia gravis ([place])  Keep follow up with neurologist   6. Obstructive apnea   7. Multinodular goiter  No dysphagia   8. History of bariatric surgery  Trying to lose weight again

## 2019-11-03 DIAGNOSIS — J3089 Other allergic rhinitis: Secondary | ICD-10-CM | POA: Diagnosis not present

## 2019-11-03 DIAGNOSIS — G4733 Obstructive sleep apnea (adult) (pediatric): Secondary | ICD-10-CM | POA: Diagnosis not present

## 2019-11-03 DIAGNOSIS — G629 Polyneuropathy, unspecified: Secondary | ICD-10-CM | POA: Diagnosis not present

## 2019-11-03 DIAGNOSIS — Z23 Encounter for immunization: Secondary | ICD-10-CM | POA: Diagnosis not present

## 2019-11-03 DIAGNOSIS — Z9884 Bariatric surgery status: Secondary | ICD-10-CM | POA: Diagnosis not present

## 2019-11-03 DIAGNOSIS — E042 Nontoxic multinodular goiter: Secondary | ICD-10-CM | POA: Diagnosis not present

## 2019-11-03 DIAGNOSIS — G7000 Myasthenia gravis without (acute) exacerbation: Secondary | ICD-10-CM | POA: Diagnosis not present

## 2019-11-03 DIAGNOSIS — E1140 Type 2 diabetes mellitus with diabetic neuropathy, unspecified: Secondary | ICD-10-CM | POA: Diagnosis not present

## 2019-11-03 DIAGNOSIS — J4530 Mild persistent asthma, uncomplicated: Secondary | ICD-10-CM | POA: Diagnosis not present

## 2019-11-03 NOTE — Addendum Note (Signed)
Addended by: Chilton Greathouse on: 11/03/2019 10:40 AM   Modules accepted: Orders

## 2019-11-18 DIAGNOSIS — J45909 Unspecified asthma, uncomplicated: Secondary | ICD-10-CM | POA: Diagnosis not present

## 2019-11-18 DIAGNOSIS — G4733 Obstructive sleep apnea (adult) (pediatric): Secondary | ICD-10-CM | POA: Diagnosis not present

## 2019-12-03 ENCOUNTER — Other Ambulatory Visit: Payer: Self-pay | Admitting: Internal Medicine

## 2019-12-03 DIAGNOSIS — G4733 Obstructive sleep apnea (adult) (pediatric): Secondary | ICD-10-CM | POA: Diagnosis not present

## 2020-01-04 ENCOUNTER — Ambulatory Visit: Payer: 59 | Admitting: Endocrinology

## 2020-01-04 ENCOUNTER — Encounter: Payer: Self-pay | Admitting: Endocrinology

## 2020-01-04 ENCOUNTER — Other Ambulatory Visit: Payer: Self-pay

## 2020-01-04 VITALS — BP 144/82 | HR 65 | Wt 229.8 lb

## 2020-01-04 DIAGNOSIS — E042 Nontoxic multinodular goiter: Secondary | ICD-10-CM

## 2020-01-04 NOTE — Patient Instructions (Addendum)
Your blood pressure is high today.  Please see your primary care provider soon, to have it rechecked.   Let's recheck the ultrasound.  you will receive a phone call, about a day and time for an appointment.  If there is no change, please come back for a follow-up appointment in 2 years.

## 2020-01-04 NOTE — Progress Notes (Signed)
Subjective:    Patient ID: Jasmine Woods, female    DOB: 1955/12/27, 64 y.o.   MRN: [id number]  HPI Pt returns for f/u of MNG (dx'ed 2017; she had bxs in 2017 and 2018, in [place] (results not available, but reported as benign); f/u US in 2019 was unchanged; she is euthyroid off thyroid rx).  She does not notice the goiter.   Past Medical History:  Diagnosis Date  . Allergy   . Anxiety   . Asthma   . Bladder polyp    Dr. Ernst Spell  . Carpal tunnel syndrome   . Complication of anesthesia    needed nebulizer after surgery  . Depression   . Diabetes mellitus without complication ([place])    Diet controlled  . Double vision    seen by Dr. 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 Dyslipidemia   . Frequency of urination   . Glaucoma, left eye   . H/O myasthenia gravis    MOSTLY AFFECTS EYES  . HA (headache)   . Heart murmur    followed by PCP  . Hx of bariatric surgery   . Hypertension    no meds  . Internal hemorrhoids   . Myasthenia gravis ([place])    "mostly effects eyes"  . Myasthenia gravis ([place])   . Neuropathy, generalized    diabetic on feet  . Nuclear sclerotic cataract of both eyes   . Obesity (BMI 30-39.9)   . Obstructive sleep apnea on CPAP    uses cpap  . Patient is Jehovah's Witness    no blood products  . Rotator cuff tendonitis 07/2009   also has impingment right shoulder pain, seen by Dr. Mack Guise  . Sleep apnea   . Thyroid nodule   . Vaginal dryness, menopausal   . Vitamin D deficiency     Past Surgical History:  Procedure Laterality Date  . ABDOMINAL HYSTERECTOMY  1991  . [place] SURGERY  2014  . CARPAL TUNNEL RELEASE Left 06/08/2019   Procedure: LEFT CARPAL TUNNEL RELEASE;  Surgeon: Mcarthur Rossetti, MD;  Location: [place];  Service: Orthopedics;  Laterality: Left;  . CARPAL TUNNEL RELEASE Right 08/19/2019   Procedure: RIGHT CARPAL TUNNEL RELEASE;  Surgeon: Mcarthur Rossetti, MD;  Location: [place];  Service: Orthopedics;  Laterality: Right;  . CATARACT EXTRACTION W/PHACO Left 01/28/2017   Procedure: CATARACT EXTRACTION PHACO AND INTRAOCULAR LENS PLACEMENT (IOC);  Surgeon: Birder Robson, MD;  Location: [facility];  Service: Ophthalmology;  Laterality: Left;  [place] 00:37 AP% 17.8 CDE 6.61 Fluid pack lot # [id number]  . COLONOSCOPY  2012  . COLONOSCOPY WITH PROPOFOL N/A 09/30/2016   Procedure: COLONOSCOPY WITH PROPOFOL;  Surgeon: Lucilla Lame, MD;  Location: [place];  Service: Endoscopy;  Laterality: N/A;  diabetic - diet controlled sleep apnea  . KNEE SURGERY Right 1979-1980  . OOPHORECTOMY    . PARS PLANA VITRECTOMY Left 03/27/2016   Procedure: PARS PLANA VITRECTOMY WITH 25 GAUGE AND MEMBRANE PEEL, INTERNAL LIMITING MEMBRANE;  Surgeon: Hurman Horn, MD;  Location: [place];  Service: Ophthalmology;  Laterality: Left;  . THYROID SURGERY     BX done was benign  . UPPER GI ENDOSCOPY    . VITRECTOMY Left 04/27/2016    Social History   Socioeconomic History  . Marital status: Married    Spouse name: Gwyndolyn Saxon "Butch"  . Number of children: 2  . Years of education: 83  . Highest education level: Associate degree: occupational,  technical, or vocational program  Occupational History  . Occupation: [employer]  Tobacco Use  . Smoking status: Former Smoker    Packs/day: 0.50    Years: 3.00    Pack years: 1.50    Start date: 06/24/1972    Quit date: 06/25/1975    Years since quitting: 44.5  . Smokeless tobacco: Never Used  . Tobacco comment: smoked as teenager  Vaping Use  . Vaping Use: Never used  Substance and Sexual Activity  . Alcohol use: No    Alcohol/week: 0.0 standard drinks  . Drug use: No  . Sexual activity: Not Currently    Partners: Male  Other Topics Concern  . Not on file  Social History Narrative   Right handed   One story home   12 years education   Clerical workers [employer]    Lives with husband   Social Determinants of Health   Financial  Resource Strain:   . Difficulty of Paying Living Expenses:   Food Insecurity:   . Worried About Charity fundraiser in the Last Year:   . Arboriculturist in the Last Year:   Transportation Needs:   . Film/video editor (Medical):   Marland Kitchen Lack of Transportation (Non-Medical):   Physical Activity:   . Days of Exercise per Week:   . Minutes of Exercise per Session:   Stress:   . Feeling of Stress :   Social Connections:   . Frequency of Communication with Friends and Family:   . Frequency of Social Gatherings with Friends and Family:   . Attends Religious Services:   . Active Member of Clubs or Organizations:   . Attends Archivist Meetings:   Marland Kitchen Marital Status:   Intimate Partner Violence:   . Fear of Current or Ex-Partner:   . Emotionally Abused:   Marland Kitchen Physically Abused:   . Sexually Abused:     Current Outpatient Medications on File Prior to Visit  Medication Sig Dispense Refill  . ACCU-CHEK GUIDE test strip USE AS DIRECTED 100 each 12  . acetaminophen (TYLENOL) 500 MG tablet Take 1,000 mg by mouth every 6 (six) hours as needed for mild pain. Reported on 07/06/2015    . brimonidine-timolol (COMBIGAN) 0.2-0.5 % ophthalmic solution 1 drop daily.     . budesonide (RHINOCORT AQUA) 32 MCG/ACT nasal spray Place 2 sprays into both nostrils at bedtime. 15 mL 2  . Coenzyme Q10 (CO Q 10) 100 MG CAPS Take 1 capsule by mouth daily. 30 capsule 2  . Dulaglutide (TRULICITY) 1.5 PY/1.9JK SOPN Inject 1.5 mg into the skin once a week. 4 pen 2  . DULoxetine (CYMBALTA) 60 MG capsule Take 1 capsule (60 mg total) by mouth daily. 90 capsule 1  . Fluticasone-Salmeterol (ADVAIR) 100-50 MCG/DOSE AEPB Inhale 1 puff into the lungs 2 (two) times daily. 1 each 2  . latanoprost (XALATAN) 0.005 % ophthalmic solution Place 1 drop into the left eye at bedtime.     Marland Kitchen loratadine (CLARITIN) 10 MG tablet Take 1 tablet (10 mg total) by mouth 2 (two) times daily. (Patient taking differently: Take 10 mg by mouth  daily. ) 60 tablet 0  . montelukast (SINGULAIR) 10 MG tablet Take 1 tablet (10 mg total) by mouth at bedtime. 90 tablet 1  . Multiple Vitamins-Minerals (MULTIVITAMIN WITH MINERALS) tablet Take 1 tablet by mouth daily.    . Naltrexone-buPROPion HCl ER (CONTRAVE) 8-90 MG TB12 Take 2 tablets by mouth 2 (two) times daily. [place]  tablet 2  . Omega-3 Fatty Acids (FISH OIL) 1200 MG CAPS Take 2 capsules by mouth daily.    Marland Kitchen pyridostigmine (MESTINON) 60 MG tablet Take 1 tablet at 6:30a, 11a, and 3:30p.  OK to take extra dose as needed. 360 tablet 3  . rosuvastatin (CRESTOR) 5 MG tablet Take 1 tablet (5 mg total) by mouth daily. 90 tablet 1  . telmisartan-hydrochlorothiazide (MICARDIS HCT) 80-25 MG tablet TAKE 1 TABLET BY MOUTH DAILY. 90 tablet 1  . VENTOLIN HFA 108 (90 Base) MCG/ACT inhaler INHALE 2 PUFFS BY MOUTH EVERY 4 HOURS AS NEEDED SHORTNESS OF BREATH/WHEEZING 18 g 0   No current facility-administered medications on file prior to visit.    Allergies  Allergen Reactions  . Aspirin Swelling    Upset stomach  Upset stomach   . Azithromycin     Contraindicated in Myasthenia   . Ace Inhibitors     cough  . Amlodipine     Edema of feet and hands  . Gabapentin     anxiety  . Pregabalin Other (See Comments)    anxiety    Family History  Problem Relation Age of Onset  . Diabetes Mother   . Hypertension Mother   . Heart disease Mother 26       stent x 1   . Migraines Sister   . Stroke Maternal Grandfather   . Diabetes Paternal Grandfather   . Muscular dystrophy Sister   . Heart attack Maternal Aunt   . Breast cancer Neg Hx   . Thyroid disease Neg Hx     BP (!) 144/82 (BP Location: Left Arm, Patient Position: Sitting, Cuff Size: Large)   Pulse 65   Wt 229 lb 12.8 oz (104.2 kg)   SpO2 97%   BMI 41.69 kg/m    Review of Systems Denies neck pain and swelling.      Objective:   Physical Exam VITAL SIGNS:  See vs page GENERAL: no distress NECK: There is no palpable thyroid  enlargement.  No thyroid nodule is palpable.  No palpable lymphadenopathy at the anterior neck.    Lab Results  Component Value Date   TSH 1.00 08/04/2019   T4TOTAL 7.8 08/04/2019       Assessment & Plan:  HTN: is noted today.  MNG, due for recheck.    Patient Instructions  Your blood pressure is high today.  Please see your primary care provider soon, to have it rechecked.   Let's recheck the ultrasound.  you will receive a phone call, about a day and time for an appointment.  If there is no change, please come back for a follow-up appointment in 2 years.

## 2020-01-13 ENCOUNTER — Ambulatory Visit
Admission: RE | Admit: 2020-01-13 | Discharge: 2020-01-13 | Disposition: A | Discharge status: Home / Self Care | Payer: 59 | Source: Ambulatory Visit | Attending: Endocrinology | Admitting: Endocrinology

## 2020-01-13 ENCOUNTER — Other Ambulatory Visit: Payer: Self-pay

## 2020-01-13 DIAGNOSIS — E042 Nontoxic multinodular goiter: Secondary | ICD-10-CM | POA: Diagnosis not present

## 2020-02-04 ENCOUNTER — Ambulatory Visit: Payer: 59 | Admitting: Family Medicine

## 2020-03-20 ENCOUNTER — Ambulatory Visit: Payer: 59 | Admitting: Family Medicine

## 2020-03-28 DIAGNOSIS — G4733 Obstructive sleep apnea (adult) (pediatric): Secondary | ICD-10-CM | POA: Diagnosis not present

## 2020-03-29 ENCOUNTER — Other Ambulatory Visit: Payer: Self-pay

## 2020-03-29 ENCOUNTER — Encounter: Payer: Self-pay | Admitting: Family Medicine

## 2020-03-29 ENCOUNTER — Ambulatory Visit: Payer: 59 | Admitting: Family Medicine

## 2020-03-29 ENCOUNTER — Other Ambulatory Visit: Payer: Self-pay | Admitting: Family Medicine

## 2020-03-29 VITALS — BP 130/82 | HR 74 | Temp 98.0°F | Resp 16 | Ht 62.0 in | Wt 229.5 lb

## 2020-03-29 DIAGNOSIS — E559 Vitamin D deficiency, unspecified: Secondary | ICD-10-CM

## 2020-03-29 DIAGNOSIS — Z23 Encounter for immunization: Secondary | ICD-10-CM

## 2020-03-29 DIAGNOSIS — E1140 Type 2 diabetes mellitus with diabetic neuropathy, unspecified: Secondary | ICD-10-CM | POA: Diagnosis not present

## 2020-03-29 DIAGNOSIS — E1169 Type 2 diabetes mellitus with other specified complication: Secondary | ICD-10-CM

## 2020-03-29 DIAGNOSIS — J4530 Mild persistent asthma, uncomplicated: Secondary | ICD-10-CM | POA: Diagnosis not present

## 2020-03-29 DIAGNOSIS — Z79899 Other long term (current) drug therapy: Secondary | ICD-10-CM

## 2020-03-29 DIAGNOSIS — G4733 Obstructive sleep apnea (adult) (pediatric): Secondary | ICD-10-CM

## 2020-03-29 DIAGNOSIS — J3089 Other allergic rhinitis: Secondary | ICD-10-CM | POA: Diagnosis not present

## 2020-03-29 DIAGNOSIS — G7000 Myasthenia gravis without (acute) exacerbation: Secondary | ICD-10-CM

## 2020-03-29 DIAGNOSIS — Z9884 Bariatric surgery status: Secondary | ICD-10-CM

## 2020-03-29 DIAGNOSIS — E785 Hyperlipidemia, unspecified: Secondary | ICD-10-CM

## 2020-03-29 DIAGNOSIS — E042 Nontoxic multinodular goiter: Secondary | ICD-10-CM

## 2020-03-29 DIAGNOSIS — Z114 Encounter for screening for human immunodeficiency virus [HIV]: Secondary | ICD-10-CM

## 2020-03-29 DIAGNOSIS — I10 Essential (primary) hypertension: Secondary | ICD-10-CM

## 2020-03-29 DIAGNOSIS — G629 Polyneuropathy, unspecified: Secondary | ICD-10-CM

## 2020-03-29 MED ORDER — ALBUTEROL SULFATE HFA 108 (90 BASE) MCG/ACT IN AERS: 2.0000 | INHALATION_SPRAY | RESPIRATORY_TRACT | 0 refills | Status: DC | PRN

## 2020-03-29 MED ORDER — TRULICITY 1.5 MG/0.5ML ~~LOC~~ SOAJ: 1.5000 mg | SUBCUTANEOUS | 2 refills | Status: DC

## 2020-03-29 MED ORDER — DULOXETINE HCL 60 MG PO CPEP: 60.0000 mg | ORAL_CAPSULE | Freq: Every day | ORAL | 1 refills | Status: DC

## 2020-03-29 MED ORDER — FLUTICASONE-SALMETEROL 100-50 MCG/DOSE IN AEPB: 1.0000 | INHALATION_SPRAY | Freq: Two times a day (BID) | RESPIRATORY_TRACT | 2 refills | Status: DC

## 2020-03-29 MED ORDER — CONTRAVE 8-90 MG PO TB12: 2.0000 | ORAL_TABLET | Freq: Two times a day (BID) | ORAL | 2 refills | Status: DC

## 2020-03-29 MED ORDER — ROSUVASTATIN CALCIUM 5 MG PO TABS: 5.0000 mg | ORAL_TABLET | Freq: Every day | ORAL | 1 refills | Status: DC

## 2020-03-29 MED ORDER — TELMISARTAN-HCTZ 80-25 MG PO TABS
1.0000 | ORAL_TABLET | Freq: Every day | ORAL | 1 refills | Status: DC
Start: 2020-03-29 — End: 2020-03-29

## 2020-03-29 MED ORDER — MONTELUKAST SODIUM 10 MG PO TABS: 10.0000 mg | ORAL_TABLET | Freq: Every day | ORAL | 1 refills | Status: DC

## 2020-03-29 NOTE — Progress Notes (Signed)
Name: Jasmine Woods   MRN: [id number]    DOB: 10/23/1955   Date:03/29/2020       Progress Note  Subjective  Chief Complaint  Chief Complaint  Patient presents with  . Follow-up    3 month follow up    HPI  DMII:She was off medication afterbariatric surgery in 2014,but we re-started medication 95/6387 - Trulicity,she denies polyphagia, polydipsia or polyuria. Shehas increased physical activity up to 5000 steps daily, she states she was not eating well when she moved to a new place and had to eat out until appliances were placed.  HgbA1Chas been well controlled. Glucose at home has been well controlled, denies hypoglycemic episodes, glucose around 100 fasting  Neuropathy is still 2/10, constant, burning-like on both feet, she is on Cymbalta. She could not tolerate gabapentin or Lyrica and also did not work.  HTN: she is on Micardis 80/25 mg, no side effects, bp is at goal today, no chest pain, palpitation   Hyperlipidemia: takingCrestor daily now, we will recheck next visit, she has not been eating very healthy since she moved.   Asthma:shewas seeingDr. Felicie Morn and PFT, she was placed on Breo and seems to work well for her, she has SOB with activity that may be multifactorial, she hasnot used rescue inhaler recently, doing well on maintenance medication.No cough or wheezing.Symptoms stable, we switched from Breo to Advair back in Feb 2021 secondary to cost, but is doing well   Myasthenia Gravis: diagnosed at [facility] by Dr. Burnett Harry, 09/2015, seronegative, only causing ptosis and double vision, no other symptoms. She had a negative chest CT for thymoma, and has started on medication ( Mestinon ), she states it works quickly but wears out very fast. She takes before drivingto and until she gets home, takes 4 daily.Does not usually bother her to have double vision when at home.Sheis now [place] [facility], [facility] and had last visit 08/2019 and no  changes in therapy   Chronic Tension Headaches: seen by Dr. Marney Setting wastaking Topamax 100 mgbut stopped because of mental fogginess and is doing well even off medication. She denies any recent episodes    Abnormal CT thyroid: incidental finding of thyroid calcification of left lobe, she saw Endo and had negative biopsy., she had an US done one year ago, accidentally given levothyroxine by endo and caused TSH suppression,she is still under the care of Dr. Loanne Drilling, off medication , labs done in Feb was normal , had a follow up 12/2019 and is going to follow up in 2023   Bariatric Surgery/Obesity: she had bariatric surgery back in 2014 , her weight was almost 300 lbs, went down to close to 200lbs, butwasgradually gaining weight,she was started on Trulicity 56/4332 and RJJO84 lbs, butis gaining it back, shewas241 lbsJuly 2020 and we started contrave, she has lost 11 lbs on the medication ( 229 lbs ), she was out of medication for a while and gained some weight, we gave a new rx Feb 2021 she titrated dose up, but there a gap with PA and she had to re-start medication April  2021 ,weight went from  233 lbs to 229 .8 lbs, she has been eating out because she recently moved, explained she has not lost any weight and insurance will deny coverage   OSA: shehas a new machine, rx written by Dr. Felicie Morn and had a recent virtual follow up with him  Carpal Tunnel syndrome:had left carpal tunnel release surgery 05/2019 and had right side feb 2021  Dr David Stall  is now doing well.   SOB and diaphoresis: seeing cardiologist, last visit 03/2019 she saw PA Dunn, and on A/P he stated MRI would be indicated to rule out cardiac amyloidosisand it was negative, normal LV and EF, she still has episodes of getting sweaty likely from menopause. She states intermittent and seems to be improving   Patient Active Problem List   Diagnosis Date Noted  . Carpal tunnel syndrome, right upper limb  08/19/2019  . Carpal tunnel syndrome, left upper limb 06/08/2019  . Multinodular goiter 11/25/2018  . Essential hypertension 11/11/2018  . DOE (dyspnea on exertion) 04/15/2018  . Hyperlipidemia 12/09/2017  . Diaphoresis 11/26/2017  . Special screening for malignant neoplasms, colon   . Epiretinal membrane (ERM) of left eye 09/09/2016  . Glaucoma, left eye 09/05/2016  . Seronegative myasthenia gravis ([place]) 08/08/2015  . Psoriasis 07/06/2015  . Stress incontinence 07/06/2015  . Chronic tension-type headache, intractable 05/29/2015  . Anxiety and depression 04/12/2015  . Asthma, mild intermittent 04/12/2015  . Bladder polyp 04/12/2015  . Carpal tunnel syndrome 04/12/2015  . Binocular vision disorder with diplopia 04/12/2015  . Dyslipidemia 04/12/2015  . H/O: HTN (hypertension) 04/12/2015  . Hemorrhoids, internal 04/12/2015  . Neuropathy 04/12/2015  . Nuclear sclerotic cataract 04/12/2015  . Morbid obesity ([place]) 04/12/2015  . Obstructive apnea 04/12/2015  . Perennial allergic rhinitis 04/12/2015  . 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 Type 2 diabetes, controlled, with neuropathy ([place]) 04/12/2015  . Vitamin D deficiency 04/12/2015  . Cephalalgia 08/16/2014  . Lump or mass in breast 06/14/2013  . Bariatric surgery status 10/12/2012    Past Surgical History:  Procedure Laterality Date  . ABDOMINAL HYSTERECTOMY  1991  . [place] SURGERY  2014  . CARPAL TUNNEL RELEASE Left 06/08/2019   Procedure: LEFT CARPAL TUNNEL RELEASE;  Surgeon: Mcarthur Rossetti, MD;  Location: [place];  Service: Orthopedics;  Laterality: Left;  . CARPAL TUNNEL RELEASE Right 08/19/2019   Procedure: RIGHT CARPAL TUNNEL RELEASE;  Surgeon: Mcarthur Rossetti, MD;  Location: [place];  Service: Orthopedics;  Laterality: Right;  . CATARACT EXTRACTION W/PHACO Left 01/28/2017   Procedure: CATARACT EXTRACTION PHACO AND INTRAOCULAR LENS PLACEMENT  (IOC);  Surgeon: Birder Robson, MD;  Location: [facility];  Service: Ophthalmology;  Laterality: Left;  [place] 00:37 AP% 17.8 CDE 6.61 Fluid pack lot # [id number]  . COLONOSCOPY  2012  . COLONOSCOPY WITH PROPOFOL N/A 09/30/2016   Procedure: COLONOSCOPY WITH PROPOFOL;  Surgeon: Lucilla Lame, MD;  Location: [place];  Service: Endoscopy;  Laterality: N/A;  diabetic - diet controlled sleep apnea  . KNEE SURGERY Right 1979-1980  . OOPHORECTOMY    . PARS PLANA VITRECTOMY Left 03/27/2016   Procedure: PARS PLANA VITRECTOMY WITH 25 GAUGE AND MEMBRANE PEEL, INTERNAL LIMITING MEMBRANE;  Surgeon: Hurman Horn, MD;  Location: [place];  Service: Ophthalmology;  Laterality: Left;  . THYROID SURGERY     BX done was benign  . UPPER GI ENDOSCOPY    . VITRECTOMY Left 04/27/2016    Family History  Problem Relation Age of Onset  . Diabetes Mother   . Hypertension Mother   . Heart disease Mother 27       stent x 1   . Migraines Sister   . Stroke Maternal Grandfather   . Diabetes Paternal Grandfather   . Muscular dystrophy Sister   . Heart attack Maternal Aunt   . Breast cancer Neg Hx   . Thyroid disease Neg  Hx     Social History   Tobacco Use  . Smoking status: Former Smoker    Packs/day: 0.50    Years: 3.00    Pack years: 1.50    Start date: 06/24/1972    Quit date: 06/25/1975    Years since quitting: 44.7  . Smokeless tobacco: Never Used  . Tobacco comment: smoked as teenager  Substance Use Topics  . Alcohol use: No    Alcohol/week: 0.0 standard drinks     Current Outpatient Medications:  .  ACCU-CHEK GUIDE test strip, USE AS DIRECTED, Disp: 100 each, Rfl: 12 .  acetaminophen (TYLENOL) 500 MG tablet, Take 1,000 mg by mouth every 6 (six) hours as needed for mild pain. Reported on 07/06/2015, Disp: , Rfl:  .  brimonidine-timolol (COMBIGAN) 0.2-0.5 % ophthalmic solution, 1 drop daily. , Disp: , Rfl:  .  budesonide (RHINOCORT AQUA) 32 MCG/ACT nasal spray, Place 2 sprays into both  nostrils at bedtime., Disp: 15 mL, Rfl: 2 .  Coenzyme Q10 (CO Q 10) 100 MG CAPS, Take 1 capsule by mouth daily., Disp: 30 capsule, Rfl: 2 .  latanoprost (XALATAN) 0.005 % ophthalmic solution, Place 1 drop into the left eye at bedtime. , Disp: , Rfl:  .  loratadine (CLARITIN) 10 MG tablet, Take 1 tablet (10 mg total) by mouth 2 (two) times daily. (Patient taking differently: Take 10 mg by mouth daily. ), Disp: 60 tablet, Rfl: 0 .  Multiple Vitamins-Minerals (MULTIVITAMIN WITH MINERALS) tablet, Take 1 tablet by mouth daily., Disp: , Rfl:  .  Omega-3 Fatty Acids (FISH OIL) 1200 MG CAPS, Take 2 capsules by mouth daily., Disp: , Rfl:  .  pyridostigmine (MESTINON) 60 MG tablet, Take 1 tablet at 6:30a, 11a, and 3:30p.  OK to take extra dose as needed., Disp: 360 tablet, Rfl: 3 .  albuterol (VENTOLIN HFA) 108 (90 Base) MCG/ACT inhaler, Inhale 2 puffs into the lungs every 4 (four) hours as needed for wheezing or shortness of breath., Disp: 18 g, Rfl: 0 .  Dulaglutide (TRULICITY) 1.5 VV/6.1YW SOPN, Inject 1.5 mg into the skin once a week., Disp: 4 mL, Rfl: 2 .  DULoxetine (CYMBALTA) 60 MG capsule, Take 1 capsule (60 mg total) by mouth daily., Disp: 90 capsule, Rfl: 1 .  Fluticasone-Salmeterol (ADVAIR) 100-50 MCG/DOSE AEPB, Inhale 1 puff into the lungs 2 (two) times daily., Disp: 1 each, Rfl: 2 .  montelukast (SINGULAIR) 10 MG tablet, Take 1 tablet (10 mg total) by mouth at bedtime., Disp: 90 tablet, Rfl: 1 .  Naltrexone-buPROPion HCl ER (CONTRAVE) 8-90 MG TB12, Take 2 tablets by mouth 2 (two) times daily., Disp: 120 tablet, Rfl: 2 .  rosuvastatin (CRESTOR) 5 MG tablet, Take 1 tablet (5 mg total) by mouth daily., Disp: 90 tablet, Rfl: 1 .  telmisartan-hydrochlorothiazide (MICARDIS HCT) 80-25 MG tablet, Take 1 tablet by mouth daily., Disp: 90 tablet, Rfl: 1  Allergies  Allergen Reactions  . Aspirin Swelling    Upset stomach  Upset stomach   . Azithromycin     Contraindicated in Myasthenia   . Ace  Inhibitors     cough  . Amlodipine     Edema of feet and hands  . Gabapentin     anxiety  . Pregabalin Other (See Comments)    anxiety    I personally reviewed active problem list, medication list, allergies, family history, social history, health maintenance with the patient/caregiver today.   ROS  Constitutional: Negative for fever or weight change.  Respiratory: Negative  for cough and shortness of breath.   Cardiovascular: Negative for chest pain or palpitations.  Gastrointestinal: Negative for abdominal pain, no bowel changes.  Musculoskeletal: Negative for gait problem or joint swelling.  Skin: Negative for rash.  Neurological: Negative for dizziness or headache.  No other specific complaints in a complete review of systems (except as listed in HPI above).   Objective  Vitals:   03/29/20 1416  BP: 130/82  Pulse: 74  Resp: 16  Temp: 98 F (36.7 C)  SpO2: 99%  Weight: 229 lb 8 oz (104.1 kg)  Height: 5\' 2"  (1.575 m)    Body mass index is 41.98 kg/m.  Physical Exam  Constitutional: Patient appears well-developed and well-nourished. Obese  No distress.  HEENT: head atraumatic, normocephalic, pupils equal and reactive to light, , neck supple Cardiovascular: Normal rate, regular rhythm and normal heart sounds.  No murmur heard. No BLE edema. Pulmonary/Chest: Effort normal and breath sounds normal. No respiratory distress. Abdominal: Soft.  There is no tenderness. Psychiatric: Patient has a normal mood and affect. behavior is normal. Judgment and thought content normal. Muscular Skeletal: crepitus with extension of both knees  Diabetic Foot Exam: Diabetic Foot Exam - Simple   Simple Foot Form Diabetic Foot exam was performed with the following findings: Yes 03/29/2020  3:08 PM  Visual Inspection No deformities, no ulcerations, no other skin breakdown bilaterally: Yes Sensation Testing Intact to touch and monofilament testing bilaterally: Yes Pulse  Check Posterior Tibialis and Dorsalis pulse intact bilaterally: Yes Comments      PHQ2/9: Depression screen [facility] 2/9 03/29/2020 11/02/2019 08/13/2019 08/04/2019 05/05/2019  Decreased Interest 0 0 0 0 0  Down, Depressed, Hopeless 0 0 0 0 0  PHQ - 2 Score 0 0 0 0 0  Altered sleeping - 3 0 0 0  Tired, decreased energy - 1 1 1  0  Change in appetite - 0 1 1 0  Feeling bad or failure about yourself  - 0 0 0 0  Trouble concentrating - 0 0 0 0  Moving slowly or fidgety/restless - 0 0 0 0  Suicidal thoughts - 0 0 0 0  PHQ-9 Score - 4 2 2  0  Difficult doing work/chores - Somewhat difficult Not difficult at all Not difficult at all -  Some recent data might be hidden    phq 9 is negative   Fall Risk: Fall Risk  03/29/2020 11/02/2019 08/13/2019 08/04/2019 05/05/2019  Falls in the past year? 1 0 - 0 0  Number falls in past yr: 1 0 0 0 0  Injury with Fall? 1 0 0 0 0    Functional Status Survey: Is the patient deaf or have difficulty hearing?: No Does the patient have difficulty seeing, even when wearing glasses/contacts?: Yes Does the patient have difficulty concentrating, remembering, or making decisions?: Yes Does the patient have difficulty walking or climbing stairs?: Yes Does the patient have difficulty dressing or bathing?: No Does the patient have difficulty doing errands alone such as visiting a doctor's office or shopping?: No   Assessment & Plan  1. Need for shingles vaccine  - Varicella-zoster vaccine IM  2. Type 2 diabetes, controlled, with neuropathy (HCC)  - Dulaglutide (TRULICITY) 1.5 IO/9.6EX SOPN; Inject 1.5 mg into the skin once a week.  Dispense: 4 mL; Refill: 2 - DULoxetine (CYMBALTA) 60 MG capsule; Take 1 capsule (60 mg total) by mouth daily.  Dispense: 90 capsule; Refill: 1 - Microalbumin / creatinine urine ratio - Hemoglobin A1c  3.  Obstructive apnea  Compliant with CPAP   4. Dyslipidemia  - rosuvastatin (CRESTOR) 5 MG tablet; Take 1 tablet (5 mg total) by  mouth daily.  Dispense: 90 tablet; Refill: 1 - Lipid panel  5. Mild persistent asthma without complication  - Fluticasone-Salmeterol (ADVAIR) 100-50 MCG/DOSE AEPB; Inhale 1 puff into the lungs 2 (two) times daily.  Dispense: 1 each; Refill: 2 - albuterol (VENTOLIN HFA) 108 (90 Base) MCG/ACT inhaler; Inhale 2 puffs into the lungs every 4 (four) hours as needed for wheezing or shortness of breath.  Dispense: 18 g; Refill: 0  6. Perennial allergic rhinitis  - montelukast (SINGULAIR) 10 MG tablet; Take 1 tablet (10 mg total) by mouth at bedtime.  Dispense: 90 tablet; Refill: 1  7. Seronegative myasthenia gravis ([place])   8. Vitamin D deficiency  - VITAMIN D 25 Hydroxy (Vit-D Deficiency, Fractures)  9. Dyslipidemia associated with type 2 diabetes mellitus ([place])   10. Multiple thyroid nodules  Under the care of Dr. Loanne Drilling  11. Hypertension, benign  - COMPLETE METABOLIC PANEL WITH GFR - CBC with Differential/Platelet  12. Bariatric surgery status   13. Peripheral polyneuropathy  - DULoxetine (CYMBALTA) 60 MG capsule; Take 1 capsule (60 mg total) by mouth daily.  Dispense: 90 capsule; Refill: 1  14. Morbid obesity, unspecified obesity type ([place])  - Naltrexone-buPROPion HCl ER (CONTRAVE) 8-90 MG TB12; Take 2 tablets by mouth 2 (two) times daily.  Dispense: 120 tablet; Refill: 2  15. Encounter for screening for HIV  - HIV Antibody (routine testing w rflx)  16. Long-term use of high-risk medication  - Vitamin B12

## 2020-03-30 ENCOUNTER — Encounter: Payer: Self-pay | Admitting: Family Medicine

## 2020-05-01 ENCOUNTER — Other Ambulatory Visit: Payer: Self-pay | Admitting: Family Medicine

## 2020-06-27 ENCOUNTER — Other Ambulatory Visit: Payer: Self-pay | Admitting: Family Medicine

## 2020-06-27 DIAGNOSIS — J4530 Mild persistent asthma, uncomplicated: Secondary | ICD-10-CM

## 2020-06-30 ENCOUNTER — Other Ambulatory Visit: Payer: Self-pay

## 2020-06-30 ENCOUNTER — Other Ambulatory Visit
Admission: RE | Admit: 2020-06-30 | Discharge: 2020-06-30 | Disposition: A | Discharge status: Home / Self Care | Payer: 59 | Attending: Family Medicine | Admitting: Family Medicine

## 2020-06-30 DIAGNOSIS — Z114 Encounter for screening for human immunodeficiency virus [HIV]: Secondary | ICD-10-CM | POA: Insufficient documentation

## 2020-06-30 DIAGNOSIS — I10 Essential (primary) hypertension: Secondary | ICD-10-CM | POA: Insufficient documentation

## 2020-06-30 DIAGNOSIS — E559 Vitamin D deficiency, unspecified: Secondary | ICD-10-CM | POA: Diagnosis not present

## 2020-06-30 DIAGNOSIS — Z79899 Other long term (current) drug therapy: Secondary | ICD-10-CM | POA: Insufficient documentation

## 2020-06-30 DIAGNOSIS — E785 Hyperlipidemia, unspecified: Secondary | ICD-10-CM | POA: Insufficient documentation

## 2020-06-30 DIAGNOSIS — E1140 Type 2 diabetes mellitus with diabetic neuropathy, unspecified: Secondary | ICD-10-CM | POA: Diagnosis not present

## 2020-06-30 LAB — CBC WITH DIFFERENTIAL/PLATELET
Abs Immature Granulocytes: 0.04 10*3/uL (ref 0.00–0.07)
Basophils Absolute: 0.1 10*3/uL (ref 0.0–0.1)
Basophils Relative: 1 %
Eosinophils Absolute: 0.1 10*3/uL (ref 0.0–0.5)
Eosinophils Relative: 1 %
HCT: 38.2 % (ref 36.0–46.0)
Hemoglobin: 12.6 g/dL (ref 12.0–15.0)
Immature Granulocytes: 0 %
Lymphocytes Relative: 38 %
Lymphs Abs: 3.7 10*3/uL (ref 0.7–4.0)
MCH: 28.1 pg (ref 26.0–34.0)
MCHC: 33 g/dL (ref 30.0–36.0)
MCV: 85.1 fL (ref 80.0–100.0)
Monocytes Absolute: 0.8 10*3/uL (ref 0.1–1.0)
Monocytes Relative: 8 %
Neutro Abs: 5.2 10*3/uL (ref 1.7–7.7)
Neutrophils Relative %: 52 %
Platelets: 321 10*3/uL (ref 150–400)
RBC: 4.49 MIL/uL (ref 3.87–5.11)
RDW: 13 % (ref 11.5–15.5)
WBC: 9.9 10*3/uL (ref 4.0–10.5)
nRBC: 0 % (ref 0.0–0.2)

## 2020-06-30 LAB — COMPREHENSIVE METABOLIC PANEL
ALT: 22 U/L (ref 0–44)
AST: 22 U/L (ref 15–41)
Albumin: 3.8 g/dL (ref 3.5–5.0)
Alkaline Phosphatase: 42 U/L (ref 38–126)
Anion gap: 13 (ref 5–15)
BUN: 15 mg/dL (ref 8–23)
CO2: 25 mmol/L (ref 22–32)
Calcium: 8.7 mg/dL — ABNORMAL LOW (ref 8.9–10.3)
Chloride: 98 mmol/L (ref 98–111)
Creatinine, Ser: 0.55 mg/dL (ref 0.44–1.00)
GFR, Estimated: >60 mL/min (ref >60)
Glucose, Bld: 96 mg/dL (ref 70–99)
Potassium: 3.7 mmol/L (ref 3.5–5.1)
Sodium: 136 mmol/L (ref 135–145)
Total Bilirubin: 0.6 mg/dL (ref 0.3–1.2)
Total Protein: 7.3 g/dL (ref 6.5–8.1)

## 2020-06-30 LAB — LIPID PANEL
Cholesterol: 205 mg/dL — ABNORMAL HIGH (ref 0–200)
HDL: 64 mg/dL (ref >40)
LDL Cholesterol: 117 mg/dL — ABNORMAL HIGH (ref 0–99)
Total CHOL/HDL Ratio: 3.2 RATIO
Triglycerides: 121 mg/dL (ref <150)
VLDL: 24 mg/dL (ref 0–40)

## 2020-06-30 LAB — VITAMIN B12: Vitamin B-12: 220 pg/mL (ref 180–914)

## 2020-06-30 LAB — HIV ANTIBODY (ROUTINE TESTING W REFLEX): HIV Screen 4th Generation wRfx: NONREACTIVE

## 2020-06-30 LAB — VITAMIN D 25 HYDROXY (VIT D DEFICIENCY, FRACTURES): Vit D, 25-Hydroxy: 24.91 ng/mL — ABNORMAL LOW (ref 30–100)

## 2020-07-01 LAB — MICROALBUMIN / CREATININE URINE RATIO
Creatinine, Urine: 25.3 mg/dL
Microalb Creat Ratio: <12 mg/g creat (ref 0–29)
Microalb, Ur: <3 ug/mL — ABNORMAL HIGH

## 2020-07-31 ENCOUNTER — Ambulatory Visit (INDEPENDENT_AMBULATORY_CARE_PROVIDER_SITE_OTHER): Payer: Medicare Other | Admitting: Family Medicine

## 2020-07-31 ENCOUNTER — Other Ambulatory Visit: Payer: Self-pay

## 2020-07-31 ENCOUNTER — Encounter: Payer: Self-pay | Admitting: Family Medicine

## 2020-07-31 VITALS — BP 128/80 | HR 72 | Temp 98.2°F | Resp 16 | Ht 62.0 in | Wt 237.5 lb

## 2020-07-31 DIAGNOSIS — E2839 Other primary ovarian failure: Secondary | ICD-10-CM

## 2020-07-31 DIAGNOSIS — Z23 Encounter for immunization: Secondary | ICD-10-CM | POA: Diagnosis not present

## 2020-07-31 DIAGNOSIS — Z1231 Encounter for screening mammogram for malignant neoplasm of breast: Secondary | ICD-10-CM

## 2020-07-31 DIAGNOSIS — Z0000 Encounter for general adult medical examination without abnormal findings: Secondary | ICD-10-CM

## 2020-07-31 NOTE — Patient Instructions (Signed)
B12 sublingual 1000 mcg daily   Preventive Care 66 Years and Older, Female Preventive care refers to lifestyle choices and visits with your health care provider that can promote health and wellness. This includes:  A yearly physical exam. This is also called an annual wellness visit.  Regular dental and eye exams.  Immunizations.  Screening for certain conditions.  Healthy lifestyle choices, such as: ? Eating a healthy diet. ? Getting regular exercise. ? Not using drugs or products that contain nicotine and tobacco. ? Limiting alcohol use. What can I expect for my preventive care visit? Physical exam Your health care provider will check your:  Height and weight. These may be used to calculate your BMI (body mass index). BMI is a measurement that tells if you are at a healthy weight.  Heart rate and blood pressure.  Body temperature.  Skin for abnormal spots. Counseling Your health care provider may ask you questions about your:  Past medical problems.  Family's medical history.  Alcohol, tobacco, and drug use.  Emotional well-being.  Home life and relationship well-being.  Sexual activity.  Diet, exercise, and sleep habits.  History of falls.  Memory and ability to understand (cognition).  Work and work Astronomer.  Pregnancy and menstrual history.  Access to firearms. What immunizations do I need? Vaccines are usually given at various ages, according to a schedule. Your health care provider will recommend vaccines for you based on your age, medical history, and lifestyle or other factors, such as travel or where you work.   What tests do I need? Blood tests  Lipid and cholesterol levels. These may be checked every 5 years, or more often depending on your overall health.  Hepatitis C test.  Hepatitis B test. Screening  Lung cancer screening. You may have this screening every year starting at age 55 if you have a 30-pack-year history of smoking and  currently smoke or have quit within the past 15 years.  Colorectal cancer screening. ? All adults should have this screening starting at age 11 and continuing until age 6. ? Your health care provider may recommend screening at age 49 if you are at increased risk. ? You will have tests every 1-10 years, depending on your results and the type of screening test.  Diabetes screening. ? This is done by checking your blood sugar (glucose) after you have not eaten for a while (fasting). ? You may have this done every 1-3 years.  Mammogram. ? This may be done every 1-2 years. ? Talk with your health care provider about how often you should have regular mammograms.  Abdominal aortic aneurysm (AAA) screening. You may need this if you are a current or former smoker.  BRCA-related cancer screening. This may be done if you have a family history of breast, ovarian, tubal, or peritoneal cancers. Other tests  STD (sexually transmitted disease) testing, if you are at risk.  Bone density scan. This is done to screen for osteoporosis. You may have this done starting at age 51. Talk with your health care provider about your test results, treatment options, and if necessary, the need for more tests. Follow these instructions at home: Eating and drinking  Eat a diet that includes fresh fruits and vegetables, whole grains, lean protein, and low-fat dairy products. Limit your intake of foods with high amounts of sugar, saturated fats, and salt.  Take vitamin and mineral supplements as recommended by your health care provider.  Do not drink alcohol if your health care  provider tells you not to drink.  If you drink alcohol: ? Limit how much you have to 0-1 drink a day. ? Be aware of how much alcohol is in your drink. In the [place] one drink equals one 12 oz bottle of beer (355 mL), one 5 oz glass of wine (148 mL), or one 1 oz glass of hard liquor (44 mL).   Lifestyle  Take daily care of your teeth and  gums. Brush your teeth every morning and night with fluoride toothpaste. Floss one time each day.  Stay active. Exercise for at least 30 minutes 5 or more days each week.  Do not use any products that contain nicotine or tobacco, such as cigarettes, e-cigarettes, and chewing tobacco. If you need help quitting, ask your health care provider.  Do not use drugs.  If you are sexually active, practice safe sex. Use a condom or other form of protection in order to prevent STIs (sexually transmitted infections).  Talk with your health care provider about taking a low-dose aspirin or statin.  Find healthy ways to cope with stress, such as: ? Meditation, yoga, or listening to music. ? Journaling. ? Talking to a trusted person. ? Spending time with friends and family. Safety  Always wear your seat belt while driving or riding in a vehicle.  Do not drive: ? If you have been drinking alcohol. Do not ride with someone who has been drinking. ? When you are tired or distracted. ? While texting.  Wear a helmet and other protective equipment during sports activities.  If you have firearms in your house, make sure you follow all gun safety procedures. What's next?  Visit your health care provider once a year for an annual wellness visit.  Ask your health care provider how often you should have your eyes and teeth checked.  Stay up to date on all vaccines. This information is not intended to replace advice given to you by your health care provider. Make sure you discuss any questions you have with your health care provider. Document Revised: 05/31/2020 Document Reviewed: 06/04/2018 [employer] Patient Education  2021 [employer].

## 2020-07-31 NOTE — Progress Notes (Signed)
Patient: Jasmine Woods, Female    DOB: January 23, 1956, 66 y.o.   MRN: [id number]  Visit Date: 07/31/2020  Today's Provider: Loistine Chance, MD   Welcome to Medicare   Subjective:    HPI   Jasmine Woods is a 65 y.o. female who presents today for her Welcome to [facility]   Patient/Caregiver input:  No problems  Review of Systems  Constitutional: Negative for fever, positive for  weight change.  Respiratory: Negative for cough and shortness of breath.   Cardiovascular: Negative for chest pain or palpitations.  Gastrointestinal: Negative for abdominal pain, no bowel changes.  Musculoskeletal: Negative for gait problem or joint swelling.  Skin: Negative for rash.  Neurological: Negative for dizziness or headache.  No other specific complaints in a complete review of systems (except as listed in HPI above).  Past Medical History:  Diagnosis Date  . Allergy   . Anxiety   . Asthma   . Bladder polyp    Dr. Ernst Spell  . Carpal tunnel syndrome   . Complication of anesthesia    needed nebulizer after surgery  . Depression   . Diabetes mellitus without complication ([place])    Diet controlled  . Double vision    seen by Dr. 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 Dyslipidemia   . Frequency of urination   . Glaucoma, left eye   . H/O myasthenia gravis    MOSTLY AFFECTS EYES  . HA (headache)   . Heart murmur    followed by PCP  . Hx of bariatric surgery   . Hypertension    no meds  . Internal hemorrhoids   . Myasthenia gravis ([place])    "mostly effects eyes"  . Myasthenia gravis ([place])   . Neuropathy, generalized    diabetic on feet  . Nuclear sclerotic cataract of both eyes   . Obesity (BMI 30-39.9)   . Obstructive sleep apnea on CPAP    uses cpap  . Patient is Jehovah's Witness    no blood products  . Rotator cuff tendonitis 07/2009   also has impingment right shoulder pain, seen by Dr. Mack Guise  . Sleep apnea   . Thyroid nodule   . Vaginal  dryness, menopausal   . Vitamin D deficiency     Past Surgical History:  Procedure Laterality Date  . ABDOMINAL HYSTERECTOMY  1991  . [place] SURGERY  2014  . CARPAL TUNNEL RELEASE Left 06/08/2019   Procedure: LEFT CARPAL TUNNEL RELEASE;  Surgeon: Mcarthur Rossetti, MD;  Location: [place];  Service: Orthopedics;  Laterality: Left;  . CARPAL TUNNEL RELEASE Right 08/19/2019   Procedure: RIGHT CARPAL TUNNEL RELEASE;  Surgeon: Mcarthur Rossetti, MD;  Location: [place];  Service: Orthopedics;  Laterality: Right;  . CATARACT EXTRACTION W/PHACO Left 01/28/2017   Procedure: CATARACT EXTRACTION PHACO AND INTRAOCULAR LENS PLACEMENT (IOC);  Surgeon: Birder Robson, MD;  Location: [facility];  Service: Ophthalmology;  Laterality: Left;  [place] 00:37 AP% 17.8 CDE 6.61 Fluid pack lot # [id number]  . COLONOSCOPY  2012  . COLONOSCOPY WITH PROPOFOL N/A 09/30/2016   Procedure: COLONOSCOPY WITH PROPOFOL;  Surgeon: Lucilla Lame, MD;  Location: [place];  Service: Endoscopy;  Laterality: N/A;  diabetic - diet controlled sleep apnea  . KNEE SURGERY Right 1979-1980  . OOPHORECTOMY    . PARS PLANA VITRECTOMY Left 03/27/2016   Procedure: PARS PLANA VITRECTOMY WITH 25 GAUGE AND MEMBRANE PEEL, INTERNAL LIMITING MEMBRANE;  Surgeon: Hurman Horn, MD;  Location: [place] [facility];  Service: Ophthalmology;  Laterality: Left;  . THYROID SURGERY     BX done was benign  . UPPER GI ENDOSCOPY    . VITRECTOMY Left 04/27/2016    Family History  Problem Relation Age of Onset  . Diabetes Mother   . Hypertension Mother   . Heart disease Mother 40       stent x 1   . Migraines Sister   . Stroke Maternal Grandfather   . Diabetes Paternal Grandfather   . Muscular dystrophy Sister   . Heart attack Maternal Aunt   . Breast cancer Neg Hx   . Thyroid disease Neg Hx     Social History   Socioeconomic History  . Marital status: Married    Spouse name: Gwyndolyn Saxon "Butch"  . Number  of children: 2  . Years of education: 39  . Highest education level: Associate degree: occupational, Hotel manager, or vocational program  Occupational History  . Occupation: [place] [place]  Tobacco Use  . Smoking status: Former Smoker    Packs/day: 0.50    Years: 3.00    Pack years: 1.50    Start date: 06/24/1972    Quit date: 06/25/1975    Years since quitting: 45.1  . Smokeless tobacco: Never Used  . Tobacco comment: smoked as teenager  Vaping Use  . Vaping Use: Never used  Substance and Sexual Activity  . Alcohol use: No    Alcohol/week: 0.0 standard drinks  . Drug use: No  . Sexual activity: Not Currently    Partners: Male  Other Topics Concern  . Not on file  Social History Narrative   Right handed   One story home   12 years education   Clerical workers [employer]    Lives with husband   Social Determinants of Health   Financial Resource Strain: Low Risk   . Difficulty of Paying Living Expenses: Not hard at all  Food Insecurity: No Food Insecurity  . Worried About Charity fundraiser in the Last Year: Never true  . Ran Out of Food in the Last Year: Never true  Transportation Needs: No Transportation Needs  . Lack of Transportation (Medical): No  . Lack of Transportation (Non-Medical): No  Physical Activity: Inactive  . Days of Exercise per Week: 0 days  . Minutes of Exercise per Session: 0 min  Stress: No Stress Concern Present  . Feeling of Stress : Not at all  Social Connections: Socially Integrated  . Frequency of Communication with Friends and Family: More than three times a week  . Frequency of Social Gatherings with Friends and Family: More than three times a week  . Attends Religious Services: More than 4 times per year  . Active Member of Clubs or Organizations: Yes  . Attends Archivist Meetings: More than 4 times per year  . Marital Status: Married  Human resources officer Violence: Not At Risk  . Fear of Current or Ex-Partner: No  . Emotionally Abused: No   . Physically Abused: No  . Sexually Abused: No    Outpatient Encounter Medications as of 07/31/2020  Medication Sig  . ACCU-CHEK GUIDE test strip USE AS DIRECTED  . acetaminophen (TYLENOL) 500 MG tablet Take 1,000 mg by mouth every 6 (six) hours as needed for mild pain. Reported on 07/06/2015  . albuterol (VENTOLIN HFA) 108 (90 Base) MCG/ACT inhaler INHALE 2 PUFFS BY MOUTH EVERY 4 HOURS AS NEEDED FOR WHEEZING OR SHORTNESS OF BREATH.  . brimonidine-timolol (COMBIGAN) 0.2-0.5 %  ophthalmic solution 1 drop daily.   . budesonide (RHINOCORT AQUA) 32 MCG/ACT nasal spray Place 2 sprays into both nostrils at bedtime.  . Coenzyme Q10 (CO Q 10) 100 MG CAPS Take 1 capsule by mouth daily.  . Dulaglutide (TRULICITY) 1.5 MG/0.5ML SOPN Inject 1.5 mg into the skin once a week.  . DULoxetine (CYMBALTA) 60 MG capsule Take 1 capsule (60 mg total) by mouth daily.  . Lancets (FREESTYLE) lancets 1 each 3 (three) times daily.  Marland Kitchen. latanoprost (XALATAN) 0.005 % ophthalmic solution Place 1 drop into the left eye at bedtime.   . montelukast (SINGULAIR) 10 MG tablet Take 1 tablet (10 mg total) by mouth at bedtime.  . Multiple Vitamins-Minerals (MULTIVITAMIN WITH MINERALS) tablet Take 1 tablet by mouth daily.  . Omega-3 Fatty Acids (FISH OIL) 1200 MG CAPS Take 2 capsules by mouth daily.  Marland Kitchen. pyridostigmine (MESTINON) 60 MG tablet Take 1 tablet at 6:30a, 11a, and 3:30p.  OK to take extra dose as needed.  . rosuvastatin (CRESTOR) 5 MG tablet Take 1 tablet (5 mg total) by mouth daily.  Marland Kitchen. telmisartan-hydrochlorothiazide (MICARDIS HCT) 80-25 MG tablet Take 1 tablet by mouth daily.  Marland Kitchen. loratadine (CLARITIN) 10 MG tablet Take 1 tablet (10 mg total) by mouth 2 (two) times daily. (Patient not taking: Reported on 07/31/2020)  . [DISCONTINUED] Fluticasone-Salmeterol (ADVAIR) 100-50 MCG/DOSE AEPB Inhale 1 puff into the lungs 2 (two) times daily.  . [DISCONTINUED] Naltrexone-buPROPion HCl ER (CONTRAVE) 8-90 MG TB12 Take 2 tablets by mouth 2  (two) times daily.   No facility-administered encounter medications on file as of 07/31/2020.    Allergies  Allergen Reactions  . Aspirin Swelling    Upset stomach  Upset stomach   . Azithromycin     Contraindicated in Myasthenia   . Ace Inhibitors     cough  . Amlodipine     Edema of feet and hands  . Gabapentin     anxiety  . Pregabalin Other (See Comments)    anxiety    Care Team Updated in EHR: Yes  Last Vision Exam: 04/2020 Wears corrective lenses: Yes Last Dental Exam: 01/2020  Last Hearing Exam:  Wears Hearing Aids: No, haring scree today   Functional Ability / Safety Screening 1.  Was the timed Get Up and Go test shorter than 30 seconds?  yes 2.  Does the patient need help with the phone, transportation, shopping,      preparing meals, housework, laundry, medications, or managing money?  yes 3.  Is the patient's home free of loose throw rugs in walkways, pet beds, electrical cords, etc?   yes      Grab bars in the bathroom? yes      Handrails on the stairs?   N/A      Adequate lighting?   yes 4.  Has the patient noticed any hearing difficulties?   no  Diet Recall and Exercise Regimen: not exercising but will resume it , eat a variety of food, not eating out frequently   Advanced Care Planning: A voluntary discussion about advance care planning including the explanation and discussion of advance directives.  Discussed health care proxy and Living will, and the patient was able to identify a health care proxy as husband .  Patient does have a living will at present time. If patient does have living will, I have requested they bring this to the clinic to be scanned in to their chart. Does patient have a HCPOA?    yes If  yes, name and contact information: husband  Does patient have a living will or MOST form?  yes - she is not sure but will try to renew it   Cancer Screenings: Skin: discussed atypical lesions  Lung:  Low Dose CT Chest recommended if Age 83-80 years,  30 pack-year currently smoking OR have quit w/in 15years. Patient does not qualify. Breast:  Up to date on Mammogram? Yes/12/14/2018  Up to date of Bone Density/Dexa? No/Ordered at today's visit.  Colon: 09/30/2016 repeat in 10 years  Additional Screenings:  Hepatitis B/HIV/Syphillis:N/A Hepatitis C Screening: up to date Intimate Partner Violence: safe, negative screen   Objective:   Vitals: BP 128/80   Pulse 72   Temp 98.2 F (36.8 C) (Oral)   Resp 16   Ht 5\' 2"  (1.575 m)   Wt 237 lb 8 oz (107.7 kg)   SpO2 99%   BMI 43.44 kg/m  Body mass index is 43.44 kg/m.   Hearing Screening   125Hz  250Hz  500Hz  1000Hz  2000Hz  3000Hz  4000Hz  6000Hz  8000Hz   Right ear:   Pass Pass Pass  Pass    Left ear:   Pass Pass Pass  Pass      Visual Acuity Screening   Right eye Left eye Both eyes  Without correction: 20/100 20/100 20/100  With correction: 20/30 20/40 20/30     Physical Exam   Constitutional: Patient appears well-developed and well-nourished. Obese  No distress.  HEENT: head atraumatic, normocephalic, pupils equal and reactive to light, neck supple Cardiovascular: Normal rate, regular rhythm and normal heart sounds.  No murmur heard. No BLE edema. Pulmonary/Chest: Effort normal and breath sounds normal. No respiratory distress. Abdominal: Soft.  There is no tenderness. Psychiatric: Patient has a normal mood and affect. behavior is normal. Judgment and thought content normal.  Cognitive Testing - 6-CIT  Correct? Score   What year is it? yes 0 Yes = 0    No = 4  What month is it? yes 0 Yes = 0    No = 3  Remember:     Pia Mau, [place], [place]     What time is it? yes 0 Yes = 0    No = 3  Count backwards from 20 to 1 yes 0 Correct = 0    1 error = 2   More than 1 error = 4  Say the months of the year in reverse. yes 0 Correct = 0    1 error = 2   More than 1 error = 4  What address did I ask you to remember? yes 0 Correct = 0  1 error = 2    2 error = 4    3 error = 6     4 error = 8    All wrong = 10       TOTAL SCORE  0/28   Interpretation:  Normal  Normal (0-7) Abnormal (8-28)   Fall Risk: Fall Risk  07/31/2020 03/29/2020 11/02/2019 08/13/2019 08/04/2019  Falls in the past year? 1 1 0 - 0  Number falls in past yr: 1 1 0 0 0  Injury with Fall? 1 1 0 0 0  Risk for fall due to : History of fall(s) - - - -  Follow up Education provided - - - -   While moving furniture  Depression Screen Depression screen [facility] 2/9 07/31/2020 03/29/2020 11/02/2019 08/13/2019 08/04/2019  Decreased Interest 0 0 0 0 0  Down, Depressed, Hopeless 0  0 0 0 0  PHQ - 2 Score 0 0 0 0 0  Altered sleeping - - 3 0 0  Tired, decreased energy - - 1 1 1   Change in appetite - - 0 1 1  Feeling bad or failure about yourself  - - 0 0 0  Trouble concentrating - - 0 0 0  Moving slowly or fidgety/restless - - 0 0 0  Suicidal thoughts - - 0 0 0  PHQ-9 Score - - 4 2 2   Difficult doing work/chores - - Somewhat difficult Not difficult at all Not difficult at all  Some recent data might be hidden    Recent Results (from the past 2160 hour(s))  VITAMIN D 25 Hydroxy (Vit-D Deficiency, Fractures)     Status: Abnormal   Collection Time: 06/30/20  9:09 AM  Result Value Ref Range   Vit D, 25-Hydroxy 24.91 (L) 30 - 100 ng/mL    Comment: (NOTE) Vitamin D deficiency has been defined by the Institute of Medicine  and an Endocrine Society practice guideline as a level of serum 25-OH  vitamin D less than 20 ng/mL (1,2). The Endocrine Society went on to  further define vitamin D insufficiency as a level between 21 and 29  ng/mL (2).  1. IOM (Institute of Medicine). 2010. Dietary reference intakes for  calcium and D. [place]: The [employer]. 2. Holick MF, Binkley [place], Bischoff-Ferrari HA, et al. Evaluation,  treatment, and prevention of vitamin D deficiency: an Endocrine  Society clinical practice guideline, JCEM. 2011 Jul; 96(7): 1911-30.  Performed at [facility], [place]  [address] [place], [place] [address]   Vitamin B12     Status: None   Collection Time: 06/30/20  9:09 AM  Result Value Ref Range   Vitamin B-12 220 180 - 914 pg/mL    Comment: (NOTE) This assay is not validated for testing neonatal or myeloproliferative syndrome specimens for Vitamin B12 levels. Performed at [facility], [place] [address] [place], [place] [address]   CBC with Differential/Platelet     Status: None   Collection Time: 06/30/20  9:09 AM  Result Value Ref Range   WBC 9.9 4.0 - 10.5 K/uL   RBC 4.49 3.87 - 5.11 MIL/uL   Hemoglobin 12.6 12.0 - 15.0 g/dL   HCT 38.2 36.0 - 46.0 %   MCV 85.1 80.0 - 100.0 fL   MCH 28.1 26.0 - 34.0 pg   MCHC 33.0 30.0 - 36.0 g/dL   RDW 13.0 11.5 - 15.5 %   Platelets 321 150 - 400 K/uL   nRBC 0.0 0.0 - 0.2 %   Neutrophils Relative % 52 %   Neutro Abs 5.2 1.7 - 7.7 K/uL   Lymphocytes Relative 38 %   Lymphs Abs 3.7 0.7 - 4.0 K/uL   Monocytes Relative 8 %   Monocytes Absolute 0.8 0.1 - 1.0 K/uL   Eosinophils Relative 1 %   Eosinophils Absolute 0.1 0.0 - 0.5 K/uL   Basophils Relative 1 %   Basophils Absolute 0.1 0.0 - 0.1 K/uL   Immature Granulocytes 0 %   Abs Immature Granulocytes 0.04 0.00 - 0.07 K/uL    Comment: Performed at [facility], [address] [place], [place] [address]  Comprehensive metabolic panel     Status: Abnormal   Collection Time: 06/30/20  9:09 AM  Result Value Ref Range   Sodium 136 135 - 145 mmol/L   Potassium 3.7 3.5 - 5.1  mmol/L   Chloride 98 98 - 111 mmol/L   CO2 25 22 - 32 mmol/L   Glucose, Bld 96 70 - 99 mg/dL    Comment: Glucose reference range applies only to samples taken after fasting for at least 8 hours.   BUN 15 8 - 23 mg/dL   Creatinine, Ser 0.55 0.44 - 1.00 mg/dL   Calcium 8.7 (L) 8.9 - 10.3 mg/dL   Total Protein 7.3 6.5 - 8.1 g/dL   Albumin 3.8 3.5 - 5.0 g/dL   AST 22 15 - 41 U/L   ALT 22 0 - 44 U/L   Alkaline Phosphatase 42 38 - 126 U/L   Total Bilirubin 0.6 0.3 - 1.2 mg/dL    GFR, Estimated >60 >60 mL/min    Comment: (NOTE) Calculated using the CKD-EPI Creatinine Equation (2021)    Anion gap 13 5 - 15    Comment: Performed at [facility], [address] [place] [address] [place], [place] [address]  Lipid panel     Status: Abnormal   Collection Time: 06/30/20  9:09 AM  Result Value Ref Range   Cholesterol 205 (H) 0 - 200 mg/dL   Triglycerides 121 <150 mg/dL   HDL 64 >40 mg/dL   Total CHOL/HDL Ratio 3.2 RATIO   VLDL 24 0 - 40 mg/dL   LDL Cholesterol 117 (H) 0 - 99 mg/dL    Comment:        Total Cholesterol/HDL:CHD Risk Coronary Heart Disease Risk Table                     Men   Women  1/2 Average Risk   3.4   3.3  Average Risk       5.0   4.4  2 X Average Risk   9.6   7.1  3 X Average Risk  23.4   11.0        Use the calculated Patient Ratio above and the CHD Risk Table to determine the patient's CHD Risk.        ATP III CLASSIFICATION (LDL):  <100     mg/dL   Optimal  100-129  mg/dL   Near or Above                    Optimal  130-159  mg/dL   Borderline  160-189  mg/dL   High  >190     mg/dL   Very High Performed at [facility], [place], [place] [address]   HIV Antibody (routine testing w rflx)     Status: None   Collection Time: 06/30/20  9:09 AM  Result Value Ref Range   HIV Screen 4th Generation wRfx Non Reactive Non Reactive    Comment: Performed at [facility], [place] [address], [place], [place] [address]  Microalbumin / creatinine urine ratio     Status: Abnormal   Collection Time: 06/30/20  9:09 AM  Result Value Ref Range   Microalb, Ur <3.0 (H) Not Estab. ug/mL   Microalb Creat Ratio <12 0 - 29 mg/g creat    Comment: (NOTE)                       Normal:                0 -  29  Moderately increased: 30 - 300                       Severely increased:       >300 Performed At: [facility] [place], [place] [id number] Rush Farmer MD [id number]     Creatinine, Urine 25.3 Not Estab. mg/dL    Assessment & Plan:    1. Welcome to Medicare preventive visit  - DG Bone Density; Future  2. Ovarian failure  - DG Bone Density; Future  3. Encounter for screening mammogram for breast cancer  - MM 3D SCREEN BREAST BILATERAL; Future  4. Need for vaccination for pneumococcus  - Pneumococcal conjugate vaccine 13-valent IM  Exercise Activities and Dietary recommendations  Goals: she will do 30 minutes of physical activity daily  - Discussed health benefits of physical activity, and encouraged her to engage in regular exercise appropriate for her age and condition.   Immunization History  Administered Date(s) Administered  . Influenza, Quadrivalent, Recombinant, Inj, Pf 03/24/2019  . Influenza,inj,Quad PF,6+ Mos 03/22/2020  . Influenza-Unspecified 04/06/2015, 04/04/2016, 04/03/2017, 03/18/2018  . PFIZER(Purple Top)SARS-COV-2 Vaccination 07/09/2019, 07/30/2019  . Pneumococcal Conjugate-13 07/06/2015  . Pneumococcal Polysaccharide-23 12/19/2011, 03/18/2017  . Tdap 10/24/2005, 12/15/2015  . Zoster 03/25/2011  . Zoster Recombinat (Shingrix) 11/03/2019, 03/29/2020    Health Maintenance  Topic Date Due  . COVID-19 Vaccine (3 - Booster for Pfizer series) 01/27/2020  . DEXA SCAN  04/11/2020  . HEMOGLOBIN A1C  05/04/2020  . OPHTHALMOLOGY EXAM  08/29/2020 (Originally 11/11/2019)  . MAMMOGRAM  12/13/2020  . FOOT EXAM  03/29/2021  . PNA vac Low Risk Adult (2 of 2 - PPSV23) 03/18/2022  . TETANUS/TDAP  12/14/2025  . COLONOSCOPY (Pts 45-53yrs Insurance coverage will need to be confirmed)  10/01/2026  . INFLUENZA VACCINE  Completed  . Hepatitis C Screening  Completed  . HIV Screening  Completed    No orders of the defined types were placed in this encounter.   Current Outpatient Medications:  .  ACCU-CHEK GUIDE test strip, USE AS DIRECTED, Disp: 100 strip, Rfl: 11 .  acetaminophen (TYLENOL) 500 MG tablet, Take 1,000 mg by mouth every  6 (six) hours as needed for mild pain. Reported on 07/06/2015, Disp: , Rfl:  .  albuterol (VENTOLIN HFA) 108 (90 Base) MCG/ACT inhaler, INHALE 2 PUFFS BY MOUTH EVERY 4 HOURS AS NEEDED FOR WHEEZING OR SHORTNESS OF BREATH., Disp: 18 g, Rfl: 0 .  brimonidine-timolol (COMBIGAN) 0.2-0.5 % ophthalmic solution, 1 drop daily. , Disp: , Rfl:  .  budesonide (RHINOCORT AQUA) 32 MCG/ACT nasal spray, Place 2 sprays into both nostrils at bedtime., Disp: 15 mL, Rfl: 2 .  Coenzyme Q10 (CO Q 10) 100 MG CAPS, Take 1 capsule by mouth daily., Disp: 30 capsule, Rfl: 2 .  Dulaglutide (TRULICITY) 1.5 0000000 SOPN, Inject 1.5 mg into the skin once a week., Disp: 4 mL, Rfl: 2 .  DULoxetine (CYMBALTA) 60 MG capsule, Take 1 capsule (60 mg total) by mouth daily., Disp: 90 capsule, Rfl: 1 .  Lancets (FREESTYLE) lancets, 1 each 3 (three) times daily., Disp: , Rfl:  .  latanoprost (XALATAN) 0.005 % ophthalmic solution, Place 1 drop into the left eye at bedtime. , Disp: , Rfl:  .  montelukast (SINGULAIR) 10 MG tablet, Take 1 tablet (10 mg total) by mouth at bedtime., Disp: 90 tablet, Rfl: 1 .  Multiple Vitamins-Minerals (MULTIVITAMIN WITH MINERALS) tablet, Take 1 tablet by mouth daily., Disp: , Rfl:  .  Omega-3 Fatty Acids (FISH OIL) 1200 MG CAPS, Take 2 capsules by mouth daily., Disp: , Rfl:  .  pyridostigmine (MESTINON) 60 MG tablet, Take 1 tablet at 6:30a, 11a, and 3:30p.  OK to take extra dose as needed., Disp: 360 tablet, Rfl: 3 .  rosuvastatin (CRESTOR) 5 MG tablet, Take 1 tablet (5 mg total) by mouth daily., Disp: 90 tablet, Rfl: 1 .  telmisartan-hydrochlorothiazide (MICARDIS HCT) 80-25 MG tablet, Take 1 tablet by mouth daily., Disp: 90 tablet, Rfl: 1 .  loratadine (CLARITIN) 10 MG tablet, Take 1 tablet (10 mg total) by mouth 2 (two) times daily. (Patient not taking: Reported on 07/31/2020), Disp: 60 tablet, Rfl: 0 Medications Discontinued During This Encounter  Medication Reason  . Fluticasone-Salmeterol (ADVAIR) 100-50  MCG/DOSE AEPB   . Naltrexone-buPROPion HCl ER (CONTRAVE) 8-90 MG TB12     I have personally reviewed and addressed the Medicare Annual Wellness health risk assessment questionnaire and have noted the following in the patient's chart:  A.         Medical and social history & family history B.         Use of alcohol, tobacco, and illicit drugs  C.         Current medications and supplements D.         Functional and Cognitive ability and status E.         Nutritional status F.         Physical activity G.        Advance directives H.         List of other physicians I.          Hospitalizations, surgeries, and ER visits in previous 12 months J.         [place] such as hearing, vision, cognitive function, and depression L.         Referrals and appointments:   In addition, I have reviewed and discussed with patient certain preventive protocols, quality metrics, and best practice recommendations. A written personalized care plan for preventive services as well as general preventive health recommendations were provided to patient.   See attached scanned questionnaire for additional information.

## 2020-08-28 ENCOUNTER — Other Ambulatory Visit: Payer: Self-pay

## 2020-08-28 ENCOUNTER — Ambulatory Visit
Admission: RE | Admit: 2020-08-28 | Discharge: 2020-08-28 | Disposition: A | Discharge status: Home / Self Care | Payer: Medicare Other | Source: Ambulatory Visit | Attending: Family Medicine | Admitting: Family Medicine

## 2020-08-28 DIAGNOSIS — E2839 Other primary ovarian failure: Secondary | ICD-10-CM | POA: Insufficient documentation

## 2020-08-28 DIAGNOSIS — Z1231 Encounter for screening mammogram for malignant neoplasm of breast: Secondary | ICD-10-CM | POA: Diagnosis present

## 2020-08-28 DIAGNOSIS — Z0000 Encounter for general adult medical examination without abnormal findings: Secondary | ICD-10-CM | POA: Diagnosis present

## 2020-09-04 ENCOUNTER — Ambulatory Visit: Payer: 59 | Admitting: Neurology

## 2020-09-15 ENCOUNTER — Other Ambulatory Visit: Payer: Self-pay

## 2020-09-15 ENCOUNTER — Encounter: Payer: Self-pay | Admitting: Neurology

## 2020-09-15 ENCOUNTER — Ambulatory Visit (INDEPENDENT_AMBULATORY_CARE_PROVIDER_SITE_OTHER): Payer: Medicare Other | Admitting: Neurology

## 2020-09-15 VITALS — BP 145/80 | HR 62 | Resp 18 | Ht 63.0 in | Wt 237.0 lb

## 2020-09-15 DIAGNOSIS — G5603 Carpal tunnel syndrome, bilateral upper limbs: Secondary | ICD-10-CM | POA: Diagnosis not present

## 2020-09-15 DIAGNOSIS — G250 Essential tremor: Secondary | ICD-10-CM | POA: Diagnosis not present

## 2020-09-15 DIAGNOSIS — G7000 Myasthenia gravis without (acute) exacerbation: Secondary | ICD-10-CM | POA: Diagnosis not present

## 2020-09-15 MED ORDER — PYRIDOSTIGMINE BROMIDE 60 MG PO TABS: ORAL_TABLET | ORAL | 3 refills | Status: DC

## 2020-09-15 NOTE — Progress Notes (Signed)
Follow-up Visit   Date: 34/74/25   Jasmine Woods MRN: [id number] DOB: February 14, 1956   Interim History: Jasmine Woods is a 65 y.o. right-handed Caucasian female with hyperlipidemia, asthma, and diet-controlled diabetes  returning to the clinic for follow-up of seronegative myasthenia gravis and carpal tunnel syndrome.  The patient was accompanied to the clinic by self.  She is here for 1 year follow-up.  She has recovered well from CTS release and has no ongoing paresthesias.  She has some pain at the base of the right thumb, which is attributes to arthritis.   No issues with her myasthenia.  She has rare spells of double vision when fatigued in the evening, but otherwise denies droopiness or facial weakness.  She has a mild hand tremor which does not limit her daily activities.   Medications:  Current Outpatient Medications on File Prior to Visit  Medication Sig Dispense Refill  . ACCU-CHEK GUIDE test strip USE AS DIRECTED 100 strip 11  . acetaminophen (TYLENOL) 500 MG tablet Take 1,000 mg by mouth every 6 (six) hours as needed for mild pain. Reported on 07/06/2015    . albuterol (VENTOLIN HFA) 108 (90 Base) MCG/ACT inhaler INHALE 2 PUFFS BY MOUTH EVERY 4 HOURS AS NEEDED FOR WHEEZING OR SHORTNESS OF BREATH. 18 g 0  . brimonidine-timolol (COMBIGAN) 0.2-0.5 % ophthalmic solution 1 drop daily.     . budesonide (RHINOCORT AQUA) 32 MCG/ACT nasal spray Place 2 sprays into both nostrils at bedtime. 15 mL 2  . Coenzyme Q10 (CO Q 10) 100 MG CAPS Take 1 capsule by mouth daily. 30 capsule 2  . Dulaglutide (TRULICITY) 1.5 PP/2.9JJ SOPN Inject 1.5 mg into the skin once a week. 4 mL 2  . DULoxetine (CYMBALTA) 60 MG capsule Take 1 capsule (60 mg total) by mouth daily. 90 capsule 1  . Lancets (FREESTYLE) lancets 1 each 3 (three) times daily.    Marland Kitchen latanoprost (XALATAN) 0.005 % ophthalmic solution Place 1 drop into the left eye at bedtime.     Marland Kitchen loratadine (CLARITIN) 10 MG  tablet Take 1 tablet (10 mg total) by mouth 2 (two) times daily. 60 tablet 0  . montelukast (SINGULAIR) 10 MG tablet Take 1 tablet (10 mg total) by mouth at bedtime. 90 tablet 1  . Multiple Vitamins-Minerals (MULTIVITAMIN WITH MINERALS) tablet Take 1 tablet by mouth daily.    . Omega-3 Fatty Acids (FISH OIL) 1200 MG CAPS Take 2 capsules by mouth daily.    Marland Kitchen pyridostigmine (MESTINON) 60 MG tablet Take 1 tablet at 6:30a, 11a, and 3:30p.  OK to take extra dose as needed. 360 tablet 3  . rosuvastatin (CRESTOR) 5 MG tablet Take 1 tablet (5 mg total) by mouth daily. 90 tablet 1  . telmisartan-hydrochlorothiazide (MICARDIS HCT) 80-25 MG tablet Take 1 tablet by mouth daily. 90 tablet 1   No current facility-administered medications on file prior to visit.    Allergies:  Allergies  Allergen Reactions  . Aspirin Swelling    Upset stomach  Upset stomach   . Azithromycin     Contraindicated in Myasthenia   . Ace Inhibitors     cough  . Amlodipine     Edema of feet and hands  . Gabapentin     anxiety  . Pregabalin Other (See Comments)    anxiety     Vital Signs:  BP (!) 145/80   Pulse 62   Resp 18   Ht 5\' 3"  (1.6 m)   Wt 237  lb (107.5 kg)   SpO2 99%   BMI 41.98 kg/m   Neurological Exam: MENTAL STATUS including orientation to time, place, person, recent and remote memory, attention span and concentration, language, and fund of knowledge is normal.  Speech is not dysarthric.  CRANIAL NERVES:  No visual field defects.  Pupils equal round and reactive to light.  Mildly restricted lateral gaze in the right eye, otherwise normal conjugate, extra-ocular eye movements in all directions of gaze.  No ptosis.  Face is symmetric.  Facial muscles are 5/5.    MOTOR:  Motor strength is 5/5 in all extremities.  There is mild intention tremor bilaterally in the hands, no rest tremor.    MSRs:  Reflexes are 2+/4 throughout.  SENSORY:  Intact to vibration throughout   COORDINATION/GAIT:   Gait  mildly-wide based due to body habitus, stable and unassisted  Data: NCS/EMG of the arms 04/07/2019:  Bilateral median neuropathy at or distal to the wrist, consistent with a clinical diagnosis of carpal tunnel syndrome.  Overall, these findings are moderate in degree electrically and worse on the left.   IMPRESSION/PLAN: 1.  Seronegative ocular myasthenia gravis without exacerbation (diagnosed 2017 with SFEMG), stable  - Continue mestinon 60mg  at 8am, noon, and 4pm  2.  Bilateral carpal tunnel syndrome s/p release, markedly improved.  No residual paresthesias.   3.  Essential tremor, mild.  Not interfering with daily activities  - monitor  Return to clinic in 1 year  Thank you for allowing me to participate in patient's care.  If I can answer any additional questions, I would be pleased to do so.    Sincerely,    Genevie Elman K. Posey Pronto, DO

## 2020-09-15 NOTE — Patient Instructions (Signed)
Return to clinic in 1 year.

## 2020-09-27 NOTE — Progress Notes (Signed)
Name: Jasmine Woods   MRN: [id number]    DOB: [birth year]   Date:09/29/2020       Progress Note  Subjective  Chief Complaint  Follow up   HPI   DMII:She was off medication afterbariatric surgery in 2014,but we re-started medication 22/9798 - Trulicity, however since she is now on Medicare we will switch back to Metformin.   HgbA1Chas been well controlled. Neuropathy is still 2/10, constant, burning-like on both feet, she is on Cymbalta. She could not tolerate gabapentin or Lyrica and also did not work.She states Duloxetine controls symptoms. She also has associated HTN, obesity and dyslipidemia. Discussed weight watchers   HTN: she is on Micardis 80/25 mg, bp is at goal today, no chest pain, palpitation . She denies dizziness   Hyperlipidemia: takingCrestor more often since last visit, she states usually forgets to take it at night, advised to take in the mornings. No myalgia, goal LDL is below 70   Asthma:shewas seeingDr. Felicie Morn and PFT, she was placed on Breo but currently only on allergies medications and uses Ventolin very seldom but not recently , she has SOB with activity that may be multifactorial  Myasthenia Gravis: diagnosed at [facility] by Dr. Burnett Harry  09/2015, seronegative, only causing ptosis and double vision, no other symptoms. She had a negative chest CT for thymoma, and has started on medication ( Mestinon ), she states it works quickly but wears out very fast. She takes before drivingto and until she gets home, takes 4 daily.Does not usually bother her to have double vision when at home.Sheis now seeingDr. Thresa Ross Neurology , symptoms are stable    Abnormal CT thyroid: incidental finding of thyroid calcification of left lobe, she saw Endo and had negative biopsy.She will go back in 2023 for repeat labs and [place] - Dr. Loanne Drilling   Bariatric Surgery/Obesity: she had bariatric surgery back in 2014 , her weight was almost 300 lbs, went down to  close to 200lbs, butwasgradually gaining weight,she was started on Trulicity 92/1194 and RDEY81 lbs, butis gaining it back, shewas241 lbsJuly 2020 and we started contrave, she has lost 11 lbs on the medication ( 229 lbs ), she was out of medication for a while and gained some weight, we gave a new rx Feb 2021 she titrated dose up, but there a gap with PA and she had to re-start medication April  2021 ,weight went from  233 lbs to 229 .8 lbs, but gradually gaining weight again, she is eating out occasionally only, she recently retired, discussed increasing in physical activity    OSA: shehas a new machine, she has not been back to pulmonologist since Dr. Felicie Morn left the practice   Patient Active Problem List   Diagnosis Date Noted  . Carpal tunnel syndrome, right upper limb 08/19/2019  . Carpal tunnel syndrome, left upper limb 06/08/2019  . Multinodular goiter 11/25/2018  . Essential hypertension 11/11/2018  . DOE (dyspnea on exertion) 04/15/2018  . Hyperlipidemia 12/09/2017  . Diaphoresis 11/26/2017  . Special screening for malignant neoplasms, colon   . Epiretinal membrane (ERM) of left eye 09/09/2016  . Glaucoma, left eye 09/05/2016  . Seronegative myasthenia gravis ([place]) 08/08/2015  . Psoriasis 07/06/2015  . Stress incontinence 07/06/2015  . Chronic tension-type headache, intractable 05/29/2015  . Anxiety and depression 04/12/2015  . Asthma, mild intermittent 04/12/2015  . Bladder polyp 04/12/2015  . Carpal tunnel syndrome 04/12/2015  . Binocular vision disorder with diplopia 04/12/2015  . Dyslipidemia 04/12/2015  . H/O: HTN (hypertension)  04/12/2015  . Hemorrhoids, internal 04/12/2015  . Neuropathy 04/12/2015  . Nuclear sclerotic cataract 04/12/2015  . Morbid obesity ([place]) 04/12/2015  . Obstructive apnea 04/12/2015  . Perennial allergic rhinitis 04/12/2015  . 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 Type 2 diabetes, controlled, with  neuropathy ([place]) 04/12/2015  . Vitamin D deficiency 04/12/2015  . Cephalalgia 08/16/2014  . Lump or mass in breast 06/14/2013  . Bariatric surgery status 10/12/2012    Past Surgical History:  Procedure Laterality Date  . ABDOMINAL HYSTERECTOMY  1991  . [place] SURGERY  2014  . CARPAL TUNNEL RELEASE Left 06/08/2019   Procedure: LEFT CARPAL TUNNEL RELEASE;  Surgeon: Mcarthur Rossetti, MD;  Location: [place];  Service: Orthopedics;  Laterality: Left;  . CARPAL TUNNEL RELEASE Right 08/19/2019   Procedure: RIGHT CARPAL TUNNEL RELEASE;  Surgeon: Mcarthur Rossetti, MD;  Location: [place];  Service: Orthopedics;  Laterality: Right;  . CATARACT EXTRACTION W/PHACO Left 01/28/2017   Procedure: CATARACT EXTRACTION PHACO AND INTRAOCULAR LENS PLACEMENT (IOC);  Surgeon: Birder Robson, MD;  Location: [facility];  Service: Ophthalmology;  Laterality: Left;  [place] 00:37 AP% 17.8 CDE 6.61 Fluid pack lot # [id number]  . COLONOSCOPY  2012  . COLONOSCOPY WITH PROPOFOL N/A 09/30/2016   Procedure: COLONOSCOPY WITH PROPOFOL;  Surgeon: Lucilla Lame, MD;  Location: [place];  Service: Endoscopy;  Laterality: N/A;  diabetic - diet controlled sleep apnea  . KNEE SURGERY Right 1979-1980  . OOPHORECTOMY    . PARS PLANA VITRECTOMY Left 03/27/2016   Procedure: PARS PLANA VITRECTOMY WITH 25 GAUGE AND MEMBRANE PEEL, INTERNAL LIMITING MEMBRANE;  Surgeon: Hurman Horn, MD;  Location: [place];  Service: Ophthalmology;  Laterality: Left;  . THYROID SURGERY     BX done was benign  . UPPER GI ENDOSCOPY    . VITRECTOMY Left 04/27/2016    Family History  Problem Relation Age of Onset  . Diabetes Mother   . Hypertension Mother   . Heart disease Mother 34       stent x 1   . Migraines Sister   . Stroke Maternal Grandfather   . Diabetes Paternal Grandfather   . Muscular dystrophy Sister   . Heart attack Maternal Aunt   . Breast cancer Neg Hx   . Thyroid disease Neg  Hx     Social History   Tobacco Use  . Smoking status: Former Smoker    Packs/day: 0.50    Years: 3.00    Pack years: 1.50    Start date: 06/24/1972    Quit date: 06/25/1975    Years since quitting: 45.2  . Smokeless tobacco: Never Used  . Tobacco comment: smoked as teenager  Substance Use Topics  . Alcohol use: No    Alcohol/week: 0.0 standard drinks     Current Outpatient Medications:  .  acetaminophen (TYLENOL) 500 MG tablet, Take 1,000 mg by mouth every 6 (six) hours as needed for mild pain. Reported on 07/06/2015, Disp: , Rfl:  .  albuterol (VENTOLIN HFA) 108 (90 Base) MCG/ACT inhaler, INHALE 2 PUFFS BY MOUTH EVERY 4 HOURS AS NEEDED FOR WHEEZING OR SHORTNESS OF BREATH., Disp: 18 g, Rfl: 0 .  Blood Glucose Monitoring Suppl (FREESTYLE FREEDOM LITE) w/Device KIT, USE AS DIRECTED, Disp: 1 kit, Rfl: 0 .  brimonidine-timolol (COMBIGAN) 0.2-0.5 % ophthalmic solution, 1 drop daily. , Disp: , Rfl:  .  budesonide (RHINOCORT AQUA) 32 MCG/ACT nasal spray, Place 2 sprays into both nostrils  at bedtime., Disp: 15 mL, Rfl: 2 .  Coenzyme Q10 (CO Q 10) 100 MG CAPS, Take 1 capsule by mouth daily., Disp: 30 capsule, Rfl: 2 .  glucose blood test strip, USE AS DIRECTED, Disp: 100 strip, Rfl: 11 .  Lancets (FREESTYLE) lancets, 1 each 3 (three) times daily., Disp: , Rfl:  .  Lancets (FREESTYLE) lancets, USE AS DIRECTED, Disp: 100 each, Rfl: 11 .  latanoprost (XALATAN) 0.005 % ophthalmic solution, Place 1 drop into the left eye at bedtime. , Disp: , Rfl:  .  loratadine (CLARITIN) 10 MG tablet, Take 1 tablet (10 mg total) by mouth 2 (two) times daily., Disp: 60 tablet, Rfl: 0 .  Multiple Vitamins-Minerals (MULTIVITAMIN WITH MINERALS) tablet, Take 1 tablet by mouth daily., Disp: , Rfl:  .  Omega-3 Fatty Acids (FISH OIL) 1200 MG CAPS, Take 2 capsules by mouth daily., Disp: , Rfl:  .  pyridostigmine (MESTINON) 60 MG tablet, Take 1 tablet at 8a, noona, and 4p.  OK to take extra dose as needed., Disp: 360  tablet, Rfl: 3 .  DULoxetine (CYMBALTA) 60 MG capsule, TAKE 1 CAPSULE (60 MG TOTAL) BY MOUTH DAILY., Disp: 90 capsule, Rfl: 1 .  metFORMIN (GLUCOPHAGE-XR) 750 MG 24 hr tablet, Take 1 tablet (750 mg total) by mouth daily with breakfast., Disp: 180 tablet, Rfl: 1 .  montelukast (SINGULAIR) 10 MG tablet, TAKE 1 TABLET (10 MG TOTAL) BY MOUTH AT BEDTIME., Disp: 90 tablet, Rfl: 1 .  rosuvastatin (CRESTOR) 5 MG tablet, TAKE 1 TABLET (5 MG TOTAL) BY MOUTH DAILY., Disp: 90 tablet, Rfl: 1 .  telmisartan-hydrochlorothiazide (MICARDIS HCT) 80-25 MG tablet, Take 1 tablet by mouth daily., Disp: 90 tablet, Rfl: 1  Allergies  Allergen Reactions  . Aspirin Swelling    Upset stomach  Upset stomach   . Azithromycin     Contraindicated in Myasthenia   . Ace Inhibitors     cough  . Amlodipine     Edema of feet and hands  . Gabapentin     anxiety  . Pregabalin Other (See Comments)    anxiety    I personally reviewed active problem list, medication list, allergies, family history, social history, health maintenance with the patient/caregiver today.   ROS  Constitutional: Negative for fever , positive for  weight change.  Respiratory: Negative for cough and shortness of breath.   Cardiovascular: Negative for chest pain or palpitations.  Gastrointestinal: Negative for abdominal pain, no bowel changes.  Musculoskeletal: Negative for gait problem or joint swelling.  Skin: Negative for rash.  Neurological: Negative for dizziness or headache.  No other specific complaints in a complete review of systems (except as listed in HPI above).  Objective  Vitals:   09/29/20 0914  BP: 124/76  Pulse: 73  Resp: 16  Temp: 98.2 F (36.8 C)  TempSrc: Oral  SpO2: 98%  Weight: 238 lb (108 kg)  Height: '5\' 3"'  (1.6 m)    Body mass index is 42.16 kg/m.  Physical Exam  Constitutional: Patient appears well-developed and well-nourished. Obese  No distress.  HEENT: head atraumatic, normocephalic, pupils equal  and reactive to light,  neck supple Cardiovascular: Normal rate, regular rhythm and normal heart sounds.  No murmur heard. No BLE edema. Pulmonary/Chest: Effort normal and breath sounds normal. No respiratory distress. Abdominal: Soft.  There is no tenderness. Psychiatric: Patient has a normal mood and affect. behavior is normal. Judgment and thought content normal.  Recent Results (from the past 2160 hour(s))  POCT HgB A1C  Status: Abnormal   Collection Time: 09/29/20  9:20 AM  Result Value Ref Range   Hemoglobin A1C 5.8 (A) 4.0 - 5.6 %   HbA1c POC (<> result, manual entry)     HbA1c, POC (prediabetic range)     HbA1c, POC (controlled diabetic range)       PHQ2/9: Depression screen [facility] 2/9 09/29/2020 07/31/2020 03/29/2020 11/02/2019 08/13/2019  Decreased Interest 0 0 0 0 0  Down, Depressed, Hopeless 0 0 0 0 0  PHQ - 2 Score 0 0 0 0 0  Altered sleeping - - - 3 0  Tired, decreased energy - - - 1 1  Change in appetite - - - 0 1  Feeling bad or failure about yourself  - - - 0 0  Trouble concentrating - - - 0 0  Moving slowly or fidgety/restless - - - 0 0  Suicidal thoughts - - - 0 0  PHQ-9 Score - - - 4 2  Difficult doing work/chores - - - Somewhat difficult Not difficult at all  Some recent data might be hidden    phq 9 is negative   Fall Risk: Fall Risk  09/29/2020 09/15/2020 07/31/2020 03/29/2020 11/02/2019  Falls in the past year? '1 1 1 1 ' 0  Number falls in past yr: '1 1 1 1 ' 0  Injury with Fall? 0 0 1 1 0  Risk for fall due to : - - History of fall(s) - -  Follow up - - Education provided - -    Functional Status Survey: Is the patient deaf or have difficulty hearing?: No Does the patient have difficulty seeing, even when wearing glasses/contacts?: Yes Does the patient have difficulty concentrating, remembering, or making decisions?: No Does the patient have difficulty walking or climbing stairs?: No Does the patient have difficulty dressing or bathing?: No Does the patient  have difficulty doing errands alone such as visiting a doctor's office or shopping?: No   Assessment & Plan  1. Type 2 diabetes, controlled, with neuropathy (HCC)  - POCT HgB A1C - metFORMIN (GLUCOPHAGE-XR) 750 MG 24 hr tablet; Take 1 tablet (750 mg total) by mouth daily with breakfast.  Dispense: 180 tablet; Refill: 1 - DULoxetine (CYMBALTA) 60 MG capsule; TAKE 1 CAPSULE (60 MG TOTAL) BY MOUTH DAILY.  Dispense: 90 capsule; Refill: 1  2. Perennial allergic rhinitis  - montelukast (SINGULAIR) 10 MG tablet; TAKE 1 TABLET (10 MG TOTAL) BY MOUTH AT BEDTIME.  Dispense: 90 tablet; Refill: 1  3. Peripheral polyneuropathy  - DULoxetine (CYMBALTA) 60 MG capsule; TAKE 1 CAPSULE (60 MG TOTAL) BY MOUTH DAILY.  Dispense: 90 capsule; Refill: 1  4. Dyslipidemia  - rosuvastatin (CRESTOR) 5 MG tablet; TAKE 1 TABLET (5 MG TOTAL) BY MOUTH DAILY.  Dispense: 90 tablet; Refill: 1  5. Obstructive apnea   6. OSA   7. Vitamin D deficiency  Continue supplementation   8. Seronegative myasthenia gravis ([place])  Under the care of neurologist   9. Dyslipidemia associated with type 2 diabetes mellitus ([place])   10. History of bariatric surgery   11. Hypertension, benign  - telmisartan-hydrochlorothiazide (MICARDIS HCT) 80-25 MG tablet; Take 1 tablet by mouth daily.  Dispense: 90 tablet; Refill: 1  12. Diabetes mellitus with coincident hypertension (HCC)  - telmisartan-hydrochlorothiazide (MICARDIS HCT) 80-25 MG tablet; Take 1 tablet by mouth daily.  Dispense: 90 tablet; Refill: 1

## 2020-09-29 ENCOUNTER — Encounter: Payer: Self-pay | Admitting: Family Medicine

## 2020-09-29 ENCOUNTER — Other Ambulatory Visit: Payer: Self-pay

## 2020-09-29 ENCOUNTER — Ambulatory Visit (INDEPENDENT_AMBULATORY_CARE_PROVIDER_SITE_OTHER): Payer: Medicare Other | Admitting: Family Medicine

## 2020-09-29 VITALS — BP 124/76 | HR 73 | Temp 98.2°F | Resp 16 | Ht 63.0 in | Wt 238.0 lb

## 2020-09-29 DIAGNOSIS — J3089 Other allergic rhinitis: Secondary | ICD-10-CM | POA: Diagnosis not present

## 2020-09-29 DIAGNOSIS — Z9884 Bariatric surgery status: Secondary | ICD-10-CM

## 2020-09-29 DIAGNOSIS — E785 Hyperlipidemia, unspecified: Secondary | ICD-10-CM | POA: Diagnosis not present

## 2020-09-29 DIAGNOSIS — I10 Essential (primary) hypertension: Secondary | ICD-10-CM

## 2020-09-29 DIAGNOSIS — G629 Polyneuropathy, unspecified: Secondary | ICD-10-CM | POA: Diagnosis not present

## 2020-09-29 DIAGNOSIS — E1140 Type 2 diabetes mellitus with diabetic neuropathy, unspecified: Secondary | ICD-10-CM | POA: Diagnosis not present

## 2020-09-29 DIAGNOSIS — E559 Vitamin D deficiency, unspecified: Secondary | ICD-10-CM

## 2020-09-29 DIAGNOSIS — E1169 Type 2 diabetes mellitus with other specified complication: Secondary | ICD-10-CM

## 2020-09-29 DIAGNOSIS — G7000 Myasthenia gravis without (acute) exacerbation: Secondary | ICD-10-CM

## 2020-09-29 DIAGNOSIS — G4733 Obstructive sleep apnea (adult) (pediatric): Secondary | ICD-10-CM

## 2020-09-29 DIAGNOSIS — J4530 Mild persistent asthma, uncomplicated: Secondary | ICD-10-CM

## 2020-09-29 DIAGNOSIS — E119 Type 2 diabetes mellitus without complications: Secondary | ICD-10-CM

## 2020-09-29 LAB — POCT GLYCOSYLATED HEMOGLOBIN (HGB A1C): Hemoglobin A1C: 5.8 % — AB (ref 4.0–5.6)

## 2020-09-29 MED ORDER — ROSUVASTATIN CALCIUM 5 MG PO TABS: ORAL_TABLET | Freq: Every day | ORAL | 1 refills | Status: DC

## 2020-09-29 MED ORDER — DULOXETINE HCL 60 MG PO CPEP
ORAL_CAPSULE | Freq: Every day | ORAL | 1 refills | Status: DC
Start: 2020-09-29 — End: 2021-01-29

## 2020-09-29 MED ORDER — METFORMIN HCL ER 750 MG PO TB24: 750.0000 mg | ORAL_TABLET | Freq: Every day | ORAL | 1 refills | Status: DC

## 2020-09-29 MED ORDER — TELMISARTAN-HCTZ 80-25 MG PO TABS: 1.0000 | ORAL_TABLET | Freq: Every day | ORAL | 1 refills | Status: DC

## 2020-09-29 MED ORDER — MONTELUKAST SODIUM 10 MG PO TABS: ORAL_TABLET | Freq: Every day | ORAL | 1 refills | Status: DC

## 2020-10-17 ENCOUNTER — Ambulatory Visit: Payer: Medicare Other | Attending: Internal Medicine

## 2020-10-17 DIAGNOSIS — Z23 Encounter for immunization: Secondary | ICD-10-CM

## 2020-10-17 NOTE — Progress Notes (Signed)
   Covid-19 Vaccination Clinic  Name:  Jasmine Woods    MRN: [id number] DOB: 10/22/1955  10/17/2020  Jasmine Woods was observed post Covid-19 immunization for 15 minutes without incident. She was provided with Vaccine Information Sheet and instruction to access the V-Safe system.   Jasmine Woods was instructed to call 911 with any severe reactions post vaccine: Marland Kitchen Difficulty breathing  . Swelling of face and throat  . A fast heartbeat  . A bad rash all over body  . Dizziness and weakness   Immunizations Administered    Name Date Dose VIS Date Route   PFIZER Comrnaty(Gray TOP) Covid-19 Vaccine 10/17/2020  9:39 AM 0.3 mL 06/01/2020 Intramuscular   Manufacturer: [employer], [employer]   Lot: [id number]   NDC: [phone]

## 2020-10-18 ENCOUNTER — Other Ambulatory Visit: Payer: Self-pay

## 2020-10-18 MED ORDER — PFIZER-BIONT COVID-19 VAC-TRIS 30 MCG/0.3ML IM SUSP
INTRAMUSCULAR | 0 refills | Status: DC
  Filled 2020-10-18: qty 0.3, 1d supply, fill #0

## 2020-12-18 LAB — HM DIABETES EYE EXAM

## 2020-12-22 ENCOUNTER — Telehealth (INDEPENDENT_AMBULATORY_CARE_PROVIDER_SITE_OTHER): Payer: Medicare Other | Admitting: Family Medicine

## 2020-12-22 ENCOUNTER — Other Ambulatory Visit: Payer: Self-pay

## 2020-12-22 ENCOUNTER — Encounter: Payer: Self-pay | Admitting: Family Medicine

## 2020-12-22 DIAGNOSIS — E1140 Type 2 diabetes mellitus with diabetic neuropathy, unspecified: Secondary | ICD-10-CM | POA: Diagnosis not present

## 2020-12-22 DIAGNOSIS — J4521 Mild intermittent asthma with (acute) exacerbation: Secondary | ICD-10-CM | POA: Diagnosis not present

## 2020-12-22 DIAGNOSIS — U071 COVID-19: Secondary | ICD-10-CM | POA: Diagnosis not present

## 2020-12-22 MED ORDER — BENZONATATE 100 MG PO CAPS: 100.0000 mg | ORAL_CAPSULE | Freq: Two times a day (BID) | ORAL | 0 refills | Status: DC | PRN

## 2020-12-22 MED ORDER — FLUTICASONE FUROATE-VILANTEROL 100-25 MCG/INH IN AEPB
1.0000 | INHALATION_SPRAY | Freq: Every day | RESPIRATORY_TRACT | 0 refills | Status: DC
Start: 2020-12-22 — End: 2021-01-29

## 2020-12-22 MED ORDER — NIRMATRELVIR/RITONAVIR (PAXLOVID)TABLET: 3.0000 | ORAL_TABLET | Freq: Two times a day (BID) | ORAL | 0 refills | Status: AC

## 2020-12-22 NOTE — Progress Notes (Signed)
Name: Jasmine Woods   MRN: [id number]    DOB: 1955/07/15   Date:12/22/2020       Progress Note  Subjective  Chief Complaint  Chief Complaint  Patient presents with   Covid Positive    I connected with  [name]  on 12/22/20 at  3:40 PM EDT by telephone.  I discussed the limitations of evaluation and management by telemedicine and the availability of in person appointments. The patient expressed understanding and agreed to proceed with the virtual visit  Staff also discussed with the patient that there may be a patient responsible charge related to this service. Patient Location: at home  Provider Location: [facility] Additional Individuals present: husband   HPI  COVID-19 infection: she states symptoms started with scratchy throat yesterday, but it got progressively worse and she had a positive home COVID-19 test ( daughter tested positive the same day) . She states feeling like she has the flu, throat feels better, but has body aches, cough slightly productive wheezing , headache ,nasal congestion and fatigue. She has been using her nasal sprays , singulair , using albuterol inhaler more often ( three times yesterday and twice today) and taking Tylenol. No fever or chills. Decrease of appetite, but is eating a little and drinking plenty of fluids. Denies lack of sense of smell or taste   Assessment & Plan  1. COVID-19  Outpatient Oral COVID Treatment Note  I connected with [name] on 12/22/2020/1:05 PM by telephone and verified that I am speaking with the correct person using two identifiers.  I discussed the limitations, risks, security, and privacy concerns of performing an evaluation and management service by telephone and the availability of in person appointments. I also discussed with the patient that there may be a patient responsible charge related to this service. The patient expressed understanding and agreed to proceed.  Patient location:  home  Provider location:[facility]  Diagnosis: COVID-19 infection   Past Medical History:  Diagnosis Date   Allergy    Anxiety    Asthma    Bladder polyp    Dr. Ernst Spell   Carpal tunnel syndrome    Complication of anesthesia    needed nebulizer after surgery   Depression    Diabetes mellitus without complication ([place])    Diet controlled   Double vision    seen by Dr. 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 myastenia gravis   Dyslipidemia    Frequency of urination    Glaucoma, left eye    H/O myasthenia gravis    MOSTLY AFFECTS EYES   HA (headache)    Heart murmur    followed by PCP   Hx of bariatric surgery    Hypertension    no meds   Internal hemorrhoids    Myasthenia gravis ([place])    "mostly effects eyes"   Myasthenia gravis ([place])    Neuropathy, generalized    diabetic on feet   Nuclear sclerotic cataract of both eyes    Obesity (BMI 30-39.9)    Obstructive sleep apnea on CPAP    uses cpap   Patient is Jehovah's Witness    no blood products   Rotator cuff tendonitis 07/2009   also has impingment right shoulder pain, seen by Dr. Mack Guise   Sleep apnea    Thyroid nodule    Vaginal dryness, menopausal    Vitamin D deficiency     Allergies  Allergen Reactions   Aspirin Swelling    Upset stomach  Upset stomach  Azithromycin     Contraindicated in Myasthenia    Ace Inhibitors     cough   Amlodipine     Edema of feet and hands   Gabapentin     anxiety   Pregabalin Other (See Comments)    anxiety     Current Outpatient Medications:    acetaminophen (TYLENOL) 500 MG tablet, Take 1,000 mg by mouth every 6 (six) hours as needed for mild pain. Reported on 07/06/2015, Disp: , Rfl:    albuterol (VENTOLIN HFA) 108 (90 Base) MCG/ACT inhaler, INHALE 2 PUFFS BY MOUTH EVERY 4 HOURS AS NEEDED FOR WHEEZING OR SHORTNESS OF BREATH., Disp: 18 g, Rfl: 0   benzonatate (TESSALON) 100 MG capsule, Take 1-2 capsules (100-200 mg total) by mouth 2 (two) times daily as needed., Disp: 60 capsule,  Rfl: 0   brimonidine-timolol (COMBIGAN) 0.2-0.5 % ophthalmic solution, 1 drop daily. , Disp: , Rfl:    budesonide (RHINOCORT AQUA) 32 MCG/ACT nasal spray, Place 2 sprays into both nostrils at bedtime., Disp: 15 mL, Rfl: 2   Coenzyme Q10 (CO Q 10) 100 MG CAPS, Take 1 capsule by mouth daily., Disp: 30 capsule, Rfl: 2   DULoxetine (CYMBALTA) 60 MG capsule, TAKE 1 CAPSULE (60 MG TOTAL) BY MOUTH DAILY., Disp: 90 capsule, Rfl: 1   fluticasone furoate-vilanterol (BREO ELLIPTA) 100-25 MCG/INH AEPB, Inhale 1 puff into the lungs daily., Disp: 60 each, Rfl: 0   latanoprost (XALATAN) 0.005 % ophthalmic solution, Place 1 drop into the left eye at bedtime. , Disp: , Rfl:    loratadine (CLARITIN) 10 MG tablet, Take 1 tablet (10 mg total) by mouth 2 (two) times daily., Disp: 60 tablet, Rfl: 0   metFORMIN (GLUCOPHAGE-XR) 750 MG 24 hr tablet, Take 1 tablet (750 mg total) by mouth daily with breakfast., Disp: 180 tablet, Rfl: 1   montelukast (SINGULAIR) 10 MG tablet, TAKE 1 TABLET (10 MG TOTAL) BY MOUTH AT BEDTIME., Disp: 90 tablet, Rfl: 1   Multiple Vitamins-Minerals (MULTIVITAMIN WITH MINERALS) tablet, Take 1 tablet by mouth daily., Disp: , Rfl:    nirmatrelvir/ritonavir EUA (PAXLOVID) TABS, Take 3 tablets by mouth 2 (two) times daily for 5 days. (Take nirmatrelvir 150 mg two tablets twice daily for 5 days and ritonavir 100 mg one tablet twice daily for 5 days) Patient GFR is 60, Disp: 30 tablet, Rfl: 0   Omega-3 Fatty Acids (FISH OIL) 1200 MG CAPS, Take 2 capsules by mouth daily., Disp: , Rfl:    pyridostigmine (MESTINON) 60 MG tablet, Take 1 tablet at 8a, noona, and 4p.  OK to take extra dose as needed., Disp: 360 tablet, Rfl: 3   rosuvastatin (CRESTOR) 5 MG tablet, TAKE 1 TABLET (5 MG TOTAL) BY MOUTH DAILY., Disp: 90 tablet, Rfl: 1   telmisartan-hydrochlorothiazide (MICARDIS HCT) 80-25 MG tablet, Take 1 tablet by mouth daily., Disp: 90 tablet, Rfl: 1   Blood Glucose Monitoring Suppl (FREESTYLE FREEDOM LITE)  w/Device KIT, USE AS DIRECTED (Patient not taking: Reported on 12/22/2020), Disp: 1 kit, Rfl: 0  Objective: Patient appears/sounds nasally congested.  They are in no apparent distress.  Breathing is non labored.  Mood and behavior are normal.  Laboratory Data:  Recent Results (from the past 2160 hour(s))  POCT HgB A1C     Status: Abnormal   Collection Time: 09/29/20  9:20 AM  Result Value Ref Range   Hemoglobin A1C 5.8 (A) 4.0 - 5.6 %   HbA1c POC (<> result, manual entry)     HbA1c, POC (prediabetic  range)     HbA1c, POC (controlled diabetic range)    HM DIABETES EYE EXAM     Status: None   Collection Time: 12/18/20 12:00 AM  Result Value Ref Range   HM Diabetic Eye Exam No Retinopathy No Retinopathy     Assessment: 65 y.o. female with mild/moderate COVID 19 viral infection diagnosed on 0706/30/2022  at high risk for progression to severe COVID 19.  Plan:  This patient is a 64 y.o. female that meets the following criteria for Emergency Use Authorization of: Molnupiravir  Age >18 yr SARS-COV-2 positive test Symptom onset < 5 days Mild-to-moderate COVID disease with high risk for severe progression to hospitalization or death  I have spoken and communicated the following to the patient or parent/caregiver regarding: Paxlovid is an unapproved drug that is authorized for use under an Emergency Use Authorization.  There are no adequate, approved, available products for the treatment of COVID-19 in adults who have mild-to-moderate COVID-19 and are at high risk for progressing to severe COVID-19, including hospitalization or death. Other therapeutics are currently authorized. For additional information on all products authorized for treatment or prevention of COVID-19, please see [employer].  There are benefits and risks of taking this treatment as outlined in the "Fact Sheet for  Patients and Caregivers."  "Fact Sheet for Patients and Caregivers" was reviewed with patient. A hard copy will be provided to patient from pharmacy prior to the patient receiving treatment. Patients should continue to self-isolate and use infection control measures (e.g., wear mask, isolate, social distance, avoid sharing personal items, clean and disinfect "high touch" surfaces, and frequent handwashing) according to CDC guidelines.  The patient or parent/caregiver has the option to accept or refuse treatment. Patient medication history was reviewed for potential drug interactions:No drug interactions Patient's GFR was calculated to be 60, and they were therefore prescribed Normal dose (GFR>60) - nirmatrelvir 163m tab (2 tablet) by mouth twice daily AND ritonavir 1063mtab (1 tablet) by mouth twice daily  After reviewing above information with the patient, the patient agrees to receive Paxlovid.  Follow up instructions:    Take prescription BID x 5 days as directed Reach out to pharmacist for counseling on medication if desired For concerns regarding further COVID symptoms please follow up with your PCP or urgent care For urgent or life-threatening issues, seek care at your local emergency department  The patient was provided an opportunity to ask questions, and all were answered. The patient agreed with the plan and demonstrated an understanding of the instructions.   Script sent to her pharmacy    - nirmatrelvir/ritonavir EUA (PAXLOVID) TABS; Take 3 tablets by mouth 2 (two) times daily for 5 days. (Take nirmatrelvir 150 mg two tablets twice daily for 5 days and ritonavir 100 mg one tablet twice daily for 5 days) Patient GFR is 60  Dispense: 30 tablet; Refill: 0  2. Type 2 diabetes, controlled, with neuropathy ([place] Explained importance of checking glucose more often while sick  3. Mild intermittent asthma with acute exacerbation  Discussed importance of using rescue inhaler, we  will give her samples of Breo - benzonatate (TESSALON) 100 MG capsule; Take 1-2 capsules (100-200 mg total) by mouth 2 (two) times daily as needed.  Dispense: 60 capsule; Refill: 0 - fluticasone furoate-vilanterol (BREO ELLIPTA) 100-25 MCG/INH AEPB; Inhale 1 puff into the lungs daily.  Dispense: 60 each; Refill: 0   I discussed the assessment and treatment plan with the patient. The patient was provided an opportunity  to ask questions and all were answered. The patient agreed with the plan and demonstrated an understanding of the instructions.  The patient was advised to call back or seek an in-person evaluation if the symptoms worsen or if the condition fails to improve as anticipated.  The patient was advised to call their PCP or seek an in-person evaluation if the symptoms worsen or if the condition fails to improve as anticipated.   I provided 25 minutes of non face-to-face telephone visit time during this encounter, and > 50% was spent counseling as documented under my assessment & plan.

## 2020-12-25 ENCOUNTER — Encounter: Payer: Self-pay | Admitting: Family Medicine

## 2021-01-26 NOTE — Progress Notes (Signed)
Name: Jasmine Woods   MRN: [id number]    DOB: 03-25-1956   Date:01/29/2021       Progress Note  Subjective  Chief Complaint  Follow Up  HPI  DMII:  She was off medication after bariatric surgery in 2014, but we re-started medication 11/3014 - Trulicity, however since she is now on Medicare and because of the cost  we  switch back to Metformin., she has gained weight since we stopped Trulicity    WFUX3A has been well controlled, tody is 5.7 % it was 5.8 % in April  .  Neuropathy is still 2/10 on both feet, and stable. , constant, burning- like . She is on Cymbalta to control pain.  She could not tolerate Gabapentin or Lyrica and also did not work. She also has associated HTN, obesity and dyslipidemia. She states she is thinking about water aerobics at the [facility]    HTN: she is on Micardis 80/25 mg, bp is at goal today, no chest pain, palpitation . She denies orthostatic changes    Hyperlipidemia: taking Crestor most nights, occastionally forgets to take it.  No myalgia, goal LDL is below 70    Asthma Mild Intermittent : she was seeing  Dr. Felicie Morn and PFT, she was placed on Breo but currently only on allergies medications and uses Ventolin very seldom but not recently , she has SOB with activity that may be multifactorial. Last flare was during COVID 11/2020    Myasthenia Gravis: diagnosed at [facility] by Dr. Burnett Harry  09/2015, seronegative, only causing ptosis and double vision, no other symptoms. She had a negative chest CT for thymoma, and has started on medication ( Mestinon ) She is now seeing Dr. Posey Pronto, [facility] , symptoms are stable     Abnormal CT thyroid: incidental finding of thyroid calcification of left lobe, she saw Endo and had negative biopsy.  She will go back in 2023 for repeat labs and [place] - Dr. Loanne Drilling    Bariatric Surgery/Obesity: she had bariatric surgery back in 2014 , her weight was almost 300 lbs, went down to close to 200lbs, but was  gradually  gaining weight,she was started on Trulicity 35/5732 and lost 15 lbs , but is gaining it back, she was 241 lbs July 2020 and we started [place], she has lost 11 lbs on the medication ( 229 lbs ), she was out of medication for a while and gained some weight , we gave a new rx Feb 2021 she titrated dose up, but there a gap with PA and she had to re-start medication April  2021 ,weight went from  233 lbs to 229 .8 lbs, but gradually gaining weight again, today weight is up to 202 lbs, off Trulicity only on Metformin now    OSA: she is compliant with CPAP, she has not been back to pulmonologist since Dr. Felicie Morn left the practice   Patient Active Problem List   Diagnosis Date Noted   Carpal tunnel syndrome, right upper limb 08/19/2019   Carpal tunnel syndrome, left upper limb 06/08/2019   Multinodular goiter 11/25/2018   Essential hypertension 11/11/2018   DOE (dyspnea on exertion) 04/15/2018   Hyperlipidemia 12/09/2017   Diaphoresis 11/26/2017   Special screening for malignant neoplasms, colon    Epiretinal membrane (ERM) of left eye 09/09/2016   Glaucoma, left eye 09/05/2016   Seronegative myasthenia gravis ([place]) 08/08/2015   Psoriasis 07/06/2015   Stress incontinence 07/06/2015   Chronic tension-type headache, intractable 05/29/2015   Anxiety  and depression 04/12/2015   Asthma, mild intermittent 04/12/2015   Bladder polyp 04/12/2015   Carpal tunnel syndrome 04/12/2015   Binocular vision disorder with diplopia 04/12/2015   Dyslipidemia 04/12/2015   H/O: HTN (hypertension) 04/12/2015   Hemorrhoids, internal 04/12/2015   Neuropathy 04/12/2015   Nuclear sclerotic cataract 04/12/2015   Morbid obesity ([place]) 04/12/2015   Obstructive apnea 04/12/2015   Perennial allergic rhinitis 04/12/2015   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/12/2015   Type 2 diabetes, controlled, with neuropathy ([place]) 04/12/2015   Vitamin D deficiency 04/12/2015   Cephalalgia 08/16/2014   Lump or  mass in breast 06/14/2013   Bariatric surgery status 10/12/2012    Past Surgical History:  Procedure Laterality Date   ABDOMINAL HYSTERECTOMY  1991   BARIATRIC SURGERY  2014   CARPAL TUNNEL RELEASE Left 06/08/2019   Procedure: LEFT CARPAL TUNNEL RELEASE;  Surgeon: Mcarthur Rossetti, MD;  Location: [place];  Service: Orthopedics;  Laterality: Left;   CARPAL TUNNEL RELEASE Right 08/19/2019   Procedure: RIGHT CARPAL TUNNEL RELEASE;  Surgeon: Mcarthur Rossetti, MD;  Location: [place];  Service: Orthopedics;  Laterality: Right;   CATARACT EXTRACTION W/PHACO Left 01/28/2017   Procedure: CATARACT EXTRACTION PHACO AND INTRAOCULAR LENS PLACEMENT (IOC);  Surgeon: Birder Robson, MD;  Location: [facility];  Service: Ophthalmology;  Laterality: Left;  [place] 00:37 AP% 17.8 CDE 6.61 Fluid pack lot # [id number]   COLONOSCOPY  2012   COLONOSCOPY WITH PROPOFOL N/A 09/30/2016   Procedure: COLONOSCOPY WITH PROPOFOL;  Surgeon: Lucilla Lame, MD;  Location: [place];  Service: Endoscopy;  Laterality: N/A;  diabetic - diet controlled sleep apnea   KNEE SURGERY Right 1979-1980   OOPHORECTOMY     PARS PLANA VITRECTOMY Left 03/27/2016   Procedure: PARS PLANA VITRECTOMY WITH 25 GAUGE AND MEMBRANE PEEL, INTERNAL LIMITING MEMBRANE;  Surgeon: Hurman Horn, MD;  Location: [place];  Service: Ophthalmology;  Laterality: Left;   THYROID SURGERY     BX done was benign   UPPER GI ENDOSCOPY     VITRECTOMY Left 04/27/2016    Family History  Problem Relation Age of Onset   Diabetes Mother    Hypertension Mother    Heart disease Mother 50       stent x 1    Migraines Sister    Stroke Maternal Grandfather    Diabetes Paternal Grandfather    Muscular dystrophy Sister    Heart attack Maternal Aunt    Breast cancer Neg Hx    Thyroid disease Neg Hx     Social History   Tobacco Use   Smoking status: Former    Packs/day: 0.50    Years: 3.00    Pack years: 1.50     Types: Cigarettes    Start date: 06/24/1972    Quit date: 06/25/1975    Years since quitting: 45.6   Smokeless tobacco: Never   Tobacco comments:    smoked as teenager  Substance Use Topics   Alcohol use: No    Alcohol/week: 0.0 standard drinks     Current Outpatient Medications:    acetaminophen (TYLENOL) 500 MG tablet, Take 1,000 mg by mouth every 6 (six) hours as needed for mild pain. Reported on 07/06/2015, Disp: , Rfl:    albuterol (VENTOLIN HFA) 108 (90 Base) MCG/ACT inhaler, INHALE 2 PUFFS BY MOUTH EVERY 4 HOURS AS NEEDED FOR WHEEZING OR SHORTNESS OF BREATH., Disp: 18 g, Rfl: 0   Blood Glucose Monitoring Suppl (FREESTYLE  FREEDOM LITE) w/Device KIT, USE AS DIRECTED, Disp: 1 kit, Rfl: 0   brimonidine-timolol (COMBIGAN) 0.2-0.5 % ophthalmic solution, 1 drop daily. , Disp: , Rfl:    budesonide (RHINOCORT AQUA) 32 MCG/ACT nasal spray, Place 2 sprays into both nostrils at bedtime., Disp: 15 mL, Rfl: 2   Coenzyme Q10 (CO Q 10) 100 MG CAPS, Take 1 capsule by mouth daily., Disp: 30 capsule, Rfl: 2   DULoxetine (CYMBALTA) 60 MG capsule, TAKE 1 CAPSULE (60 MG TOTAL) BY MOUTH DAILY., Disp: 90 capsule, Rfl: 1   fluticasone furoate-vilanterol (BREO ELLIPTA) 100-25 MCG/INH AEPB, Inhale 1 puff into the lungs daily., Disp: 60 each, Rfl: 0   latanoprost (XALATAN) 0.005 % ophthalmic solution, Place 1 drop into the left eye at bedtime. , Disp: , Rfl:    loratadine (CLARITIN) 10 MG tablet, Take 1 tablet (10 mg total) by mouth 2 (two) times daily., Disp: 60 tablet, Rfl: 0   metFORMIN (GLUCOPHAGE-XR) 750 MG 24 hr tablet, Take 1 tablet (750 mg total) by mouth daily with breakfast., Disp: 180 tablet, Rfl: 1   montelukast (SINGULAIR) 10 MG tablet, TAKE 1 TABLET (10 MG TOTAL) BY MOUTH AT BEDTIME., Disp: 90 tablet, Rfl: 1   Multiple Vitamins-Minerals (MULTIVITAMIN WITH MINERALS) tablet, Take 1 tablet by mouth daily., Disp: , Rfl:    Omega-3 Fatty Acids (FISH OIL) 1200 MG CAPS, Take 2 capsules by mouth daily., Disp:  , Rfl:    pyridostigmine (MESTINON) 60 MG tablet, Take 1 tablet at 8a, noona, and 4p.  OK to take extra dose as needed., Disp: 360 tablet, Rfl: 3   rosuvastatin (CRESTOR) 5 MG tablet, TAKE 1 TABLET (5 MG TOTAL) BY MOUTH DAILY., Disp: 90 tablet, Rfl: 1   telmisartan-hydrochlorothiazide (MICARDIS HCT) 80-25 MG tablet, Take 1 tablet by mouth daily., Disp: 90 tablet, Rfl: 1  Allergies  Allergen Reactions   Aspirin Swelling    Upset stomach  Upset stomach    Azithromycin     Contraindicated in Myasthenia    Ace Inhibitors     cough   Amlodipine     Edema of feet and hands   Gabapentin     anxiety   Pregabalin Other (See Comments)    anxiety    I personally reviewed active problem list, medication list, allergies, family history, social history, health maintenance with the patient/caregiver today.   ROS  Constitutional: Negative for fever or weight change.  Respiratory: Negative for cough and shortness of breath.   Cardiovascular: Negative for chest pain or palpitations.  Gastrointestinal: Negative for abdominal pain, no bowel changes.  Musculoskeletal: Negative for gait problem or joint swelling.  Skin: Negative for rash.  Neurological: Negative for dizziness or headache.  No other specific complaints in a complete review of systems (except as listed in HPI above).   Objective  Vitals:   01/29/21 1046  BP: 120/80  Pulse: 66  Resp: 16  Temp: 98.6 F (37 C)  SpO2: 98%  Weight: 246 lb (111.6 kg)  Height: '5\' 3"'  (1.6 m)    Body mass index is 43.58 kg/m.  Physical Exam  Constitutional: Patient appears well-developed and well-nourished. Obese  No distress.  HEENT: head atraumatic, normocephalic, pupils equal and reactive to light,neck supple Cardiovascular: Normal rate, regular rhythm and normal heart sounds.  No murmur heard. No BLE edema. Pulmonary/Chest: Effort normal and breath sounds normal. No respiratory distress. Abdominal: Soft.  There is no  tenderness. Psychiatric: Patient has a normal mood and affect. behavior is normal. Judgment and  thought content normal.   Recent Results (from the past 2160 hour(s))  HM DIABETES EYE EXAM     Status: None   Collection Time: 12/18/20 12:00 AM  Result Value Ref Range   HM Diabetic Eye Exam No Retinopathy No Retinopathy  POCT HgB A1C     Status: Abnormal   Collection Time: 01/29/21 10:48 AM  Result Value Ref Range   Hemoglobin A1C 5.7 (A) 4.0 - 5.6 %   HbA1c POC (<> result, manual entry)     HbA1c, POC (prediabetic range)     HbA1c, POC (controlled diabetic range)     PHQ2/9: Depression screen [facility] 2/9 01/29/2021 12/22/2020 09/29/2020 07/31/2020 03/29/2020  Decreased Interest 0 0 0 0 0  Down, Depressed, Hopeless 0 0 0 0 0  PHQ - 2 Score 0 0 0 0 0  Altered sleeping - - - - -  Tired, decreased energy - - - - -  Change in appetite - - - - -  Feeling bad or failure about yourself  - - - - -  Trouble concentrating - - - - -  Moving slowly or fidgety/restless - - - - -  Suicidal thoughts - - - - -  PHQ-9 Score - - - - -  Difficult doing work/chores - - - - -  Some recent data might be hidden    phq 9 is negative    Fall Risk: Fall Risk  01/29/2021 12/22/2020 09/29/2020 09/15/2020 07/31/2020  Falls in the past year? 0 0 '1 1 1  ' Number falls in past yr: 0 0 '1 1 1  ' Injury with Fall? 0 0 0 0 1  Risk for fall due to : - - - - History of fall(s)  Follow up - Falls evaluation completed - - Education provided     Functional Status Survey: Is the patient deaf or have difficulty hearing?: No Does the patient have difficulty seeing, even when wearing glasses/contacts?: No Does the patient have difficulty concentrating, remembering, or making decisions?: No Does the patient have difficulty walking or climbing stairs?: No Does the patient have difficulty dressing or bathing?: No Does the patient have difficulty doing errands alone such as visiting a doctor's office or shopping?: No    Assessment &  Plan  1. Type 2 diabetes, controlled, with neuropathy (HCC)  - POCT HgB A1C - DULoxetine (CYMBALTA) 60 MG capsule; TAKE 1 CAPSULE (60 MG TOTAL) BY MOUTH DAILY.  Dispense: 90 capsule; Refill: 1 - metFORMIN (GLUCOPHAGE-XR) 750 MG 24 hr tablet; Take 1 tablet (750 mg total) by mouth daily with breakfast.  Dispense: 180 tablet; Refill: 1  2. Obstructive apnea   3. Mild persistent asthma without complication   4. Vitamin D deficiency   5. History of bariatric surgery   6. Morbid obesity, unspecified obesity type [facility])  Discussed with the patient the risk posed by an increased BMI. Discussed importance of portion control, calorie counting and at least 150 minutes of physical activity weekly. Avoid sweet beverages and drink more water. Eat at least 6 servings of fruit and vegetables daily    7. Dyslipidemia associated with type 2 diabetes mellitus ([place])   8. Seronegative myasthenia gravis ([place])   9. Hypertension, benign  - telmisartan-hydrochlorothiazide (MICARDIS HCT) 80-25 MG tablet; Take 1 tablet by mouth daily.  Dispense: 90 tablet; Refill: 1  10. Multinodular goiter   11. Multiple thyroid nodules   12. Peripheral polyneuropathy  - DULoxetine (CYMBALTA) 60 MG capsule; TAKE 1 CAPSULE (60 MG TOTAL)  BY MOUTH DAILY.  Dispense: 90 capsule; Refill: 1  13. Dyslipidemia  - rosuvastatin (CRESTOR) 5 MG tablet; TAKE 1 TABLET (5 MG TOTAL) BY MOUTH DAILY.  Dispense: 90 tablet; Refill: 1  14. Diabetes mellitus with coincident hypertension (HCC)  - telmisartan-hydrochlorothiazide (MICARDIS HCT) 80-25 MG tablet; Take 1 tablet by mouth daily.  Dispense: 90 tablet; Refill: 1

## 2021-01-29 ENCOUNTER — Ambulatory Visit (INDEPENDENT_AMBULATORY_CARE_PROVIDER_SITE_OTHER): Payer: Medicare Other | Admitting: Family Medicine

## 2021-01-29 ENCOUNTER — Encounter: Payer: Self-pay | Admitting: Family Medicine

## 2021-01-29 ENCOUNTER — Other Ambulatory Visit: Payer: Self-pay

## 2021-01-29 VITALS — BP 120/80 | HR 66 | Temp 98.6°F | Resp 16 | Ht 63.0 in | Wt 246.0 lb

## 2021-01-29 DIAGNOSIS — G4733 Obstructive sleep apnea (adult) (pediatric): Secondary | ICD-10-CM | POA: Diagnosis not present

## 2021-01-29 DIAGNOSIS — E785 Hyperlipidemia, unspecified: Secondary | ICD-10-CM

## 2021-01-29 DIAGNOSIS — E1140 Type 2 diabetes mellitus with diabetic neuropathy, unspecified: Secondary | ICD-10-CM | POA: Diagnosis not present

## 2021-01-29 DIAGNOSIS — E119 Type 2 diabetes mellitus without complications: Secondary | ICD-10-CM

## 2021-01-29 DIAGNOSIS — J4530 Mild persistent asthma, uncomplicated: Secondary | ICD-10-CM | POA: Diagnosis not present

## 2021-01-29 DIAGNOSIS — E1169 Type 2 diabetes mellitus with other specified complication: Secondary | ICD-10-CM

## 2021-01-29 DIAGNOSIS — I10 Essential (primary) hypertension: Secondary | ICD-10-CM

## 2021-01-29 DIAGNOSIS — E559 Vitamin D deficiency, unspecified: Secondary | ICD-10-CM

## 2021-01-29 DIAGNOSIS — E042 Nontoxic multinodular goiter: Secondary | ICD-10-CM

## 2021-01-29 DIAGNOSIS — Z9884 Bariatric surgery status: Secondary | ICD-10-CM

## 2021-01-29 DIAGNOSIS — G629 Polyneuropathy, unspecified: Secondary | ICD-10-CM

## 2021-01-29 DIAGNOSIS — G7000 Myasthenia gravis without (acute) exacerbation: Secondary | ICD-10-CM

## 2021-01-29 LAB — POCT GLYCOSYLATED HEMOGLOBIN (HGB A1C): Hemoglobin A1C: 5.7 % — AB (ref 4.0–5.6)

## 2021-01-29 MED ORDER — METFORMIN HCL ER 750 MG PO TB24
750.0000 mg | ORAL_TABLET | Freq: Every day | ORAL | 1 refills | Status: DC
Start: 2021-01-29 — End: 2021-07-20

## 2021-01-29 MED ORDER — TELMISARTAN-HCTZ 80-25 MG PO TABS: 1.0000 | ORAL_TABLET | Freq: Every day | ORAL | 1 refills | Status: DC

## 2021-01-29 MED ORDER — ROSUVASTATIN CALCIUM 5 MG PO TABS: ORAL_TABLET | Freq: Every day | ORAL | 1 refills | Status: DC

## 2021-01-29 MED ORDER — DULOXETINE HCL 60 MG PO CPEP: ORAL_CAPSULE | Freq: Every day | ORAL | 1 refills | Status: DC

## 2021-02-17 IMAGING — MG DIGITAL SCREENING BILATERAL MAMMOGRAM WITH TOMO AND CAD
8 of 15 series · 8 of 40 positions shown · non-contrast
Comparison: Previous exam(s).

CLINICAL DATA: Screening.

EXAM:
DIGITAL SCREENING BILATERAL MAMMOGRAM WITH TOMO AND CAD

[R CC synth-2D (1 of 2)]
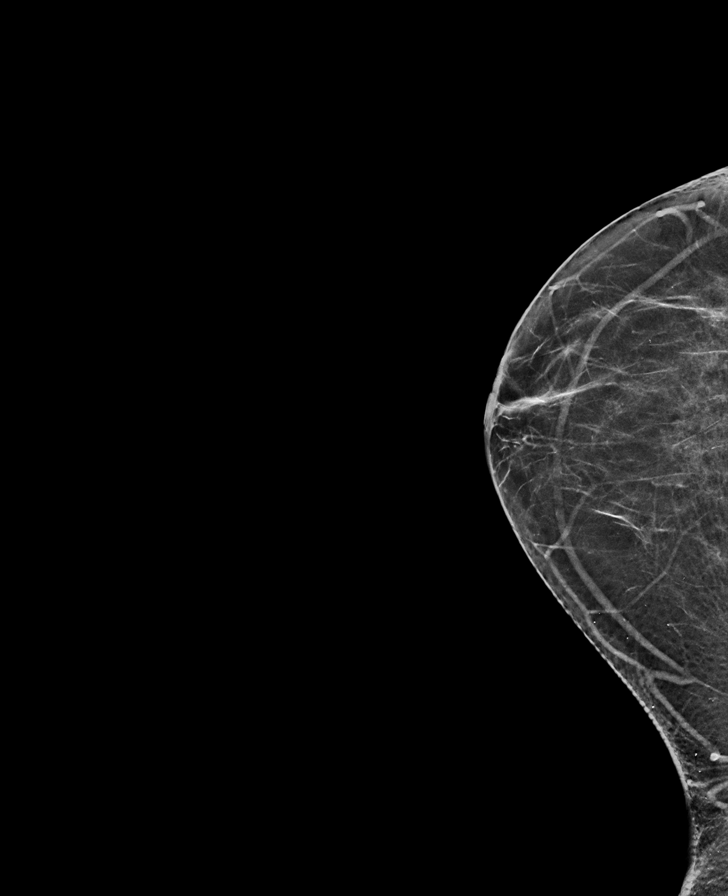

[L CC synth-2D (1 of 2)]
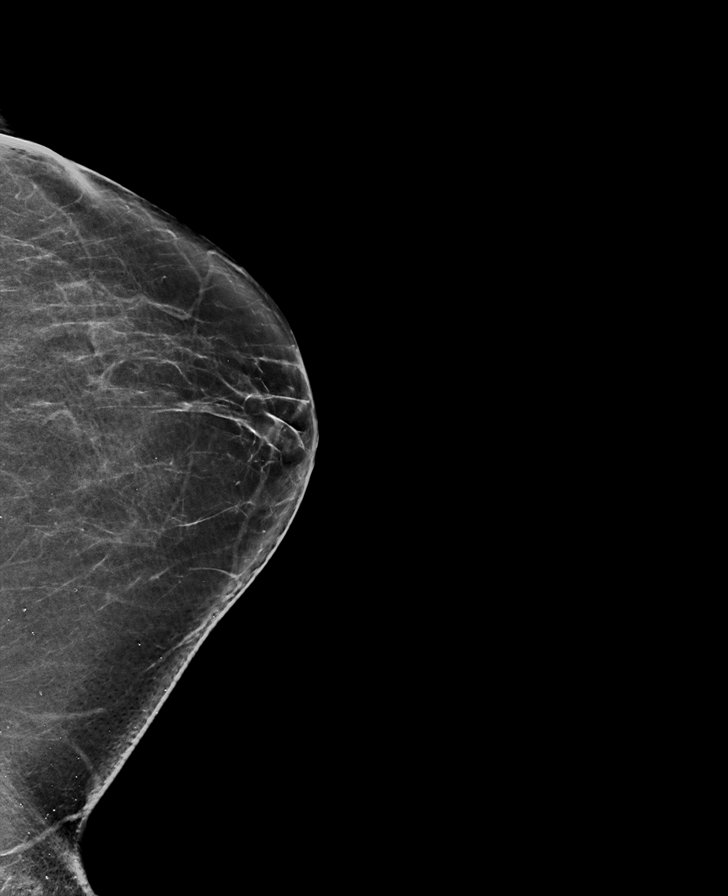

[L CC synth-2D (2 of 2)]
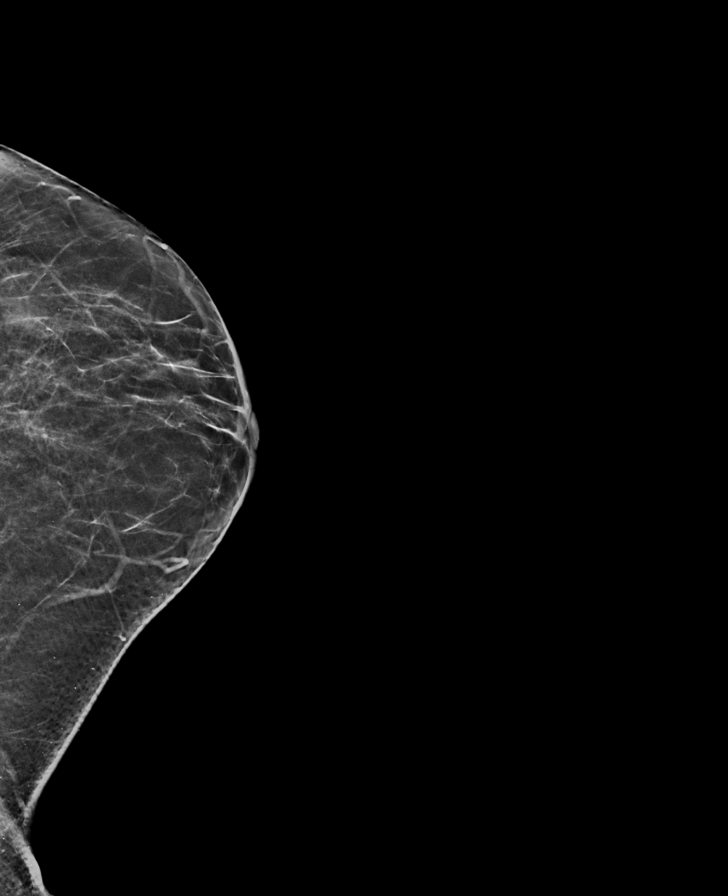

[R MLO synth-2D]
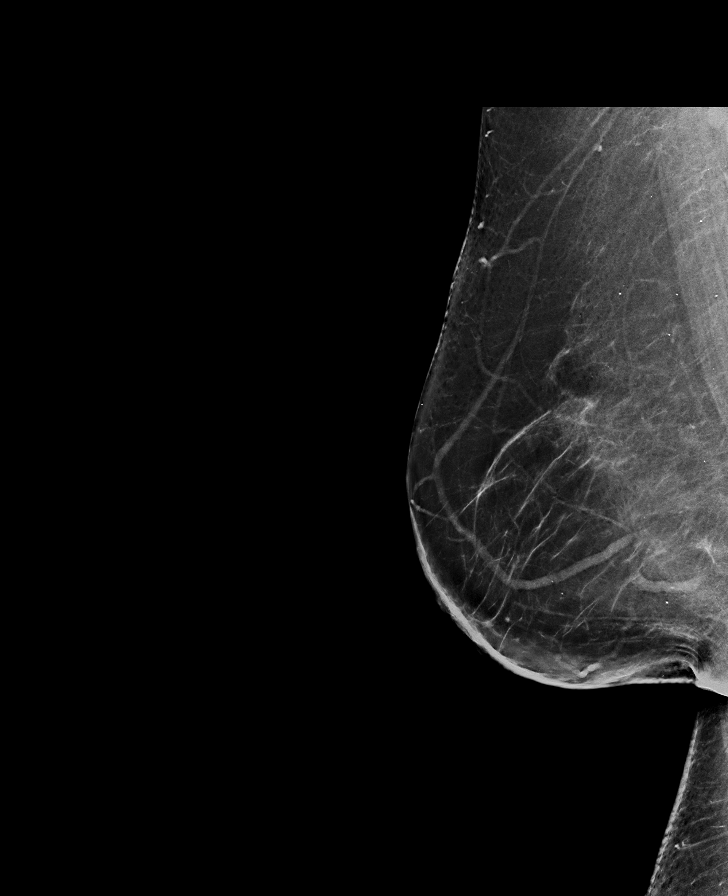

[L MLO synth-2D (1 of 2)]
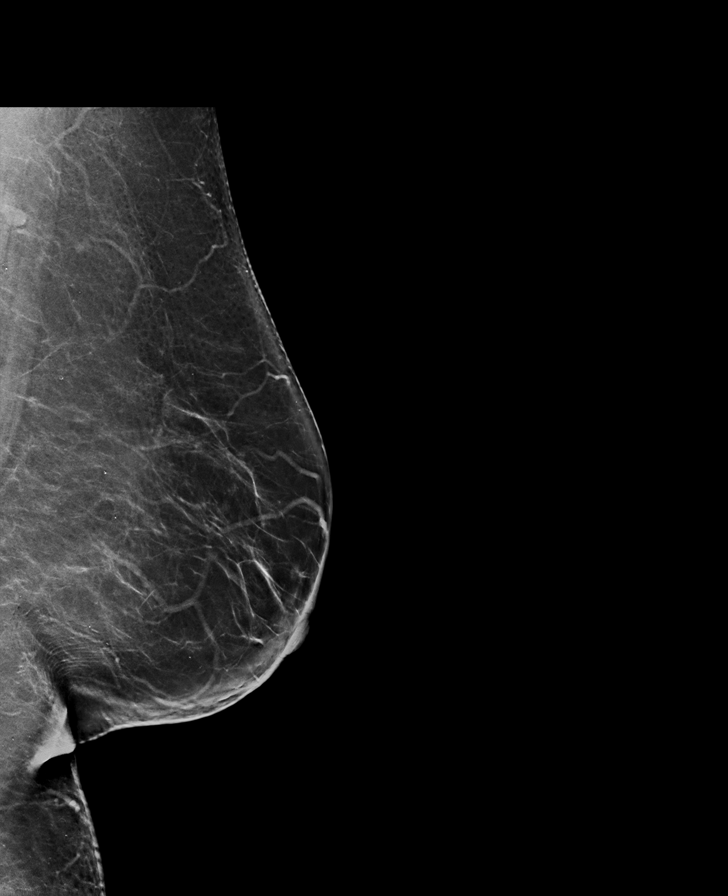

[L MLO synth-2D (2 of 2)]
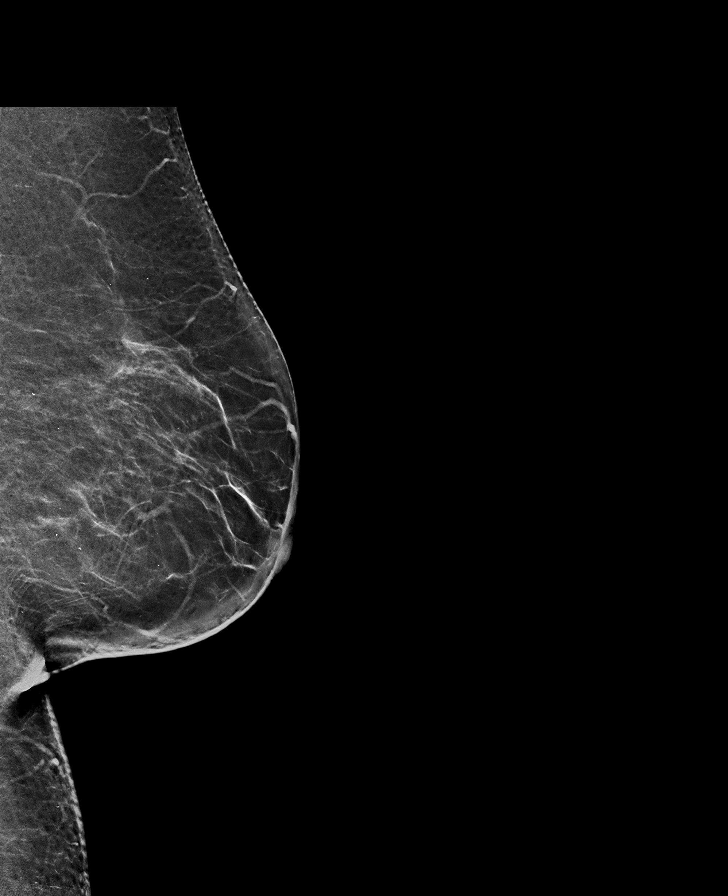

[R CC synth-2D (2 of 2)]
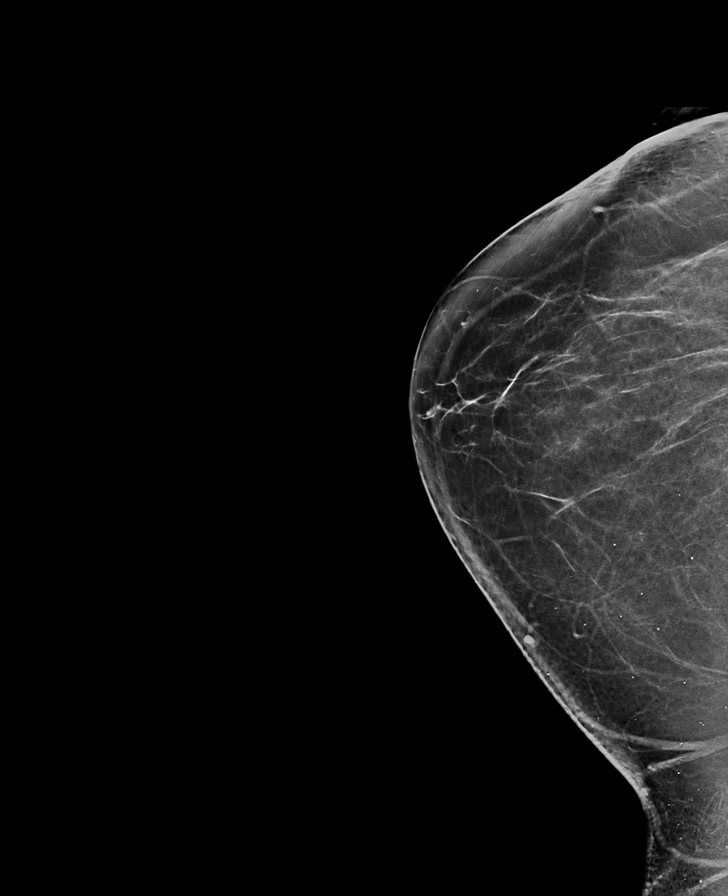

[R CC tomo · tomo slice 38/55.0]
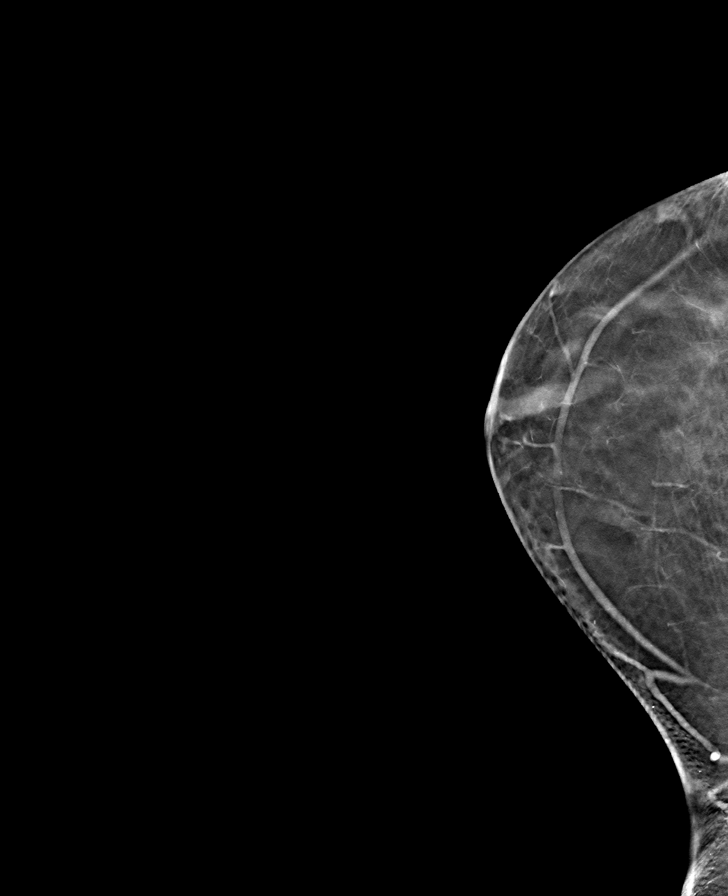

[8 of 40 positions shown; findings below may reference images not displayed]

ACR Breast Density Category b: There are scattered areas of
fibroglandular density.
FINDINGS: There are no findings suspicious for malignancy. Images were
processed with CAD.
IMPRESSION: No mammographic evidence of malignancy. A result letter of this
screening mammogram will be mailed directly to the patient.

RECOMMENDATION:
Screening mammogram in one year. (Code:[id number])

BI-RADS CATEGORY  1: Negative.

## 2021-04-08 ENCOUNTER — Other Ambulatory Visit: Payer: Self-pay | Admitting: Family Medicine

## 2021-04-08 DIAGNOSIS — J3089 Other allergic rhinitis: Secondary | ICD-10-CM

## 2021-04-08 NOTE — Telephone Encounter (Signed)
Requested Prescriptions  Pending Prescriptions Disp Refills  . montelukast (SINGULAIR) 10 MG tablet [Pharmacy Med Name: MONTELUKAST SOD 10 MG TABLET] 90 tablet 1    Sig: TAKE 1 TABLET BY MOUTH EVERYDAY AT BEDTIME     Pulmonology:  Leukotriene Inhibitors Passed - 04/08/2021 10:26 AM      Passed - Valid encounter within last 12 months    Recent Outpatient Visits          2 months ago Type 2 diabetes, controlled, with neuropathy [facility])   [facility] Steele Sizer, MD   3 months ago COVID-5   [facility], Drue Stager, MD   6 months ago Type 2 diabetes, controlled, with neuropathy [facility])   [facility] Steele Sizer, MD   8 months ago [facility] [employer] preventive visit   [facility] Steele Sizer, MD   1 year ago Type 2 diabetes, controlled, with neuropathy [facility])   [facility] Steele Sizer, MD      Future Appointments            In 1 month Ancil Boozer, Drue Stager, MD [facility], [facility]

## 2021-05-29 NOTE — Progress Notes (Signed)
Name: Jasmine Woods   MRN: [id number]    DOB: [birth year]   Date:05/30/2021       Progress Note  Subjective  Chief Complaint  Follow Up  HPI  DMII:  She was off medication after bariatric surgery in 2014, but we re-started medication 02/3266 - Trulicity, however since she is now on Medicare and because of the cost  we  switch back to Metformin., she has gained weight since we stopped Trulicity    TIWP8K has been well controlled, tody is 5.7 % it was 5.8 % in April  today is 5.8 % again . Neuropathy is still present , constant, burning- like . She is on Cymbalta for pain  control . She could not tolerate Gabapentin or Lyrica and also did not work. She also has associated HTN, obesity and dyslipidemia. She is going to check cost of Trulicity , she feels better on medication   HTN: she is on Micardis 80/25 mg, bp is slightly higher , likely from weight gain, no chest pain, palpitation . She denies orthostatic changes    Hyperlipidemia: taking Crestor most nights, occastionally forgets to take it.  No myalgia, goal LDL is below 70. Discussed adding Zetia today    Asthma Mild Intermittent : she was seeing  Dr. Felicie Morn and PFT, she was placed on Breo but currently only on allergies medications and uses Ventolin very seldom but not recently , she has SOB with activity that may be multifactorial. Last flare was during COVID 11/2020 . She states noticing more SOB lately and likely due to weight gain    Myasthenia Gravis: diagnosed at [facility] by Dr. Burnett Harry  09/2015, seronegative, only causing ptosis and double vision, no other symptoms. She had a negative chest CT for thymoma, and has started on medication ( Mestinon ) She is now seeing Dr. Posey Pronto, [facility] Neurology, stable symptoms     Abnormal CT thyroid: incidental finding of thyroid calcification of left lobe, she saw Endo and had negative biopsy.  She will go back in 2023 for repeat labs and [place] - Dr. Loanne Drilling . Unchanged    Bariatric  Surgery/Obesity: she had bariatric surgery back in 2014 , her weight was almost 300 lbs, went down to close to 200lbs, but was  gradually gaining weight,she was started on Trulicity 99/8338 and lost 15 lbs , but is gaining it back, she was 241 lbs July 2020 and we started [place], she had lost 11 lbs on the medication ( 229 lbs ), but ran out of medication and gained it back,  we gave a new rx Feb 2021 she titrated dose up, but there a gap with PA and she had to re-start medication April  2021 ,weight went from  233 lbs to 229 8 lbs, but gradually gaining weight again, last weight was to 246 lbs, today weigh is  up to 261 lbs , she is willing to resume Trulicity if not too expensive.    OSA: she is compliant with CPAP, she has not been back to pulmonologist since Dr. Felicie Morn left the practice , she has auto pap and pressure seems okay   Stress: she has been helping her son in law, he has bipolar disorder and also bulimia and she has been taking him to doctors visits multiple times a week. She states she has been going out to eat frequently because of that. He has attempted suicide   Patient Active Problem List   Diagnosis Date Noted   Carpal tunnel syndrome,  right upper limb 08/19/2019   Carpal tunnel syndrome, left upper limb 06/08/2019   Multinodular goiter 11/25/2018   Essential hypertension 11/11/2018   DOE (dyspnea on exertion) 04/15/2018   Hyperlipidemia 12/09/2017   Diaphoresis 11/26/2017   Special screening for malignant neoplasms, colon    Epiretinal membrane (ERM) of left eye 09/09/2016   Glaucoma, left eye 09/05/2016   Seronegative myasthenia gravis ([place]) 08/08/2015   Psoriasis 07/06/2015   Stress incontinence 07/06/2015   Chronic tension-type headache, intractable 05/29/2015   Anxiety and depression 04/12/2015   Asthma, mild intermittent 04/12/2015   Bladder polyp 04/12/2015   Carpal tunnel syndrome 04/12/2015   Binocular vision disorder with diplopia 04/12/2015    Dyslipidemia 04/12/2015   H/O: HTN (hypertension) 04/12/2015   Hemorrhoids, internal 04/12/2015   Neuropathy 04/12/2015   Nuclear sclerotic cataract 04/12/2015   Morbid obesity ([place]) 04/12/2015   Obstructive apnea 04/12/2015   Perennial allergic rhinitis 04/12/2015   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/12/2015   Type 2 diabetes, controlled, with neuropathy ([place]) 04/12/2015   Vitamin D deficiency 04/12/2015   Cephalalgia 08/16/2014   Lump or mass in breast 06/14/2013   Bariatric surgery status 10/12/2012    Past Surgical History:  Procedure Laterality Date   ABDOMINAL HYSTERECTOMY  1991   BARIATRIC SURGERY  2014   CARPAL TUNNEL RELEASE Left 06/08/2019   Procedure: LEFT CARPAL TUNNEL RELEASE;  Surgeon: Mcarthur Rossetti, MD;  Location: [place];  Service: Orthopedics;  Laterality: Left;   CARPAL TUNNEL RELEASE Right 08/19/2019   Procedure: RIGHT CARPAL TUNNEL RELEASE;  Surgeon: Mcarthur Rossetti, MD;  Location: [place];  Service: Orthopedics;  Laterality: Right;   CATARACT EXTRACTION W/PHACO Left 01/28/2017   Procedure: CATARACT EXTRACTION PHACO AND INTRAOCULAR LENS PLACEMENT (IOC);  Surgeon: Birder Robson, MD;  Location: [facility];  Service: Ophthalmology;  Laterality: Left;  [place] 00:37 AP% 17.8 CDE 6.61 Fluid pack lot # [id number]   COLONOSCOPY  2012   COLONOSCOPY WITH PROPOFOL N/A 09/30/2016   Procedure: COLONOSCOPY WITH PROPOFOL;  Surgeon: Lucilla Lame, MD;  Location: [place];  Service: Endoscopy;  Laterality: N/A;  diabetic - diet controlled sleep apnea   KNEE SURGERY Right 1979-1980   OOPHORECTOMY     PARS PLANA VITRECTOMY Left 03/27/2016   Procedure: PARS PLANA VITRECTOMY WITH 25 GAUGE AND MEMBRANE PEEL, INTERNAL LIMITING MEMBRANE;  Surgeon: Hurman Horn, MD;  Location: [place];  Service: Ophthalmology;  Laterality: Left;   THYROID SURGERY     BX done was benign   UPPER GI ENDOSCOPY     VITRECTOMY Left  04/27/2016    Family History  Problem Relation Age of Onset   Diabetes Mother    Hypertension Mother    Heart disease Mother 36       stent x 1    Migraines Sister    Stroke Maternal Grandfather    Diabetes Paternal Grandfather    Muscular dystrophy Sister    Heart attack Maternal Aunt    Breast cancer Neg Hx    Thyroid disease Neg Hx     Social History   Tobacco Use   Smoking status: Former    Packs/day: 0.50    Years: 3.00    Pack years: 1.50    Types: Cigarettes    Start date: 06/24/1972    Quit date: 06/25/1975    Years since quitting: 45.9   Smokeless tobacco: Never   Tobacco comments:    smoked as teenager  Substance  Use Topics   Alcohol use: No    Alcohol/week: 0.0 standard drinks     Current Outpatient Medications:    acetaminophen (TYLENOL) 500 MG tablet, Take 1,000 mg by mouth every 6 (six) hours as needed for mild pain. Reported on 07/06/2015, Disp: , Rfl:    albuterol (VENTOLIN HFA) 108 (90 Base) MCG/ACT inhaler, INHALE 2 PUFFS BY MOUTH EVERY 4 HOURS AS NEEDED FOR WHEEZING OR SHORTNESS OF BREATH., Disp: 18 g, Rfl: 0   brimonidine-timolol (COMBIGAN) 0.2-0.5 % ophthalmic solution, 1 drop daily. , Disp: , Rfl:    budesonide (RHINOCORT AQUA) 32 MCG/ACT nasal spray, Place 2 sprays into both nostrils at bedtime., Disp: 15 mL, Rfl: 2   Coenzyme Q10 (CO Q 10) 100 MG CAPS, Take 1 capsule by mouth daily., Disp: 30 capsule, Rfl: 2   DULoxetine (CYMBALTA) 60 MG capsule, TAKE 1 CAPSULE (60 MG TOTAL) BY MOUTH DAILY., Disp: 90 capsule, Rfl: 1   latanoprost (XALATAN) 0.005 % ophthalmic solution, Place 1 drop into the left eye at bedtime. , Disp: , Rfl:    loratadine (CLARITIN) 10 MG tablet, Take 1 tablet (10 mg total) by mouth 2 (two) times daily., Disp: 60 tablet, Rfl: 0   metFORMIN (GLUCOPHAGE-XR) 750 MG 24 hr tablet, Take 1 tablet (750 mg total) by mouth daily with breakfast., Disp: 180 tablet, Rfl: 1   montelukast (SINGULAIR) 10 MG tablet, TAKE 1 TABLET BY MOUTH EVERYDAY AT  BEDTIME, Disp: 90 tablet, Rfl: 1   Multiple Vitamins-Minerals (MULTIVITAMIN WITH MINERALS) tablet, Take 1 tablet by mouth daily., Disp: , Rfl:    Omega-3 Fatty Acids (FISH OIL) 1200 MG CAPS, Take 2 capsules by mouth daily., Disp: , Rfl:    pyridostigmine (MESTINON) 60 MG tablet, Take 1 tablet at 8a, noona, and 4p.  OK to take extra dose as needed., Disp: 360 tablet, Rfl: 3   rosuvastatin (CRESTOR) 5 MG tablet, TAKE 1 TABLET (5 MG TOTAL) BY MOUTH DAILY., Disp: 90 tablet, Rfl: 1   telmisartan-hydrochlorothiazide (MICARDIS HCT) 80-25 MG tablet, Take 1 tablet by mouth daily., Disp: 90 tablet, Rfl: 1  Allergies  Allergen Reactions   Aspirin Swelling    Upset stomach  Upset stomach    Azithromycin     Contraindicated in Myasthenia    Ace Inhibitors     cough   Amlodipine     Edema of feet and hands   Gabapentin     anxiety   Pregabalin Other (See Comments)    anxiety    I personally reviewed active problem list, medication list, allergies, family history, social history, health maintenance with the patient/caregiver today.   ROS  Constitutional: Negative for fever or weight change.  Respiratory: Negative for cough and shortness of breath.   Cardiovascular: Negative for chest pain or palpitations.  Gastrointestinal: Negative for abdominal pain, no bowel changes.  Musculoskeletal: Negative for gait problem or joint swelling.  Skin: Negative for rash.  Neurological: Negative for dizziness or headache.  No other specific complaints in a complete review of systems (except as listed in HPI above).   Objective  Vitals:   05/30/21 0958  BP: 132/78  Pulse: 73  Resp: 16  Temp: 98.4 F (36.9 C)  SpO2: 98%  Weight: 261 lb (118.4 kg)  Height: 5\' 3"  (1.6 m)    Body mass index is 46.23 kg/m.  Physical Exam  Constitutional: Patient appears well-developed and well-nourished. Obese  No distress.  HEENT: head atraumatic, normocephalic, pupils equal and reactive to light,neck  supple,  Cardiovascular: Normal rate, regular rhythm and normal heart sounds.  No murmur heard. No BLE edema. Pulmonary/Chest: Effort normal and breath sounds normal. No respiratory distress. Abdominal: Soft.  There is no tenderness. Psychiatric: Patient has a normal mood and affect. behavior is normal. Judgment and thought content normal.    PHQ2/9: Depression screen [facility] 2/9 05/30/2021 01/29/2021 12/22/2020 09/29/2020 07/31/2020  Decreased Interest 1 0 0 0 0  Down, Depressed, Hopeless 0 0 0 0 0  PHQ - 2 Score 1 0 0 0 0  Altered sleeping 3 - - - -  Tired, decreased energy 0 - - - -  Change in appetite 0 - - - -  Feeling bad or failure about yourself  0 - - - -  Trouble concentrating 0 - - - -  Moving slowly or fidgety/restless 0 - - - -  Suicidal thoughts 0 - - - -  PHQ-9 Score 4 - - - -  Difficult doing work/chores - - - - -  Some recent data might be hidden    phq 9 is positive   Fall Risk: Fall Risk  05/30/2021 01/29/2021 12/22/2020 09/29/2020 09/15/2020  Falls in the past year? 0 0 0 1 1  Number falls in past yr: 0 0 0 1 1  Injury with Fall? 0 0 0 0 0  Risk for fall due to : No Fall Risks - - - -  Follow up Falls prevention discussed - Falls evaluation completed - -      Functional Status Survey: Is the patient deaf or have difficulty hearing?: No Does the patient have difficulty seeing, even when wearing glasses/contacts?: No Does the patient have difficulty concentrating, remembering, or making decisions?: No Does the patient have difficulty walking or climbing stairs?: No Does the patient have difficulty dressing or bathing?: No Does the patient have difficulty doing errands alone such as visiting a doctor's office or shopping?: No    Assessment & Plan  1. Type 2 diabetes, controlled, with neuropathy (HCC)  - POCT HgB A1C - HM Diabetes Foot Exam - Dulaglutide (TRULICITY) 5.72 IO/0.3TD SOPN; Inject 0.75 mg into the skin once a week.  Dispense: 2 mL; Refill: 0  2.  Seronegative myasthenia gravis ([place])  Keep follow up with neurologist   3. Dyslipidemia associated with type 2 diabetes mellitus ([place])   4. Dyslipidemia  - rosuvastatin (CRESTOR) 20 MG tablet; Take 1 tablet (20 mg total) by mouth daily.  Dispense: 90 tablet; Refill: 1  5. Diabetes mellitus with coincident hypertension ([place])   6. History of bariatric surgery   7. Vitamin D deficiency   8. Morbid obesity ([place])  Discussed with the patient the risk posed by an increased BMI. Discussed importance of portion control, calorie counting and at least 150 minutes of physical activity weekly. Avoid sweet beverages and drink more water. Eat at least 6 servings of fruit and vegetables daily    9. Multiple thyroid nodules   10. Hypertension, benign   11. Peripheral polyneuropathy   12. Obstructive apnea  - Ambulatory referral to Pulmonology  13. Mild persistent asthma without complication  - Ambulatory referral to Pulmonology   14. Stress due to illness of family member  Discussed boundaries, scheduled days to take son in law to appointment

## 2021-05-30 ENCOUNTER — Ambulatory Visit (INDEPENDENT_AMBULATORY_CARE_PROVIDER_SITE_OTHER): Payer: Medicare Other | Admitting: Family Medicine

## 2021-05-30 ENCOUNTER — Encounter: Payer: Self-pay | Admitting: Family Medicine

## 2021-05-30 VITALS — BP 132/78 | HR 73 | Temp 98.4°F | Resp 16 | Ht 63.0 in | Wt 261.0 lb

## 2021-05-30 DIAGNOSIS — E785 Hyperlipidemia, unspecified: Secondary | ICD-10-CM

## 2021-05-30 DIAGNOSIS — E1169 Type 2 diabetes mellitus with other specified complication: Secondary | ICD-10-CM

## 2021-05-30 DIAGNOSIS — E1140 Type 2 diabetes mellitus with diabetic neuropathy, unspecified: Secondary | ICD-10-CM

## 2021-05-30 DIAGNOSIS — G629 Polyneuropathy, unspecified: Secondary | ICD-10-CM

## 2021-05-30 DIAGNOSIS — E119 Type 2 diabetes mellitus without complications: Secondary | ICD-10-CM

## 2021-05-30 DIAGNOSIS — J4530 Mild persistent asthma, uncomplicated: Secondary | ICD-10-CM

## 2021-05-30 DIAGNOSIS — G7000 Myasthenia gravis without (acute) exacerbation: Secondary | ICD-10-CM

## 2021-05-30 DIAGNOSIS — Z6379 Other stressful life events affecting family and household: Secondary | ICD-10-CM

## 2021-05-30 DIAGNOSIS — E042 Nontoxic multinodular goiter: Secondary | ICD-10-CM

## 2021-05-30 DIAGNOSIS — G4733 Obstructive sleep apnea (adult) (pediatric): Secondary | ICD-10-CM

## 2021-05-30 DIAGNOSIS — E559 Vitamin D deficiency, unspecified: Secondary | ICD-10-CM

## 2021-05-30 DIAGNOSIS — Z9884 Bariatric surgery status: Secondary | ICD-10-CM

## 2021-05-30 DIAGNOSIS — I10 Essential (primary) hypertension: Secondary | ICD-10-CM

## 2021-05-30 LAB — POCT GLYCOSYLATED HEMOGLOBIN (HGB A1C): Hemoglobin A1C: 5.8 % — AB (ref 4.0–5.6)

## 2021-05-30 MED ORDER — TRULICITY 0.75 MG/0.5ML ~~LOC~~ SOAJ: 0.7500 mg | SUBCUTANEOUS | 0 refills | Status: DC

## 2021-05-30 MED ORDER — ROSUVASTATIN CALCIUM 20 MG PO TABS: 20.0000 mg | ORAL_TABLET | Freq: Every day | ORAL | 1 refills | Status: DC

## 2021-05-31 ENCOUNTER — Encounter: Payer: Self-pay | Admitting: Family Medicine

## 2021-06-24 ENCOUNTER — Other Ambulatory Visit: Payer: Self-pay | Admitting: Family Medicine

## 2021-06-24 DIAGNOSIS — E1140 Type 2 diabetes mellitus with diabetic neuropathy, unspecified: Secondary | ICD-10-CM

## 2021-06-29 ENCOUNTER — Ambulatory Visit: Payer: Medicare Other | Admitting: Adult Health

## 2021-07-13 ENCOUNTER — Encounter: Payer: Self-pay | Admitting: Family Medicine

## 2021-07-16 ENCOUNTER — Other Ambulatory Visit: Payer: Self-pay

## 2021-07-16 MED ORDER — FREESTYLE FREEDOM KIT: 100.0000 | PACK | 3 refills | Status: DC

## 2021-07-17 ENCOUNTER — Other Ambulatory Visit: Payer: Self-pay

## 2021-07-20 ENCOUNTER — Other Ambulatory Visit
Admission: RE | Admit: 2021-07-20 | Discharge: 2021-07-20 | Disposition: A | Discharge status: Home / Self Care | Payer: Medicare Other | Source: Ambulatory Visit | Attending: Internal Medicine | Admitting: Internal Medicine

## 2021-07-20 ENCOUNTER — Ambulatory Visit
Admission: RE | Admit: 2021-07-20 | Discharge: 2021-07-20 | Disposition: A | Discharge status: Home / Self Care | Payer: Medicare Other | Source: Ambulatory Visit | Attending: Internal Medicine | Admitting: Internal Medicine

## 2021-07-20 ENCOUNTER — Other Ambulatory Visit: Payer: Self-pay

## 2021-07-20 ENCOUNTER — Encounter: Payer: Self-pay | Admitting: Internal Medicine

## 2021-07-20 ENCOUNTER — Ambulatory Visit (INDEPENDENT_AMBULATORY_CARE_PROVIDER_SITE_OTHER): Payer: Medicare Other | Admitting: Internal Medicine

## 2021-07-20 DIAGNOSIS — J4520 Mild intermittent asthma, uncomplicated: Secondary | ICD-10-CM

## 2021-07-20 DIAGNOSIS — J3089 Other allergic rhinitis: Secondary | ICD-10-CM | POA: Diagnosis not present

## 2021-07-20 LAB — CBC WITH DIFFERENTIAL/PLATELET
Abs Immature Granulocytes: 0.03 10*3/uL (ref 0.00–0.07)
Basophils Absolute: 0.1 10*3/uL (ref 0.0–0.1)
Basophils Relative: 1 %
Eosinophils Absolute: 0.2 10*3/uL (ref 0.0–0.5)
Eosinophils Relative: 2 %
HCT: 42 % (ref 36.0–46.0)
Hemoglobin: 13.9 g/dL (ref 12.0–15.0)
Immature Granulocytes: 0 %
Lymphocytes Relative: 28 %
Lymphs Abs: 3.3 10*3/uL (ref 0.7–4.0)
MCH: 27.7 pg (ref 26.0–34.0)
MCHC: 33.1 g/dL (ref 30.0–36.0)
MCV: 83.7 fL (ref 80.0–100.0)
Monocytes Absolute: 1.1 10*3/uL — ABNORMAL HIGH (ref 0.1–1.0)
Monocytes Relative: 10 %
Neutro Abs: 7 10*3/uL (ref 1.7–7.7)
Neutrophils Relative %: 59 %
Platelets: 374 10*3/uL (ref 150–400)
RBC: 5.02 MIL/uL (ref 3.87–5.11)
RDW: 13.3 % (ref 11.5–15.5)
WBC: 11.6 10*3/uL — ABNORMAL HIGH (ref 4.0–10.5)
nRBC: 0 % (ref 0.0–0.2)

## 2021-07-20 MED ORDER — BUDESONIDE-FORMOTEROL FUMARATE 80-4.5 MCG/ACT IN AERO: INHALATION_SPRAY | RESPIRATORY_TRACT | 12 refills | Status: DC

## 2021-07-20 NOTE — Progress Notes (Signed)
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 Since cat in [place]  Sleep: falt bed/ on cpap  SABA use: sev times a week   No obvious day to day or daytime variability or assoc excess/ purulent sputum or mucus plugs or hemoptysis or cp or chest tightness, subjective wheeze or overt   hb symptoms.   Sleeping  without nocturnal  or early am exacerbation  of respiratory  c/o's or need for noct saba. Also denies any obvious fluctuation of symptoms with weather or environmental changes or other aggravating or alleviating factors except as outlined above   No unusual exposure hx or h/o childhood pna/ asthma or knowledge of premature birth.  Current Allergies, Complete Past Medical History, Past Surgical History, Family History, and Social History were reviewed in [employer] record.  ROS  The following are not active complaints unless bolded Hoarseness, sore throat, dysphagia, dental problems, itching, sneezing,  nasal congestion or discharge of excess mucus or purulent secretions, ear ache,   fever, chills, sweats, unintended wt loss or wt gain, classically pleuritic or exertional cp,  orthopnea pnd or arm/hand swelling  or leg swelling, presyncope, palpitations, abdominal pain, anorexia, nausea,  vomiting, diarrhea  or change in bowel habits or change in bladder habits, change in stools or change in urine, dysuria, hematuria,  rash, arthralgias, visual complaints, headache, numbness, weakness or ataxia or problems with walking or coordination,  change in mood or  memory.           Past Medical History:  Diagnosis Date   Allergy    Anxiety    Asthma    Bladder polyp    Dr. Ernst Spell   Carpal tunnel syndrome    Complication of anesthesia    needed nebulizer after surgery   Depression    Diabetes mellitus without complication ([place])    Diet controlled   Double vision    seen by Dr. 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 myastenia gravis   Dyslipidemia    Frequency of urination    Glaucoma, left eye    H/O myasthenia gravis    MOSTLY AFFECTS EYES   HA (headache)    Heart murmur    followed by PCP   Hx of bariatric surgery    Hypertension    no meds   Internal hemorrhoids    Myasthenia gravis ([place])    "mostly effects eyes"   Myasthenia gravis ([place])    Neuropathy, generalized    diabetic on feet   Nuclear sclerotic cataract of both eyes    Obesity (BMI 30-39.9)    Obstructive sleep apnea on CPAP    uses cpap   Patient is  Jehov[employer]    no blood products   Rotator cuff tendonitis 07/2009   also has impingment right shoulder pain, seen by Dr. Mack Guise   Sleep apnea    Thyroid nodule    Vaginal dryness, menopausal    Vitamin D deficiency     Outpatient Medications Prior to Visit  Medication Sig Dispense Refill   acetaminophen (TYLENOL) 500 MG tablet Take 1,000 mg by mouth every 6 (six) hours as needed for mild pain. Reported on 07/06/2015     albuterol (VENTOLIN HFA) 108 (90 Base) MCG/ACT inhaler INHALE 2 PUFFS BY MOUTH EVERY 4 HOURS AS NEEDED FOR WHEEZING OR SHORTNESS OF BREATH. 18 g 0   Blood Glucose Monitoring Suppl (FREESTYLE FREEDOM) KIT 100 each by Does not apply route every 30 (thirty) days. 1 kit 3   brimonidine-timolol (COMBIGAN) 0.2-0.5 % ophthalmic solution 1 drop  daily.      budesonide (RHINOCORT AQUA) 32 MCG/ACT nasal spray Place 2 sprays into both nostrils at bedtime. 15 mL 2   Coenzyme Q10 (CO Q 10) 100 MG CAPS Take 1 capsule by mouth daily. 30 capsule 2   DULoxetine (CYMBALTA) 60 MG capsule TAKE 1 CAPSULE (60 MG TOTAL) BY MOUTH DAILY. 90 capsule 1   latanoprost (XALATAN) 0.005 % ophthalmic solution Place 1 drop into the left eye at bedtime.      loratadine (CLARITIN) 10 MG tablet Take 1 tablet (10 mg total) by mouth 2 (two) times daily. 60 tablet 0   montelukast (SINGULAIR) 10 MG tablet TAKE 1 TABLET BY MOUTH EVERYDAY AT BEDTIME 90 tablet 1   Multiple Vitamins-Minerals (MULTIVITAMIN WITH MINERALS) tablet Take 1 tablet by mouth daily.     Omega-3 Fatty Acids (FISH OIL) 1200 MG CAPS Take 2 capsules by mouth daily.     pyridostigmine (MESTINON) 60 MG tablet Take 1 tablet at 8a, noona, and 4p.  OK to take extra dose as needed. 360 tablet 3   rosuvastatin (CRESTOR) 20 MG tablet Take 1 tablet (20 mg total) by mouth daily. 90 tablet 1   telmisartan-hydrochlorothiazide (MICARDIS HCT) 80-25 MG tablet Take 1 tablet by mouth daily. 90 tablet 1   TRULICITY 0.53 ZJ/6.7HA SOPN INJECT 0.75 MG (0.5 ML) INTO THE SKIN ONCE A WEEK. 2 mL 1   metFORMIN (GLUCOPHAGE-XR) 750 MG 24 hr tablet Take 1 tablet (750 mg total) by mouth daily with breakfast. 180 tablet 1   No facility-administered medications prior to visit.     Objective:     BP 112/82 (BP Location: Left Arm, Patient Position: Sitting, Cuff Size: Normal)    Pulse 89    Temp 97.7 F (36.5 C) (Oral)    Ht '5\' 2"'  (1.575 m)    Wt 255 lb 12.8 oz (116 kg)    SpO2 99%    BMI 46.79 kg/m   SpO2: 99 % RA  Pleasant morbidly obese (by BMI) wf nad   HEENT : pt wearing mask not removed for exam due to covid -19 concerns.    NECK :  without JVD/Nodes/TM/ nl carotid upstrokes bilaterally   LUNGS: no acc muscle use,  Nl contour chest which is clear to A and P bilaterally without cough on insp or exp maneuvers   CV:   RRR  no s3 or murmur or increase in P2, and no edema   ABD: obese  soft and nontender with nl inspiratory excursion in the supine position. No bruits or organomegaly appreciated, bowel sounds nl  MS:  Nl gait/ ext warm  without deformities, calf tenderness, cyanosis or clubbing No obvious joint restrictions   SKIN: warm and dry without lesions    NEURO:  alert, approp, nl sensorium with  no motor or cerebellar deficits apparent.    CXR PA and Lateral:   07/20/2021 :    I personally reviewed images and agree with radiology impression as follows:   No active cardiopulmonary disease.    Labs ordered 07/20/2021  :  allergy profile      Assessment   Asthma, mild intermittent Onset in her 52s assoc with prob allergic rhinitis and pets in [place] - Spirometry 08/02/16   FEV1 2.91 (95%)  Ratio 0.87 nl f/v loop  - 07/20/2021  After extensive coaching inhaler device,  effectiveness =    75% so try symb 80 2bid prn  - Allergy profile 07/20/2021 >  Eos 0.2 /  IgE  Pending   She has relatively mild asthma with prior pfts nl so mild intermttent likely best description. Based on two studies from [place]; 20 p 1865 (2018) and 380 : p2020-30 (2019) in pts with mild asthma it is reasonable to use low dose symbicort eg 80 2bid "prn" flare in this setting but I emphasized this was only shown with symbicort and takes advantage of the rapid onset of action but is not the same as "rescue therapy" but can be stopped once the acute symptoms have resolved and the need for rescue has been minimized (< 2 x weekly)     Continue singulair 10 mg hs and f/u in 3 m, sooner prn    Perennial allergic rhinitis Added 1st gen H1 blockers per guidelines  07/20/2021  - Allergy profile 07/20/2021 >  Eos 0.2/  IgE     Advised to blow symbicort out thru nose, continue singulair and get all pets out of [place]         Each maintenance medication was reviewed in detail including emphasizing most importantly the difference between  maintenance and prns and under what circumstances the prns are to be triggered using an action plan format where appropriate.  Total time for H and P, chart review, counseling, reviewing hfa device(s) and generating customized AVS unique to this 1st  office visit / same day charting= 48 min     Christinia Gully, MD 07/20/2021

## 2021-07-20 NOTE — Patient Instructions (Addendum)
Plan A = Automatic = Always=    symbicort 80 (dulera 100)  Take 2 puffs first thing in am and then another 2 puffs about 12 hours later. X one week then as needed  Work on inhaler technique:  relax and gently blow all the way out then take a nice smooth full deep breath back in, triggering the inhaler at same time you start breathing in.  Hold for up to 5 seconds if you can. Blow out thru nose. Rinse and gargle with water when done.  If mouth or throat bother you at all,  try brushing teeth/gums/tongue with arm and hammer toothpaste/ make a slurry and gargle and spit out.      Continue singulair each evening   Plan B = Backup (to supplement plan A, not to replace it) Only use your albuterol inhaler as a rescue medication to be used if you can't catch your breath by resting or doing a relaxed purse lip breathing pattern.  - The less you use it, the better it will work when you need it. - Ok to use the inhaler up to 2 puffs  every 4 hours if you must but call for appointment if use goes up over your usual need - Don't leave home without it !!  (think of it like the spare tire for your car)     For drainage / throat tickle stop clariton try take CHLORPHENIRAMINE  4 mg  ("Allergy Relief" 4mg   at [facility] should be easiest to find in the blue box usually on bottom shelf)  take one every 4 hours as needed - extremely effective and inexpensive over the counter- may cause drowsiness so start with just a dose or two an hour before bedtime and see how you tolerate it before trying in daytime.   Please remember to go to the lab and x-ray department  for your tests - we will call you with the results when they are available.       Please schedule a follow up visit in 3 months but call sooner if needed

## 2021-07-20 NOTE — Assessment & Plan Note (Addendum)
Onset in her 70s assoc with prob allergic rhinitis and pets in [place] - Spirometry 08/02/16   FEV1 2.91 (95%)  Ratio 0.87 nl f/v loop  - 07/20/2021  After extensive coaching inhaler device,  effectiveness =    75% so try symb 80 2bid prn  - Allergy profile 07/20/2021 >  Eos 0.2 /  IgE  Pending   She has relatively mild asthma with prior pfts nl so mild intermttent likely best description. Based on two studies from NEJM  378; 20 p 1865 (2018) and 380 : p2020-30 (2019) in pts with mild asthma it is reasonable to use low dose symbicort eg 80 2bid "prn" flare in this setting but I emphasized this was only shown with symbicort and takes advantage of the rapid onset of action but is not the same as "rescue therapy" but can be stopped once the acute symptoms have resolved and the need for rescue has been minimized (< 2 x weekly)     Continue singulair 10 mg hs and f/u in 3 m, sooner prn

## 2021-07-20 NOTE — Assessment & Plan Note (Addendum)
Added 1st gen H1 blockers per guidelines  07/20/2021  - Allergy profile 07/20/2021 >  Eos 0.2/  IgE     Advised to blow symbicort out thru nose, continue singulair and get all pets out of [place]         Each maintenance medication was reviewed in detail including emphasizing most importantly the difference between maintenance and prns and under what circumstances the prns are to be triggered using an action plan format where appropriate.  Total time for H and P, chart review, counseling, reviewing hfa device(s) and generating customized AVS unique to this 1st  office visit / same day charting= 48 min

## 2021-07-25 ENCOUNTER — Other Ambulatory Visit: Payer: Self-pay | Admitting: Family Medicine

## 2021-07-25 DIAGNOSIS — Z1231 Encounter for screening mammogram for malignant neoplasm of breast: Secondary | ICD-10-CM

## 2021-07-25 LAB — IGE: IgE (Immunoglobulin E), Serum: 17 IU/mL (ref 6–495)

## 2021-08-09 ENCOUNTER — Ambulatory Visit: Payer: Medicare Other

## 2021-08-09 ENCOUNTER — Other Ambulatory Visit: Payer: Self-pay | Admitting: Internal Medicine

## 2021-08-09 DIAGNOSIS — E1140 Type 2 diabetes mellitus with diabetic neuropathy, unspecified: Secondary | ICD-10-CM

## 2021-08-09 NOTE — Telephone Encounter (Signed)
Requested medication (s) are due for refill today: yes  Requested medication (s) are on the active medication list: yes  Last refill:  06/26/21 #55ml 1 refill  Future visit scheduled: yes in 1 month  Notes to clinic:  To pharmacy: DX Code Needed       Requested Prescriptions  Pending Prescriptions Disp Refills   TRULICITY 1.94 RD/4.0CX SOPN [Pharmacy Med Name: TRULICITY 4.48 JE/5.6 ML PEN]  1    Sig: INJECT 0.75 MG (0.5 ML) INTO THE SKIN ONCE A WEEK.     Endocrinology:  Diabetes - GLP-1 Receptor Agonists Passed - 08/09/2021  8:36 AM      Passed - HBA1C is between 0 and 7.9 and within 180 days    Hemoglobin A1C  Date Value Ref Range Status  05/30/2021 5.8 (A) 4.0 - 5.6 % Final  05/18/2012 6.1 4.2 - 6.3 % Final    Comment:    The American Diabetes Association recommends that a primary goal of therapy should be <7% and that physicians should reevaluate the treatment regimen in patients with HbA1c values consistently >8%.    HbA1c, POC (controlled diabetic range)  Date Value Ref Range Status  02/20/2018 5.5 0.0 - 7.0 % Final   Hgb A1c MFr Bld  Date Value Ref Range Status  08/04/2019 5.6 <5.7 % of total Hgb Final    Comment:    For the purpose of screening for the presence of diabetes: . <5.7%       Consistent with the absence of diabetes 5.7-6.4%    Consistent with increased risk for diabetes             (prediabetes) > or =6.5%  Consistent with diabetes . This assay result is consistent with a decreased risk of diabetes. . Currently, no consensus exists regarding use of hemoglobin A1c for diagnosis of diabetes in children. . According to American Diabetes Association (ADA) guidelines, hemoglobin A1c <7.0% represents optimal control in non-pregnant diabetic patients. Different metrics may apply to specific patient populations.  Standards of Medical Care in Diabetes(ADA). Renella Cunas - Valid encounter within last 6 months    Recent Outpatient Visits            2 months ago Type 2 diabetes, controlled, with neuropathy [facility])   [facility] [place], Drue Stager, MD   6 months ago Type 2 diabetes, controlled, with neuropathy [facility])   [facility] Steele Sizer, MD   7 months ago COVID-87   [facility] Steele Sizer, MD   10 months ago Type 2 diabetes, controlled, with neuropathy [facility])   [facility] Steele Sizer, MD   1 year ago [facility] preventive visit   [facility] Steele Sizer, MD       Future Appointments             In 1 month Ancil Boozer, Drue Stager, MD [facility] [place]   In 2 months Melvyn Novas, Christena Deem, MD [place]

## 2021-08-21 ENCOUNTER — Other Ambulatory Visit: Payer: Self-pay

## 2021-08-21 ENCOUNTER — Telehealth: Payer: Self-pay

## 2021-08-21 ENCOUNTER — Ambulatory Visit (INDEPENDENT_AMBULATORY_CARE_PROVIDER_SITE_OTHER): Payer: Medicare Other

## 2021-08-21 DIAGNOSIS — Z0000 Encounter for general adult medical examination without abnormal findings: Secondary | ICD-10-CM

## 2021-08-21 DIAGNOSIS — E1140 Type 2 diabetes mellitus with diabetic neuropathy, unspecified: Secondary | ICD-10-CM

## 2021-08-21 MED ORDER — FREESTYLE LANCETS MISC: 12 refills | Status: DC

## 2021-08-21 MED ORDER — FREESTYLE LITE TEST VI STRP: ORAL_STRIP | 12 refills | Status: DC

## 2021-08-21 NOTE — Telephone Encounter (Signed)
Pt seen today for AWV and requests new rx for freestyle lite test strips with testing directions and freestyle lite lancets to be sent to [facility]. Last rx sent in Jan was for test kit. Thank you!

## 2021-08-21 NOTE — Patient Instructions (Signed)
Jasmine Woods , Thank you for taking time to come for your Medicare Wellness Visit. I appreciate your ongoing commitment to your health goals. Please review the following plan we discussed and let me know if I can assist you in the future.   Screening recommendations/referrals: Colonoscopy: done 09/30/16. Repeat 09/2026 Mammogram: done 08/28/20 Bone Density: done 08/28/20 Recommended yearly ophthalmology/optometry visit for glaucoma screening and checkup Recommended yearly dental visit for hygiene and checkup  Vaccinations: Influenza vaccine: done 03/30/21 Pneumococcal vaccine: done 07/31/20 Tdap vaccine: done 12/15/15 Shingles vaccine: done 11/03/19 & 03/29/20   Covid-19:done 07/09/19, 08/09/19, 04/07/20, 10/17/20 & 03/30/21  Advanced directives: Please bring a copy of your health care power of attorney and living will to the office at your convenience.   Conditions/risks identified: Recommend increasing physical activity to at least 3 days per week   Next appointment: Follow up in one year for your annual wellness visit    Preventive Care 65 Years and Older, Female Preventive care refers to lifestyle choices and visits with your health care provider that can promote health and wellness. What does preventive care include? A yearly physical exam. This is also called an annual well check. Dental exams once or twice a year. Routine eye exams. Ask your health care provider how often you should have your eyes checked. Personal lifestyle choices, including: Daily care of your teeth and gums. Regular physical activity. Eating a healthy diet. Avoiding tobacco and drug use. Limiting alcohol use. Practicing safe sex. Taking low-dose aspirin every day. Taking vitamin and mineral supplements as recommended by your health care provider. What happens during an annual well check? The services and screenings done by your health care provider during your annual well check will depend on your age, overall  health, lifestyle risk factors, and family history of disease. Counseling  Your health care provider may ask you questions about your: Alcohol use. Tobacco use. Drug use. Emotional well-being. Home and relationship well-being. Sexual activity. Eating habits. History of falls. Memory and ability to understand (cognition). Work and work Statistician. Reproductive health. Screening  You may have the following tests or measurements: Height, weight, and BMI. Blood pressure. Lipid and cholesterol levels. These may be checked every 5 years, or more frequently if you are over 21 years old. Skin check. Lung cancer screening. You may have this screening every year starting at age 34 if you have a 30-pack-year history of smoking and currently smoke or have quit within the past 15 years. Fecal occult blood test (FOBT) of the stool. You may have this test every year starting at age 48. Flexible sigmoidoscopy or colonoscopy. You may have a sigmoidoscopy every 5 years or a colonoscopy every 10 years starting at age 68. Hepatitis C blood test. Hepatitis B blood test. Sexually transmitted disease (STD) testing. Diabetes screening. This is done by checking your blood sugar (glucose) after you have not eaten for a while (fasting). You may have this done every 1-3 years. Bone density scan. This is done to screen for osteoporosis. You may have this done starting at age 24. Mammogram. This may be done every 1-2 years. Talk to your health care provider about how often you should have regular mammograms. Talk with your health care provider about your test results, treatment options, and if necessary, the need for more tests. Vaccines  Your health care provider may recommend certain vaccines, such as: Influenza vaccine. This is recommended every year. Tetanus, diphtheria, and acellular pertussis (Tdap, Td) vaccine. You may need a Td  booster every 10 years. Zoster vaccine. You may need this after age  103. Pneumococcal 13-valent conjugate (PCV13) vaccine. One dose is recommended after age 78. Pneumococcal polysaccharide (PPSV23) vaccine. One dose is recommended after age 31. Talk to your health care provider about which screenings and vaccines you need and how often you need them. This information is not intended to replace advice given to you by your health care provider. Make sure you discuss any questions you have with your health care provider. Document Released: 07/07/2015 Document Revised: 02/28/2016 Document Reviewed: 04/11/2015 Elsevier Interactive Patient Education  2017 [employer] Prevention in the Home Falls can cause injuries. They can happen to people of all ages. There are many things you can do to make your home safe and to help prevent falls. What can I do on the outside of my home? Regularly fix the edges of walkways and driveways and fix any cracks. Remove anything that might make you trip as you walk through a door, such as a raised step or threshold. Trim any bushes or trees on the path to your home. Use bright outdoor lighting. Clear any walking paths of anything that might make someone trip, such as rocks or tools. Regularly check to see if handrails are loose or broken. Make sure that both sides of any steps have handrails. Any raised decks and porches should have guardrails on the edges. Have any leaves, snow, or ice cleared regularly. Use sand or salt on walking paths during winter. Clean up any spills in your garage right away. This includes oil or grease spills. What can I do in the bathroom? Use night lights. Install grab bars by the toilet and in the tub and shower. Do not use towel bars as grab bars. Use non-skid mats or decals in the tub or shower. If you need to sit down in the shower, use a plastic, non-slip stool. Keep the floor dry. Clean up any water that spills on the floor as soon as it happens. Remove soap buildup in the tub or shower  regularly. Attach bath mats securely with double-sided non-slip rug tape. Do not have throw rugs and other things on the floor that can make you trip. What can I do in the bedroom? Use night lights. Make sure that you have a light by your bed that is easy to reach. Do not use any sheets or blankets that are too big for your bed. They should not hang down onto the floor. Have a firm chair that has side arms. You can use this for support while you get dressed. Do not have throw rugs and other things on the floor that can make you trip. What can I do in the kitchen? Clean up any spills right away. Avoid walking on wet floors. Keep items that you use a lot in easy-to-reach places. If you need to reach something above you, use a strong step stool that has a grab bar. Keep electrical cords out of the way. Do not use floor polish or wax that makes floors slippery. If you must use wax, use non-skid floor wax. Do not have throw rugs and other things on the floor that can make you trip. What can I do with my stairs? Do not leave any items on the stairs. Make sure that there are handrails on both sides of the stairs and use them. Fix handrails that are broken or loose. Make sure that handrails are as long as the stairways. Check any carpeting  to make sure that it is firmly attached to the stairs. Fix any carpet that is loose or worn. Avoid having throw rugs at the top or bottom of the stairs. If you do have throw rugs, attach them to the floor with carpet tape. Make sure that you have a light switch at the top of the stairs and the bottom of the stairs. If you do not have them, ask someone to add them for you. What else can I do to help prevent falls? Wear shoes that: Do not have high heels. Have rubber bottoms. Are comfortable and fit you well. Are closed at the toe. Do not wear sandals. If you use a stepladder: Make sure that it is fully opened. Do not climb a closed stepladder. Make sure that  both sides of the stepladder are locked into place. Ask someone to hold it for you, if possible. Clearly mark and make sure that you can see: Any grab bars or handrails. First and last steps. Where the edge of each step is. Use tools that help you move around (mobility aids) if they are needed. These include: Canes. Walkers. Scooters. Crutches. Turn on the lights when you go into a dark area. Replace any light bulbs as soon as they burn out. Set up your furniture so you have a clear path. Avoid moving your furniture around. If any of your floors are uneven, fix them. If there are any pets around you, be aware of where they are. Review your medicines with your doctor. Some medicines can make you feel dizzy. This can increase your chance of falling. Ask your doctor what other things that you can do to help prevent falls. This information is not intended to replace advice given to you by your health care provider. Make sure you discuss any questions you have with your health care provider. Document Released: 04/06/2009 Document Revised: 11/16/2015 Document Reviewed: 07/15/2014 Elsevier Interactive Patient Education  2017 [employer].

## 2021-08-21 NOTE — Progress Notes (Signed)
Subjective:   Jasmine Woods is a 66 y.o. female who presents for an Initial Medicare Annual Wellness Visit.  Review of Systems     Cardiac Risk Factors include: advanced age (>40mn, >>15women);diabetes mellitus;dyslipidemia;hypertension;obesity (BMI >30kg/m2)     Objective:    There were no vitals filed for this visit. There is no height or weight on file to calculate BMI.  Advanced Directives 08/21/2021 09/15/2020 08/19/2019 08/12/2019 06/08/2019 06/01/2019 04/08/2019  Does Patient Have a Medical Advance Directive? _0  Yes Yes  Type of [place]amed[place]of [place] will - - Healthcare Power of AEmerald- -  Does patient want to make changes to medical advance directive? - - No - Patient declined - - No - Patient declined -  Copy of HNoviin Chart? No - copy requested - No - copy requested (No Data) No - copy requested No - copy requested -  Would patient like information on creating a medical advance directive? - - No - Patient declined - - - -    Current Medications (verified) Outpatient Encounter Medications as of 08/21/2021  Medication Sig   acetaminophen (TYLENOL) 500 MG tablet Take 1,000 mg by mouth every 6 (six) hours as needed for mild pain. Reported on 07/06/2015   Blood Glucose Monitoring Suppl (FREESTYLE FREEDOM) KIT 100 each by Does not apply route every 30 (thirty) days.   brimonidine-timolol (COMBIGAN) 0.2-0.5 % ophthalmic solution 1 drop daily.    budesonide (RHINOCORT AQUA) 32 MCG/ACT nasal spray Place 2 sprays into both nostrils at bedtime.   budesonide-formoterol (SYMBICORT) 80-4.5 MCG/ACT inhaler Take 2 puffs first thing in am and then another 2 puffs about 12 hours later.   chlorpheniramine (CHLOR-TRIMETON) 4 MG tablet Take 4 mg by mouth 2 (two) times daily as needed for allergies.   cholecalciferol (VITAMIN D3) 25 MCG (1000 UNIT) tablet Take 1,000 Units by mouth  daily.   Coenzyme Q10 (CO Q 10) 100 MG CAPS Take 1 capsule by mouth daily.   DULoxetine (CYMBALTA) 60 MG capsule TAKE 1 CAPSULE (60 MG TOTAL) BY MOUTH DAILY.   latanoprost (XALATAN) 0.005 % ophthalmic solution Place 1 drop into the left eye at bedtime.    montelukast (SINGULAIR) 10 MG tablet TAKE 1 TABLET BY MOUTH EVERYDAY AT BEDTIME   Multiple Vitamins-Minerals (MULTIVITAMIN WITH MINERALS) tablet Take 1 tablet by mouth daily.   Omega-3 Fatty Acids (FISH OIL) 1200 MG CAPS Take 2 capsules by mouth daily.   pyridostigmine (MESTINON) 60 MG tablet Take 1 tablet at 8a, noona, and 4p.  OK to take extra dose as needed.   rosuvastatin (CRESTOR) 20 MG tablet Take 1 tablet (20 mg total) by mouth daily.   telmisartan-hydrochlorothiazide (MICARDIS HCT) 80-25 MG tablet Take 1 tablet by mouth daily.   TRULICITY 04.08MXK/4.8JESOPN INJECT 0.75 MG (0.5 ML) INTO THE SKIN ONCE A WEEK.   albuterol (VENTOLIN HFA) 108 (90 Base) MCG/ACT inhaler INHALE 2 PUFFS BY MOUTH EVERY 4 HOURS AS NEEDED FOR WHEEZING OR SHORTNESS OF BREATH.   [DISCONTINUED] Fluticasone-Salmeterol (ADVAIR) 100-50 MCG/DOSE AEPB Inhale 1 puff into the lungs 2 (two) times daily.   [DISCONTINUED] loratadine (CLARITIN) 10 MG tablet Take 1 tablet (10 mg total) by mouth 2 (two) times daily.   No facility-administered encounter medications on file as of 08/21/2021.    Allergies (verified) Aspirin, Azithromycin, Ace inhibitors, Amlodipine, Gabapentin, and Pregabalin   History: Past Medical History:  Diagnosis Date  Allergy    Anxiety    Asthma    Bladder polyp    Dr. Ernst Spell   Carpal tunnel syndrome    Complication of anesthesia    needed nebulizer after surgery   Depression    Diabetes mellitus without complication ([place])    Diet controlled   Double vision    seen by Dr. 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 myastenia gravis   Dyslipidemia    Frequency of urination    Glaucoma, left eye    H/O myasthenia gravis    MOSTLY AFFECTS EYES   HA (headache)     Heart murmur    followed by PCP   Hx of bariatric surgery    Hypertension    no meds   Internal hemorrhoids    Myasthenia gravis ([place])    "mostly effects eyes"   Myasthenia gravis ([place])    Neuropathy, generalized    diabetic on feet   Nuclear sclerotic cataract of both eyes    Obesity (BMI 30-39.9)    Obstructive sleep apnea on CPAP    uses cpap   Patient is Jehovah's Witness    no blood products   Rotator cuff tendonitis 07/2009   also has impingment right shoulder pain, seen by Dr. Mack Guise   Sleep apnea    Thyroid nodule    Vaginal dryness, menopausal    Vitamin D deficiency    Past Surgical History:  Procedure Laterality Date   ABDOMINAL HYSTERECTOMY  1991   BARIATRIC SURGERY  2014   CARPAL TUNNEL RELEASE Left 06/08/2019   Procedure: LEFT CARPAL TUNNEL RELEASE;  Surgeon: Mcarthur Rossetti, MD;  Location: [facility];  Service: Orthopedics;  Laterality: Left;   CARPAL TUNNEL RELEASE Right 08/19/2019   Procedure: RIGHT CARPAL TUNNEL RELEASE;  Surgeon: Mcarthur Rossetti, MD;  Location: [facility];  Service: Orthopedics;  Laterality: Right;   CATARACT EXTRACTION W/PHACO Left 01/28/2017   Procedure: CATARACT EXTRACTION PHACO AND INTRAOCULAR LENS PLACEMENT (IOC);  Surgeon: Birder [facility], MD;  Location: [facility];  Service: Ophthalmology;  Laterality: Left;  [place] 00:37 AP% 17.8 CDE 6.61 Fluid pack lot # [id number]   COLONOSCOPY  2012   COLONOSCOPY WITH PROPOFOL N/A 09/30/2016   Procedure: COLONOSCOPY WITH PROPOFOL;  Surgeon: Lucilla Lame, MD;  Location: [place];  Service: Endoscopy;  Laterality: N/A;  diabetic - diet controlled sleep apnea   KNEE SURGERY Right 1979-1980   OOPHORECTOMY     PARS PLANA VITRECTOMY Left 03/27/2016   Procedure: PARS PLANA VITRECTOMY WITH 25 GAUGE AND MEMBRANE PEEL, INTERNAL LIMITING MEMBRANE;  Surgeon: Hurman Horn, MD;  Location: [place];  Service: Ophthalmology;  Laterality: Left;   THYROID SURGERY      BX done was benign   UPPER GI ENDOSCOPY     VITRECTOMY Left 04/27/2016   Family History  Problem Relation Age of Onset   Diabetes Mother    Hypertension Mother    Heart disease Mother 83       stent x 1    Migraines Sister    Stroke Maternal Grandfather    Diabetes Paternal Grandfather    Muscular dystrophy Sister    Heart attack Maternal Aunt    Breast cancer Neg Hx    Thyroid disease Neg Hx    Social History   Socioeconomic History   Marital status: Married    Spouse name: Gwyndolyn Saxon "Butch"   Number of children: 2   Years of education: 12   Highest education level: Associate  degree: occupational, technical, or vocational program  Occupational History   Occupation: [employer]  Tobacco Use   Smoking status: Former    Packs/day: 0.50    Years: 3.00    Pack years: 1.50    Types: Cigarettes    Start date: 06/24/1972    Quit date: 06/25/1975    Years since quitting: 46.1   Smokeless tobacco: Never   Tobacco comments:    smoked as teenager  Vaping Use   Vaping Use: Never used  Substance and Sexual Activity   Alcohol use: No    Alcohol/week: 0.0 standard drinks   Drug use: No   Sexual activity: Not Currently    Partners: Male  Other Topics Concern   Not on file  Social History Narrative   Right handed   One story home   12 years education   Retired from [employer] with husband   Social Determinants of Health   Financial Resource Strain: Low Risk    Difficulty of Paying Living Expenses: Not hard at all  Food Insecurity: No Food Insecurity   Worried About Charity fundraiser in the Last Year: Never true   Arboriculturist in the Last Year: Never true  Transportation Needs: No Transportation Needs   Lack of Transportation (Medical): No   Lack of Transportation (Non-Medical): No  Physical Activity: Inactive   Days of Exercise per Week: 0 days   Minutes of Exercise per Session: 0 min  Stress: No Stress Concern Present   Feeling of Stress : Only a  little  Social Connections: Engineer, building services of Communication with Friends and Family: More than three times a week   Frequency of Social Gatherings with Friends and Family: More than three times a week   Attends Religious Services: More than 4 times per year   Active Member of [employer] or Organizations: Yes   Attends Music therapist: More than 4 times per year   Marital Status: Married    Tobacco Counseling Counseling given: Not Answered Tobacco comments: smoked as teenager   Clinical Intake:  Pre-visit preparation completed: Yes  Pain : No/denies pain     Nutritional Risks: None Diabetes: Yes CBG done?: No Did pt. bring in CBG monitor from home?: No  How often do you need to have someone help you when you read instructions, pamphlets, or other written materials from your doctor or pharmacy?: 1 - Never  Nutrition Risk Assessment:  Has the patient had any N/V/D within the last 2 months?  No  Does the patient have any non-healing wounds?  No  Has the patient had any unintentional weight loss or weight gain?  No   Diabetes:  Is the patient diabetic?  Yes  If diabetic, was a CBG obtained today?  No  Did the patient bring in their glucometer from home?  No  How often do you monitor your CBG's? 1-2 times per day .   Financial Strains and Diabetes Management:  Are you having any financial strains with the device, your supplies or your medication? No .  Does the patient want to be seen by Chronic Care Management for management of their diabetes?  No  Would the patient like to be referred to a Nutritionist or for Diabetic Management?  No   Diabetic Exams:  Diabetic Eye Exam: Completed 12/18/20 negative retinopathy.   Diabetic Foot Exam: Completed 05/30/21.   Interpreter Needed?: No  Information entered by :: [employer]  Mahsa Hanser LPN   Activities of Daily Living In your present state of health, do you have any difficulty performing the following  activities: 08/21/2021 05/30/2021  Hearing? N N  Vision? N N  Difficulty concentrating or making decisions? N N  Walking or climbing stairs? N N  Dressing or bathing? N N  Doing errands, shopping? N N  Preparing Food and eating ? N -  Using the Toilet? N -  In the past six months, have you accidently leaked urine? N -  Do you have problems with loss of bowel control? N -  Managing your Medications? N -  Managing your Finances? N -  Housekeeping or managing your Housekeeping? N -  Some recent data might be hidden    Patient Care Team: Steele Sizer, MD as PCP - General (Family Medicine) Steele Sizer, MD as Attending Physician (Family Medicine) Alda Berthold, DO as Consulting Physician (Neurology) Renato Shin, MD as Consulting Physician (Endocrinology) End, Harrell Gave, MD as Consulting Physician (Cardiology) Lucilla Lame, MD as Consulting Physician (Gastroenterology) Tanda Rockers, MD as Consulting Physician (Pulmonary Disease) Allyne Gee, MD as Consulting Physician (Internal Medicine)  Indicate any recent Medical Services you may have received from other than [facility] providers in the past year (date may be approximate).     Assessment:   This is a routine wellness examination for Jasmine Woods.  Hearing/Vision screen Hearing Screening - Comments:: Pt denies hearing difficulty Vision Screening - Comments:: Annual vision screenings [facility] with [facility]   Dietary issues and exercise activities discussed: Current Exercise Habits: The patient does not participate in regular exercise at present, Exercise limited by: None identified   Goals Addressed             This Visit's Progress    Increase physical activity       Recommend increasing physical activity to at least 3 days per week        Depression Screen PHQ 2/9 Scores 08/21/2021 05/30/2021 01/29/2021 12/22/2020 09/29/2020 07/31/2020 03/29/2020  PHQ - 2 Score 0 1 0 0 0 0 0  PHQ- 9 Score - 4 - - - - -    Fall  Risk Fall Risk  08/21/2021 05/30/2021 01/29/2021 12/22/2020 09/29/2020  Falls in the past year? 0 0 0 0 1  Number falls in past yr: 0 0 0 0 1  Injury with Fall? 0 0 0 0 0  Risk for fall due to : No Fall Risks No Fall Risks - - -  Follow up Falls prevention discussed Falls prevention discussed - Falls evaluation completed -    FALL RISK PREVENTION PERTAINING TO THE HOME:  Any stairs in or around the home? No  If so, are there any without handrails? No  Home free of loose throw rugs in walkways, pet beds, electrical cords, etc? Yes  Adequate lighting in your home to reduce risk of falls? Yes   ASSISTIVE DEVICES UTILIZED TO PREVENT FALLS:  Life alert? No  Use of a cane, walker or w/c? No  Grab bars in the bathroom? Yes  Shower chair or bench in shower? No  Elevated toilet seat or a handicapped toilet? No   TIMED UP AND GO:  Was the test performed? No . Telephonic visit.   Cognitive Function: Normal cognitive status assessed by direct observation by this Nurse Health Advisor. No abnormalities found.          Immunizations Immunization History  Administered Date(s) Administered   Influenza, Quadrivalent, Recombinant, Inj, Pf  03/24/2019   Influenza,inj,Quad PF,6+ Mos 03/22/2020   Influenza-Unspecified 04/06/2015, 04/04/2016, 04/03/2017, 03/18/2018, 03/30/2021   PFIZER Comirnaty(Gray Top)Covid-19 Tri-Sucrose Vaccine 10/17/2020   PFIZER(Purple Top)SARS-COV-2 Vaccination 07/09/2019, 07/30/2019, 04/07/2020, 10/17/2020, 03/30/2021   Pneumococcal Conjugate-13 07/06/2015, 07/31/2020   Pneumococcal Polysaccharide-23 12/19/2011, 03/18/2017   Tdap 10/24/2005, 12/15/2015   Zoster Recombinat (Shingrix) 11/03/2019, 03/29/2020   Zoster, Live 03/25/2011    TDAP status: Up to date  Flu Vaccine status: Up to date  Pneumococcal vaccine status: Up to date  Covid-19 vaccine status: Completed vaccines  Qualifies for Shingles Vaccine? Yes   Zostavax completed Yes   Shingrix Completed?:  Yes  Screening Tests Health Maintenance  Topic Date Due   HEMOGLOBIN A1C  11/28/2021   OPHTHALMOLOGY EXAM  12/18/2021   Pneumonia Vaccine 23+ Years old (3) 03/18/2022   FOOT EXAM  05/30/2022   MAMMOGRAM  08/29/2022   TETANUS/TDAP  12/14/2025   COLONOSCOPY (Pts 45-59yr Insurance coverage will need to be confirmed)  10/01/2026   INFLUENZA VACCINE  Completed   DEXA SCAN  Completed   COVID-19 Vaccine  Completed   Hepatitis C Screening  Completed   Zoster Vaccines- Shingrix  Completed   HPV VACCINES  Aged Out    Health Maintenance  There are no preventive care reminders to display for this patient.  Colorectal cancer screening: Type of screening: Colonoscopy. Completed 09/30/16. Repeat every 10 years  Mammogram status: Completed 08/28/20. Repeat every year  Bone Density status: Completed 08/28/20. Results reflect: Bone density results: NORMAL. Repeat every 2 years.  Lung Cancer Screening: (Low Dose CT Chest recommended if Age 67-80years, 30 pack-year currently smoking OR have quit w/in 15years.) does not qualify.   Additional Screening:  Hepatitis C Screening: does qualify; Completed 10/30/12  Vision Screening: Recommended annual ophthalmology exams for early detection of glaucoma and other disorders of the eye. Is the patient up to date with their annual eye exam?  Yes  Who is the provider or what is the name of the office in which the patient attends annual eye exams? [facility]   Dental Screening: Recommended annual dental exams for proper oral hygiene  Community Resource Referral / Chronic Care Management: CRR required this visit?  No   CCM required this visit?  No      Plan:     I have personally reviewed and noted the following in the patients chart:   Medical and social history Use of alcohol, tobacco or illicit drugs  Current medications and supplements including opioid prescriptions. Patient is not currently taking opioid prescriptions. Functional  ability and status Nutritional status Physical activity Advanced directives List of other physicians Hospitalizations, surgeries, and ER visits in previous 12 months Vitals Screenings to include cognitive, depression, and falls Referrals and appointments  In addition, I have reviewed and discussed with patient certain preventive protocols, quality metrics, and best practice recommendations. A written personalized care plan for preventive services as well as general preventive health recommendations were provided to patient.     KClemetine Marker LPN   [id number]  Nurse Notes: pt in need of new rx for freestyle lite test strips and lancets [facility] New kit was sent in previously but needs individual rx for strips and lancets. Telephone encounter rx refill request created.

## 2021-08-23 ENCOUNTER — Other Ambulatory Visit: Payer: Self-pay | Admitting: Family Medicine

## 2021-08-23 DIAGNOSIS — E1140 Type 2 diabetes mellitus with diabetic neuropathy, unspecified: Secondary | ICD-10-CM

## 2021-08-23 NOTE — Progress Notes (Addendum)
Note addendum for telehealth documentation:   Virtual Visit via Telephone Note  I connected with  Jasmine Woods on 08/23/21 at  8:40 AM EST by telephone and verified that I am speaking with the correct person using two identifiers.  Location: Patient: home Provider: [place] Persons participating in the virtual visit: [place]   I discussed the limitations, risks, security and privacy concerns of performing an evaluation and management service by telephone and the availability of in person appointments. The patient expressed understanding and agreed to proceed.  Interactive audio and video telecommunications were attempted between this nurse and patient, however failed, due to patient having technical difficulties OR patient did not have access to video capability.  We continued and completed visit with audio only.  Some vital signs may be absent or patient reported.   Clemetine Marker, LPN

## 2021-08-24 ENCOUNTER — Other Ambulatory Visit: Payer: Self-pay | Admitting: Family Medicine

## 2021-08-24 DIAGNOSIS — E1140 Type 2 diabetes mellitus with diabetic neuropathy, unspecified: Secondary | ICD-10-CM

## 2021-08-24 NOTE — Telephone Encounter (Signed)
Requested medication (s) are due for refill today - no ? ?Requested medication (s) are on the active medication list - yes ? ?Future visit scheduled -yes ? ?Last refill: 08/23/21 ? ?Notes to clinic: Pharmacy request more specific directions- testing frequency to help with insurance coverage ? ?Requested Prescriptions  ?Pending Prescriptions Disp Refills  ? Lancets (FREESTYLE) lancets [Pharmacy Med Name: FREESTYLE 28G LANCETS] 100 each 12  ?  Sig: USE AS INSTRUCTED  ?  ? Endocrinology: Diabetes - Testing Supplies Passed - 08/24/2021  1:34 PM  ?  ?  Passed - Valid encounter within last 12 months  ?  Recent Outpatient Visits   ? ?      ? 2 months ago Type 2 diabetes, controlled, with neuropathy ([place])  ? [facility], Drue Stager, MD  ? 6 months ago Type 2 diabetes, controlled, with neuropathy ([place])  ? [facility] Steele Sizer, MD  ? 8 months ago COVID-19  ? [facility] [place], Drue Stager, MD  ? 10 months ago Type 2 diabetes, controlled, with neuropathy ([place])  ? [facility] Steele Sizer, MD  ? 1 year ago Welcome to Medicare preventive visit  ? [facility] Sizer, [facility]  ? ?  ?  ?Future Appointments   ? ?        ? In 1 month Sowles, Drue Stager, MD [facility] [place]  ? In 1 month Wert, Christena Deem, MD [facility] [place]  ? In 6 months  [place]  ? ?  ? ?  ?  ?  ? ? ? ?Requested Prescriptions  ?Pending Prescriptions Disp Refills  ? Lancets (FREESTYLE) lancets [Pharmacy Med Name: FREESTYLE 28G LANCETS] 100 each 12  ?  Sig: USE AS INSTRUCTED  ?  ? Endocrinology: Diabetes - Testing Supplies Passed - 08/24/2021  1:34 PM  ?  ?  Passed - Valid encounter within last 12 months  ?  Recent Outpatient Visits   ? ?      ? 2 months ago Type 2 diabetes, controlled, with neuropathy ([place])  ? [facility] [place], Drue Stager, MD  ? 6 months ago Type 2 diabetes,  controlled, with neuropathy ([place])  ? [facility] Steele Sizer, MD  ? 8 months ago COVID-19  ? [facility] [place], Drue Stager, MD  ? 10 months ago Type 2 diabetes, controlled, with neuropathy ([place])  ? [facility] Steele Sizer, MD  ? 1 year ago Welcome to Medicare preventive visit  ? [facility] Steele Sizer, MD  ? ?  ?  ?Future Appointments   ? ?        ? In 1 month Sowles, Drue Stager, MD [facility] [place]  ? In 1 month Wert, Christena Deem, MD [facility] [place]  ? In 34 months  [place]  ? ?  ? ?  ?  ?  ? ? ? ?

## 2021-08-31 ENCOUNTER — Other Ambulatory Visit: Payer: Self-pay | Admitting: Family Medicine

## 2021-08-31 ENCOUNTER — Telehealth: Payer: Self-pay | Admitting: Family Medicine

## 2021-08-31 ENCOUNTER — Other Ambulatory Visit: Payer: Self-pay

## 2021-08-31 DIAGNOSIS — E1140 Type 2 diabetes mellitus with diabetic neuropathy, unspecified: Secondary | ICD-10-CM

## 2021-08-31 MED ORDER — FREESTYLE LITE TEST VI STRP: 100.0000 | ORAL_STRIP | Freq: Every day | 12 refills | Status: DC

## 2021-08-31 MED ORDER — FREESTYLE LANCETS MISC: 12 refills | Status: DC

## 2021-08-31 NOTE — Telephone Encounter (Signed)
Spoke with pharmacist to clarify, new order was placed as he stated Medicare part D required specific instructions in order to cover.  ?

## 2021-08-31 NOTE — Telephone Encounter (Signed)
Copied from [place] [id number]. Topic: General - Other ?>> Aug 31, 2021  8:16 AM Leward Quan A wrote: ?Reason for CRM: Patient called in to inform Dr Ancil Boozer that she still can not get her glucose blood (FREESTYLE LITE) test strip and Lancets (FREESTYLE) lancets since Rx does not state how many times of day she should be testing. Per patient she have been trying to get this resolved for more than a week and pharmacy have not heard from Dr Ancil Boozer or her nurse. And patient would like a call back when situation has been resolved please can be reached at  Ph# [phone] ?

## 2021-09-17 ENCOUNTER — Other Ambulatory Visit: Payer: Self-pay

## 2021-09-17 ENCOUNTER — Ambulatory Visit: Payer: Medicare Other | Admitting: Neurology

## 2021-09-24 ENCOUNTER — Other Ambulatory Visit: Payer: Self-pay

## 2021-09-24 DIAGNOSIS — E1140 Type 2 diabetes mellitus with diabetic neuropathy, unspecified: Secondary | ICD-10-CM

## 2021-09-26 ENCOUNTER — Ambulatory Visit (INDEPENDENT_AMBULATORY_CARE_PROVIDER_SITE_OTHER): Payer: Medicare Other | Admitting: Endocrinology

## 2021-09-26 VITALS — BP 124/78 | HR 72 | Ht 62.0 in | Wt 253.2 lb

## 2021-09-26 DIAGNOSIS — E042 Nontoxic multinodular goiter: Secondary | ICD-10-CM

## 2021-09-26 LAB — TSH: TSH: 0.89 u[IU]/mL (ref 0.35–5.50)

## 2021-09-26 LAB — T4, FREE: Free T4: 0.79 ng/dL (ref 0.60–1.60)

## 2021-09-26 NOTE — Progress Notes (Signed)
?Subjective:  ? ? Patient ID: Jasmine Woods, female    DOB: [birth year], 66 y.o.   MRN: [id number] ? ?HPI ?Pt returns for f/u of MNG (dx'ed 2017; she had bxs in 2017 and 2018, in [place] (results not available, but pt says were benign); f/u US in 2021 advised f/u; she is euthyroid off thyroid rx).  She does not notice the goiter.   ?Past Medical History:  ?Diagnosis Date  ? Allergy   ? Anxiety   ? Asthma   ? Bladder polyp   ? Dr. Ernst Spell  ? Carpal tunnel syndrome   ? Complication of anesthesia   ? needed nebulizer after surgery  ? Depression   ? Diabetes mellitus without complication ([place])   ? Diet controlled  ? Double vision   ? seen by Dr. 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 myastenia gravis  ? Dyslipidemia   ? Frequency of urination   ? Glaucoma, left eye   ? H/O myasthenia gravis   ? MOSTLY AFFECTS EYES  ? HA (headache)   ? Heart murmur   ? followed by PCP  ? Hx of bariatric surgery   ? Hypertension   ? no meds  ? Internal hemorrhoids   ? Myasthenia gravis ([place])   ? "mostly effects eyes"  ? Myasthenia gravis ([place])   ? Neuropathy, generalized   ? diabetic on feet  ? Nuclear sclerotic cataract of both eyes   ? Obesity (BMI 30-39.9)   ? Obstructive sleep apnea on CPAP   ? uses cpap  ? Patient is Jehovah's Witness   ? no blood products  ? Rotator cuff tendonitis 07/2009  ? also has impingment right shoulder pain, seen by Dr. Mack Guise  ? Sleep apnea   ? Thyroid nodule   ? Vaginal dryness, menopausal   ? Vitamin D deficiency   ? ? ?Past Surgical History:  ?Procedure Laterality Date  ? ABDOMINAL HYSTERECTOMY  1991  ? [place] SURGERY  2014  ? CARPAL TUNNEL RELEASE Left 06/08/2019  ? Procedure: LEFT CARPAL TUNNEL RELEASE;  Surgeon: Mcarthur Rossetti, MD;  Location: [place];  Service: Orthopedics;  Laterality: Left;  ? CARPAL TUNNEL RELEASE Right 08/19/2019  ? Procedure: RIGHT CARPAL TUNNEL RELEASE;  Surgeon: Mcarthur Rossetti, MD;  Location: [place];  Service:  Orthopedics;  Laterality: Right;  ? CATARACT EXTRACTION W/PHACO Left 01/28/2017  ? Procedure: CATARACT EXTRACTION PHACO AND INTRAOCULAR LENS PLACEMENT (IOC);  Surgeon: Birder Robson, MD;  Location: [facility];  Service: Ophthalmology;  Laterality: Left;  [place] 00:37 ?AP% 17.8 ?CDE 6.61 ?Fluid pack lot # [id number]  ? COLONOSCOPY  2012  ? COLONOSCOPY WITH PROPOFOL N/A 09/30/2016  ? Procedure: COLONOSCOPY WITH PROPOFOL;  Surgeon: Lucilla Lame, MD;  Location: [place];  Service: Endoscopy;  Laterality: N/A;  diabetic - diet controlled ?sleep apnea  ? KNEE SURGERY Right 1979-1980  ? OOPHORECTOMY    ? PARS PLANA VITRECTOMY Left 03/27/2016  ? Procedure: PARS PLANA VITRECTOMY WITH 25 GAUGE AND MEMBRANE PEEL, INTERNAL LIMITING MEMBRANE;  Surgeon: Hurman Horn, MD;  Location: [place];  Service: Ophthalmology;  Laterality: Left;  ? THYROID SURGERY    ? BX done was benign  ? UPPER GI ENDOSCOPY    ? VITRECTOMY Left 04/27/2016  ? ? ?Social History  ? ?Socioeconomic History  ? Marital status: Married  ?  Spouse name: Allyn Kenner"  ? Number of children: 2  ? Years of education: 37  ? Highest education level: Associate degree: occupational,  technical, or vocational program  ?Occupational History  ? Occupation: [employer]  ?Tobacco Use  ? Smoking status: Former  ?  Packs/day: 0.50  ?  Years: 3.00  ?  Pack years: 1.50  ?  Types: Cigarettes  ?  Start date: 06/24/1972  ?  Quit date: 06/25/1975  ?  Years since quitting: 46.2  ? Smokeless tobacco: Never  ? Tobacco comments:  ?  smoked as teenager  ?Vaping Use  ? Vaping Use: Never used  ?Substance and Sexual Activity  ? Alcohol use: No  ?  Alcohol/week: 0.0 standard drinks  ? Drug use: No  ? Sexual activity: Not Currently  ?  Partners: Male  ?Other Topics Concern  ? Not on file  ?Social History Narrative  ? Right handed  ? One story home  ? 12 years education  ? Retired from [employer]  ? Lives with husband  ? ?Social Determinants of Health  ? ?Financial Resource Strain: Low Risk   ?  Difficulty of Paying Living Expenses: Not hard at all  ?Food Insecurity: No Food Insecurity  ? Worried About Charity fundraiser in the Last Year: Never true  ? Ran Out of Food in the Last Year: Never true  ?Transportation Needs: No Transportation Needs  ? Lack of Transportation (Medical): No  ? Lack of Transportation (Non-Medical): No  ?Physical Activity: Inactive  ? Days of Exercise per Week: 0 days  ? Minutes of Exercise per Session: 0 min  ?Stress: No Stress Concern Present  ? Feeling of Stress : Only a little  ?Social Connections: Socially Integrated  ? Frequency of Communication with Friends and Family: More than three times a week  ? Frequency of Social Gatherings with Friends and Family: More than three times a week  ? Attends Religious Services: More than 4 times per year  ? Active Member of Clubs or Organizations: Yes  ? Attends Archivist Meetings: More than 4 times per year  ? Marital Status: Married  ?Intimate Partner Violence: Not At Risk  ? Fear of Current or Ex-Partner: No  ? Emotionally Abused: No  ? Physically Abused: No  ? Sexually Abused: No  ? ? ?Current Outpatient Medications on File Prior to Visit  ?Medication Sig Dispense Refill  ? acetaminophen (TYLENOL) 500 MG tablet Take 1,000 mg by mouth every 6 (six) hours as needed for mild pain. Reported on 07/06/2015    ? Blood Glucose Monitoring Suppl (FREESTYLE FREEDOM) KIT 100 each by Does not apply route every 30 (thirty) days. 1 kit 3  ? brimonidine-timolol (COMBIGAN) 0.2-0.5 % ophthalmic solution 1 drop daily.     ? budesonide (RHINOCORT AQUA) 32 MCG/ACT nasal spray Place 2 sprays into both nostrils at bedtime. 15 mL 2  ? budesonide-formoterol (SYMBICORT) 80-4.5 MCG/ACT inhaler Take 2 puffs first thing in am and then another 2 puffs about 12 hours later. 1 each 12  ? chlorpheniramine (CHLOR-TRIMETON) 4 MG tablet Take 4 mg by mouth 2 (two) times daily as needed for allergies.    ? cholecalciferol (VITAMIN D3) 25 MCG (1000 UNIT) tablet  Take 1,000 Units by mouth daily.    ? Coenzyme Q10 (CO Q 10) 100 MG CAPS Take 1 capsule by mouth daily. 30 capsule 2  ? glucose blood (FREESTYLE LITE) test strip 100 each by Other route daily. Use one strip daily for blood glucose testing 100 each 12  ? Lancets (FREESTYLE) lancets Use one lancet daily for blood glucose testing 100 each 12  ?  latanoprost (XALATAN) 0.005 % ophthalmic solution Place 1 drop into the left eye at bedtime.     ? Multiple Vitamins-Minerals (MULTIVITAMIN WITH MINERALS) tablet Take 1 tablet by mouth daily.    ? Omega-3 Fatty Acids (FISH OIL) 1200 MG CAPS Take 2 capsules by mouth daily.    ? pyridostigmine (MESTINON) 60 MG tablet Take 1 tablet at 8a, noona, and 4p.  OK to take extra dose as needed. 360 tablet 3  ? rosuvastatin (CRESTOR) 20 MG tablet Take 1 tablet (20 mg total) by mouth daily. 90 tablet 1  ? albuterol (VENTOLIN HFA) 108 (90 Base) MCG/ACT inhaler INHALE 2 PUFFS BY MOUTH EVERY 4 HOURS AS NEEDED FOR WHEEZING OR SHORTNESS OF BREATH. 18 g 0  ? [DISCONTINUED] Fluticasone-Salmeterol (ADVAIR) 100-50 MCG/DOSE AEPB Inhale 1 puff into the lungs 2 (two) times daily. 1 each 2  ? ?No current facility-administered medications on file prior to visit.  ? ? ?Allergies  ?Allergen Reactions  ? Aspirin Swelling  ?  Upset stomach  ?Upset stomach   ? Azithromycin   ?  Contraindicated in Myasthenia   ? Ace Inhibitors   ?  cough  ? Amlodipine   ?  Edema of feet and hands  ? Gabapentin   ?  anxiety  ? Pregabalin Other (See Comments)  ?  anxiety  ? ? ?Family History  ?Problem Relation Age of Onset  ? Diabetes Mother   ? Hypertension Mother   ? Heart disease Mother 57  ?     stent x 1   ? Migraines Sister   ? Stroke Maternal Grandfather   ? Diabetes Paternal Grandfather   ? Muscular dystrophy Sister   ? Heart attack Maternal Aunt   ? Breast cancer Neg Hx   ? Thyroid disease Neg Hx   ? ? ?BP 124/78 (BP Location: Left Arm, Patient Position: Sitting, Cuff Size: Normal)   Pulse 72   Ht '5\' 2"'  (1.575 m)   Wt  253 lb 3.2 oz (114.9 kg)   SpO2 97%   BMI 46.31 kg/m?  ? ? ?Review of Systems ? ?   ?Objective:  ? Physical Exam ?VITAL SIGNS:  See vs page ?GENERAL: no distress ?NECK: There is no palpable thyroid enlargement.  No th

## 2021-09-26 NOTE — Patient Instructions (Addendum)
Blood tests are requested for you today.  We'll let you know about the results.   ?Let's recheck the ultrasound.  you will receive a phone call, about a day and time for an appointment.   ?If there is no change, please come back for a follow-up appointment in 2 years.   ?

## 2021-09-28 ENCOUNTER — Ambulatory Visit (INDEPENDENT_AMBULATORY_CARE_PROVIDER_SITE_OTHER): Payer: Medicare Other | Admitting: Family Medicine

## 2021-09-28 ENCOUNTER — Encounter: Payer: Self-pay | Admitting: Family Medicine

## 2021-09-28 VITALS — BP 122/70 | HR 78 | Resp 16 | Ht 62.0 in | Wt 254.0 lb

## 2021-09-28 DIAGNOSIS — G629 Polyneuropathy, unspecified: Secondary | ICD-10-CM

## 2021-09-28 DIAGNOSIS — E1169 Type 2 diabetes mellitus with other specified complication: Secondary | ICD-10-CM | POA: Diagnosis not present

## 2021-09-28 DIAGNOSIS — E1140 Type 2 diabetes mellitus with diabetic neuropathy, unspecified: Secondary | ICD-10-CM

## 2021-09-28 DIAGNOSIS — E785 Hyperlipidemia, unspecified: Secondary | ICD-10-CM

## 2021-09-28 DIAGNOSIS — E119 Type 2 diabetes mellitus without complications: Secondary | ICD-10-CM

## 2021-09-28 DIAGNOSIS — G4733 Obstructive sleep apnea (adult) (pediatric): Secondary | ICD-10-CM

## 2021-09-28 DIAGNOSIS — G7000 Myasthenia gravis without (acute) exacerbation: Secondary | ICD-10-CM | POA: Diagnosis not present

## 2021-09-28 DIAGNOSIS — E559 Vitamin D deficiency, unspecified: Secondary | ICD-10-CM

## 2021-09-28 DIAGNOSIS — E538 Deficiency of other specified B group vitamins: Secondary | ICD-10-CM

## 2021-09-28 DIAGNOSIS — J4530 Mild persistent asthma, uncomplicated: Secondary | ICD-10-CM

## 2021-09-28 DIAGNOSIS — I10 Essential (primary) hypertension: Secondary | ICD-10-CM

## 2021-09-28 DIAGNOSIS — J3089 Other allergic rhinitis: Secondary | ICD-10-CM

## 2021-09-28 DIAGNOSIS — Z9884 Bariatric surgery status: Secondary | ICD-10-CM

## 2021-09-28 DIAGNOSIS — E042 Nontoxic multinodular goiter: Secondary | ICD-10-CM

## 2021-09-28 MED ORDER — TELMISARTAN-HCTZ 80-25 MG PO TABS: 1.0000 | ORAL_TABLET | Freq: Every day | ORAL | 1 refills | Status: DC

## 2021-09-28 MED ORDER — TRULICITY 1.5 MG/0.5ML ~~LOC~~ SOAJ: 1.5000 mg | SUBCUTANEOUS | 1 refills | Status: DC

## 2021-09-28 MED ORDER — MONTELUKAST SODIUM 10 MG PO TABS: ORAL_TABLET | ORAL | 1 refills | Status: DC

## 2021-09-28 MED ORDER — DULOXETINE HCL 60 MG PO CPEP: ORAL_CAPSULE | Freq: Every day | ORAL | 1 refills | Status: DC

## 2021-09-28 NOTE — Progress Notes (Signed)
Name: Jasmine Woods   MRN: [id number]    DOB: 06-Jan-1956   Date:09/28/2021 ? ?     Progress Note ? ?Subjective ? ?Chief Complaint ? ?Follow Up ? ?HPI ? ?DMII:she is compliant with Trulicity and doing well, A1C was 5.8 % and we will recheck labs today . Marland Kitchen Neuropathy is still present , constant, burning- like, she is taking Duloxetine and helps with symptoms . She could not tolerate Gabapentin or Lyrica and also did not work. She also has associated HTN, obesity and dyslipidemia. She is on ARB and Rosuvastatin. Denies side effects of medication  ? ?HTN: she is on Micardis 80/25 mg, bp is slightly higher , likely from weight gain, no chest pain, palpitation . She denies orthostatic changes  ?  ?Hyperlipidemia: taking Crestor at night but not as compliant with medication at night, advised to take it in am's, she is on higher dose and we will recheck labs today  ?  ?Asthma Mild Intermittent : she is now seeing Dr. Melvyn Novas and is back on Symbicort and also taking singulair. She is doing well, no cough, wheezing or sob.  ?  ?Myasthenia Gravis: diagnosed at [facility] by Dr. Burnett Harry  09/2015, seronegative, only causing ptosis and double vision, no other symptoms. She had a negative chest CT for thymoma, and has started on medication ( Mestinon ) She is now seeing Dr. Posey Pronto, [facility], stable symptoms . Unchanged  ?   ?Abnormal CT thyroid: incidental finding of thyroid calcification of left lobe, she saw Endo and had negative biopsy.  Recently seen by Dr. Raynelle Fanning, had labs and will have repeat US  ?  ?Bariatric Surgery/Obesity: she had bariatric surgery back in 2014 , her weight was almost 300 lbs, went down to close to 200lbs, but was  gradually gaining weight,she was started on Trulicity 15/4008 and lost 15 lbs , but is gaining it back, she was 241 lbs July 2020 and we started [place], she had lost 11 lbs on the medication ( 229 lbs ), but ran out of medication and gained it back,  we gave a new rx Feb 2021 she  titrated dose up, but there a gap with PA and she had to re-start medication April  2021 ,weight went from  233 lbs to 229 8 lbs, but gradually gaining weight again, last weight was to 246 lbs, it went up to  261 lbs  we resumed Trulicity Dec 6761 and weight today is down to 254 lbs we will adjust dose to 1.5 mg today  ?  ?OSA: she is compliant with CPAP, she is seeing pulmonologist again.  ? ?Patient Active Problem List  ? Diagnosis Date Noted  ? Carpal tunnel syndrome, right upper limb 08/19/2019  ? Carpal tunnel syndrome, left upper limb 06/08/2019  ? Multinodular goiter 11/25/2018  ? Essential hypertension 11/11/2018  ? DOE (dyspnea on exertion) 04/15/2018  ? Hyperlipidemia 12/09/2017  ? Diaphoresis 11/26/2017  ? Special screening for malignant neoplasms, colon   ? Epiretinal membrane (ERM) of left eye 09/09/2016  ? Glaucoma, left eye 09/05/2016  ? Seronegative myasthenia gravis ([place]) 08/08/2015  ? Psoriasis 07/06/2015  ? Stress incontinence 07/06/2015  ? Chronic tension-type headache, intractable 05/29/2015  ? Anxiety and depression 04/12/2015  ? Asthma, mild intermittent 04/12/2015  ? Bladder polyp 04/12/2015  ? Carpal tunnel syndrome 04/12/2015  ? Binocular vision disorder with diplopia 04/12/2015  ? Dyslipidemia 04/12/2015  ? H/O: HTN (hypertension) 04/12/2015  ? Hemorrhoids, internal 04/12/2015  ? Neuropathy 04/12/2015  ?  Nuclear sclerotic cataract 04/12/2015  ? Morbid obesity ([place]) 04/12/2015  ? Obstructive apnea 04/12/2015  ? Perennial allergic rhinitis 04/12/2015  ? 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 Type 2 diabetes, controlled, with neuropathy ([place]) 04/12/2015  ? Vitamin D deficiency 04/12/2015  ? Cephalalgia 08/16/2014  ? Lump or mass in breast 06/14/2013  ? Bariatric surgery status 10/12/2012  ? ? ?Past Surgical History:  ?Procedure Laterality Date  ? ABDOMINAL HYSTERECTOMY  1991  ? [place] SURGERY  2014  ? CARPAL TUNNEL RELEASE Left 06/08/2019  ? Procedure: LEFT CARPAL  TUNNEL RELEASE;  Surgeon: Mcarthur Rossetti, MD;  Location: [place];  Service: Orthopedics;  Laterality: Left;  ? CARPAL TUNNEL RELEASE Right 08/19/2019  ? Procedure: RIGHT CARPAL TUNNEL RELEASE;  Surgeon: Mcarthur Rossetti, MD;  Location: [place];  Service: Orthopedics;  Laterality: Right;  ? CATARACT EXTRACTION W/PHACO Left 01/28/2017  ? Procedure: CATARACT EXTRACTION PHACO AND INTRAOCULAR LENS PLACEMENT (IOC);  Surgeon: Birder Robson, MD;  Location: [facility];  Service: Ophthalmology;  Laterality: Left;  [place] 00:37 ?AP% 17.8 ?CDE 6.61 ?Fluid pack lot # [id number]  ? COLONOSCOPY  2012  ? COLONOSCOPY WITH PROPOFOL N/A 09/30/2016  ? Procedure: COLONOSCOPY WITH PROPOFOL;  Surgeon: Lucilla Lame, MD;  Location: [place];  Service: Endoscopy;  Laterality: N/A;  diabetic - diet controlled ?sleep apnea  ? KNEE SURGERY Right 1979-1980  ? OOPHORECTOMY    ? PARS PLANA VITRECTOMY Left 03/27/2016  ? Procedure: PARS PLANA VITRECTOMY WITH 25 GAUGE AND MEMBRANE PEEL, INTERNAL LIMITING MEMBRANE;  Surgeon: Hurman Horn, MD;  Location: [place];  Service: Ophthalmology;  Laterality: Left;  ? THYROID SURGERY    ? BX done was benign  ? UPPER GI ENDOSCOPY    ? VITRECTOMY Left 04/27/2016  ? ? ?Family History  ?Problem Relation Age of Onset  ? Diabetes Mother   ? Hypertension Mother   ? Heart disease Mother 99  ?     stent x 1   ? Migraines Sister   ? Stroke Maternal Grandfather   ? Diabetes Paternal Grandfather   ? Muscular dystrophy Sister   ? Heart attack Maternal Aunt   ? Breast cancer Neg Hx   ? Thyroid disease Neg Hx   ? ? ?Social History  ? ?Tobacco Use  ? Smoking status: Former  ?  Packs/day: 0.50  ?  Years: 3.00  ?  Pack years: 1.50  ?  Types: Cigarettes  ?  Start date: 06/24/1972  ?  Quit date: 06/25/1975  ?  Years since quitting: 46.2  ? Smokeless tobacco: Never  ? Tobacco comments:  ?  smoked as teenager  ?Substance Use Topics  ? Alcohol use: No  ?  Alcohol/week: 0.0 standard  drinks  ? ? ? ?Current Outpatient Medications:  ?  acetaminophen (TYLENOL) 500 MG tablet, Take 1,000 mg by mouth every 6 (six) hours as needed for mild pain. Reported on 07/06/2015, Disp: , Rfl:  ?  Blood Glucose Monitoring Suppl (FREESTYLE FREEDOM) KIT, 100 each by Does not apply route every 30 (thirty) days., Disp: 1 kit, Rfl: 3 ?  brimonidine-timolol (COMBIGAN) 0.2-0.5 % ophthalmic solution, 1 drop daily. , Disp: , Rfl:  ?  budesonide (RHINOCORT AQUA) 32 MCG/ACT nasal spray, Place 2 sprays into both nostrils at bedtime., Disp: 15 mL, Rfl: 2 ?  budesonide-formoterol (SYMBICORT) 80-4.5 MCG/ACT inhaler, Take 2 puffs first thing in am and then another 2 puffs about 12 hours later., Disp:  1 each, Rfl: 12 ?  chlorpheniramine (CHLOR-TRIMETON) 4 MG tablet, Take 4 mg by mouth 2 (two) times daily as needed for allergies., Disp: , Rfl:  ?  cholecalciferol (VITAMIN D3) 25 MCG (1000 UNIT) tablet, Take 1,000 Units by mouth daily., Disp: , Rfl:  ?  Coenzyme Q10 (CO Q 10) 100 MG CAPS, Take 1 capsule by mouth daily., Disp: 30 capsule, Rfl: 2 ?  DULoxetine (CYMBALTA) 60 MG capsule, TAKE 1 CAPSULE (60 MG TOTAL) BY MOUTH DAILY., Disp: 90 capsule, Rfl: 1 ?  glucose blood (FREESTYLE LITE) test strip, 100 each by Other route daily. Use one strip daily for blood glucose testing, Disp: 100 each, Rfl: 12 ?  Lancets (FREESTYLE) lancets, Use one lancet daily for blood glucose testing, Disp: 100 each, Rfl: 12 ?  latanoprost (XALATAN) 0.005 % ophthalmic solution, Place 1 drop into the left eye at bedtime. , Disp: , Rfl:  ?  montelukast (SINGULAIR) 10 MG tablet, TAKE 1 TABLET BY MOUTH EVERYDAY AT BEDTIME, Disp: 90 tablet, Rfl: 1 ?  Multiple Vitamins-Minerals (MULTIVITAMIN WITH MINERALS) tablet, Take 1 tablet by mouth daily., Disp: , Rfl:  ?  Omega-3 Fatty Acids (FISH OIL) 1200 MG CAPS, Take 2 capsules by mouth daily., Disp: , Rfl:  ?  pyridostigmine (MESTINON) 60 MG tablet, Take 1 tablet at 8a, noona, and 4p.  OK to take extra dose as needed.,  Disp: 360 tablet, Rfl: 3 ?  rosuvastatin (CRESTOR) 20 MG tablet, Take 1 tablet (20 mg total) by mouth daily., Disp: 90 tablet, Rfl: 1 ?  telmisartan-hydrochlorothiazide (MICARDIS HCT) 80-25 MG tablet, Take 1 t

## 2021-09-29 LAB — COMPLETE METABOLIC PANEL WITH GFR
AG Ratio: 1.6 (calc) (ref 1.0–2.5)
ALT: 18 U/L (ref 6–29)
AST: 18 U/L (ref 10–35)
Albumin: 4.3 g/dL (ref 3.6–5.1)
Alkaline phosphatase (APISO): 46 U/L (ref 37–153)
BUN: 16 mg/dL (ref 7–25)
CO2: 31 mmol/L (ref 20–32)
Calcium: 9.7 mg/dL (ref 8.6–10.4)
Chloride: 102 mmol/L (ref 98–110)
Creat: 0.68 mg/dL (ref 0.50–1.05)
Globulin: 2.7 g/dL (calc) (ref 1.9–3.7)
Glucose, Bld: 89 mg/dL (ref 65–99)
Potassium: 4.2 mmol/L (ref 3.5–5.3)
Sodium: 140 mmol/L (ref 135–146)
Total Bilirubin: 0.5 mg/dL (ref 0.2–1.2)
Total Protein: 7 g/dL (ref 6.1–8.1)
eGFR: 96 mL/min/{1.73_m2} (ref >=60)

## 2021-09-29 LAB — CBC WITH DIFFERENTIAL/PLATELET
Absolute Monocytes: 834 cells/uL (ref 200–950)
Basophils Absolute: 52 cells/uL (ref 0–200)
Basophils Relative: 0.5 %
Eosinophils Absolute: 144 cells/uL (ref 15–500)
Eosinophils Relative: 1.4 %
HCT: 38.2 % (ref 35.0–45.0)
Hemoglobin: 12.7 g/dL (ref 11.7–15.5)
Lymphs Abs: 3729 cells/uL (ref 850–3900)
MCH: 28.6 pg (ref 27.0–33.0)
MCHC: 33.2 g/dL (ref 32.0–36.0)
MCV: 86 fL (ref 80.0–100.0)
MPV: 10.1 fL (ref 7.5–12.5)
Monocytes Relative: 8.1 %
Neutro Abs: 5541 cells/uL (ref 1500–7800)
Neutrophils Relative %: 53.8 %
Platelets: 340 10*3/uL (ref 140–400)
RBC: 4.44 10*6/uL (ref 3.80–5.10)
RDW: 13.4 % (ref 11.0–15.0)
Total Lymphocyte: 36.2 %
WBC: 10.3 10*3/uL (ref 3.8–10.8)

## 2021-09-29 LAB — VITAMIN B12: Vitamin B-12: >2000 pg/mL — ABNORMAL HIGH (ref 200–1100)

## 2021-09-29 LAB — MICROALBUMIN / CREATININE URINE RATIO
Creatinine, Urine: 76 mg/dL (ref 20–275)
Microalb Creat Ratio: 8 mcg/mg creat (ref <30)
Microalb, Ur: 0.6 mg/dL

## 2021-09-29 LAB — LIPID PANEL
Cholesterol: 163 mg/dL (ref <200)
HDL: 59 mg/dL (ref >=50)
LDL Cholesterol (Calc): 86 mg/dL (calc)
Non-HDL Cholesterol (Calc): 104 mg/dL (calc) (ref <130)
Total CHOL/HDL Ratio: 2.8 (calc) (ref <5.0)
Triglycerides: 85 mg/dL (ref <150)

## 2021-09-29 LAB — HEMOGLOBIN A1C
Hgb A1c MFr Bld: 5.9 % of total Hgb — ABNORMAL HIGH (ref <5.7)
Mean Plasma Glucose: 123 mg/dL
eAG (mmol/L): 6.8 mmol/L

## 2021-09-29 LAB — VITAMIN D 25 HYDROXY (VIT D DEFICIENCY, FRACTURES): Vit D, 25-Hydroxy: 31 ng/mL (ref 30–100)

## 2021-10-04 ENCOUNTER — Ambulatory Visit
Admission: RE | Admit: 2021-10-04 | Discharge: 2021-10-04 | Disposition: A | Discharge status: Home / Self Care | Payer: Medicare Other | Source: Ambulatory Visit | Attending: Family Medicine | Admitting: Family Medicine

## 2021-10-04 DIAGNOSIS — Z1231 Encounter for screening mammogram for malignant neoplasm of breast: Secondary | ICD-10-CM | POA: Insufficient documentation

## 2021-10-12 ENCOUNTER — Ambulatory Visit
Admission: RE | Admit: 2021-10-12 | Discharge: 2021-10-12 | Disposition: A | Discharge status: Home / Self Care | Payer: Medicare Other | Source: Ambulatory Visit | Attending: Endocrinology | Admitting: Endocrinology

## 2021-10-12 ENCOUNTER — Other Ambulatory Visit: Payer: Self-pay | Admitting: Neurology

## 2021-10-12 DIAGNOSIS — E042 Nontoxic multinodular goiter: Secondary | ICD-10-CM | POA: Diagnosis present

## 2021-10-19 ENCOUNTER — Ambulatory Visit: Payer: Medicare Other | Admitting: Internal Medicine

## 2021-10-31 ENCOUNTER — Other Ambulatory Visit (HOSPITAL_COMMUNITY): Payer: Self-pay

## 2021-11-15 ENCOUNTER — Ambulatory Visit: Payer: Medicare Other | Admitting: Internal Medicine

## 2021-11-24 ENCOUNTER — Other Ambulatory Visit: Payer: Self-pay | Admitting: Family Medicine

## 2021-11-24 DIAGNOSIS — E785 Hyperlipidemia, unspecified: Secondary | ICD-10-CM

## 2021-11-26 NOTE — Telephone Encounter (Signed)
Requested Prescriptions  Pending Prescriptions Disp Refills  . rosuvastatin (CRESTOR) 20 MG tablet [Pharmacy Med Name: ROSUVASTATIN CALCIUM 20 MG TAB] 90 tablet 3    Sig: TAKE 1 TABLET BY MOUTH EVERY DAY     Cardiovascular:  Antilipid - Statins 2 Failed - 11/24/2021  1:29 AM      Failed - Lipid Panel in normal range within the last 12 months    Cholesterol, Total  Date Value Ref Range Status  03/05/2018 175 100 - 199 mg/dL Final   Cholesterol  Date Value Ref Range Status  09/28/2021 163 <200 mg/dL Final  05/08/2012 153 0 - 200 mg/dL Final   Ldl Cholesterol, Calc  Date Value Ref Range Status  05/08/2012 77 0 - 100 mg/dL Final   LDL Cholesterol (Calc)  Date Value Ref Range Status  09/28/2021 86 mg/dL (calc) Final    Comment:    Reference range: <100 . Desirable range <100 mg/dL for primary prevention;   <70 mg/dL for patients with CHD or diabetic patients  with > or = 2 CHD risk factors. Marland Kitchen LDL-C is now calculated using the Martin-Hopkins  calculation, which is a validated novel method providing  better accuracy than the Friedewald equation in the  estimation of LDL-C.  Cresenciano Genre et al. Annamaria Helling. 6468;032(12): 2061-2068  (http://education.[facility].com/faq/FAQ164)    HDL Cholesterol  Date Value Ref Range Status  05/08/2012 46 40 - 60 mg/dL Final   HDL  Date Value Ref Range Status  09/28/2021 59 > OR = 50 mg/dL Final  03/05/2018 61 >39 mg/dL Final   Triglycerides  Date Value Ref Range Status  09/28/2021 85 <150 mg/dL Final  05/08/2012 152 0 - 200 mg/dL Final         Passed - Cr in normal range and within 360 days    Creat  Date Value Ref Range Status  09/28/2021 0.68 0.50 - 1.05 mg/dL Final   Creatinine, Urine  Date Value Ref Range Status  09/28/2021 76 20 - 275 mg/dL Final         Passed - Patient is not pregnant      Passed - Valid encounter within last 12 months    Recent Outpatient Visits          1 month ago Type 2 diabetes, controlled, with  neuropathy [facility])   [facility], Drue Stager, MD   6 months ago Type 2 diabetes, controlled, with neuropathy [facility])   [facility] Steele Sizer, MD   10 months ago Type 2 diabetes, controlled, with neuropathy [facility])   [facility] Steele Sizer, MD   11 months ago COVID-44   [facility] Steele Sizer, MD   1 year ago Type 2 diabetes, controlled, with neuropathy [facility])   [facility] Steele Sizer, MD      Future Appointments            In 2 months Ancil Boozer, Drue Stager, MD [facility] [place]   In 8 months  [facility]

## 2022-01-08 ENCOUNTER — Ambulatory Visit: Payer: Medicare Other | Admitting: Endocrinology

## 2022-02-01 ENCOUNTER — Ambulatory Visit: Payer: Medicare Other | Admitting: Family Medicine

## 2022-02-21 NOTE — Progress Notes (Deleted)
Name: Jasmine Woods   MRN: [id number]    DOB: 09/22/1955   Date:02/21/2022       Progress Note  Subjective  Chief Complaint  Follow Up  HPI  MII:she is compliant with Trulicity and doing well, A1C was 5.8 % and we will recheck labs today . . Neuropathy is still present , constant, burning- like, she is taking Duloxetine and helps with symptoms . She could not tolerate Gabapentin or Lyrica and also did not work. She also has associated HTN, obesity and dyslipidemia. She is on ARB and Rosuvastatin. Denies side effects of medication   HTN: she is on Micardis 80/25 mg, bp is slightly higher , likely from weight gain, no chest pain, palpitation . She denies orthostatic changes    Hyperlipidemia: taking Crestor at night but not as compliant with medication at night, advised to take it in am's, she is on higher dose and we will recheck labs today    Asthma Mild Intermittent : she is now seeing Dr. Wert and is back on Symbicort and also taking singulair. She is doing well, no cough, wheezing or sob.    Myasthenia Gravis: diagnosed at [facility] by Dr. Massey  09/2015, seronegative, only causing ptosis and double vision, no other symptoms. She had a negative chest CT for thymoma, and has started on medication ( Mestinon ) She is now seeing Dr. Patel, [facility], stable symptoms . Unchanged     Abnormal CT thyroid: incidental finding of thyroid calcification of left lobe, she saw Endo and had negative biopsy.  Recently seen by Dr. Eliison, had labs and will have repeat US    Bariatric Surgery/Obesity: she had bariatric surgery back in 2014 , her weight was almost 300 lbs, went down to close to 200lbs, but was  gradually gaining weight,she was started on Trulicity 02/2017 and lost 15 lbs , but is gaining it back, she was 241 lbs July 2020 and we started contrave, she had lost 11 lbs on the medication ( 229 lbs ), but ran out of medication and gained it back,  we gave a new rx Feb 2021 she  titrated dose up, but there a gap with PA and she had to re-start medication April  2021 ,weight went from  233 lbs to 229 8 lbs, but gradually gaining weight again, last weight was to 246 lbs, it went up to  261 lbs  we resumed Trulicity Dec 2022 and weight today is down to 254 lbs we will adjust dose to 1.5 mg today    OSA: she is compliant with CPAP, she is seeing pulmonologist again.   Patient Active Problem List   Diagnosis Date Noted   Carpal tunnel syndrome, right upper limb 08/19/2019   Carpal tunnel syndrome, left upper limb 06/08/2019   Multinodular goiter 11/25/2018   Essential hypertension 11/11/2018   DOE (dyspnea on exertion) 04/15/2018   Hyperlipidemia 12/09/2017   Diaphoresis 11/26/2017   Special screening for malignant neoplasms, colon    Epiretinal membrane (ERM) of left eye 09/09/2016   Glaucoma, left eye 09/05/2016   Seronegative myasthenia gravis (HCC) 08/08/2015   Psoriasis 07/06/2015   Stress incontinence 07/06/2015   Chronic tension-type headache, intractable 05/29/2015   Anxiety and depression 04/12/2015   Asthma, mild intermittent 04/12/2015   Bladder polyp 04/12/2015   Carpal tunnel syndrome 04/12/2015   Binocular vision disorder with diplopia 04/12/2015   Dyslipidemia 04/12/2015   H/O: HTN (hypertension) 04/12/2015   Hemorrhoids, internal 04/12/2015   Neuropathy 04/12/2015     Nuclear sclerotic cataract 04/12/2015   Morbid obesity (HCC) 04/12/2015   Obstructive apnea 04/12/2015   Perennial allergic rhinitis 04/12/2015   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/12/2015   Type 2 diabetes, controlled, with neuropathy (HCC) 04/12/2015   Vitamin D deficiency 04/12/2015   Cephalalgia 08/16/2014   Lump or mass in breast 06/14/2013   Bariatric surgery status 10/12/2012    Past Surgical History:  Procedure Laterality Date   ABDOMINAL HYSTERECTOMY  1991   BARIATRIC SURGERY  2014   CARPAL TUNNEL RELEASE Left 06/08/2019   Procedure: LEFT CARPAL  TUNNEL RELEASE;  Surgeon: Blackman, Christopher Y, MD;  Location: [facility];  Service: Orthopedics;  Laterality: Left;   CARPAL TUNNEL RELEASE Right 08/19/2019   Procedure: RIGHT CARPAL TUNNEL RELEASE;  Surgeon: Blackman, Christopher Y, MD;  Location: [facility];  Service: Orthopedics;  Laterality: Right;   CATARACT EXTRACTION W/PHACO Left 01/28/2017   Procedure: CATARACT EXTRACTION PHACO AND INTRAOCULAR LENS PLACEMENT (IOC);  Surgeon: Porfilio, William, MD;  Location: [facility];  Service: Ophthalmology;  Laterality: Left;  US 00:37 AP% 17.8 CDE 6.61 Fluid pack lot # [id number]   COLONOSCOPY  2012   COLONOSCOPY WITH PROPOFOL N/A 09/30/2016   Procedure: COLONOSCOPY WITH PROPOFOL;  Surgeon: Darren Wohl, MD;  Location: [facility];  Service: Endoscopy;  Laterality: N/A;  diabetic - diet controlled sleep apnea   KNEE SURGERY Right 1979-1980   OOPHORECTOMY     PARS PLANA VITRECTOMY Left 03/27/2016   Procedure: PARS PLANA VITRECTOMY WITH 25 GAUGE AND MEMBRANE PEEL, INTERNAL LIMITING MEMBRANE;  Surgeon: Gary A Rankin, MD;  Location: [facility];  Service: Ophthalmology;  Laterality: Left;   THYROID SURGERY     BX done was benign   UPPER GI ENDOSCOPY     VITRECTOMY Left 04/27/2016    Family History  Problem Relation Age of Onset   Diabetes Mother    Hypertension Mother    Heart disease Mother 70       stent x 1    Migraines Sister    Stroke Maternal Grandfather    Diabetes Paternal Grandfather    Muscular dystrophy Sister    Heart attack Maternal Aunt    Breast cancer Neg Hx    Thyroid disease Neg Hx     Social History   Tobacco Use   Smoking status: Former    Packs/day: 0.50    Years: 3.00    Total pack years: 1.50    Types: Cigarettes    Start date: 06/24/1972    Quit date: 06/25/1975    Years since quitting: 46.6   Smokeless tobacco: Never   Tobacco comments:    smoked as teenager  Substance Use Topics   Alcohol use: No    Alcohol/week: 0.0 standard  drinks of alcohol     Current Outpatient Medications:    acetaminophen (TYLENOL) 500 MG tablet, Take 1,000 mg by mouth every 6 (six) hours as needed for mild pain. Reported on 07/06/2015, Disp: , Rfl:    albuterol (VENTOLIN HFA) 108 (90 Base) MCG/ACT inhaler, INHALE 2 PUFFS BY MOUTH EVERY 4 HOURS AS NEEDED FOR WHEEZING OR SHORTNESS OF BREATH., Disp: 18 g, Rfl: 0   Blood Glucose Monitoring Suppl (FREESTYLE FREEDOM) KIT, 100 each by Does not apply route every 30 (thirty) days., Disp: 1 kit, Rfl: 3   brimonidine-timolol (COMBIGAN) 0.2-0.5 % ophthalmic solution, 1 drop daily. , Disp: , Rfl:    budesonide (RHINOCORT AQUA) 32 MCG/ACT nasal spray, Place 2 sprays   into both nostrils at bedtime., Disp: 15 mL, Rfl: 2   budesonide-formoterol (SYMBICORT) 80-4.5 MCG/ACT inhaler, Take 2 puffs first thing in am and then another 2 puffs about 12 hours later., Disp: 1 each, Rfl: 12   chlorpheniramine (CHLOR-TRIMETON) 4 MG tablet, Take 4 mg by mouth 2 (two) times daily as needed for allergies., Disp: , Rfl:    cholecalciferol (VITAMIN D3) 25 MCG (1000 UNIT) tablet, Take 1,000 Units by mouth daily., Disp: , Rfl:    Coenzyme Q10 (CO Q 10) 100 MG CAPS, Take 1 capsule by mouth daily., Disp: 30 capsule, Rfl: 2   Dulaglutide (TRULICITY) 1.5 MG/0.5ML SOPN, Inject 1.5 mg into the skin once a week., Disp: 6 mL, Rfl: 1   DULoxetine (CYMBALTA) 60 MG capsule, TAKE 1 CAPSULE (60 MG TOTAL) BY MOUTH DAILY., Disp: 90 capsule, Rfl: 1   glucose blood (FREESTYLE LITE) test strip, 100 each by Other route daily. Use one strip daily for blood glucose testing, Disp: 100 each, Rfl: 12   Lancets (FREESTYLE) lancets, Use one lancet daily for blood glucose testing, Disp: 100 each, Rfl: 12   latanoprost (XALATAN) 0.005 % ophthalmic solution, Place 1 drop into the left eye at bedtime. , Disp: , Rfl:    montelukast (SINGULAIR) 10 MG tablet, TAKE 1 TABLET BY MOUTH EVERYDAY AT BEDTIME, Disp: 90 tablet, Rfl: 1   Multiple Vitamins-Minerals  (MULTIVITAMIN WITH MINERALS) tablet, Take 1 tablet by mouth daily., Disp: , Rfl:    Omega-3 Fatty Acids (FISH OIL) 1200 MG CAPS, Take 2 capsules by mouth daily., Disp: , Rfl:    pyridostigmine (MESTINON) 60 MG tablet, TAKE 1 TABLET AT 8 AM, NOON, AND AT 4 PM. OK TO TAKE EXTRA DOSE AS NEEDED., Disp: 360 tablet, Rfl: 3   rosuvastatin (CRESTOR) 20 MG tablet, TAKE 1 TABLET BY MOUTH EVERY DAY, Disp: 90 tablet, Rfl: 3   telmisartan-hydrochlorothiazide (MICARDIS HCT) 80-25 MG tablet, Take 1 tablet by mouth daily., Disp: 90 tablet, Rfl: 1  Allergies  Allergen Reactions   Aspirin Swelling    Upset stomach  Upset stomach    Azithromycin     Contraindicated in Myasthenia    Ace Inhibitors     cough   Amlodipine     Edema of feet and hands   Gabapentin     anxiety   Pregabalin Other (See Comments)    anxiety    I personally reviewed active problem list, medication list, allergies, family history, social history, health maintenance with the patient/caregiver today.   ROS  ***  Objective  There were no vitals filed for this visit.  There is no height or weight on file to calculate BMI.  Physical Exam ***  No results found for this or any previous visit (from the past 2160 hour(s)).  Diabetic Foot Exam: Diabetic Foot Exam - Simple   No data filed    ***  PHQ2/9:    09/28/2021   10:30 AM 08/21/2021    9:00 AM 05/30/2021    9:57 AM 01/29/2021   10:36 AM 12/22/2020   11:59 AM  Depression screen PHQ 2/9  Decreased Interest 0 0 1 0 0  Down, Depressed, Hopeless 0 0 0 0 0  PHQ - 2 Score 0 0 1 0 0  Altered sleeping 0  3    Tired, decreased energy 0  0    Change in appetite 0  0    Feeling bad or failure about yourself  0  0    Trouble concentrating   0  0    Moving slowly or fidgety/restless 0  0    Suicidal thoughts 0  0    PHQ-9 Score 0  4      phq 9 is {gen pos [id number]}   Fall Risk:    09/28/2021   10:30 AM 08/21/2021    9:03 AM 05/30/2021    9:57 AM 01/29/2021   10:36 AM  12/22/2020   11:59 AM  Fall Risk   Falls in the past year? 0 0 0 0 0  Number falls in past yr: 0 0 0 0 0  Injury with Fall? 0 0 0 0 0  Risk for fall due to : No Fall Risks No Fall Risks No Fall Risks    Follow up Falls prevention discussed Falls prevention discussed Falls prevention discussed  Falls evaluation completed      Functional Status Survey:      Assessment & Plan  *** There are no diagnoses linked to this encounter.

## 2022-02-22 ENCOUNTER — Ambulatory Visit: Payer: Medicare Other | Admitting: Family Medicine

## 2022-02-22 DIAGNOSIS — E1140 Type 2 diabetes mellitus with diabetic neuropathy, unspecified: Secondary | ICD-10-CM

## 2022-03-04 ENCOUNTER — Encounter: Payer: Self-pay | Admitting: Family Medicine

## 2022-03-04 ENCOUNTER — Other Ambulatory Visit: Payer: Self-pay | Admitting: Family Medicine

## 2022-03-04 DIAGNOSIS — Z8249 Family history of ischemic heart disease and other diseases of the circulatory system: Secondary | ICD-10-CM

## 2022-03-08 ENCOUNTER — Encounter: Payer: Self-pay | Admitting: Neurology

## 2022-03-08 ENCOUNTER — Ambulatory Visit (INDEPENDENT_AMBULATORY_CARE_PROVIDER_SITE_OTHER): Payer: Medicare Other | Admitting: Neurology

## 2022-03-08 VITALS — BP 150/85 | HR 75 | Resp 18 | Ht 62.0 in | Wt 255.0 lb

## 2022-03-08 DIAGNOSIS — G250 Essential tremor: Secondary | ICD-10-CM | POA: Diagnosis not present

## 2022-03-08 DIAGNOSIS — G7000 Myasthenia gravis without (acute) exacerbation: Secondary | ICD-10-CM

## 2022-03-08 NOTE — Patient Instructions (Signed)
You can try using weighted gloves for tremor and see if this helps, especially when doing your crafts  Continue mestinon '60mg'$  three times daily  I will see you back in 1 year

## 2022-03-08 NOTE — Progress Notes (Signed)
Follow-up Visit   Date: 01/00/71   Jasmine Woods MRN: [id number] DOB: 12-Sep-1955   Interim History: Jasmine Woods is a 66 y.o. right-handed Caucasian female with hyperlipidemia, asthma, and diet-controlled diabetes  returning to the clinic for follow-up of seronegative myasthenia gravis and carpal tunnel syndrome.  The patient was accompanied to the clinic by self.  IMPRESSION/PLAN: Seronegative ocular myasthenia gravis without exacerbation (diagnosed 2017 with [facility]EMG), stable  - Continue mestinon 33m 8a, noon, and 4p  2.  Essential tremor, mild worsening.   - Discussed started medications, but opted to hold at this time  - She may try using weighted gloves for tremor  3.  Bilateral carpal tunnel syndrome s/p release   Return to clinic 1 year  -------------------------------------------------------------------------------------- UPDATE 03/08/2022:  She is here for 1 year follow-up.  She has noticed worsening tremor and has difficulty with fine motor tasks, especially when doing crafts.  No issues with her myasthenia.  Specifically, she denies droopiness of the eyelid or double vision.  Her husband passed away in [id number] heart failure.  Daughter lives next door and she has good social support from her children.    Medications:  Current Outpatient Medications on File Prior to Visit  Medication Sig Dispense Refill   acetaminophen (TYLENOL) 500 MG tablet Take 1,000 mg by mouth every 6 (six) hours as needed for mild pain. Reported on 07/06/2015     Blood Glucose Monitoring Suppl (FREESTYLE FREEDOM) KIT 100 each by Does not apply route every 30 (thirty) days. 1 kit 3   brimonidine-timolol (COMBIGAN) 0.2-0.5 % ophthalmic solution 1 drop daily.      budesonide (RHINOCORT AQUA) 32 MCG/ACT nasal spray Place 2 sprays into both nostrils at bedtime. 15 mL 2   budesonide-formoterol (SYMBICORT) 80-4.5 MCG/ACT inhaler Take 2 puffs first thing in am and then another 2  puffs about 12 hours later. 1 each 12   chlorpheniramine (CHLOR-TRIMETON) 4 MG tablet Take 4 mg by mouth 2 (two) times daily as needed for allergies.     cholecalciferol (VITAMIN D3) 25 MCG (1000 UNIT) tablet Take 1,000 Units by mouth daily.     Coenzyme Q10 (CO Q 10) 100 MG CAPS Take 1 capsule by mouth daily. 30 capsule 2   Dulaglutide (TRULICITY) 1.5 MPQ/9.8YMSOPN Inject 1.5 mg into the skin once a week. 6 mL 1   DULoxetine (CYMBALTA) 60 MG capsule TAKE 1 CAPSULE (60 MG TOTAL) BY MOUTH DAILY. 90 capsule 1   glucose blood (FREESTYLE LITE) test strip 100 each by Other route daily. Use one strip daily for blood glucose testing 100 each 12   Lancets (FREESTYLE) lancets Use one lancet daily for blood glucose testing 100 each 12   latanoprost (XALATAN) 0.005 % ophthalmic solution Place 1 drop into the left eye at bedtime.      montelukast (SINGULAIR) 10 MG tablet TAKE 1 TABLET BY MOUTH EVERYDAY AT BEDTIME 90 tablet 1   Multiple Vitamins-Minerals (MULTIVITAMIN WITH MINERALS) tablet Take 1 tablet by mouth daily.     Omega-3 Fatty Acids (FISH OIL) 1200 MG CAPS Take 2 capsules by mouth daily.     pyridostigmine (MESTINON) 60 MG tablet TAKE 1 TABLET AT 8 AM, NOON, AND AT 4 PM. OK TO TAKE EXTRA DOSE AS NEEDED. 360 tablet 3   rosuvastatin (CRESTOR) 20 MG tablet TAKE 1 TABLET BY MOUTH EVERY DAY 90 tablet 3   telmisartan-hydrochlorothiazide (MICARDIS HCT) 80-25 MG tablet Take 1 tablet by mouth daily. 90 tablet 1  albuterol (VENTOLIN HFA) 108 (90 Base) MCG/ACT inhaler INHALE 2 PUFFS BY MOUTH EVERY 4 HOURS AS NEEDED FOR WHEEZING OR SHORTNESS OF BREATH. 18 g 0   [DISCONTINUED] Fluticasone-Salmeterol (ADVAIR) 100-50 MCG/DOSE AEPB Inhale 1 puff into the lungs 2 (two) times daily. 1 each 2   No current facility-administered medications on file prior to visit.    Allergies:  Allergies  Allergen Reactions   Aspirin Swelling    Upset stomach  Upset stomach    Azithromycin     Contraindicated in Myasthenia     Ace Inhibitors     cough   Amlodipine     Edema of feet and hands   Gabapentin     anxiety   Pregabalin Other (See Comments)    anxiety     Vital Signs:  BP (!) 150/85   Pulse 75   Resp 18   Ht '5\' 2"'  (1.575 m)   Wt 255 lb (115.7 kg)   SpO2 98%   BMI 46.64 kg/m   Neurological Exam: MENTAL STATUS including orientation to time, place, person, recent and remote memory, attention span and concentration, language, and fund of knowledge is normal.  Speech is not dysarthric.  CRANIAL NERVES:  No visual field defects.  Pupils equal round and reactive to light.  Mildly restricted lateral gaze in the right eye, otherwise normal conjugate, extra-ocular eye movements in all directions of gaze.  No ptosis.  Face is symmetric.  Facial muscles are 5/5.    MOTOR:  Motor strength is 5/5 in all extremities.  There is mild intention tremor bilaterally in the hands, no rest tremor.    MSRs:  Reflexes are 2+/4 throughout.  SENSORY:  Intact to vibration throughout   COORDINATION/GAIT:   Gait mildly-wide based due to body habitus, stable and unassisted  Data: NCS/EMG of the arms 04/07/2019:  Bilateral median neuropathy at or distal to the wrist, consistent with a clinical diagnosis of carpal tunnel syndrome.  Overall, these findings are moderate in degree electrically and worse on the left.    Thank you for allowing me to participate in patient's care.  If I can answer any additional questions, I would be pleased to do so.    Sincerely,    Aldous Housel K. Posey Pronto, DO

## 2022-03-12 NOTE — Progress Notes (Unsigned)
Name: Jasmine Woods   MRN: [id number]    DOB: 1955/12/07   Date:03/13/2022       Progress Note  Subjective  Chief Complaint  Follow up   HPI  DMII: A1C a little higher today at 6.2 %, she has been out of Trulicity since the cost of medication went up in June  . Neuropathy is still present , constant, burning- like, she is taking Duloxetine and helps with symptoms . She could not tolerate Gabapentin or Lyrica and also did not work. She also has associated HTN, obesity and dyslipidemia. She is on ARB and Rosuvastatin.   HTN: she is on Micardis 80/25 mg, bp is at goal today, it was high with a automatic machine at neurologist office last week. Taking medication and denies side effects    Hyperlipidemia: taking Crestor at night but not as compliant with medication at night, advised to take it in am's, she is on higher dose but LDL still 86, discussed adding Zetia or go up on Crestor to 40 mg but she would like to hold off for now    Asthma Mild Intermittent : she used to see Dr. Melvyn Novas, she is taking Symbicort  prn and also taking singulair. She is doing well, no cough, wheezing or sob.    Myasthenia Gravis: diagnosed at [facility] by Dr. Burnett Harry  09/2015, seronegative, only causing ptosis and double vision, no other symptoms. She had a negative chest CT for thymoma, and has started on medication ( Mestinon ) She is now seeing Dr. Posey Pronto, [facility], stable symptoms . Stable     Abnormal CT thyroid: incidental finding of thyroid calcification of left lobe, she saw Endo and had negative biopsy.  She had a repeat US done 09/2021 that multinodular goiter and will follow up yearly with Endo    Bariatric Surgery/Obesity: she had bariatric surgery back in 2014 , her weight was almost 300 lbs, went down to close to 200lbs, but was  gradually gaining weight,she was started on Trulicity 08/5007 and lost 15 lbs , but is gaining it back, she was 241 lbs July 2020 and we started [place], she had lost  11 lbs on the medication ( 229 lbs ), but ran out of medication and gained it back,  we gave a new rx Feb 2021 she titrated dose up, but there a gap with PA and she had to re-start medication April  2021 ,weight went from  233 lbs to 229 8 lbs, but gradually gaining weight again, weight was to 246 lbs, it went up to  261 lbs  we resumed Trulicity Dec 3818 and weight went down to 254 lbs we tried to adjust dose to 1.5 mg however the cost went up and she has been out of medication since June 2023. Today weight is stable at 252.3 lbs. She asked to try going back on lower dose of Trulicty  OSA: she is compliant with CPAP, Dr. Melvyn Novas send the supplies for her   Patient Active Problem List   Diagnosis Date Noted   Carpal tunnel syndrome, right upper limb 08/19/2019   Carpal tunnel syndrome, left upper limb 06/08/2019   Multinodular goiter 11/25/2018   Essential hypertension 11/11/2018   DOE (dyspnea on exertion) 04/15/2018   Hyperlipidemia 12/09/2017   Diaphoresis 11/26/2017   Special screening for malignant neoplasms, colon    Epiretinal membrane (ERM) of left eye 09/09/2016   Glaucoma, left eye 09/05/2016   Seronegative myasthenia gravis ([place]) 08/08/2015   Psoriasis 07/06/2015  Stress incontinence 07/06/2015   Chronic tension-type headache, intractable 05/29/2015   Anxiety and depression 04/12/2015   Asthma, mild intermittent 04/12/2015   Bladder polyp 04/12/2015   Carpal tunnel syndrome 04/12/2015   Binocular vision disorder with diplopia 04/12/2015   Dyslipidemia 04/12/2015   H/O: HTN (hypertension) 04/12/2015   Hemorrhoids, internal 04/12/2015   Neuropathy 04/12/2015   Nuclear sclerotic cataract 04/12/2015   Morbid obesity ([place]) 04/12/2015   Obstructive apnea 04/12/2015   Perennial allergic rhinitis 04/12/2015   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/12/2015   Type 2 diabetes, controlled, with neuropathy ([place]) 04/12/2015   Vitamin D deficiency 04/12/2015   Cephalalgia  08/16/2014   Bariatric surgery status 10/12/2012    Past Surgical History:  Procedure Laterality Date   ABDOMINAL HYSTERECTOMY  1991   BARIATRIC SURGERY  2014   CARPAL TUNNEL RELEASE Left 06/08/2019   Procedure: LEFT CARPAL TUNNEL RELEASE;  Surgeon: Mcarthur Rossetti, MD;  Location: [place];  Service: Orthopedics;  Laterality: Left;   CARPAL TUNNEL RELEASE Right 08/19/2019   Procedure: RIGHT CARPAL TUNNEL RELEASE;  Surgeon: Mcarthur Rossetti, MD;  Location: [place];  Service: Orthopedics;  Laterality: Right;   CATARACT EXTRACTION W/PHACO Left 01/28/2017   Procedure: CATARACT EXTRACTION PHACO AND INTRAOCULAR LENS PLACEMENT (IOC);  Surgeon: Birder Robson, MD;  Location: [facility];  Service: Ophthalmology;  Laterality: Left;  [place] 00:37 AP% 17.8 CDE 6.61 Fluid pack lot # [id number]   COLONOSCOPY  2012   COLONOSCOPY WITH PROPOFOL N/A 09/30/2016   Procedure: COLONOSCOPY WITH PROPOFOL;  Surgeon: Lucilla Lame, MD;  Location: [place];  Service: Endoscopy;  Laterality: N/A;  diabetic - diet controlled sleep apnea   KNEE SURGERY Right 1979-1980   OOPHORECTOMY     PARS PLANA VITRECTOMY Left 03/27/2016   Procedure: PARS PLANA VITRECTOMY WITH 25 GAUGE AND MEMBRANE PEEL, INTERNAL LIMITING MEMBRANE;  Surgeon: Hurman Horn, MD;  Location: [place];  Service: Ophthalmology;  Laterality: Left;   THYROID SURGERY     BX done was benign   UPPER GI ENDOSCOPY     VITRECTOMY Left 04/27/2016    Family History  Problem Relation Age of Onset   Diabetes Mother    Hypertension Mother    Heart disease Mother 47       stent x 1    Migraines Sister    Stroke Maternal Grandfather    Diabetes Paternal Grandfather    Muscular dystrophy Sister    Heart attack Maternal Aunt    Breast cancer Neg Hx    Thyroid disease Neg Hx     Social History   Tobacco Use   Smoking status: Former    Packs/day: 0.50    Years: 3.00    Total pack years: 1.50    Types:  Cigarettes    Start date: 06/24/1972    Quit date: 06/25/1975    Years since quitting: 46.7   Smokeless tobacco: Never   Tobacco comments:    smoked as teenager  Substance Use Topics   Alcohol use: No    Alcohol/week: 0.0 standard drinks of alcohol     Current Outpatient Medications:    acetaminophen (TYLENOL) 500 MG tablet, Take 1,000 mg by mouth every 6 (six) hours as needed for mild pain. Reported on 07/06/2015, Disp: , Rfl:    Blood Glucose Monitoring Suppl (FREESTYLE FREEDOM) KIT, 100 each by Does not apply route every 30 (thirty) days., Disp: 1 kit, Rfl: 3   brimonidine-timolol (COMBIGAN) 0.2-0.5 %  ophthalmic solution, 1 drop daily. , Disp: , Rfl:    budesonide (RHINOCORT AQUA) 32 MCG/ACT nasal spray, Place 2 sprays into both nostrils at bedtime., Disp: 15 mL, Rfl: 2   budesonide-formoterol (SYMBICORT) 80-4.5 MCG/ACT inhaler, Take 2 puffs first thing in am and then another 2 puffs about 12 hours later., Disp: 1 each, Rfl: 12   chlorpheniramine (CHLOR-TRIMETON) 4 MG tablet, Take 4 mg by mouth 2 (two) times daily as needed for allergies., Disp: , Rfl:    cholecalciferol (VITAMIN D3) 25 MCG (1000 UNIT) tablet, Take 1,000 Units by mouth daily., Disp: , Rfl:    Coenzyme Q10 (CO Q 10) 100 MG CAPS, Take 1 capsule by mouth daily., Disp: 30 capsule, Rfl: 2   Dulaglutide (TRULICITY) 3.90 ZE/0.9QZ SOPN, Inject 0.75 mg into the skin once a week., Disp: 2 mL, Rfl: 5   glucose blood (FREESTYLE LITE) test strip, 100 each by Other route daily. Use one strip daily for blood glucose testing, Disp: 100 each, Rfl: 12   Lancets (FREESTYLE) lancets, Use one lancet daily for blood glucose testing, Disp: 100 each, Rfl: 12   latanoprost (XALATAN) 0.005 % ophthalmic solution, Place 1 drop into the left eye at bedtime. , Disp: , Rfl:    Multiple Vitamins-Minerals (MULTIVITAMIN WITH MINERALS) tablet, Take 1 tablet by mouth daily., Disp: , Rfl:    Omega-3 Fatty Acids (FISH OIL) 1200 MG CAPS, Take 2 capsules by mouth  daily., Disp: , Rfl:    pyridostigmine (MESTINON) 60 MG tablet, TAKE 1 TABLET AT 8 AM, NOON, AND AT 4 PM. OK TO TAKE EXTRA DOSE AS NEEDED., Disp: 360 tablet, Rfl: 3   rosuvastatin (CRESTOR) 20 MG tablet, TAKE 1 TABLET BY MOUTH EVERY DAY, Disp: 90 tablet, Rfl: 3   albuterol (VENTOLIN HFA) 108 (90 Base) MCG/ACT inhaler, INHALE 2 PUFFS BY MOUTH EVERY 4 HOURS AS NEEDED FOR WHEEZING OR SHORTNESS OF BREATH., Disp: 18 g, Rfl: 0   DULoxetine (CYMBALTA) 60 MG capsule, TAKE 1 CAPSULE (60 MG TOTAL) BY MOUTH DAILY., Disp: 90 capsule, Rfl: 1   montelukast (SINGULAIR) 10 MG tablet, TAKE 1 TABLET BY MOUTH EVERYDAY AT BEDTIME, Disp: 90 tablet, Rfl: 1   telmisartan-hydrochlorothiazide (MICARDIS HCT) 80-25 MG tablet, Take 1 tablet by mouth daily., Disp: 90 tablet, Rfl: 1  Allergies  Allergen Reactions   Aspirin Swelling    Upset stomach  Upset stomach    Azithromycin     Contraindicated in Myasthenia    Ace Inhibitors     cough   Amlodipine     Edema of feet and hands   Gabapentin     anxiety   Pregabalin Other (See Comments)    anxiety    I personally reviewed active problem list, medication list, allergies, family history, social history, health maintenance with the patient/caregiver today.   ROS  Ten systems reviewed and is negative except as mentioned in HPI   Objective  Vitals:   03/13/22 1351  BP: 132/80  Pulse: 81  Resp: 16  Temp: 97.9 F (36.6 C)  TempSrc: Oral  SpO2: 99%  Weight: 252 lb 4.8 oz (114.4 kg)  Height: 5' 2" (1.575 m)    Body mass index is 46.15 kg/m.  Physical Exam  Constitutional: Patient appears well-developed and well-nourished. Obese  No distress.  HEENT: head atraumatic, normocephalic, pupils equal and reactive to light, neck supple Cardiovascular: Normal rate, regular rhythm and normal heart sounds.  No murmur heard. No BLE edema. Pulmonary/Chest: Effort normal and breath sounds normal.  No respiratory distress. Abdominal: Soft.  There is no  tenderness. Psychiatric: Patient has a normal mood and affect. behavior is normal. Judgment and thought content normal.   Recent Results (from the past 2160 hour(s))  POCT HgB A1C     Status: Abnormal   Collection Time: 03/13/22  2:05 PM  Result Value Ref Range   Hemoglobin A1C 6.2 (A) 4.0 - 5.6 %   HbA1c POC (<> result, manual entry)     HbA1c, POC (prediabetic range)     HbA1c, POC (controlled diabetic range)        PHQ2/9:    03/13/2022    1:50 PM 09/28/2021   10:30 AM 08/21/2021    9:00 AM 05/30/2021    9:57 AM 01/29/2021   10:36 AM  Depression screen PHQ 2/9  Decreased Interest 1 0 0 1 0  Down, Depressed, Hopeless 1 0 0 0 0  PHQ - 2 Score 2 0 0 1 0  Altered sleeping 1 0  3   Tired, decreased energy 1 0  0   Change in appetite 0 0  0   Feeling bad or failure about yourself  0 0  0   Trouble concentrating 0 0  0   Moving slowly or fidgety/restless 0 0  0   Suicidal thoughts 0 0  0   PHQ-9 Score 4 0  4   Difficult doing work/chores Somewhat difficult        phq 9 is positive - grieving    Fall Risk:    03/13/2022    1:50 PM 03/08/2022    2:24 PM 09/28/2021   10:30 AM 08/21/2021    9:03 AM 05/30/2021    9:57 AM  Fall Risk   Falls in the past year? 0 0 0 0 0  Number falls in past yr: 0 0 0 0 0  Injury with Fall? 0 0 0 0 0  Risk for fall due to : No Fall Risks  No Fall Risks No Fall Risks No Fall Risks  Follow up Falls prevention discussed;Education provided  Falls prevention discussed Falls prevention discussed Falls prevention discussed     Functional Status Survey: Is the patient deaf or have difficulty hearing?: No Does the patient have difficulty seeing, even when wearing glasses/contacts?: No Does the patient have difficulty concentrating, remembering, or making decisions?: No Does the patient have difficulty walking or climbing stairs?: No Does the patient have difficulty dressing or bathing?: No Does the patient have difficulty doing errands alone such as  visiting a doctor's office or shopping?: No    Assessment & Plan  1. Type 2 diabetes, controlled, with neuropathy ([place])  - POCT HgB A1C - Ambulatory referral to Ophthalmology - DULoxetine (CYMBALTA) 60 MG capsule; TAKE 1 CAPSULE (60 MG TOTAL) BY MOUTH DAILY.  Dispense: 90 capsule; Refill: 1  2. Needs flu shot  She will get at local pharmacy   3. Hypertension, benign  - telmisartan-hydrochlorothiazide (MICARDIS HCT) 80-25 MG tablet; Take 1 tablet by mouth daily.  Dispense: 90 tablet; Refill: 1  4. Diabetes mellitus with coincident hypertension (H[facility])  - telmisartan-hydrochlorothiazide (MICARDIS HCT) 80-25 MG tablet; Take 1 tablet by mouth daily.  Dispense: 90 tablet; Refill: 1  5. Perennial allergic rhinitis  - montelukast (SINGULAIR) 10 MG tablet; TAKE 1 TABLET BY MOUTH EVERYDAY AT BEDTIME  Dispense: 90 tablet; Refill: 1  6. Peripheral polyneuropathy  - DULoxetine (CYMBALTA) 60 MG capsule; TAKE 1 CAPSULE (60 MG TOTAL) BY MOUTH DAILY.  Dispense:  90 capsule; Refill: 1

## 2022-03-13 ENCOUNTER — Ambulatory Visit
Admission: RE | Admit: 2022-03-13 | Discharge: 2022-03-13 | Disposition: A | Discharge status: Home / Self Care | Payer: Medicare Other | Source: Ambulatory Visit | Attending: Family Medicine | Admitting: Family Medicine

## 2022-03-13 ENCOUNTER — Ambulatory Visit (INDEPENDENT_AMBULATORY_CARE_PROVIDER_SITE_OTHER): Payer: Medicare Other | Admitting: Family Medicine

## 2022-03-13 ENCOUNTER — Encounter: Payer: Self-pay | Admitting: Family Medicine

## 2022-03-13 VITALS — BP 132/80 | HR 81 | Temp 97.9°F | Resp 16 | Ht 62.0 in | Wt 252.3 lb

## 2022-03-13 DIAGNOSIS — G629 Polyneuropathy, unspecified: Secondary | ICD-10-CM

## 2022-03-13 DIAGNOSIS — E119 Type 2 diabetes mellitus without complications: Secondary | ICD-10-CM

## 2022-03-13 DIAGNOSIS — I10 Essential (primary) hypertension: Secondary | ICD-10-CM

## 2022-03-13 DIAGNOSIS — E1140 Type 2 diabetes mellitus with diabetic neuropathy, unspecified: Secondary | ICD-10-CM

## 2022-03-13 DIAGNOSIS — J3089 Other allergic rhinitis: Secondary | ICD-10-CM

## 2022-03-13 DIAGNOSIS — Z23 Encounter for immunization: Secondary | ICD-10-CM

## 2022-03-13 DIAGNOSIS — Z8249 Family history of ischemic heart disease and other diseases of the circulatory system: Secondary | ICD-10-CM | POA: Insufficient documentation

## 2022-03-13 LAB — POCT GLYCOSYLATED HEMOGLOBIN (HGB A1C): Hemoglobin A1C: 6.2 % — AB (ref 4.0–5.6)

## 2022-03-13 MED ORDER — TELMISARTAN-HCTZ 80-25 MG PO TABS: 1.0000 | ORAL_TABLET | Freq: Every day | ORAL | 1 refills | Status: DC

## 2022-03-13 MED ORDER — MONTELUKAST SODIUM 10 MG PO TABS: ORAL_TABLET | ORAL | 1 refills | Status: DC

## 2022-03-13 MED ORDER — DULOXETINE HCL 60 MG PO CPEP: ORAL_CAPSULE | Freq: Every day | ORAL | 1 refills | Status: DC

## 2022-03-13 MED ORDER — TRULICITY 0.75 MG/0.5ML ~~LOC~~ SOAJ: 0.7500 mg | SUBCUTANEOUS | 5 refills | Status: DC

## 2022-03-13 NOTE — Patient Instructions (Signed)
High dose Flu RSV and COVID booster

## 2022-03-29 LAB — HM DIABETES EYE EXAM

## 2022-07-11 NOTE — Progress Notes (Signed)
Name: Jasmine Woods   MRN: [id number]    DOB: August 18, 1955   Date:07/12/2022       Progress Note  Subjective  Chief Complaint  Follow Up  HPI  DMII: A1C a little higher today at 6.2 %, she has been out of Trulicity since the cost of medication went up in June  . Neuropathy is still present , constant, burning- like, she takes Duloxetine  She could not tolerate Gabapentin or Lyrica and also did not work. She also has associated HTN, obesity and dyslipidemia. She is on ARB and Rosuvastatin.   HTN: she is on Micardis 80/25 mg, bp has been at goal, no chest pain, palpitation or SOB    Hyperlipidemia: taking Crestor at night but not as compliant with medication at night, advised to take it in am's, she is on higher dose but LDL still 86, discussed adding Zetia or go up on Crestor to 40 mg but she would like to hold off for now We will recheck labs next visit and adjust medications if needed    Asthma Mild Intermittent : she used to see Dr. Melvyn Novas, she is taking Symbicort  prn and also taking singulair. She is doing well, no cough, wheezing or sob. She states Symbicort no longer cover by insurance and will discuss it with Dr. Melvyn Novas   AR: she has nasal congestion daily but worse at night, she has a nasal spray but does not seem to be helping we will add Astelin    Myasthenia Gravis: diagnosed at [facility] by Dr. Burnett Harry  09/2015, seronegative, only causing ptosis and double vision, no other symptoms. She had a negative chest CT for thymoma, and has started on medication ( Mestinon ) She is now seeing Dr. Posey Pronto, [facility]. Up to date with follow ups and symptoms have been stable.     Abnormal CT thyroid: incidental finding of thyroid calcification of left lobe, she saw Endo and had negative biopsy.  She had a repeat US done 09/2021 that multinodular goiter she has a follow up coming up this Spring    Bariatric Surgery/Obesity: she had bariatric surgery back in 2014 , her weight was almost 300  lbs, went down to close to 200lbs, but was  gradually gaining weight,she was started on Trulicity 95/1884 and lost 15 lbs , but is gaining it back, she was 241 lbs July 2020 and we started [place], she had lost 11 lbs on the medication ( 229 lbs ), but ran out of medication and gained it back,  we gave a new rx Feb 2021 she titrated dose up, but there a gap with PA and she had to re-start medication April  2021 ,weight went from  233 lbs to 229 8 lbs, but gradually gaining weight again, weight was to 246 lbs, it went up to  261 lbs  we resumed Trulicity Dec 1660 and weight went down to 254 lbs we tried to adjust dose to 1.5 mg however the cost went up and she has been out of medication since June 2023. Today weight is up almost 8 lbs since last visit, but out of trulicity for the past couple of months.   OSA: she is compliant with CPAP, under the care of Dr. Melvyn Novas    Patient Active Problem List   Diagnosis Date Noted   Carpal tunnel syndrome, right upper limb 08/19/2019   Carpal tunnel syndrome, left upper limb 06/08/2019   Multinodular goiter 11/25/2018   Essential hypertension 11/11/2018  DOE (dyspnea on exertion) 04/15/2018   Hyperlipidemia 12/09/2017   Diaphoresis 11/26/2017   Special screening for malignant neoplasms, colon    Epiretinal membrane (ERM) of left eye 09/09/2016   Glaucoma, left eye 09/05/2016   Seronegative myasthenia gravis ([place]) 08/08/2015   Psoriasis 07/06/2015   Stress incontinence 07/06/2015   Chronic tension-type headache, intractable 05/29/2015   Anxiety and depression 04/12/2015   Asthma, mild intermittent 04/12/2015   Bladder polyp 04/12/2015   Carpal tunnel syndrome 04/12/2015   Binocular vision disorder with diplopia 04/12/2015   Dyslipidemia 04/12/2015   H/O: HTN (hypertension) 04/12/2015   Hemorrhoids, internal 04/12/2015   Neuropathy 04/12/2015   Nuclear sclerotic cataract 04/12/2015   Morbid obesity ([place]) 04/12/2015   Obstructive apnea 04/12/2015    Perennial allergic rhinitis 04/12/2015   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/12/2015   Type 2 diabetes, controlled, with neuropathy ([place]) 04/12/2015   Vitamin D deficiency 04/12/2015   Cephalalgia 08/16/2014   Bariatric surgery status 10/12/2012    Past Surgical History:  Procedure Laterality Date   ABDOMINAL HYSTERECTOMY  1991   BARIATRIC SURGERY  2014   CARPAL TUNNEL RELEASE Left 06/08/2019   Procedure: LEFT CARPAL TUNNEL RELEASE;  Surgeon: Mcarthur Rossetti, MD;  Location: [place];  Service: Orthopedics;  Laterality: Left;   CARPAL TUNNEL RELEASE Right 08/19/2019   Procedure: RIGHT CARPAL TUNNEL RELEASE;  Surgeon: Mcarthur Rossetti, MD;  Location: [place];  Service: Orthopedics;  Laterality: Right;   CATARACT EXTRACTION W/PHACO Left 01/28/2017   Procedure: CATARACT EXTRACTION PHACO AND INTRAOCULAR LENS PLACEMENT (IOC);  Surgeon: Birder Robson, MD;  Location: [facility];  Service: Ophthalmology;  Laterality: Left;  [place] 00:37 AP% 17.8 CDE 6.61 Fluid pack lot # [id number]   COLONOSCOPY  2012   COLONOSCOPY WITH PROPOFOL N/A 09/30/2016   Procedure: COLONOSCOPY WITH PROPOFOL;  Surgeon: Lucilla Lame, MD;  Location: [place];  Service: Endoscopy;  Laterality: N/A;  diabetic - diet controlled sleep apnea   KNEE SURGERY Right 1979-1980   OOPHORECTOMY     PARS PLANA VITRECTOMY Left 03/27/2016   Procedure: PARS PLANA VITRECTOMY WITH 25 GAUGE AND MEMBRANE PEEL, INTERNAL LIMITING MEMBRANE;  Surgeon: Hurman Horn, MD;  Location: [place];  Service: Ophthalmology;  Laterality: Left;   THYROID SURGERY     BX done was benign   UPPER GI ENDOSCOPY     VITRECTOMY Left 04/27/2016    Family History  Problem Relation Age of Onset   Diabetes Mother    Hypertension Mother    Heart disease Mother 66       stent x 1    Migraines Sister    Stroke Maternal Grandfather    Diabetes Paternal Grandfather    Muscular dystrophy Sister     Heart attack Maternal Aunt    Breast cancer Neg Hx    Thyroid disease Neg Hx     Social History   Tobacco Use   Smoking status: Former    Packs/day: 0.50    Years: 3.00    Total pack years: 1.50    Types: Cigarettes    Start date: 06/24/1972    Quit date: 06/25/1975    Years since quitting: 47.0   Smokeless tobacco: Never   Tobacco comments:    smoked as teenager  Substance Use Topics   Alcohol use: No    Alcohol/week: 0.0 standard drinks of alcohol     Current Outpatient Medications:    azelastine (ASTELIN) 0.1 % nasal spray,  Place 1 spray into both nostrils 2 (two) times daily. Use in each nostril as directed, Disp: 30 mL, Rfl: 0   cyanocobalamin (VITAMIN B12) 500 MCG tablet, Take 500 mcg by mouth 3 (three) times a week., Disp: , Rfl:    traZODone (DESYREL) 50 MG tablet, Take 0.5-1 tablets (25-50 mg total) by mouth at bedtime., Disp: 90 tablet, Rfl: 0   acetaminophen (TYLENOL) 500 MG tablet, Take 1,000 mg by mouth every 6 (six) hours as needed for mild pain. Reported on 07/06/2015, Disp: , Rfl:    albuterol (VENTOLIN HFA) 108 (90 Base) MCG/ACT inhaler, INHALE 2 PUFFS BY MOUTH EVERY 4 HOURS AS NEEDED FOR WHEEZING OR SHORTNESS OF BREATH., Disp: 18 g, Rfl: 0   Blood Glucose Monitoring Suppl (FREESTYLE FREEDOM) KIT, 100 each by Does not apply route every 30 (thirty) days., Disp: 1 kit, Rfl: 3   budesonide (RHINOCORT AQUA) 32 MCG/ACT nasal spray, Place 2 sprays into both nostrils at bedtime., Disp: 15 mL, Rfl: 2   budesonide-formoterol (SYMBICORT) 80-4.5 MCG/ACT inhaler, Take 2 puffs first thing in am and then another 2 puffs about 12 hours later., Disp: 1 each, Rfl: 12   chlorpheniramine (CHLOR-TRIMETON) 4 MG tablet, Take 4 mg by mouth 2 (two) times daily as needed for allergies., Disp: , Rfl:    cholecalciferol (VITAMIN D3) 25 MCG (1000 UNIT) tablet, Take 1,000 Units by mouth daily., Disp: , Rfl:    Coenzyme Q10 (CO Q 10) 100 MG CAPS, Take 1 capsule by mouth daily., Disp: 30 capsule,  Rfl: 2   Dulaglutide (TRULICITY) 4.12 IN/8.6VE SOPN, Inject 0.75 mg into the skin once a week., Disp: 2 mL, Rfl: 5   DULoxetine (CYMBALTA) 60 MG capsule, TAKE 1 CAPSULE (60 MG TOTAL) BY MOUTH DAILY., Disp: 90 capsule, Rfl: 1   glucose blood (FREESTYLE LITE) test strip, 100 each by Other route daily. Use one strip daily for blood glucose testing, Disp: 100 each, Rfl: 12   Lancets (FREESTYLE) lancets, Use one lancet daily for blood glucose testing, Disp: 100 each, Rfl: 12   montelukast (SINGULAIR) 10 MG tablet, TAKE 1 TABLET BY MOUTH EVERYDAY AT BEDTIME, Disp: 90 tablet, Rfl: 1   Multiple Vitamins-Minerals (MULTIVITAMIN WITH MINERALS) tablet, Take 1 tablet by mouth daily., Disp: , Rfl:    Omega-3 Fatty Acids (FISH OIL) 1200 MG CAPS, Take 2 capsules by mouth daily., Disp: , Rfl:    pyridostigmine (MESTINON) 60 MG tablet, TAKE 1 TABLET AT 8 AM, NOON, AND AT 4 PM. OK TO TAKE EXTRA DOSE AS NEEDED., Disp: 360 tablet, Rfl: 3   rosuvastatin (CRESTOR) 20 MG tablet, TAKE 1 TABLET BY MOUTH EVERY DAY, Disp: 90 tablet, Rfl: 3   telmisartan-hydrochlorothiazide (MICARDIS HCT) 80-25 MG tablet, Take 1 tablet by mouth daily., Disp: 90 tablet, Rfl: 1  Allergies  Allergen Reactions   Aspirin Swelling    Upset stomach  Upset stomach    Azithromycin     Contraindicated in Myasthenia    Ace Inhibitors     cough   Amlodipine     Edema of feet and hands   Gabapentin     anxiety   Pregabalin Other (See Comments)    anxiety    I personally reviewed active problem list, medication list, allergies, family history, social history, health maintenance with the patient/caregiver today.   ROS  Constitutional: Negative for fever or weight change.  Respiratory: Negative for cough and shortness of breath.   Cardiovascular: Negative for chest pain or palpitations.  Gastrointestinal: Negative  for abdominal pain, no bowel changes.  Musculoskeletal: Negative for gait problem or joint swelling.  Skin: Negative for rash.   Neurological: Negative for dizziness or headache.  No other specific complaints in a complete review of systems (except as listed in HPI above).   Objective  Vitals:   07/12/22 1400  BP: 132/76  Pulse: 85  Resp: 16  SpO2: 98%  Weight: 260 lb (117.9 kg)  Height: '5\' 2"'$  (1.575 m)    Body mass index is 47.55 kg/m.  Physical Exam  Constitutional: Patient appears well-developed and well-nourished. Obese  No distress.  HEENT: head atraumatic, normocephalic, pupils equal and reactive to light, neck supple Cardiovascular: Normal rate, regular rhythm and normal heart sounds.  No murmur heard. No BLE edema. Pulmonary/Chest: Effort normal and breath sounds normal. No respiratory distress. Abdominal: Soft.  There is no tenderness. Psychiatric: Patient has a normal mood and affect. behavior is normal. Judgment and thought content normal.   Recent Results (from the past 2160 hour(s))  POCT HgB A1C     Status: Abnormal   Collection Time: 07/12/22  2:08 PM  Result Value Ref Range   Hemoglobin A1C 6.4 (A) 4.0 - 5.6 %   HbA1c POC (<> result, manual entry)     HbA1c, POC (prediabetic range)     HbA1c, POC (controlled diabetic range)      Diabetic Foot Exam: Diabetic Foot Exam - Simple   Simple Foot Form Visual Inspection No deformities, no ulcerations, no other skin breakdown bilaterally: Yes Sensation Testing Intact to touch and monofilament testing bilaterally: Yes Pulse Check Posterior Tibialis and Dorsalis pulse intact bilaterally: Yes Comments      PHQ2/9:    07/12/2022    2:02 PM 03/13/2022    1:50 PM 09/28/2021   10:30 AM 08/21/2021    9:00 AM 05/30/2021    9:57 AM  Depression screen PHQ 2/9  Decreased Interest 0 1 0 0 1  Down, Depressed, Hopeless 0 1 0 0 0  PHQ - 2 Score 0 2 0 0 1  Altered sleeping 2 1 0  3  Tired, decreased energy 0 1 0  0  Change in appetite 0 0 0  0  Feeling bad or failure about yourself  0 0 0  0  Trouble concentrating 0 0 0  0  Moving slowly  or fidgety/restless 0 0 0  0  Suicidal thoughts 0 0 0  0  PHQ-9 Score 2 4 0  4  Difficult doing work/chores  Somewhat difficult       phq 9 is positive   Fall Risk:    07/12/2022    2:02 PM 03/13/2022    1:50 PM 03/08/2022    2:24 PM 09/28/2021   10:30 AM 08/21/2021    9:03 AM  Fall Risk   Falls in the past year? 0 0 0 0 0  Number falls in past yr: 0 0 0 0 0  Injury with Fall? 0 0 0 0 0  Risk for fall due to : No Fall Risks No Fall Risks  No Fall Risks No Fall Risks  Follow up Falls prevention discussed Falls prevention discussed;Education provided  Falls prevention discussed Falls prevention discussed     Assessment & Plan  1. Type 2 diabetes, controlled, with neuropathy (HCC)  - HM Diabetes Foot Exam - POCT HgB A1C - DULoxetine (CYMBALTA) 60 MG capsule; TAKE 1 CAPSULE (60 MG TOTAL) BY MOUTH DAILY.  Dispense: 90 capsule; Refill: 1 - COMPLETE METABOLIC  PANEL WITH GFR  2. Need for pneumococcal 20-valent conjugate vaccination  - Pneumococcal conjugate vaccine 20-valent (Prevnar 20)  3. Peripheral polyneuropathy  - DULoxetine (CYMBALTA) 60 MG capsule; TAKE 1 CAPSULE (60 MG TOTAL) BY MOUTH DAILY.  Dispense: 90 capsule; Refill: 1  4. Perennial allergic rhinitis  - montelukast (SINGULAIR) 10 MG tablet; TAKE 1 TABLET BY MOUTH EVERYDAY AT BEDTIME  Dispense: 90 tablet; Refill: 1 - azelastine (ASTELIN) 0.1 % nasal spray; Place 1 spray into both nostrils 2 (two) times daily. Use in each nostril as directed  Dispense: 30 mL; Refill: 0  5. Hypertension, benign  - telmisartan-hydrochlorothiazide (MICARDIS HCT) 80-25 MG tablet; Take 1 tablet by mouth daily.  Dispense: 90 tablet; Refill: 1  6. Diabetes mellitus with coincident hypertension (HCC)  - telmisartan-hydrochlorothiazide (MICARDIS HCT) 80-25 MG tablet; Take 1 tablet by mouth daily.  Dispense: 90 tablet; Refill: 1 - Lipid panel  7. Adjustment insomnia  - traZODone (DESYREL) 50 MG tablet; Take 0.5-1 tablets (25-50 mg total)  by mouth at bedtime.  Dispense: 90 tablet; Refill: 0  8. Morbidly obese ([place])  Discussed with the patient the risk posed by an increased BMI. Discussed importance of portion control, calorie counting and at least 150 minutes of physical activity weekly. Avoid sweet beverages and drink more water. Eat at least 6 servings of fruit and vegetables daily    9. Seronegative myasthenia gravis ([place])  Keep follow up with neurologist   10. Vitamin D deficiency  - VITAMIN D 25 Hydroxy (Vit-D Deficiency, Fractures)  11. B12 deficiency  - CBC with Differential/Platelet - Vitamin B12  12. Multiple thyroid nodules   13. Dyslipidemia associated with type 2 diabetes mellitus (HCC)  - Lipid panel

## 2022-07-12 ENCOUNTER — Encounter: Payer: Self-pay | Admitting: Family Medicine

## 2022-07-12 ENCOUNTER — Ambulatory Visit (INDEPENDENT_AMBULATORY_CARE_PROVIDER_SITE_OTHER): Payer: Medicare Other | Admitting: Family Medicine

## 2022-07-12 VITALS — BP 132/76 | HR 85 | Resp 16 | Ht 62.0 in | Wt 260.0 lb

## 2022-07-12 DIAGNOSIS — E538 Deficiency of other specified B group vitamins: Secondary | ICD-10-CM

## 2022-07-12 DIAGNOSIS — F5102 Adjustment insomnia: Secondary | ICD-10-CM

## 2022-07-12 DIAGNOSIS — E1140 Type 2 diabetes mellitus with diabetic neuropathy, unspecified: Secondary | ICD-10-CM | POA: Diagnosis not present

## 2022-07-12 DIAGNOSIS — G629 Polyneuropathy, unspecified: Secondary | ICD-10-CM

## 2022-07-12 DIAGNOSIS — E1169 Type 2 diabetes mellitus with other specified complication: Secondary | ICD-10-CM

## 2022-07-12 DIAGNOSIS — E119 Type 2 diabetes mellitus without complications: Secondary | ICD-10-CM

## 2022-07-12 DIAGNOSIS — Z23 Encounter for immunization: Secondary | ICD-10-CM | POA: Diagnosis not present

## 2022-07-12 DIAGNOSIS — J3089 Other allergic rhinitis: Secondary | ICD-10-CM | POA: Diagnosis not present

## 2022-07-12 DIAGNOSIS — I10 Essential (primary) hypertension: Secondary | ICD-10-CM | POA: Diagnosis not present

## 2022-07-12 DIAGNOSIS — E042 Nontoxic multinodular goiter: Secondary | ICD-10-CM

## 2022-07-12 DIAGNOSIS — G7000 Myasthenia gravis without (acute) exacerbation: Secondary | ICD-10-CM

## 2022-07-12 DIAGNOSIS — E785 Hyperlipidemia, unspecified: Secondary | ICD-10-CM

## 2022-07-12 DIAGNOSIS — E559 Vitamin D deficiency, unspecified: Secondary | ICD-10-CM

## 2022-07-12 LAB — POCT GLYCOSYLATED HEMOGLOBIN (HGB A1C): Hemoglobin A1C: 6.4 % — AB (ref 4.0–5.6)

## 2022-07-12 MED ORDER — TRAZODONE HCL 50 MG PO TABS: 25.0000 mg | ORAL_TABLET | Freq: Every day | ORAL | 0 refills | Status: DC

## 2022-07-12 MED ORDER — TELMISARTAN-HCTZ 80-25 MG PO TABS: 1.0000 | ORAL_TABLET | Freq: Every day | ORAL | 1 refills | Status: DC

## 2022-07-12 MED ORDER — AZELASTINE HCL 0.1 % NA SOLN: 1.0000 | Freq: Two times a day (BID) | NASAL | 0 refills | Status: DC

## 2022-07-12 MED ORDER — DULOXETINE HCL 60 MG PO CPEP: ORAL_CAPSULE | Freq: Every day | ORAL | 1 refills | Status: DC

## 2022-07-12 MED ORDER — MONTELUKAST SODIUM 10 MG PO TABS: ORAL_TABLET | ORAL | 1 refills | Status: DC

## 2022-08-04 ENCOUNTER — Other Ambulatory Visit: Payer: Self-pay | Admitting: Family Medicine

## 2022-08-04 DIAGNOSIS — J3089 Other allergic rhinitis: Secondary | ICD-10-CM

## 2022-08-05 ENCOUNTER — Other Ambulatory Visit: Payer: Self-pay

## 2022-08-14 ENCOUNTER — Telehealth: Payer: Self-pay | Admitting: Family Medicine

## 2022-08-14 NOTE — Telephone Encounter (Signed)
Copied from [place] [id number]. Topic: General - Other >> Aug 14, 2022 10:57 AM Chapman Fitch wrote: Reason for CRM: Pt received a call asking her about TIA, neurological issues and muscles issues / they were called [facility] out of [place]/ they advised pt that they would send answers to their questions to [name] / please advise if this was order or started by [name]

## 2022-08-22 ENCOUNTER — Ambulatory Visit (INDEPENDENT_AMBULATORY_CARE_PROVIDER_SITE_OTHER): Payer: Medicare Other

## 2022-08-22 VITALS — Ht 62.0 in | Wt 260.0 lb

## 2022-08-22 DIAGNOSIS — Z1231 Encounter for screening mammogram for malignant neoplasm of breast: Secondary | ICD-10-CM

## 2022-08-22 DIAGNOSIS — Z0000 Encounter for general adult medical examination without abnormal findings: Secondary | ICD-10-CM

## 2022-08-22 NOTE — Progress Notes (Signed)
I connected with  Jasmine Woods on 08/22/22 by a audio/telephone and verified that I am speaking with the correct person using two identifiers.  Patient Location: Home  Provider Location: Office/Clinic  I discussed the limitations of evaluation and management by telemedicine. The patient expressed understanding and agreed to proceed.  Subjective:   Jasmine Woods is a 67 y.o. female who presents for Medicare Annual (Subsequent) preventive examination.  Review of Systems   Cardiac Risk Factors include: advanced age (>19mn, >>85women);diabetes mellitus;dyslipidemia;hypertension;sedentary lifestyle;obesity (BMI >30kg/m2)     Objective:    Today's Vitals   08/22/22 0835  Weight: 260 lb (117.9 kg)  Height: '5\' 2"'$  (1.575 m)   Body mass index is 47.55 kg/m.     08/22/2022    8:49 AM 03/08/2022    2:24 PM 08/21/2021    9:01 AM 09/15/2020   10:41 AM 08/19/2019    8:47 AM 08/12/2019   12:54 PM 06/08/2019    6:55 AM  Advanced Directives  Does Patient Have a Medical Advance Directive? Yes Yes Yes Yes Yes Yes Yes  Type of AScientist, physiologicalof [place] Rancho [place]ela[place] will   Healthcare Power of [place] Does patient want to make changes to medical advance directive? Yes (ED - Information included in AVS)    No - Patient declined    Copy of [place] Chart?   No - copy requested  No - copy requested  No - copy requested  Would patient like information on creating a medical advance directive?     No - Patient declined      Current Medications (verified) Outpatient Encounter Medications as of 08/22/2022  Medication Sig   acetaminophen (TYLENOL) 500 MG tablet Take 1,000 mg by mouth every 6 (six) hours as needed for mild pain. Reported on 07/06/2015   Azelastine HCl 137 MCG/SPRAY SOLN PLACE 1 SPRAY INTO BOTH NOSTRILS 2 (TWO) TIMES DAILY. USE IN EACH NOSTRIL AS DIRECTED   budesonide (RHINOCORT AQUA) 32 MCG/ACT  nasal spray Place 2 sprays into both nostrils at bedtime.   budesonide-formoterol (SYMBICORT) 80-4.5 MCG/ACT inhaler Take 2 puffs first thing in am and then another 2 puffs about 12 hours later.   chlorpheniramine (CHLOR-TRIMETON) 4 MG tablet Take 4 mg by mouth 2 (two) times daily as needed for allergies.   cholecalciferol (VITAMIN D3) 25 MCG (1000 UNIT) tablet Take 1,000 Units by mouth daily.   Coenzyme Q10 (CO Q 10) 100 MG CAPS Take 1 capsule by mouth daily.   cyanocobalamin (VITAMIN B12) 500 MCG tablet Take 500 mcg by mouth 3 (three) times a week.   Dulaglutide (TRULICITY) [id number] Inject 0.75 mg into the skin once a week.   DULoxetine (CYMBALTA) 60 MG capsule TAKE 1 CAPSULE (60 MG TOTAL) BY MOUTH DAILY.   glucose blood (FREESTYLE LITE) test strip 100 each by Other route daily. Use one strip daily for blood glucose testing   Lancets (FREESTYLE) lancets Use one lancet daily for blood glucose testing   montelukast (SINGULAIR) 10 MG tablet TAKE 1 TABLET BY MOUTH EVERYDAY AT BEDTIME   Multiple Vitamins-Minerals (MULTIVITAMIN WITH MINERALS) tablet Take 1 tablet by mouth daily.   Omega-3 Fatty Acids (FISH OIL) 1200 MG CAPS Take 2 capsules by mouth daily.   pyridostigmine (MESTINON) 60 MG tablet TAKE 1 TABLET AT 8 AM, NOON, AND AT 4 PM. OK TO TAKE EXTRA DOSE AS NEEDED.   rosuvastatin (CRESTOR) 20 MG tablet TAKE  1 TABLET BY MOUTH EVERY DAY   telmisartan-hydrochlorothiazide (MICARDIS HCT) 80-25 MG tablet Take 1 tablet by mouth daily.   traZODone (DESYREL) 50 MG tablet Take 0.5-1 tablets (25-50 mg total) by mouth at bedtime.   albuterol (VENTOLIN HFA) 108 (90 Base) MCG/ACT inhaler INHALE 2 PUFFS BY MOUTH EVERY 4 HOURS AS NEEDED FOR WHEEZING OR SHORTNESS OF BREATH.   No facility-administered encounter medications on file as of 08/22/2022.    Allergies (verified) Aspirin, Azithromycin, Ace inhibitors, Amlodipine, Gabapentin, and Pregabalin   History: Past Medical History:  Diagnosis Date    Allergy    Anxiety    Asthma    Bladder polyp    Dr. Ernst Spell   Carpal tunnel syndrome    Complication of anesthesia    needed nebulizer after surgery   Depression    Diabetes mellitus without complication ([place])    Diet controlled   Double vision    seen by Dr. 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 myastenia gravis   Dyslipidemia    Frequency of urination    Glaucoma, left eye    H/O myasthenia gravis    MOSTLY AFFECTS EYES   HA (headache)    Heart murmur    followed by PCP   Hx of bariatric surgery    Hypertension    no meds   Internal hemorrhoids    Myasthenia gravis ([place])    "mostly effects eyes"   Myasthenia gravis ([place])    Neuropathy, generalized    diabetic on feet   Nuclear sclerotic cataract of both eyes    Obesity (BMI 30-39.9)    Obstructive sleep apnea on CPAP    uses cpap   Patient is Jehovah's Witness    no blood products   Rotator cuff tendonitis 07/2009   also has impingment right shoulder pain, seen by Dr. Mack Guise   Sleep apnea    Thyroid nodule    Vaginal dryness, menopausal    Vitamin D deficiency    Past Surgical History:  Procedure Laterality Date   ABDOMINAL HYSTERECTOMY  1991   BARIATRIC SURGERY  2014   CARPAL TUNNEL RELEASE Left 06/08/2019   Procedure: LEFT CARPAL TUNNEL RELEASE;  Surgeon: Mcarthur Rossetti, MD;  Location: [place];  Service: Orthopedics;  Laterality: Left;   CARPAL TUNNEL RELEASE Right 08/19/2019   Procedure: RIGHT CARPAL TUNNEL RELEASE;  Surgeon: Mcarthur Rossetti, MD;  Location: [place];  Service: Orthopedics;  Laterality: Right;   CATARACT EXTRACTION W/PHACO Left 01/28/2017   Procedure: CATARACT EXTRACTION PHACO AND INTRAOCULAR LENS PLACEMENT (IOC);  Surgeon: Birder Robson, MD;  Location: [facility];  Service: Ophthalmology;  Laterality: Left;  [place] 00:37 AP% 17.8 CDE 6.61 Fluid pack lot # [id number]   COLONOSCOPY  2012   COLONOSCOPY WITH PROPOFOL N/A 09/30/2016   Procedure: COLONOSCOPY  WITH PROPOFOL;  Surgeon: Lucilla Lame, MD;  Location: [place];  Service: Endoscopy;  Laterality: N/A;  diabetic - diet controlled sleep apnea   KNEE SURGERY Right 1979-1980   OOPHORECTOMY     PARS PLANA VITRECTOMY Left 03/27/2016   Procedure: PARS PLANA VITRECTOMY WITH 25 GAUGE AND MEMBRANE PEEL, INTERNAL LIMITING MEMBRANE;  Surgeon: Hurman Horn, MD;  Location: [place];  Service: Ophthalmology;  Laterality: Left;   THYROID SURGERY     BX done was benign   UPPER GI ENDOSCOPY     VITRECTOMY Left 04/27/2016   Family History  Problem Relation Age of Onset   Diabetes Mother    Hypertension Mother  Heart disease Mother 42       stent x 1    Migraines Sister    Stroke Maternal Grandfather    Diabetes Paternal Grandfather    Muscular dystrophy Sister    Heart attack Maternal Aunt    Breast cancer Neg Hx    Thyroid disease Neg Hx    Social History   Socioeconomic History   Marital status: Widowed    Spouse name: Gwyndolyn Saxon "Butch"   Number of children: 2   Years of education: 12   Highest education level: Associate degree: occupational, Hotel manager, or vocational program  Occupational History   Occupation: [employer]  Tobacco Use   Smoking status: Former    Packs/day: 0.50    Years: 3.00    Total pack years: 1.50    Types: Cigarettes    Start date: 06/24/1972    Quit date: 06/25/1975    Years since quitting: 47.1   Smokeless tobacco: Never   Tobacco comments:    smoked as teenager  Vaping Use   Vaping Use: Never used  Substance and Sexual Activity   Alcohol use: No    Alcohol/week: 0.0 standard drinks of alcohol   Drug use: No   Sexual activity: Not Currently    Partners: Male  Other Topics Concern   Not on file  Social History Narrative   Right handed   One story home   12 years education   Retired from [employer] with husband   Social Determinants of Health   Financial Resource Strain: [place]  (08/22/2022)   Overall Financial Resource Strain  (CARDIA)    Difficulty of Paying Living Expenses: Not hard at all  Food Insecurity: No Food Insecurity (08/22/2022)   Hunger Vital Sign    Worried About Running Out of Food in the Last Year: Never true    [place] in the Last Year: Never true  Transportation Needs: No Transportation Needs (08/22/2022)   PRAPARE - Hydrologist (Medical): No    Lack of Transportation (Non-Medical): No  Physical Activity: Inactive (08/22/2022)   Exercise Vital Sign    Days of Exercise per Week: 0 days    Minutes of Exercise per Session: 0 min  Stress: No Stress Concern Present (08/22/2022)   [place]    Feeling of Stress : Not at all  Social Connections: Moderately Integrated (08/22/2022)   Social Connection and Isolation Panel [NHANES]    Frequency of Communication with Friends and Family: More than three times a week    Frequency of Social Gatherings with Friends and Family: More than three times a week    Attends Religious Services: More than 4 times per year    Active Member of [employer] or Organizations: Yes    Attends Archivist Meetings: More than 4 times per year    Marital Status: Widowed    Tobacco Counseling Counseling given: Not Answered Tobacco comments: smoked as teenager   Clinical Intake:  Pre-visit preparation completed: Yes  Pain : No/denies pain     BMI - recorded: 47.55 Nutritional Status: BMI > 30  Obese Nutritional Risks: None Diabetes: Yes CBG done?: No (BS 105 this am at home) Did pt. bring in CBG monitor from home?: No  How often do you need to have someone help you when you read instructions, pamphlets, or other written materials from your doctor or pharmacy?: 1 - Never  Diabetic?yes  Interpreter Needed?: No  Information entered by :: B.Teiana Hajduk,LPN   Activities of Daily Living    08/22/2022    8:49 AM 07/12/2022    2:03 PM  In your present state of  health, do you have any difficulty performing the following activities:  Hearing? 0 0  Vision? 0 0  Difficulty concentrating or making decisions? 0 0  Walking or climbing stairs? 0 0  Dressing or bathing? 0 0  Doing errands, shopping? 0 0  Preparing Food and eating ? N   Using the Toilet? N   In the past six months, have you accidently leaked urine? N   Do you have problems with loss of bowel control? N   Managing your Medications? N   Managing your Finances? N   Housekeeping or managing your Housekeeping? N     Patient Care Team: Steele Sizer, MD as PCP - General (Family Medicine) Steele Sizer, MD as Attending Physician (Family Medicine) Alda Berthold, DO as Consulting Physician (Neurology) Renato Shin, MD (Inactive) as Consulting Physician (Endocrinology) End, Harrell Gave, MD as Consulting Physician (Cardiology) Lucilla Lame, MD as Consulting Physician (Gastroenterology) Tanda Rockers, MD as Consulting Physician (Pulmonary Disease) Allyne Gee, MD as Consulting Physician (Internal Medicine)  Indicate any recent Medical Services you may have received from other than [facility] providers in the past year (date may be approximate).     Assessment:   This is a routine wellness examination for Carolynn.  Hearing/Vision screen Hearing Screening - Comments:: Adequate hearing Vision Screening - Comments:: Adequate vision w/glasses [facility]  Dietary issues and exercise activities discussed: Current Exercise Habits: The patient does not participate in regular exercise at present, Exercise limited by: psychological condition(s) (mourning loss of husband:grief)   Goals Addressed   None    Depression Screen    08/22/2022    8:43 AM 07/12/2022    2:02 PM 03/13/2022    1:50 PM 09/28/2021   10:30 AM 08/21/2021    9:00 AM 05/30/2021    9:57 AM 01/29/2021   10:36 AM  PHQ 2/9 Scores  PHQ - 2 Score 0 0 2 0 0 1 0  PHQ- 9 Score '2 2 4 '$ 0  4     Fall Risk    08/22/2022     8:39 AM 07/12/2022    2:02 PM 03/13/2022    1:50 PM 03/08/2022    2:24 PM 09/28/2021   10:30 AM  Fall Risk   Falls in the past year? 0 0 0 0 0  Number falls in past yr: 0 0 0 0 0  Injury with Fall? 0 0 0 0 0  Risk for fall due to : No Fall Risks No Fall Risks No Fall Risks  No Fall Risks  Follow up Education provided;Falls prevention discussed Falls prevention discussed Falls prevention discussed;Education provided  Falls prevention discussed    FALL RISK PREVENTION PERTAINING TO THE HOME:  Any stairs in or around the home? No  If so, are there any without handrails? No  Home free of loose throw rugs in walkways, pet beds, electrical cords, etc? Yes  Adequate lighting in your home to reduce risk of falls? Yes   ASSISTIVE DEVICES UTILIZED TO PREVENT FALLS:  Life alert? No  Use of a cane, walker or w/c? No  Grab bars in the bathroom? Yes  Shower chair or bench in shower? Yes  Elevated toilet seat or a handicapped toilet? Yes    Cognitive Function:  08/22/2022    8:51 AM  6CIT Screen  What Year? 0 points  What month? 0 points  What time? 0 points  Count back from 20 0 points  Months in reverse 0 points  Repeat phrase 0 points  Total Score 0 points    Immunizations Immunization History  Administered Date(s) Administered   Fluad Quad(high Dose 65+) 03/15/2022   Influenza, Quadrivalent, Recombinant, Inj, Pf 03/24/2019   Influenza,inj,Quad PF,6+ Mos 03/22/2020   Influenza-Unspecified 04/06/2015, 04/04/2016, 04/03/2017, 03/18/2018, 03/30/2021   PFIZER Comirnaty(Gray Top)Covid-19 Tri-Sucrose Vaccine 10/17/2020, 03/15/2022   PFIZER(Purple Top)SARS-COV-2 Vaccination 07/09/2019, 07/30/2019, 04/07/2020, 10/17/2020, 03/30/2021   PNEUMOCOCCAL CONJUGATE-20 07/12/2022   Pneumococcal Conjugate-13 07/06/2015, 07/31/2020   Pneumococcal Polysaccharide-23 12/19/2011, 03/18/2017   Tdap 10/24/2005, 12/15/2015   Zoster Recombinat (Shingrix) 11/03/2019, 03/29/2020   Zoster, Live  03/25/2011    TDAP status: Up to date  Flu Vaccine status: Up to date  Pneumococcal vaccine status: Up to date  Covid-19 vaccine status: Completed vaccines  Qualifies for Shingles Vaccine? Yes   Zostavax completed Yes   Shingrix Completed?: Yes  Screening Tests Health Maintenance  Topic Date Due   COVID-19 Vaccine (8 - 2023-24 season) 05/10/2022   Diabetic kidney evaluation - eGFR measurement  09/29/2022   Diabetic kidney evaluation - Urine ACR  09/29/2022   HEMOGLOBIN A1C  01/10/2023   OPHTHALMOLOGY EXAM  03/30/2023   FOOT EXAM  07/13/2023   Medicare Annual Wellness (AWV)  08/22/2023   MAMMOGRAM  10/05/2023   DTaP/Tdap/Td (3 - Td or Tdap) 12/14/2025   COLONOSCOPY (Pts 45-20yr Insurance coverage will need to be confirmed)  10/01/2026   Pneumonia Vaccine 67 Years old  Completed   INFLUENZA VACCINE  Completed   DEXA SCAN  Completed   Hepatitis C Screening  Completed   Zoster Vaccines- Shingrix  Completed   HPV VACCINES  Aged Out    Health Maintenance  Health Maintenance Due  Topic Date Due   COVID-19 Vaccine (8 - 2023-24 season) 05/10/2022    Colorectal cancer screening: Type of screening: Colonoscopy. Completed yes. Repeat every 10 years  Mammogram status: Ordered yes. Pt provided with contact info and advised to call to schedule appt.   Bone Density status: Completed yes. Results reflect: Bone density results: NORMAL. Repeat every 5 years.  Lung Cancer Screening: (Low Dose CT Chest recommended if Age 68-80years, 30 pack-year currently smoking OR have quit w/in 15years.) does not qualify.   Lung Cancer Screening Referral: no  Additional Screening:  Hepatitis C Screening: does not qualify; Completed no  Vision Screening: Recommended annual ophthalmology exams for early detection of glaucoma and other disorders of the eye. Is the patient up to date with their annual eye exam?  Yes  Who is the provider or what is the name of the office in which the patient  attends annual eye exams? [place] pt is not established with a provider, would they like to be referred to a provider to establish care? No .   Dental Screening: Recommended annual dental exams for proper oral hygiene  Community Resource Referral / Chronic Care Management: CRR required this visit?  No   CCM required this visit?  No      Plan:     I have personally reviewed and noted the following in the patient's chart:   Medical and social history Use of alcohol, tobacco or illicit drugs  Current medications and supplements including opioid prescriptions. Patient is not currently taking opioid prescriptions. Functional ability and status Nutritional  status Physical activity Advanced directives List of other physicians Hospitalizations, surgeries, and ER visits in previous 12 months Vitals Screenings to include cognitive, depression, and falls Referrals and appointments  In addition, I have reviewed and discussed with patient certain preventive protocols, quality metrics, and best practice recommendations. A written personalized care plan for preventive services as well as general preventive health recommendations were provided to patient.     Roger Shelter, LPN   [id number]   Nurse Notes: pt is physically doing well. She relays she is still having moments of grieving with the death of her husband. Pt is on medication to help her during this process. Pt has no concerns or questions during the visit.  MMG referral/order placed (due after 10/05/2022).

## 2022-09-13 ENCOUNTER — Encounter: Payer: Self-pay | Admitting: Family Medicine

## 2022-09-13 ENCOUNTER — Other Ambulatory Visit: Payer: Self-pay | Admitting: Family Medicine

## 2022-09-13 DIAGNOSIS — J4530 Mild persistent asthma, uncomplicated: Secondary | ICD-10-CM

## 2022-09-13 MED ORDER — ALBUTEROL SULFATE HFA 108 (90 BASE) MCG/ACT IN AERS: INHALATION_SPRAY | RESPIRATORY_TRACT | 0 refills | Status: DC

## 2022-10-03 ENCOUNTER — Other Ambulatory Visit: Payer: Self-pay | Admitting: Family Medicine

## 2022-10-03 DIAGNOSIS — F5102 Adjustment insomnia: Secondary | ICD-10-CM

## 2022-11-02 ENCOUNTER — Other Ambulatory Visit: Payer: Self-pay | Admitting: Family Medicine

## 2022-11-02 DIAGNOSIS — F5102 Adjustment insomnia: Secondary | ICD-10-CM

## 2022-11-12 ENCOUNTER — Ambulatory Visit: Payer: Medicare Other | Admitting: Family Medicine

## 2022-11-16 LAB — CBC WITH DIFFERENTIAL/PLATELET
Absolute Monocytes: 746 cells/uL (ref 200–950)
Basophils Absolute: 53 cells/uL (ref 0–200)
Basophils Relative: 0.5 %
Eosinophils Absolute: 147 cells/uL (ref 15–500)
Eosinophils Relative: 1.4 %
HCT: 37.8 % (ref 35.0–45.0)
Hemoglobin: 12.7 g/dL (ref 11.7–15.5)
Lymphs Abs: 3612 cells/uL (ref 850–3900)
MCH: 28.4 pg (ref 27.0–33.0)
MCHC: 33.6 g/dL (ref 32.0–36.0)
MCV: 84.6 fL (ref 80.0–100.0)
MPV: 9.8 fL (ref 7.5–12.5)
Monocytes Relative: 7.1 %
Neutro Abs: 5943 cells/uL (ref 1500–7800)
Neutrophils Relative %: 56.6 %
Platelets: 346 10*3/uL (ref 140–400)
RBC: 4.47 10*6/uL (ref 3.80–5.10)
RDW: 13.8 % (ref 11.0–15.0)
Total Lymphocyte: 34.4 %
WBC: 10.5 10*3/uL (ref 3.8–10.8)

## 2022-11-16 LAB — LIPID PANEL
Cholesterol: 204 mg/dL — ABNORMAL HIGH (ref <200)
HDL: 58 mg/dL (ref >=50)
LDL Cholesterol (Calc): 125 mg/dL (calc) — ABNORMAL HIGH
Non-HDL Cholesterol (Calc): 146 mg/dL (calc) — ABNORMAL HIGH (ref <130)
Total CHOL/HDL Ratio: 3.5 (calc) (ref <5.0)
Triglycerides: 104 mg/dL (ref <150)

## 2022-11-16 LAB — COMPLETE METABOLIC PANEL WITH GFR
AG Ratio: 1.5 (calc) (ref 1.0–2.5)
ALT: 17 U/L (ref 6–29)
AST: 14 U/L (ref 10–35)
Albumin: 4.1 g/dL (ref 3.6–5.1)
Alkaline phosphatase (APISO): 49 U/L (ref 37–153)
BUN: 17 mg/dL (ref 7–25)
CO2: 26 mmol/L (ref 20–32)
Calcium: 9.2 mg/dL (ref 8.6–10.4)
Chloride: 102 mmol/L (ref 98–110)
Creat: 0.56 mg/dL (ref 0.50–1.05)
Globulin: 2.7 g/dL (calc) (ref 1.9–3.7)
Glucose, Bld: 87 mg/dL (ref 65–99)
Potassium: 4.3 mmol/L (ref 3.5–5.3)
Sodium: 139 mmol/L (ref 135–146)
Total Bilirubin: 0.6 mg/dL (ref 0.2–1.2)
Total Protein: 6.8 g/dL (ref 6.1–8.1)
eGFR: 100 mL/min/{1.73_m2} (ref >=60)

## 2022-11-16 LAB — VITAMIN B12: Vitamin B-12: 1601 pg/mL — ABNORMAL HIGH (ref 200–1100)

## 2022-11-16 LAB — VITAMIN D 25 HYDROXY (VIT D DEFICIENCY, FRACTURES): Vit D, 25-Hydroxy: 25 ng/mL — ABNORMAL LOW (ref 30–100)

## 2022-12-06 ENCOUNTER — Other Ambulatory Visit: Payer: Self-pay | Admitting: Family Medicine

## 2022-12-16 ENCOUNTER — Encounter: Payer: Self-pay | Admitting: Neurology

## 2022-12-28 ENCOUNTER — Other Ambulatory Visit: Payer: Self-pay | Admitting: Family Medicine

## 2023-01-01 ENCOUNTER — Other Ambulatory Visit: Payer: Self-pay | Admitting: Neurology

## 2023-01-01 DIAGNOSIS — G7000 Myasthenia gravis without (acute) exacerbation: Secondary | ICD-10-CM

## 2023-01-03 ENCOUNTER — Other Ambulatory Visit: Payer: Self-pay

## 2023-01-03 DIAGNOSIS — G7000 Myasthenia gravis without (acute) exacerbation: Secondary | ICD-10-CM

## 2023-01-03 MED ORDER — PYRIDOSTIGMINE BROMIDE 60 MG PO TABS
ORAL_TABLET | ORAL | 2 refills | Status: DC
Start: 2023-01-03 — End: 2023-04-15

## 2023-01-07 NOTE — Progress Notes (Unsigned)
Name: Jasmine Woods   MRN: [id number]    DOB: 17-Aug-1955   Date:01/08/2023       Progress Note  Subjective  Chief Complaint  Follow Up  HPI  DMII: A1C was 6.2 % and today is  6%  she is back on Trulicity 0.75 mg but due to manufacture problems we will change to Mounjarno 5 mg dose  . Neuropathy is still present , constant, burning- like, she takes Duloxetine  She could not tolerate Gabapentin or Lyrica and also did not work. She also has associated HTN, obesity and dyslipidemia. She is on ARB and Rosuvastatin.   HTN: she is on Micardis 80/25 mg, bp has been at goal, no chest pain, palpitation or SOB    Hyperlipidemia:she is currently taking Crestor 20 mg, LDL was 86 in 2023 and had gone up to 125 in May but at the time she is not compliant with medications. She has been taking daily since  Asthma Mild Intermittent : she used to see Dr. Rolin Barry, she was  taking Symbicort  prn  ( but too costly ) so she is only  taking singulair. She is doing well, no cough, wheezing or sob. Discuss cost with Dr. Rolin Barry   AR: she is taking loratadine, nasal spray and singulair and seems to be helping with symptoms  History of bladder polyps and cystoscopy many years ago, she states over the past month she has noticed urinary frequency, mild dysuria but no hematuria, no fever, chills or back pain. She has nocturia but chronic . She saw Dr. Orson Slick many years ago and if urine culture is negative we will refer to [facility] - she would like to see [facility]    Myasthenia Gravis: diagnosed at [facility] by Dr. Georgina Pillion  09/2015, seronegative, only causing ptosis and double vision, no other symptoms. She had a negative chest CT for thymoma, and has started on medication ( Mestinon ) She is now seeing Dr. Allena Katz, [facility].  Stable     Abnormal CT thyroid/multinodular goiter  incidental finding of thyroid calcification of left lobe, she saw Endo and had negative biopsy.  She had a repeat US done 09/2021 ,  Dr. Everardo All retired and she needs to follow up with new Endo    Bariatric Surgery/Obesity: she had bariatric surgery back in 2014 , her weight was almost 300 lbs, went down to close to 200lbs, but was  gradually gaining weight,she was started on Trulicity 02/2017 and lost 15 lbs , but is gaining it back, she was 241 lbs July 2020 and we started [place], she had lost 11 lbs on the medication ( 229 lbs ), but ran out of medication and gained it back,  we gave a new rx Feb 2021 she titrated dose up, but there a gap with PA and she had to re-start medication April  2021 ,weight went from  233 lbs to 229 8 lbs, but gradually gaining weight again, weight was to 246 lbs, it went up to  261 lbs  we resumed Trulicity Dec 2022 and weight went down to 254 lbs we tried to adjust dose to 1.5 mg however the cost went up and she has been out of medication since June 2023. She has changed her diet since January, having more vegetables and lost 10 lbs in the past 6 months, she has also been more active lately   OSA: she is compliant with CPAP, under the care of Dr. Sherene Sires    Vitamin D deficiency:  she is taking vitamin D again since last level was low   B12 deficiency: advised to resume B12 SL but only a few times week, 1000 mcg   Patient Active Problem List   Diagnosis Date Noted   Carpal tunnel syndrome, right upper limb 08/19/2019   Carpal tunnel syndrome, left upper limb 06/08/2019   Multinodular goiter 11/25/2018   Essential hypertension 11/11/2018   DOE (dyspnea on exertion) 04/15/2018   Hyperlipidemia 12/09/2017   Diaphoresis 11/26/2017   Special screening for malignant neoplasms, colon    Epiretinal membrane (ERM) of left eye 09/09/2016   Glaucoma, left eye 09/05/2016   Seronegative myasthenia gravis (HCC) 08/08/2015   Psoriasis 07/06/2015   Stress incontinence 07/06/2015   Chronic tension-type headache, intractable 05/29/2015   Anxiety and depression 04/12/2015   Asthma, mild intermittent 04/12/2015    Bladder polyp 04/12/2015   Carpal tunnel syndrome 04/12/2015   Binocular vision disorder with diplopia 04/12/2015   Dyslipidemia 04/12/2015   H/O: HTN (hypertension) 04/12/2015   Hemorrhoids, internal 04/12/2015   Neuropathy 04/12/2015   Nuclear sclerotic cataract 04/12/2015   Morbid obesity (HCC) 04/12/2015   Obstructive apnea 04/12/2015   Perennial allergic rhinitis 04/12/2015   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/12/2015   Type 2 diabetes, controlled, with neuropathy (HCC) 04/12/2015   Vitamin D deficiency 04/12/2015   Cephalalgia 08/16/2014   Bariatric surgery status 10/12/2012    Past Surgical History:  Procedure Laterality Date   ABDOMINAL HYSTERECTOMY  1991   BARIATRIC SURGERY  2014   CARPAL TUNNEL RELEASE Left 06/08/2019   Procedure: LEFT CARPAL TUNNEL RELEASE;  Surgeon: Kathryne Hitch, MD;  Location: [facility];  Service: Orthopedics;  Laterality: Left;   CARPAL TUNNEL RELEASE Right 08/19/2019   Procedure: RIGHT CARPAL TUNNEL RELEASE;  Surgeon: Kathryne Hitch, MD;  Location: [facility];  Service: Orthopedics;  Laterality: Right;   CATARACT EXTRACTION W/PHACO Left 01/28/2017   Procedure: CATARACT EXTRACTION PHACO AND INTRAOCULAR LENS PLACEMENT (IOC);  Surgeon: Galen Manila, MD;  Location: [facility];  Service: Ophthalmology;  Laterality: Left;  [place] 00:37 AP% 17.8 CDE 6.61 Fluid pack lot # [id number]   COLONOSCOPY  2012   COLONOSCOPY WITH PROPOFOL N/A 09/30/2016   Procedure: COLONOSCOPY WITH PROPOFOL;  Surgeon: Midge Minium, MD;  Location: [facility];  Service: Endoscopy;  Laterality: N/A;  diabetic - diet controlled sleep apnea   KNEE SURGERY Right 1979-1980   OOPHORECTOMY     PARS PLANA VITRECTOMY Left 03/27/2016   Procedure: PARS PLANA VITRECTOMY WITH 25 GAUGE AND MEMBRANE PEEL, INTERNAL LIMITING MEMBRANE;  Surgeon: Edmon Crape, MD;  Location: [facility];  Service: Ophthalmology;  Laterality: Left;    THYROID SURGERY     BX done was benign   UPPER GI ENDOSCOPY     VITRECTOMY Left 04/27/2016    Family History  Problem Relation Age of Onset   Diabetes Mother    Hypertension Mother    Heart disease Mother 13       stent x 1    Migraines Sister    Stroke Maternal Grandfather    Diabetes Paternal Grandfather    Muscular dystrophy Sister    Heart attack Maternal Aunt    Breast cancer Neg Hx    Thyroid disease Neg Hx     Social History   Tobacco Use   Smoking status: Former    Current packs/day: 0.00    Average packs/day: 0.5 packs/day for 3.0 years (1.5 ttl pk-yrs)  Types: Cigarettes    Start date: 06/24/1972    Quit date: 06/25/1975    Years since quitting: 47.5   Smokeless tobacco: Never   Tobacco comments:    smoked as teenager  Substance Use Topics   Alcohol use: No    Alcohol/week: 0.0 standard drinks of alcohol     Current Outpatient Medications:    acetaminophen (TYLENOL) 500 MG tablet, Take 1,000 mg by mouth every 6 (six) hours as needed for mild pain. Reported on 07/06/2015, Disp: , Rfl:    albuterol (VENTOLIN HFA) 108 (90 Base) MCG/ACT inhaler, INHALE 2 PUFFS BY MOUTH EVERY 4 HOURS AS NEEDED FOR WHEEZING OR SHORTNESS OF BREATH., Disp: 18 g, Rfl: 0   Azelastine HCl 137 MCG/SPRAY SOLN, PLACE 1 SPRAY INTO BOTH NOSTRILS 2 (TWO) TIMES DAILY. USE IN EACH NOSTRIL AS DIRECTED, Disp: 90 mL, Rfl: 1   budesonide (RHINOCORT AQUA) 32 MCG/ACT nasal spray, Place 2 sprays into both nostrils at bedtime., Disp: 15 mL, Rfl: 2   budesonide-formoterol (SYMBICORT) 80-4.5 MCG/ACT inhaler, Take 2 puffs first thing in am and then another 2 puffs about 12 hours later., Disp: 1 each, Rfl: 12   chlorpheniramine (CHLOR-TRIMETON) 4 MG tablet, Take 4 mg by mouth 2 (two) times daily as needed for allergies., Disp: , Rfl:    cholecalciferol (VITAMIN D3) 25 MCG (1000 UNIT) tablet, Take 1,000 Units by mouth daily., Disp: , Rfl:    Coenzyme Q10 (CO Q 10) 100 MG CAPS, Take 1 capsule by mouth daily.,  Disp: 30 capsule, Rfl: 2   cyanocobalamin (VITAMIN B12) 500 MCG tablet, Take 500 mcg by mouth 3 (three) times a week., Disp: , Rfl:    Dulaglutide (TRULICITY) 0.75 MG/0.5ML SOPN, INJECT 0.75 MG SUBCUTANEOUSLY ONE TIME PER WEEK, Disp: 2 mL, Rfl: 0   DULoxetine (CYMBALTA) 60 MG capsule, TAKE 1 CAPSULE (60 MG TOTAL) BY MOUTH DAILY., Disp: 90 capsule, Rfl: 1   glucose blood (FREESTYLE LITE) test strip, 100 each by Other route daily. Use one strip daily for blood glucose testing, Disp: 100 each, Rfl: 12   Lancets (FREESTYLE) lancets, Use one lancet daily for blood glucose testing, Disp: 100 each, Rfl: 12   montelukast (SINGULAIR) 10 MG tablet, TAKE 1 TABLET BY MOUTH EVERYDAY AT BEDTIME, Disp: 90 tablet, Rfl: 1   Multiple Vitamins-Minerals (MULTIVITAMIN WITH MINERALS) tablet, Take 1 tablet by mouth daily., Disp: , Rfl:    Omega-3 Fatty Acids (FISH OIL) 1200 MG CAPS, Take 2 capsules by mouth daily., Disp: , Rfl:    pyridostigmine (MESTINON) 60 MG tablet, TAKE 1 TABLET AT 8 AM, NOON, AND AT 4 PM. OK TO TAKE EXTRA DOSE AS NEEDED., Disp: 360 tablet, Rfl: 2   rosuvastatin (CRESTOR) 20 MG tablet, TAKE 1 TABLET BY MOUTH EVERY DAY, Disp: 90 tablet, Rfl: 3   telmisartan-hydrochlorothiazide (MICARDIS HCT) 80-25 MG tablet, Take 1 tablet by mouth daily., Disp: 90 tablet, Rfl: 1   traZODone (DESYREL) 50 MG tablet, TAKE 1/2 TO 1 TABLET (25 TO 50 MG TOTAL) BY MOUTH AT BEDTIME, Disp: 90 tablet, Rfl: 1  Allergies  Allergen Reactions   Aspirin Swelling    Upset stomach  Upset stomach    Azithromycin     Contraindicated in Myasthenia    Ace Inhibitors     cough   Amlodipine     Edema of feet and hands   Gabapentin     anxiety   Pregabalin Other (See Comments)    anxiety    I personally reviewed active  problem list, medication list, allergies, family history, social history, health maintenance with the patient/caregiver today.   ROS  Constitutional: Negative for fever , positive for weight change - lost 10  lbs   Respiratory: Negative for cough and shortness of breath.   Cardiovascular: Negative for chest pain or palpitations.  Gastrointestinal: Negative for abdominal pain, no bowel changes.  Musculoskeletal: Negative for gait problem or joint swelling.  Skin: Negative for rash.  Neurological: Negative for dizziness or headache.  No other specific complaints in a complete review of systems (except as listed in HPI above).   Objective  Vitals:   01/08/23 1144  BP: 130/78  Pulse: 82  Resp: 16  SpO2: 98%  Weight: 250 lb (113.4 kg)  Height: 5\' 2"  (1.575 m)    Body mass index is 45.73 kg/m.  Physical Exam  Constitutional: Patient appears well-developed and well-nourished. Obese  No distress.  HEENT: head atraumatic, normocephalic, pupils equal and reactive to light, neck supple Cardiovascular: Normal rate, regular rhythm and normal heart sounds.  No murmur heard. No BLE edema. Pulmonary/Chest: Effort normal and breath sounds normal. No respiratory distress. Abdominal: Soft.  There is no tenderness. Psychiatric: Patient has a normal mood and affect. behavior is normal. Judgment and thought content normal.    PHQ2/9:    01/08/2023   11:45 AM 08/22/2022    8:43 AM 07/12/2022    2:02 PM 03/13/2022    1:50 PM 09/28/2021   10:30 AM  Depression screen PHQ 2/9  Decreased Interest 1 0 0 1 0  Down, Depressed, Hopeless 1 0 0 1 0  PHQ - 2 Score 2 0 0 2 0  Altered sleeping 0 2 2 1  0  Tired, decreased energy 0 0 0 1 0  Change in appetite 0 0 0 0 0  Feeling bad or failure about yourself  0 0 0 0 0  Trouble concentrating 0 0 0 0 0  Moving slowly or fidgety/restless 0 0 0 0 0  Suicidal thoughts 0 0 0 0 0  PHQ-9 Score 2 2 2 4  0  Difficult doing work/chores    Somewhat difficult     phq 9 is positive   Fall Risk:    01/08/2023   11:45 AM 08/22/2022    8:39 AM 07/12/2022    2:02 PM 03/13/2022    1:50 PM 03/08/2022    2:24 PM  Fall Risk   Falls in the past year? 0 0 0 0 0  Number falls  in past yr: 0 0 0 0 0  Injury with Fall? 0 0 0 0 0  Risk for fall due to : No Fall Risks No Fall Risks No Fall Risks No Fall Risks   Follow up Falls prevention discussed Education provided;Falls prevention discussed Falls prevention discussed Falls prevention discussed;Education provided       Functional Status Survey: Is the patient deaf or have difficulty hearing?: No Does the patient have difficulty seeing, even when wearing glasses/contacts?: No Does the patient have difficulty concentrating, remembering, or making decisions?: No Does the patient have difficulty walking or climbing stairs?: No Does the patient have difficulty dressing or bathing?: No Does the patient have difficulty doing errands alone such as visiting a doctor's office or shopping?: No    Assessment & Plan  1. Type 2 diabetes, controlled, with neuropathy (HCC)  - POCT HgB A1C - Urine Microalbumin w/creat. ratio - tirzepatide (MOUNJARO) 5 MG/0.5ML Pen; Inject 5 mg into the skin once a week.  Dispense: 6 mL; Refill: 0 - DULoxetine (CYMBALTA) 60 MG capsule; TAKE 1 CAPSULE (60 MG TOTAL) BY MOUTH DAILY.  Dispense: 90 capsule; Refill: 1 - glucose blood (FREESTYLE LITE) test strip; 100 each by Other route daily. Use one strip daily for blood glucose testing  Dispense: 100 each; Refill: 12 - Lancets (FREESTYLE) lancets; Use one lancet daily for blood glucose testing  Dispense: 100 each; Refill: 12  2. Morbidly obese (HCC)  Discussed with the patient the risk posed by an increased BMI. Discussed importance of portion control, calorie counting and at least 150 minutes of physical activity weekly. Avoid sweet beverages and drink more water. Eat at least 6 servings of fruit and vegetables daily    3. Seronegative myasthenia gravis (HCC)  Keep follow up with neurologist   4. History of biopsy of bladder   5. Urinary frequency  - POCT Urinalysis Dipstick - CULTURE, URINE COMPREHENSIVE - POCT Urinalysis  Dipstick  6. Morbid obesity (HCC)  Discussed with the patient the risk posed by an increased BMI. Discussed importance of portion control, calorie counting and at least 150 minutes of physical activity weekly. Avoid sweet beverages and drink more water. Eat at least 6 servings of fruit and vegetables daily    7. Obstructive apnea   8. History of bariatric surgery   9. Multinodular goiter  - Ambulatory referral to Endocrinology  10. Diabetes mellitus with coincident hypertension (HCC)  - telmisartan-hydrochlorothiazide (MICARDIS HCT) 80-25 MG tablet; Take 1 tablet by mouth daily.  Dispense: 90 tablet; Refill: 1  11. Perennial allergic rhinitis  - montelukast (SINGULAIR) 10 MG tablet; TAKE 1 TABLET BY MOUTH EVERYDAY AT BEDTIME  Dispense: 90 tablet; Refill: 1  12. Dyslipidemia  - rosuvastatin (CRESTOR) 20 MG tablet; Take 1 tablet (20 mg total) by mouth daily.  Dispense: 90 tablet; Refill: 1

## 2023-01-08 ENCOUNTER — Ambulatory Visit (INDEPENDENT_AMBULATORY_CARE_PROVIDER_SITE_OTHER): Payer: Medicare Other | Admitting: Family Medicine

## 2023-01-08 ENCOUNTER — Encounter: Payer: Self-pay | Admitting: Family Medicine

## 2023-01-08 VITALS — BP 130/78 | HR 82 | Resp 16 | Ht 62.0 in | Wt 250.0 lb

## 2023-01-08 DIAGNOSIS — G7000 Myasthenia gravis without (acute) exacerbation: Secondary | ICD-10-CM

## 2023-01-08 DIAGNOSIS — Z9889 Other specified postprocedural states: Secondary | ICD-10-CM | POA: Diagnosis not present

## 2023-01-08 DIAGNOSIS — E119 Type 2 diabetes mellitus without complications: Secondary | ICD-10-CM

## 2023-01-08 DIAGNOSIS — R350 Frequency of micturition: Secondary | ICD-10-CM

## 2023-01-08 DIAGNOSIS — G4733 Obstructive sleep apnea (adult) (pediatric): Secondary | ICD-10-CM

## 2023-01-08 DIAGNOSIS — E1140 Type 2 diabetes mellitus with diabetic neuropathy, unspecified: Secondary | ICD-10-CM

## 2023-01-08 DIAGNOSIS — E042 Nontoxic multinodular goiter: Secondary | ICD-10-CM

## 2023-01-08 DIAGNOSIS — Z9884 Bariatric surgery status: Secondary | ICD-10-CM

## 2023-01-08 DIAGNOSIS — I10 Essential (primary) hypertension: Secondary | ICD-10-CM

## 2023-01-08 DIAGNOSIS — E785 Hyperlipidemia, unspecified: Secondary | ICD-10-CM

## 2023-01-08 DIAGNOSIS — J3089 Other allergic rhinitis: Secondary | ICD-10-CM

## 2023-01-08 LAB — POCT URINALYSIS DIPSTICK
Appearance: NORMAL
Bilirubin, UA: NEGATIVE
Blood, UA: NEGATIVE
Glucose, UA: NEGATIVE
Ketones, UA: NEGATIVE
Leukocytes, UA: NEGATIVE
Nitrite, UA: NEGATIVE
Protein, UA: NEGATIVE
Spec Grav, UA: 1.015 (ref 1.010–1.025)
Urobilinogen, UA: 0.2 E.U./dL
pH, UA: 6.5 (ref 5.0–8.0)

## 2023-01-08 LAB — POCT GLYCOSYLATED HEMOGLOBIN (HGB A1C): Hemoglobin A1C: 6 % — AB (ref 4.0–5.6)

## 2023-01-08 MED ORDER — MONTELUKAST SODIUM 10 MG PO TABS
ORAL_TABLET | ORAL | 1 refills | Status: DC
Start: 2023-01-08 — End: 2023-05-14

## 2023-01-08 MED ORDER — TELMISARTAN-HCTZ 80-25 MG PO TABS
1.0000 | ORAL_TABLET | Freq: Every day | ORAL | 1 refills | Status: DC
Start: 2023-01-08 — End: 2023-05-14

## 2023-01-08 MED ORDER — TIRZEPATIDE 5 MG/0.5ML ~~LOC~~ SOAJ
5.0000 mg | SUBCUTANEOUS | 0 refills | Status: DC
Start: 2023-01-08 — End: 2023-05-14

## 2023-01-08 MED ORDER — DULOXETINE HCL 60 MG PO CPEP
ORAL_CAPSULE | Freq: Every day | ORAL | 1 refills | Status: DC
Start: 2023-01-08 — End: 2023-05-14

## 2023-01-08 MED ORDER — FREESTYLE LITE TEST VI STRP
100.0000 | ORAL_STRIP | Freq: Every day | 12 refills | Status: AC
Start: 2023-01-08 — End: ?

## 2023-01-08 MED ORDER — FREESTYLE LANCETS MISC
12 refills | Status: AC
Start: 2023-01-08 — End: ?

## 2023-01-08 MED ORDER — ROSUVASTATIN CALCIUM 20 MG PO TABS
20.0000 mg | ORAL_TABLET | Freq: Every day | ORAL | 1 refills | Status: DC
Start: 2023-01-08 — End: 2023-04-10

## 2023-01-10 LAB — CULTURE, URINE COMPREHENSIVE
MICRO NUMBER:: 15213511
RESULT:: NO GROWTH
SPECIMEN QUALITY:: ADEQUATE

## 2023-01-10 LAB — MICROALBUMIN / CREATININE URINE RATIO
Creatinine, Urine: 126 mg/dL (ref 20–275)
Microalb Creat Ratio: 12 mg/g creat (ref <30)
Microalb, Ur: 1.5 mg/dL

## 2023-01-13 ENCOUNTER — Encounter: Payer: Self-pay | Admitting: Family Medicine

## 2023-01-30 ENCOUNTER — Other Ambulatory Visit: Payer: Self-pay | Admitting: Family Medicine

## 2023-02-01 ENCOUNTER — Other Ambulatory Visit: Payer: Self-pay | Admitting: Family Medicine

## 2023-02-10 ENCOUNTER — Encounter: Payer: Self-pay | Admitting: Endocrinology

## 2023-02-10 ENCOUNTER — Ambulatory Visit (INDEPENDENT_AMBULATORY_CARE_PROVIDER_SITE_OTHER): Payer: Medicare Other | Admitting: Endocrinology

## 2023-02-10 VITALS — BP 126/84 | HR 63 | Ht 62.0 in | Wt 250.0 lb

## 2023-02-10 DIAGNOSIS — R61 Generalized hyperhidrosis: Secondary | ICD-10-CM | POA: Diagnosis not present

## 2023-02-10 DIAGNOSIS — E042 Nontoxic multinodular goiter: Secondary | ICD-10-CM | POA: Diagnosis not present

## 2023-02-10 LAB — TSH: TSH: 2.11 u[IU]/mL (ref 0.35–5.50)

## 2023-02-10 LAB — T4, FREE: Free T4: 0.78 ng/dL (ref 0.60–1.60)

## 2023-02-10 NOTE — Progress Notes (Signed)
Outpatient Endocrinology Note [place] Ebonie Westerlund, MD  02/10/23  Patient's Name: Jasmine Woods    DOB: Jul 20, 1955    MRN: [id number]  REASON OF VISIT: Follow-up for mulidthyroid nodules   PCP: Alba Cory, MD  REFERRING PROVIDER:    HISTORY OF PRESENT ILLNESS:   Jasmine Woods is a 67 y.o. old female with past medical history as listed below is presented for a follow up of thyroid nodules.  Patient was last seen by Dr. Everardo All in April 2023.  Pertinent Thyroid History:  Patient was diagnosed with multiple thyroid nodules in 2017 and had biopsy in 2017 and 2018 [facility], results not available patient says were benign.  She is euthyroid not on thyroid medication.  Serial monitoring of thyroid nodule ultrasound in April 2019, June 2020, July 2021, April 2023 with relatively stable bilateral thyroid nodules.  Ultrasound thyroid in April 2023 showed right thyroid nodule measuring 1.4 cm, 1.2 cm, 1.2 cm and left thyroid nodule measuring 1.5 cm.  There was increase in size of left thyroid nodule, there is result comment note about repeat biopsy versus monitoring, no documentation available about that regarding further discussion with patient.  She complains of occasional neck discomfort and rare choking.  No difficulty breathing.  No hypo and hyperthyroid symptoms.  She has complaints of profuse sweating for 3 months especially during daytime related with activities.  Does not happen at night when sleeping.  It is associated with overall tiredness/fatigue.  No palpitation, increased blood pressure, headache, anxiety.  She has similar sweating problem around 2020 and did get better over time until restarted 3 months ago.  She reports she had cardiology evaluation and were unremarkable.  She has been taking duloxetine/Cymbalta for about 10 years.  She had 24-hour urine metanephrines and catecholamines in June 2019 with mild elevation of norepinephrine otherwise  unremarkable.  She had normal thyroid function test in the past.  She is euthyroid.  Not on thyroid medication.  Interval history 02/10/23 She is presented for the follow-up of multiple thyroid nodules.  She has some neck compressive symptoms however has not been worsening, no change.  She has complaints of profuse sweating otherwise no complaints today.  REVIEW OF SYSTEMS:  As per history of present illness.   PAST MEDICAL HISTORY: Past Medical History:  Diagnosis Date   Allergy    Anxiety    Asthma    Bladder polyp    Dr. Orson Slick   Carpal tunnel syndrome    Complication of anesthesia    needed nebulizer after surgery   Depression    Diabetes mellitus without complication (HCC)    Diet controlled   Double vision    seen by Dr. 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   Dyslipidemia    Frequency of urination    Glaucoma, left eye    H/O myasthenia gravis    MOSTLY AFFECTS EYES   HA (headache)    Heart murmur    followed by PCP   Hx of bariatric surgery    Hypertension    no meds   Internal hemorrhoids    Myasthenia gravis (HCC)    "mostly effects eyes"   Myasthenia gravis (HCC)    Neuropathy, generalized    diabetic on feet   Nuclear sclerotic cataract of both eyes    Obesity (BMI 30-39.9)    Obstructive sleep apnea on CPAP    uses cpap   Patient is Jehovah's Witness    no blood products   Rotator cuff  tendonitis 07/2009   also has impingment right shoulder pain, seen by Dr. Martha Clan   Sleep apnea    Thyroid nodule    Vaginal dryness, menopausal    Vitamin D deficiency     PAST SURGICAL HISTORY: Past Surgical History:  Procedure Laterality Date   ABDOMINAL HYSTERECTOMY  1991   BARIATRIC SURGERY  2014   CARPAL TUNNEL RELEASE Left 06/08/2019   Procedure: LEFT CARPAL TUNNEL RELEASE;  Surgeon: Kathryne Hitch, MD;  Location: [facility];  Service: Orthopedics;  Laterality: Left;   CARPAL TUNNEL RELEASE Right 08/19/2019   Procedure:  RIGHT CARPAL TUNNEL RELEASE;  Surgeon: Kathryne Hitch, MD;  Location: [facility];  Service: Orthopedics;  Laterality: Right;   CATARACT EXTRACTION W/PHACO Left 01/28/2017   Procedure: CATARACT EXTRACTION PHACO AND INTRAOCULAR LENS PLACEMENT (IOC);  Surgeon: Galen Manila, MD;  Location: [facility];  Service: Ophthalmology;  Laterality: Left;  [place] 00:37 AP% 17.8 CDE 6.61 Fluid pack lot # [id number]   COLONOSCOPY  2012   COLONOSCOPY WITH PROPOFOL N/A 09/30/2016   Procedure: COLONOSCOPY WITH PROPOFOL;  Surgeon: Midge Minium, MD;  Location: [facility];  Service: Endoscopy;  Laterality: N/A;  diabetic - diet controlled sleep apnea   KNEE SURGERY Right 1979-1980   OOPHORECTOMY     PARS PLANA VITRECTOMY Left 03/27/2016   Procedure: PARS PLANA VITRECTOMY WITH 25 GAUGE AND MEMBRANE PEEL, INTERNAL LIMITING MEMBRANE;  Surgeon: Edmon Crape, MD;  Location: [facility];  Service: Ophthalmology;  Laterality: Left;   THYROID SURGERY     BX done was benign   UPPER GI ENDOSCOPY     VITRECTOMY Left 04/27/2016    ALLERGIES: Allergies  Allergen Reactions   Aspirin Swelling    Upset stomach  Upset stomach    Azithromycin     Contraindicated in Myasthenia    Ace Inhibitors     cough   Amlodipine     Edema of feet and hands   Gabapentin     anxiety   Pregabalin Other (See Comments)    anxiety    FAMILY HISTORY:  Family History  Problem Relation Age of Onset   Diabetes Mother    Hypertension Mother    Heart disease Mother 70       stent x 1    Migraines Sister    Stroke Maternal Grandfather    Diabetes Paternal Grandfather    Muscular dystrophy Sister    Heart attack Maternal Aunt    Breast cancer Neg Hx    Thyroid disease Neg Hx     SOCIAL HISTORY: Social History   Socioeconomic History   Marital status: Widowed    Spouse name: Chrissie Noa "Butch"   Number of children: 2   Years of education: 12   Highest education level: Associate degree: occupational,  Scientist, product/process development, or vocational program  Occupational History   Occupation: [employer]  Tobacco Use   Smoking status: Former    Current packs/day: 0.00    Average packs/day: 0.5 packs/day for 3.0 years (1.5 ttl pk-yrs)    Types: Cigarettes    Start date: 06/24/1972    Quit date: 06/25/1975    Years since quitting: 47.6   Smokeless tobacco: Never   Tobacco comments:    smoked as teenager  Vaping Use   Vaping status: Never Used  Substance and Sexual Activity   Alcohol use: No    Alcohol/week: 0.0 standard drinks of alcohol   Drug use: No   Sexual activity: Not  Currently    Partners: Male  Other Topics Concern   Not on file  Social History Narrative   Right handed   One story home   12 years education   Retired from [employer] with husband   Social Determinants of Health   Financial Resource Strain: Low Risk  (08/22/2022)   Overall Financial Resource Strain (CARDIA)    Difficulty of Paying Living Expenses: Not hard at all  Food Insecurity: No Food Insecurity (08/22/2022)   Hunger Vital Sign    Worried About Running Out of Food in the Last Year: Never true    Ran Out of Food in the Last Year: Never true  Transportation Needs: No Transportation Needs (08/22/2022)   PRAPARE - Administrator, Civil Service (Medical): No    Lack of Transportation (Non-Medical): No  Physical Activity: Inactive (08/22/2022)   Exercise Vital Sign    Days of Exercise per Week: 0 days    Minutes of Exercise per Session: 0 min  Stress: No Stress Concern Present (08/22/2022)   [employer] of Occupational Health - Occupational Stress Questionnaire    Feeling of Stress : Not at all  Social Connections: Moderately Integrated (08/22/2022)   Social Connection and Isolation Panel [NHANES]    Frequency of Communication with Friends and Family: More than three times a week    Frequency of Social Gatherings with Friends and Family: More than three times a week    Attends Religious Services:  More than 4 times per year    Active Member of [employer] or Organizations: Yes    Attends Banker Meetings: More than 4 times per year    Marital Status: Widowed    MEDICATIONS:  Current Outpatient Medications  Medication Sig Dispense Refill   acetaminophen (TYLENOL) 500 MG tablet Take 1,000 mg by mouth every 6 (six) hours as needed for mild pain. Reported on 07/06/2015     albuterol (VENTOLIN HFA) 108 (90 Base) MCG/ACT inhaler INHALE 2 PUFFS BY MOUTH EVERY 4 HOURS AS NEEDED FOR WHEEZING OR SHORTNESS OF BREATH. 18 g 0   Azelastine HCl 137 MCG/SPRAY SOLN PLACE 1 SPRAY INTO BOTH NOSTRILS 2 (TWO) TIMES DAILY. USE IN EACH NOSTRIL AS DIRECTED 90 mL 1   budesonide (RHINOCORT AQUA) 32 MCG/ACT nasal spray Place 2 sprays into both nostrils at bedtime. 15 mL 2   budesonide-formoterol (SYMBICORT) 80-4.5 MCG/ACT inhaler Take 2 puffs first thing in am and then another 2 puffs about 12 hours later. 1 each 12   chlorpheniramine (CHLOR-TRIMETON) 4 MG tablet Take 4 mg by mouth 2 (two) times daily as needed for allergies.     cholecalciferol (VITAMIN D3) 25 MCG (1000 UNIT) tablet Take 1,000 Units by mouth daily.     Coenzyme Q10 (CO Q 10) 100 MG CAPS Take 1 capsule by mouth daily. 30 capsule 2   cyanocobalamin (VITAMIN B12) 500 MCG tablet Take 500 mcg by mouth 3 (three) times a week.     DULoxetine (CYMBALTA) 60 MG capsule TAKE 1 CAPSULE (60 MG TOTAL) BY MOUTH DAILY. 90 capsule 1   glucose blood (FREESTYLE LITE) test strip 100 each by Other route daily. Use one strip daily for blood glucose testing 100 each 12   Lancets (FREESTYLE) lancets Use one lancet daily for blood glucose testing 100 each 12   montelukast (SINGULAIR) 10 MG tablet TAKE 1 TABLET BY MOUTH EVERYDAY AT BEDTIME 90 tablet 1   Multiple Vitamins-Minerals (MULTIVITAMIN WITH MINERALS) tablet Take  1 tablet by mouth daily.     Omega-3 Fatty Acids (FISH OIL) 1200 MG CAPS Take 2 capsules by mouth daily.     pyridostigmine (MESTINON) 60 MG  tablet TAKE 1 TABLET AT 8 AM, NOON, AND AT 4 PM. OK TO TAKE EXTRA DOSE AS NEEDED. 360 tablet 2   rosuvastatin (CRESTOR) 20 MG tablet Take 1 tablet (20 mg total) by mouth daily. 90 tablet 1   telmisartan-hydrochlorothiazide (MICARDIS HCT) 80-25 MG tablet Take 1 tablet by mouth daily. 90 tablet 1   tirzepatide (MOUNJARO) 5 MG/0.5ML Pen Inject 5 mg into the skin once a week. 6 mL 0   traZODone (DESYREL) 50 MG tablet TAKE 1/2 TO 1 TABLET (25 TO 50 MG TOTAL) BY MOUTH AT BEDTIME 90 tablet 1   No current facility-administered medications for this visit.    PHYSICAL EXAM: Vitals:   02/10/23 1246  BP: 126/84  Pulse: 63  SpO2: 99%  Weight: 250 lb (113.4 kg)  Height: 5\' 2"  (1.575 m)   Body mass index is 45.73 kg/m.    General: Well developed, well nourished female in no apparent distress.  HEENT: AT/[place], no external lesions. Hearing intact to the spoken word Eyes: Conjunctiva clear and no icterus. Neck: Trachea midline, neck supple without appreciable thyromegaly or lymphadenopathy and no palpable thyroid nodules Lungs: Clear to auscultation, no wheeze. Respirations not labored Heart: S1S2, Regular in rate and rhythm. No loud murmurs Abdomen: Soft, non tender, non distended, no masses, no striae Neurologic: Alert, oriented, normal speech, deep tendon biceps reflexes normal Extremities: No pedal pitting edema, no tremors of outstretched hands Skin: Warm, color good.  Psychiatric: Does not appear depressed or anxious  PERTINENT HISTORIC LABORATORY AND IMAGING STUDIES:  All pertinent laboratory results were reviewed. Please see HPI also for further details.   TSH  Date Value Ref Range Status  02/10/2023 2.11 0.35 - 5.50 uIU/mL Final  09/26/2021 0.89 0.35 - 5.50 uIU/mL Final  08/04/2019 1.00 0.40 - 4.50 mIU/L Final     ASSESSMENT / PLAN  1. Multiple thyroid nodules   2. Sweating profusely    -Patient was diagnosed with multiple thyroid nodule in 2017, status post FNA in 2017 and 2018  with reportedly benign cytology.  She has a serial monitoring of thyroid nodules with ultrasound, last done in April 2023 with right-sided stable thyroid nodules and increase in left thyroid nodule. -She is euthyroid not on thyroid medication.  Plan: -Will check thyroid function test. -Ultrasound thyroid to monitor thyroid nodules.  # Increased sweating -Patient has complaints of profuse increased sweating all over the body for about 3 months. -He had unremarkable 24-hour urine metanephrines and catecholamines except mild elevation of norepinephrine in 2019.  No obvious features of pheochromocytoma or carcinoid tumor. -This is more likely to be related with taking duloxetine/Cymbalta.  Has been taking duloxetine for neuropathy.   -Discussed that duloxetine is known to cause increased sweating however not harmful unless it is bothersome nothing needs to be done.  Patient will talk with primary care provider to consider other options for duloxetine. -I would like to check plasma free metanephrines today.   Jasmine Woods was seen today for follow-up.  Diagnoses and all orders for this visit:  Multiple thyroid nodules -     T4, free; Future -     TSH; Future -     US THYROID; Future -     TSH -     T4, free  Sweating profusely -  Metanephrines, plasma; Future -     Metanephrines, plasma   Labs reviewed normal thyroid function test.   Latest Reference Range & Units 02/10/23 13:43  TSH 0.35 - 5.50 uIU/mL 2.11  T4,Free(Direct) 0.60 - 1.60 ng/dL 1.61    DISPOSITION Follow up in clinic in 12 months suggested, or earlier if needed.  All questions answered and patient verbalized understanding of the plan.  [place] Aymee Fomby, MD [facility] Endocrinology [facility] [address] [place], [address] [place], [place] [address] Phone # [phone]  At least part of this note was generated using voice recognition software. Inadvertent word errors may have occurred, which were not recognized  during the proofreading process.

## 2023-02-10 NOTE — Patient Instructions (Signed)
Labs today.  US thyroid is plan.

## 2023-02-12 ENCOUNTER — Other Ambulatory Visit: Payer: Self-pay | Admitting: Family Medicine

## 2023-02-12 DIAGNOSIS — Z1231 Encounter for screening mammogram for malignant neoplasm of breast: Secondary | ICD-10-CM

## 2023-02-16 LAB — METANEPHRINES, PLASMA
Metanephrine, Free: <25 pg/mL (ref <=57)
Normetanephrine, Free: 95 pg/mL (ref <=148)
Total Metanephrines-Plasma: 95 pg/mL (ref <=205)

## 2023-02-18 ENCOUNTER — Ambulatory Visit: Payer: Self-pay

## 2023-02-18 ENCOUNTER — Telehealth: Payer: Medicare Other | Admitting: Family Medicine

## 2023-02-18 ENCOUNTER — Ambulatory Visit
Admission: RE | Admit: 2023-02-18 | Discharge: 2023-02-18 | Disposition: A | Discharge status: Home / Self Care | Payer: Medicare Other | Source: Ambulatory Visit | Attending: Endocrinology | Admitting: Endocrinology

## 2023-02-18 DIAGNOSIS — E042 Nontoxic multinodular goiter: Secondary | ICD-10-CM | POA: Insufficient documentation

## 2023-02-18 DIAGNOSIS — B9689 Other specified bacterial agents as the cause of diseases classified elsewhere: Secondary | ICD-10-CM | POA: Diagnosis not present

## 2023-02-18 DIAGNOSIS — J019 Acute sinusitis, unspecified: Secondary | ICD-10-CM

## 2023-02-18 MED ORDER — AMOXICILLIN-POT CLAVULANATE 875-125 MG PO TABS
1.0000 | ORAL_TABLET | Freq: Two times a day (BID) | ORAL | 0 refills | Status: AC
Start: 2023-02-18 — End: 2023-02-25

## 2023-02-18 NOTE — Telephone Encounter (Signed)
Message from [place] E sent at 02/18/2023  1:12 PM EDT  Summary: Seeking appt, symptomatic, no appts soon enough   Headaches, sinus pain/pressure, fatigued, covid negative. Body aches no fever. Symptoms since last week. Over a week now. No appts available soon enough  Best contact: [phone]         Chief Complaint: sinus facial pain /right earache  Symptoms: right ear feels clogged, SOB with activity, fatigue, diaphoretic with activity, post nasal drip, headache, clearing throat, hoarse Frequency: last Monday  Pertinent Negatives: Patient denies fever, blocked nose Disposition: [] ED /[] Urgent Care (no appt availability in office) / [] Appointment(In office/virtual)/ [x]  [facility]/ [] Home Care/ [] Refused Recommended Disposition /[] [facility]/ []  Follow-up with PCP Additional Notes: no appts- assisted pt by making appt for her.   Reason for Disposition  Earache  Answer Assessment - Initial Assessment Questions 1. LOCATION: "Where does it hurt?"      Face, eyes 2. ONSET: "When did the sinus pain start?"  (e.g., hours, days)      Last Monday  3. SEVERITY: "How bad is the pain?"   (Scale 1-10; mild, moderate or severe)   - MILD (1-3): doesn't interfere with normal activities    - MODERATE (4-7): interferes with normal activities (e.g., work or school) or awakens from sleep   - SEVERE (8-10): excruciating pain and patient unable to do any normal activities        6 4. RECURRENT SYMPTOM: "Have you ever had sinus problems before?" If Yes, ask: "When was the last time?" and "What happened that time?"      yes 5. NASAL CONGESTION: "Is the nose blocked?" If Yes, ask: "Can you open it or must you breathe through your mouth?"     No  6. NASAL DISCHARGE: "Do you have discharge from your nose?" If so ask, "What color?"     Yes/yellow  7. FEVER: "Do you have a fever?" If Yes, ask: "What is it, how was it measured, and when did it start?"      no 8. OTHER SYMPTOMS:  "Do you have any other symptoms?" (e.g., sore throat, cough, earache, difficulty breathing)     Right earache (feels clogged), SOB with activity, fatigue, gets diaphoretic with activity, post nasal drip  Protocols used: Sinus Pain or Congestion-A-AH

## 2023-02-18 NOTE — Patient Instructions (Signed)
Jasmine Woods, thank you for joining Freddy Finner, NP for today's virtual visit.  While this provider is not your primary care provider (PCP), if your PCP is located in our provider database this encounter information will be shared with them immediately following your visit.   A [facility] MyChart account gives you access to today's visit and all your visits, tests, and labs performed at [facility] " click here if you don't have a [facility] MyChart account or go to [email or web address]  Consent: (Patient) Jasmine Woods provided verbal consent for this virtual visit at the beginning of the encounter.  Current Medications:  Current Outpatient Medications:    amoxicillin-clavulanate (AUGMENTIN) 875-125 MG tablet, Take 1 tablet by mouth 2 (two) times daily for 7 days., Disp: 14 tablet, Rfl: 0   acetaminophen (TYLENOL) 500 MG tablet, Take 1,000 mg by mouth every 6 (six) hours as needed for mild pain. Reported on 07/06/2015, Disp: , Rfl:    albuterol (VENTOLIN HFA) 108 (90 Base) MCG/ACT inhaler, INHALE 2 PUFFS BY MOUTH EVERY 4 HOURS AS NEEDED FOR WHEEZING OR SHORTNESS OF BREATH., Disp: 18 g, Rfl: 0   Azelastine HCl 137 MCG/SPRAY SOLN, PLACE 1 SPRAY INTO BOTH NOSTRILS 2 (TWO) TIMES DAILY. USE IN EACH NOSTRIL AS DIRECTED, Disp: 90 mL, Rfl: 1   budesonide (RHINOCORT AQUA) 32 MCG/ACT nasal spray, Place 2 sprays into both nostrils at bedtime., Disp: 15 mL, Rfl: 2   budesonide-formoterol (SYMBICORT) 80-4.5 MCG/ACT inhaler, Take 2 puffs first thing in am and then another 2 puffs about 12 hours later., Disp: 1 each, Rfl: 12   chlorpheniramine (CHLOR-TRIMETON) 4 MG tablet, Take 4 mg by mouth 2 (two) times daily as needed for allergies., Disp: , Rfl:    cholecalciferol (VITAMIN D3) 25 MCG (1000 UNIT) tablet, Take 1,000 Units by mouth daily., Disp: , Rfl:    Coenzyme Q10 (CO Q 10) 100 MG CAPS, Take 1 capsule by mouth daily., Disp: 30 capsule, Rfl: 2   cyanocobalamin  (VITAMIN B12) 500 MCG tablet, Take 500 mcg by mouth 3 (three) times a week., Disp: , Rfl:    DULoxetine (CYMBALTA) 60 MG capsule, TAKE 1 CAPSULE (60 MG TOTAL) BY MOUTH DAILY., Disp: 90 capsule, Rfl: 1   glucose blood (FREESTYLE LITE) test strip, 100 each by Other route daily. Use one strip daily for blood glucose testing, Disp: 100 each, Rfl: 12   Lancets (FREESTYLE) lancets, Use one lancet daily for blood glucose testing, Disp: 100 each, Rfl: 12   montelukast (SINGULAIR) 10 MG tablet, TAKE 1 TABLET BY MOUTH EVERYDAY AT BEDTIME, Disp: 90 tablet, Rfl: 1   Multiple Vitamins-Minerals (MULTIVITAMIN WITH MINERALS) tablet, Take 1 tablet by mouth daily., Disp: , Rfl:    Omega-3 Fatty Acids (FISH OIL) 1200 MG CAPS, Take 2 capsules by mouth daily., Disp: , Rfl:    pyridostigmine (MESTINON) 60 MG tablet, TAKE 1 TABLET AT 8 AM, NOON, AND AT 4 PM. OK TO TAKE EXTRA DOSE AS NEEDED., Disp: 360 tablet, Rfl: 2   rosuvastatin (CRESTOR) 20 MG tablet, Take 1 tablet (20 mg total) by mouth daily., Disp: 90 tablet, Rfl: 1   telmisartan-hydrochlorothiazide (MICARDIS HCT) 80-25 MG tablet, Take 1 tablet by mouth daily., Disp: 90 tablet, Rfl: 1   tirzepatide (MOUNJARO) 5 MG/0.5ML Pen, Inject 5 mg into the skin once a week., Disp: 6 mL, Rfl: 0   traZODone (DESYREL) 50 MG tablet, TAKE 1/2 TO 1 TABLET (25 TO 50 MG TOTAL) BY MOUTH AT BEDTIME, Disp:  90 tablet, Rfl: 1   Medications ordered in this encounter:  Meds ordered this encounter  Medications   amoxicillin-clavulanate (AUGMENTIN) 875-125 MG tablet    Sig: Take 1 tablet by mouth 2 (two) times daily for 7 days.    Dispense:  14 tablet    Refill:  0    Order Specific Question:   Supervising Provider    Answer:   Merrilee Jansky [id number]     *If you need refills on other medications prior to your next appointment, please contact your pharmacy*  Follow-Up: Call back or seek an in-person evaluation if the symptoms worsen or if the condition fails to improve as  anticipated.  [facility] [phone]  Other Instructions   -Take meds as prescribed -Rest -Use a cool mist humidifier especially during the winter months when heat dries out the air. - Use saline nose sprays frequently to help soothe nasal passages and promote drainage. -Saline irrigations of the nose can be very helpful if done frequently.             * 4X daily for 1 week*             * Use of a nettie pot can be helpful with this.  *Follow directions with this* *Boiled or distilled water only -stay hydrated by drinking plenty of fluids - Keep thermostat turn down low to prevent drying out sinuses  - For fever or aches or pains- take tylenol or ibuprofen as directed on bottle             * for fevers greater than 101 orally you may alternate ibuprofen and tylenol every 3 hours.  If you do not improve you will need a follow up visit in person.                  If you have been instructed to have an in-person evaluation today at a local Urgent Care facility, please use the link below. It will take you to a list of all of our available [facility], including address, phone number and hours of operation. Please do not delay care.  [facility]  If you or a family member do not have a primary care provider, use the link below to schedule a visit and establish care. When you choose a [facility] primary care physician or advanced practice provider, you gain a long-term partner in health. Find a Primary Care Provider  Learn more about [facility]'s in-office and virtual care options: [facility] - Get Care Now

## 2023-02-18 NOTE — Progress Notes (Signed)
Virtual Visit Consent   Jasmine Woods, you are scheduled for a virtual visit with a [facility] provider today. Just as with appointments in the office, your consent must be obtained to participate. Your consent will be active for this visit and any virtual visit you may have with one of our providers in the next 365 days. If you have a MyChart account, a copy of this consent can be sent to you electronically.  As this is a virtual visit, video technology does not allow for your provider to perform a traditional examination. This may limit your provider's ability to fully assess your condition. If your provider identifies any concerns that need to be evaluated in person or the need to arrange testing (such as labs, EKG, etc.), we will make arrangements to do so. Although advances in technology are sophisticated, we cannot ensure that it will always work on either your end or our end. If the connection with a video visit is poor, the visit may have to be switched to a telephone visit. With either a video or telephone visit, we are not always able to ensure that we have a secure connection.  By engaging in this virtual visit, you consent to the provision of healthcare and authorize for your insurance to be billed (if applicable) for the services provided during this visit. Depending on your insurance coverage, you may receive a charge related to this service.  I need to obtain your verbal consent now. Are you willing to proceed with your visit today? Jasmine Woods has provided verbal consent on 02/18/2023 for a virtual visit (video or telephone). Jasmine Finner, NP  Date: 02/18/2023 2:52 PM  Virtual Visit via Video Note   I, Jasmine Woods, connected with  Jasmine Woods  ([id number], June 17, 1956) on 02/18/23 at  3:00 PM EDT by a video-enabled telemedicine application and verified that I am speaking with the correct person using two identifiers.  Location: Patient:  Virtual Visit Location Patient: Home Provider: Virtual Visit Location Provider: Home Office   I discussed the limitations of evaluation and management by telemedicine and the availability of in person appointments. The patient expressed understanding and agreed to proceed.    History of Present Illness: Jasmine Woods is a 67 y.o. who identifies as a female who was assigned female at birth, and is being seen today for sinus infection symptoms  Onset was 8 days ago with headaches Associated symptoms are sinus pressure, nasal drainage- yellow, fatigue, right ear feels clogged, shortness of breath with activity, diaphoretic with activity, PND, hoarse Modifying factors are allergy medication- rhinocort, Claritin, dual pain reliever,  Denies chest pain, fevers, chills  Asthmatic- takes Symbicort but not having to use rescued inhaler  Exposure to sick contacts- unknown COVID test: negative  Vaccines:  needs recent booster and flu, and rsv    Problems:  Patient Active Problem List   Diagnosis Date Noted   Carpal tunnel syndrome, right upper limb 08/19/2019   Carpal tunnel syndrome, left upper limb 06/08/2019   Multinodular goiter 11/25/2018   Essential hypertension 11/11/2018   DOE (dyspnea on exertion) 04/15/2018   Hyperlipidemia 12/09/2017   Diaphoresis 11/26/2017   Special screening for malignant neoplasms, colon    Epiretinal membrane (ERM) of left eye 09/09/2016   Glaucoma, left eye 09/05/2016   Seronegative myasthenia gravis (HCC) 08/08/2015   Psoriasis 07/06/2015   Stress incontinence 07/06/2015   Chronic tension-type headache, intractable 05/29/2015   Anxiety and depression 04/12/2015  Asthma, mild intermittent 04/12/2015   Bladder polyp 04/12/2015   Carpal tunnel syndrome 04/12/2015   Binocular vision disorder with diplopia 04/12/2015   Dyslipidemia 04/12/2015   H/O: HTN (hypertension) 04/12/2015   Hemorrhoids, internal 04/12/2015   Neuropathy 04/12/2015    Nuclear sclerotic cataract 04/12/2015   Morbid obesity (HCC) 04/12/2015   Obstructive apnea 04/12/2015   Perennial allergic rhinitis 04/12/2015   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/12/2015   Type 2 diabetes, controlled, with neuropathy (HCC) 04/12/2015   Vitamin D deficiency 04/12/2015   Cephalalgia 08/16/2014   Bariatric surgery status 10/12/2012    Allergies:  Allergies  Allergen Reactions   Aspirin Swelling    Upset stomach  Upset stomach    Azithromycin     Contraindicated in Myasthenia    Ace Inhibitors     cough   Amlodipine     Edema of feet and hands   Gabapentin     anxiety   Pregabalin Other (See Comments)    anxiety   Medications:  Current Outpatient Medications:    acetaminophen (TYLENOL) 500 MG tablet, Take 1,000 mg by mouth every 6 (six) hours as needed for mild pain. Reported on 07/06/2015, Disp: , Rfl:    albuterol (VENTOLIN HFA) 108 (90 Base) MCG/ACT inhaler, INHALE 2 PUFFS BY MOUTH EVERY 4 HOURS AS NEEDED FOR WHEEZING OR SHORTNESS OF BREATH., Disp: 18 g, Rfl: 0   Azelastine HCl 137 MCG/SPRAY SOLN, PLACE 1 SPRAY INTO BOTH NOSTRILS 2 (TWO) TIMES DAILY. USE IN EACH NOSTRIL AS DIRECTED, Disp: 90 mL, Rfl: 1   budesonide (RHINOCORT AQUA) 32 MCG/ACT nasal spray, Place 2 sprays into both nostrils at bedtime., Disp: 15 mL, Rfl: 2   budesonide-formoterol (SYMBICORT) 80-4.5 MCG/ACT inhaler, Take 2 puffs first thing in am and then another 2 puffs about 12 hours later., Disp: 1 each, Rfl: 12   chlorpheniramine (CHLOR-TRIMETON) 4 MG tablet, Take 4 mg by mouth 2 (two) times daily as needed for allergies., Disp: , Rfl:    cholecalciferol (VITAMIN D3) 25 MCG (1000 UNIT) tablet, Take 1,000 Units by mouth daily., Disp: , Rfl:    Coenzyme Q10 (CO Q 10) 100 MG CAPS, Take 1 capsule by mouth daily., Disp: 30 capsule, Rfl: 2   cyanocobalamin (VITAMIN B12) 500 MCG tablet, Take 500 mcg by mouth 3 (three) times a week., Disp: , Rfl:    DULoxetine (CYMBALTA) 60 MG  capsule, TAKE 1 CAPSULE (60 MG TOTAL) BY MOUTH DAILY., Disp: 90 capsule, Rfl: 1   glucose blood (FREESTYLE LITE) test strip, 100 each by Other route daily. Use one strip daily for blood glucose testing, Disp: 100 each, Rfl: 12   Lancets (FREESTYLE) lancets, Use one lancet daily for blood glucose testing, Disp: 100 each, Rfl: 12   montelukast (SINGULAIR) 10 MG tablet, TAKE 1 TABLET BY MOUTH EVERYDAY AT BEDTIME, Disp: 90 tablet, Rfl: 1   Multiple Vitamins-Minerals (MULTIVITAMIN WITH MINERALS) tablet, Take 1 tablet by mouth daily., Disp: , Rfl:    Omega-3 Fatty Acids (FISH OIL) 1200 MG CAPS, Take 2 capsules by mouth daily., Disp: , Rfl:    pyridostigmine (MESTINON) 60 MG tablet, TAKE 1 TABLET AT 8 AM, NOON, AND AT 4 PM. OK TO TAKE EXTRA DOSE AS NEEDED., Disp: 360 tablet, Rfl: 2   rosuvastatin (CRESTOR) 20 MG tablet, Take 1 tablet (20 mg total) by mouth daily., Disp: 90 tablet, Rfl: 1   telmisartan-hydrochlorothiazide (MICARDIS HCT) 80-25 MG tablet, Take 1 tablet by mouth daily., Disp: 90 tablet, Rfl: 1  tirzepatide [facility]) 5 MG/0.5ML Pen, Inject 5 mg into the skin once a week., Disp: 6 mL, Rfl: 0   traZODone (DESYREL) 50 MG tablet, TAKE 1/2 TO 1 TABLET (25 TO 50 MG TOTAL) BY MOUTH AT BEDTIME, Disp: 90 tablet, Rfl: 1  Observations/Objective: Patient is well-developed, well-nourished in no acute distress.  Resting comfortably  at home.  Head is normocephalic, atraumatic.  No labored breathing.  Speech is clear and coherent with logical content.  Patient is alert and oriented at baseline.    Assessment and Plan:  1. Acute bacterial sinusitis  - amoxicillin-clavulanate (AUGMENTIN) 875-125 MG tablet; Take 1 tablet by mouth 2 (two) times daily for 7 days.  Dispense: 14 tablet; Refill: 0  -given negative covid test and duration will treat for sinus infection -Take meds as prescribed -Rest -Use a cool mist humidifier especially during the winter months when heat dries out the air. - Use  saline nose sprays frequently to help soothe nasal passages and promote drainage. -Saline irrigations of the nose can be very helpful if done frequently.             * 4X daily for 1 week*             * Use of a nettie pot can be helpful with this.  *Follow directions with this* *Boiled or distilled water only -stay hydrated by drinking plenty of fluids - Keep thermostat turn down low to prevent drying out sinuses  - For fever or aches or pains- take tylenol or ibuprofen as directed on bottle             * for fevers greater than 101 orally you may alternate ibuprofen and tylenol every 3 hours.  If you do not improve you will need a follow up visit in person.                Reviewed side effects, risks and benefits of medication.    Patient acknowledged agreement and understanding of the plan.   Past Medical, Surgical, Social History, Allergies, and Medications have been Reviewed.   Follow Up Instructions: I discussed the assessment and treatment plan with the patient. The patient was provided an opportunity to ask questions and all were answered. The patient agreed with the plan and demonstrated an understanding of the instructions.  A copy of instructions were sent to the patient via MyChart unless otherwise noted below.     The patient was advised to call back or seek an in-person evaluation if the symptoms worsen or if the condition fails to improve as anticipated.  Time:  I spent 10 minutes with the patient via telehealth technology discussing the above problems/concerns.    Jasmine Finner, NP

## 2023-02-26 ENCOUNTER — Encounter: Payer: Self-pay | Admitting: Primary Care

## 2023-02-26 ENCOUNTER — Ambulatory Visit (INDEPENDENT_AMBULATORY_CARE_PROVIDER_SITE_OTHER): Payer: Medicare Other | Admitting: Primary Care

## 2023-02-26 VITALS — BP 126/76 | HR 75 | Temp 97.9°F | Ht 62.0 in | Wt 249.0 lb

## 2023-02-26 DIAGNOSIS — G4733 Obstructive sleep apnea (adult) (pediatric): Secondary | ICD-10-CM | POA: Diagnosis not present

## 2023-02-26 NOTE — Patient Instructions (Addendum)
Sleep apnea appears well-controlled on current pressure settings  Recommend checking overnight oximetry test to ensure you do not have nocturnal hypoxemia  I would follow-up with neurologist and primary care doctor about chronic fatigue symptoms  We can consider starting you on a medication for hypersomnia called Provigil/modafinil (this is a stimulant and we will need to monitor blood pressure and heart rate closely)  Follow-up 6 to 8 weeks with Beth NP or APP  Modafinil Tablets What is this medication? MODAFINIL (moe DAF i nil) treats sleep disorders, such as narcolepsy, obstructive sleep apnea, and shift work disorder. It works by promoting wakefulness. It belongs to a group of medications called stimulants. This medicine may be used for other purposes; ask your health care provider or pharmacist if you have questions. COMMON BRAND NAME(S): Provigil What should I tell my care team before I take this medication? They need to know if you have any of these conditions: Kidney disease Liver disease Mental health conditions An unusual or allergic reaction to modafinil, other medications, foods, dyes, or preservatives Pregnant or trying to get pregnant Breast-feeding How should I use this medication? Take this medication by mouth with water. Take it as directed on the prescription label at the same time every day. You can take it with or without food. If it upsets your stomach, take it with food. Keep taking it unless your care team tells you to stop. A special MedGuide will be given to you by the pharmacist with each prescription and refill. Be sure to read this information carefully each time. Talk to your care team about the use of this medication in children. Special care may be needed. Overdosage: If you think you have taken too much of this medicine contact a poison control center or emergency room at once. NOTE: This medicine is only for you. Do not share this medicine with  others. What if I miss a dose? If you miss a dose, take it as soon as you can. If it is almost time for your next dose, take only that dose. Do not take double or extra doses. What may interact with this medication? Do not take this medication with any of the following: Amphetamine or dextroamphetamine Dexmethylphenidate or methylphenidate MAOIs, such as Marplan, Nardil, and Parnate Pemoline Procarbazine This medication may also interact with the following: Antifungal medications, such as itraconazole or ketoconazole Barbiturates, such as phenobarbital Carbamazepine Cyclosporine Diazepam Estrogen or progestin hormones Medications for mental health conditions Phenytoin Propranolol Triazolam Warfarin This list may not describe all possible interactions. Give your health care provider a list of all the medicines, herbs, non-prescription drugs, or dietary supplements you use. Also tell them if you smoke, drink alcohol, or use illegal drugs. Some items may interact with your medicine. What should I watch for while using this medication? Visit your care team for regular checks on your progress. It may be some time before you see the benefit from this medication. This medication may affect your coordination, reaction time, or judgment. Do not drive or operate machinery until you know how this medication affects you. Sit up or stand slowly to reduce the risk of dizzy or fainting spells. Drinking alcohol with this medication can increase the risk of these side effects. This medication may cause serious skin reactions. They can happen weeks to months after starting the medication. Contact your care team right away if you notice fevers or flu-like symptoms with a rash. The rash may be red or purple and then turn into  blisters or peeling of the skin. You may also notice a red rash with swelling of the face, lips, or lymph nodes in your neck or under your arms. Estrogen and progestin hormones may not  work as well while you are taking this medication. Your care team can help you find the contraceptive option that works for you. It is unknown if the effects of this medication will be increased by the use of caffeine. Caffeine is found in many foods, beverages, and medications. Ask your care team if you should limit or change your intake of caffeine-containing products while on this medication. What side effects may I notice from receiving this medication? Side effects that you should report to your care team as soon as possible: Allergic reactions or angioedema--skin rash, itching or hives, swelling of the face, eyes, lips, tongue, arms, or legs, trouble swallowing or breathing Increase in blood pressure Mood and behavior changes--anxiety, nervousness, confusion, hallucinations, irritability, hostility, thoughts of suicide or self-harm, worsening mood, feelings of depression Rash, fever, and swollen lymph nodes Redness, blistering, peeling, or loosening of the skin, including inside the mouth Side effects that usually do not require medical attention (report to your care team if they continue or are bothersome): Anxiety, nervousness Dizziness Headache Nausea Trouble sleeping This list may not describe all possible side effects. Call your doctor for medical advice about side effects. You may report side effects to FDA at 1-800-FDA-1088. Where should I keep my medication? Keep out of the reach of children and pets. This medication can be abused. Keep it in a safe place to protect it from theft. Do not share it with anyone. It is only for you. Selling or giving away this medication is dangerous and against the law. Store at room temperature between 20 and 25 degrees C (68 and 77 degrees F). Get rid of any unused medication after the expiration date. This medication may cause harm and death if it is taken by other adults, children, or pets. It is important to get rid of the medication as soon as you  no longer need it or it is expired. You can do this in two ways: Take the medication to a medication take-back program. Check with your pharmacy or law enforcement to find a location. If you cannot return the medication, check the label or package insert to see if the medication should be thrown out in the garbage or flushed down the toilet. If you are not sure, ask your care team. If it is safe to put it in the trash, take the medication out of the container. Mix the medication with cat litter, dirt, coffee grounds, or other unwanted substance. Seal the mixture in a bag or container. Put it in the trash. NOTE: This sheet is a summary. It may not cover all possible information. If you have questions about this medicine, talk to your doctor, pharmacist, or health care provider.  2024 [employer]vier/Gold Standard (2022-07-19 00:00:00)

## 2023-02-26 NOTE — Progress Notes (Signed)
@Patient  ID: Jasmine Woods, female    DOB: 02-09-1956, 67 y.o.   MRN: [id number]  Chief Complaint  Patient presents with   Follow-up    Wearing cpap avg 8hr nightly.  Waking up with headache, daytime sleepiness and leg heaviness.     Referring provider: Alba Cory, MD  HPI: 67 year old female, former smoker quit 1977.  Past medical history significant for hypertension, mild intermittent asthma, obstructive sleep apnea, type 2 diabetes, dyslipidemia, morbid obesity.  Former patient of Dr. Nicholos Johns and Dr. Sherene Sires.  02/26/2023 Patient presents today for OV follow-up asthma/OSA. She complains of daytime fatigue and grogginess. She is having morning headaches and leg heaviness. She takes Cymbalta for neuropathy. She lost her husband and son in law in last 2 years. She has been dealing with some grief, but does not feel her reaction has been abnormal. She has MG, symptoms have  affected her vision more so than her muscles. Seeing Dr. Beryl Meager in October in [place]. No falls.   Asthma well controlled on Singulair. No issues with breathing or wheezing.  No longer using Symbicort, not covered by her insurance. No SABA need.   Airview download 01/27/23-02/25/23 Usage days 30/30 days > 4 hours Average usage 9 hours 27 mins Pressure 8-20cm h20 (12.4cm h20-95%) Airleaks 5L/min (95%) AHI 1.1    Allergies  Allergen Reactions   Aspirin Swelling    Upset stomach  Upset stomach    Azithromycin     Contraindicated in Myasthenia    Ace Inhibitors     cough   Amlodipine     Edema of feet and hands   Gabapentin     anxiety   Pregabalin Other (See Comments)    anxiety    Immunization History  Administered Date(s) Administered   Fluad Quad(high Dose 65+) 03/15/2022   Influenza, Quadrivalent, Recombinant, Inj, Pf 03/24/2019   Influenza,inj,Quad PF,6+ Mos 03/22/2020   Influenza-Unspecified 04/06/2015, 04/04/2016, 04/03/2017, 03/18/2018, 03/30/2021   Moderna Covid-19 Fall  Seasonal Vaccine 4yrs & older 03/19/2023   PFIZER Comirnaty(Gray Top)Covid-19 Tri-Sucrose Vaccine 10/17/2020, 03/15/2022   PFIZER(Purple Top)SARS-COV-2 Vaccination 07/09/2019, 07/30/2019, 04/07/2020, 10/17/2020, 03/30/2021   PNEUMOCOCCAL CONJUGATE-20 07/12/2022   Pneumococcal Conjugate-13 07/06/2015, 07/31/2020   Pneumococcal Polysaccharide-23 12/19/2011, 03/18/2017   Tdap 10/24/2005, 12/15/2015   Zoster Recombinant(Shingrix) 11/03/2019, 03/29/2020   Zoster, Live 03/25/2011    Past Medical History:  Diagnosis Date   Allergy    Anxiety    Asthma    Bladder polyp    Dr. Orson Slick   Carpal tunnel syndrome    Complication of anesthesia    needed nebulizer after surgery   Depression    Diabetes mellitus without complication (HCC)    Diet controlled   Double vision    seen by Dr. 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   Dyslipidemia    Frequency of urination    Glaucoma, left eye    H/O myasthenia gravis    MOSTLY AFFECTS EYES   HA (headache)    Heart murmur    followed by PCP   Hx of bariatric surgery    Hypertension    no meds   Internal hemorrhoids    Myasthenia gravis (HCC)    "mostly effects eyes"   Myasthenia gravis (HCC)    Neuropathy, generalized    diabetic on feet   Nuclear sclerotic cataract of both eyes    Obesity (BMI 30-39.9)    Obstructive sleep apnea on CPAP    uses cpap   Patient is Jehovah's Witness  no blood products   Rotator cuff tendonitis 07/2009   also has impingment right shoulder pain, seen by Dr. Martha Clan   Sleep apnea    Thyroid nodule    Vaginal dryness, menopausal    Vitamin D deficiency     Tobacco History: Social History   Tobacco Use  Smoking Status Former   Current packs/day: 0.00   Average packs/day: 0.5 packs/day for 3.0 years (1.5 ttl pk-yrs)   Types: Cigarettes   Start date: 06/24/1972   Quit date: 06/25/1975   Years since quitting: 47.7  Smokeless Tobacco Never  Tobacco Comments   smoked as teenager   Counseling  given: Not Answered Tobacco comments: smoked as teenager   Outpatient Medications Prior to Visit  Medication Sig Dispense Refill   acetaminophen (TYLENOL) 500 MG tablet Take 1,000 mg by mouth every 6 (six) hours as needed for mild pain. Reported on 07/06/2015     albuterol (VENTOLIN HFA) 108 (90 Base) MCG/ACT inhaler INHALE 2 PUFFS BY MOUTH EVERY 4 HOURS AS NEEDED FOR WHEEZING OR SHORTNESS OF BREATH. 18 g 0   Azelastine HCl 137 MCG/SPRAY SOLN PLACE 1 SPRAY INTO BOTH NOSTRILS 2 (TWO) TIMES DAILY. USE IN EACH NOSTRIL AS DIRECTED 90 mL 1   budesonide (RHINOCORT AQUA) 32 MCG/ACT nasal spray Place 2 sprays into both nostrils at bedtime. 15 mL 2   budesonide-formoterol (SYMBICORT) 80-4.5 MCG/ACT inhaler Take 2 puffs first thing in am and then another 2 puffs about 12 hours later. 1 each 12   chlorpheniramine (CHLOR-TRIMETON) 4 MG tablet Take 4 mg by mouth 2 (two) times daily as needed for allergies.     cholecalciferol (VITAMIN D3) 25 MCG (1000 UNIT) tablet Take 1,000 Units by mouth daily.     Coenzyme Q10 (CO Q 10) 100 MG CAPS Take 1 capsule by mouth daily. 30 capsule 2   cyanocobalamin (VITAMIN B12) 500 MCG tablet Take 500 mcg by mouth 3 (three) times a week.     DULoxetine (CYMBALTA) 60 MG capsule TAKE 1 CAPSULE (60 MG TOTAL) BY MOUTH DAILY. 90 capsule 1   glucose blood (FREESTYLE LITE) test strip 100 each by Other route daily. Use one strip daily for blood glucose testing 100 each 12   Lancets (FREESTYLE) lancets Use one lancet daily for blood glucose testing 100 each 12   montelukast (SINGULAIR) 10 MG tablet TAKE 1 TABLET BY MOUTH EVERYDAY AT BEDTIME 90 tablet 1   Multiple Vitamins-Minerals (MULTIVITAMIN WITH MINERALS) tablet Take 1 tablet by mouth daily.     Omega-3 Fatty Acids (FISH OIL) 1200 MG CAPS Take 2 capsules by mouth daily.     pyridostigmine (MESTINON) 60 MG tablet TAKE 1 TABLET AT 8 AM, NOON, AND AT 4 PM. OK TO TAKE EXTRA DOSE AS NEEDED. 360 tablet 2   rosuvastatin (CRESTOR) 20 MG  tablet Take 1 tablet (20 mg total) by mouth daily. 90 tablet 1   telmisartan-hydrochlorothiazide (MICARDIS HCT) 80-25 MG tablet Take 1 tablet by mouth daily. 90 tablet 1   tirzepatide (MOUNJARO) 5 MG/0.5ML Pen Inject 5 mg into the skin once a week. 6 mL 0   traZODone (DESYREL) 50 MG tablet TAKE 1/2 TO 1 TABLET (25 TO 50 MG TOTAL) BY MOUTH AT BEDTIME 90 tablet 1   No facility-administered medications prior to visit.   Review of Systems  Review of Systems  Constitutional:  Positive for fatigue.  HENT: Negative.    Respiratory:  Negative for apnea, cough, shortness of breath and wheezing.   Psychiatric/Behavioral:  Negative for sleep disturbance.    Physical Exam  BP 126/76 (BP Location: Left Arm, Cuff Size: Normal)   Pulse 75   Temp 97.9 F (36.6 C) (Temporal)   Ht 5\' 2"  (1.575 m)   Wt 249 lb (112.9 kg)   SpO2 97%   BMI 45.54 kg/m  Physical Exam Constitutional:      General: She is not in acute distress.    Appearance: Normal appearance. She is obese. She is not ill-appearing.  HENT:     Head: Normocephalic and atraumatic.  Cardiovascular:     Rate and Rhythm: Normal rate.  Pulmonary:     Effort: Pulmonary effort is normal.     Breath sounds: Normal breath sounds.  Musculoskeletal:        General: Normal range of motion.  Skin:    General: Skin is warm and dry.  Neurological:     General: No focal deficit present.     Mental Status: She is alert and oriented to person, place, and time. Mental status is at baseline.  Psychiatric:        Mood and Affect: Mood normal.        Behavior: Behavior normal.        Thought Content: Thought content normal.        Judgment: Judgment normal.      Lab Results:  CBC    Component Value Date/Time   WBC 10.5 11/15/2022 0814   RBC 4.47 11/15/2022 0814   HGB 12.7 11/15/2022 0814   HGB 13.5 08/16/2015 1618   HCT 37.8 11/15/2022 0814   HCT 40.6 08/16/2015 1618   PLT 346 11/15/2022 0814   PLT 266 08/16/2015 1618   MCV 84.6  11/15/2022 0814   MCV 87 08/16/2015 1618   MCV 86 10/17/2014 1718   MCH 28.4 11/15/2022 0814   MCHC 33.6 11/15/2022 0814   RDW 13.8 11/15/2022 0814   RDW 13.2 08/16/2015 1618   RDW 12.8 10/17/2014 1718   LYMPHSABS 3,612 11/15/2022 0814   LYMPHSABS 3.9 (H) 08/16/2015 1618   LYMPHSABS 3.6 10/17/2014 1718   MONOABS 1.1 (H) 07/20/2021 1130   MONOABS 0.7 10/17/2014 1718   EOSABS 147 11/15/2022 0814   EOSABS 0.2 08/16/2015 1618   EOSABS 0.1 10/17/2014 1718   BASOSABS 53 11/15/2022 0814   BASOSABS 0.0 08/16/2015 1618   BASOSABS 0.1 10/17/2014 1718    BMET    Component Value Date/Time   NA 139 11/15/2022 0814   NA 140 04/22/2019 1105   NA 139 10/17/2014 1718   K 4.3 11/15/2022 0814   K 4.4 10/17/2014 1718   CL 102 11/15/2022 0814   CL 102 10/17/2014 1718   CO2 26 11/15/2022 0814   CO2 28 10/17/2014 1718   GLUCOSE 87 11/15/2022 0814   GLUCOSE 99 10/17/2014 1718   BUN 17 11/15/2022 0814   BUN 17 04/22/2019 1105   BUN 21 (H) 10/17/2014 1718   CREATININE 0.56 11/15/2022 0814   CALCIUM 9.2 11/15/2022 0814   CALCIUM 9.5 10/17/2014 1718   GFRNONAA >60 06/30/2020 0909   GFRNONAA 98 08/04/2019 1554   GFRAA >60 08/16/2019 1248   GFRAA 114 08/04/2019 1554    BNP No results found for: "BNP"  ProBNP No results found for: "PROBNP"  Imaging: MM 3D SCREENING MAMMOGRAM BILATERAL BREAST  Result Date: 02/27/2023 CLINICAL DATA:  Screening. EXAM: DIGITAL SCREENING BILATERAL MAMMOGRAM WITH TOMOSYNTHESIS AND CAD TECHNIQUE: Bilateral screening digital craniocaudal and mediolateral oblique mammograms were obtained. Bilateral screening digital breast  tomosynthesis was performed. The images were evaluated with computer-aided detection. COMPARISON:  Previous exam(s). ACR Breast Density Category c: The breasts are heterogeneously dense, which may obscure small masses. FINDINGS: There are no findings suspicious for malignancy. IMPRESSION: No mammographic evidence of malignancy. A result letter of  this screening mammogram will be mailed directly to the patient. RECOMMENDATION: Screening mammogram in one year. (Code:[id number]) BI-RADS CATEGORY  1: Negative. Electronically Signed   By: Annia Belt M.D.   On: 02/27/2023 14:43     Assessment & Plan:   Obstructive apnea - Sleep apnea is well-controlled on current pressure settings. She is 100% compliant with CPAP use greater than 4 hours over the last 30 days.  Current pressure settings 8 to 20 cm H2O; residual AHI 1.1/h.  Recommend checking overnight oximetry test on CPAP to assess for nocturnal hypoxemia. Advised patient continue to wear CPAP nightly for 4 to 6 hours or longer  Fatigue - Sleep apnea is well controlled. Recommend patient follow up with neurology and primary care due to chronic fatigue symptoms.  We can consider starting patient on medication such as Provigil/Modafinil for hypersomnia if no underlying cause of fatigue identified.  Follow-up 6 to 8 weeks with Waynetta Sandy NP or APP   Glenford Bayley, NP 03/24/2023

## 2023-02-27 ENCOUNTER — Ambulatory Visit
Admission: RE | Admit: 2023-02-27 | Discharge: 2023-02-27 | Disposition: A | Discharge status: Home / Self Care | Payer: Medicare Other | Source: Ambulatory Visit | Attending: Family Medicine | Admitting: Family Medicine

## 2023-02-27 DIAGNOSIS — Z1231 Encounter for screening mammogram for malignant neoplasm of breast: Secondary | ICD-10-CM | POA: Diagnosis present

## 2023-03-10 ENCOUNTER — Ambulatory Visit: Payer: Medicare Other | Admitting: Neurology

## 2023-03-24 DIAGNOSIS — R5383 Other fatigue: Secondary | ICD-10-CM | POA: Insufficient documentation

## 2023-03-24 NOTE — Assessment & Plan Note (Signed)
-   Stable; Asthma symptoms are well controlled on Singulair. No issues with shortness of breath or wheezing.  No longer using Symbicort, not covered by her insurance. No SABA need.

## 2023-03-24 NOTE — Assessment & Plan Note (Addendum)
-   Sleep apnea is well-controlled on current pressure settings. She is 100% compliant with CPAP use greater than 4 hours over the last 30 days.  Current pressure settings 8 to 20 cm H2O; residual AHI 1.1/h.  Recommend checking overnight oximetry test on CPAP to assess for nocturnal hypoxemia. Advised patient continue to wear CPAP nightly for 4 to 6 hours or longer

## 2023-03-24 NOTE — Assessment & Plan Note (Signed)
-   Sleep apnea is well controlled. Recommend patient follow up with neurology and primary care due to chronic fatigue symptoms.  We can consider starting patient on medication such as Provigil/Modafinil for hypersomnia if no underlying cause of fatigue identified.

## 2023-04-10 ENCOUNTER — Other Ambulatory Visit: Payer: Self-pay | Admitting: Family Medicine

## 2023-04-10 ENCOUNTER — Ambulatory Visit
Admission: RE | Admit: 2023-04-10 | Discharge: 2023-04-10 | Disposition: A | Discharge status: Home / Self Care | Payer: Medicare Other | Attending: Family Medicine | Admitting: Family Medicine

## 2023-04-10 ENCOUNTER — Ambulatory Visit
Admission: RE | Admit: 2023-04-10 | Discharge: 2023-04-10 | Disposition: A | Discharge status: Home / Self Care | Payer: Medicare Other | Source: Ambulatory Visit | Attending: Family Medicine | Admitting: Family Medicine

## 2023-04-10 ENCOUNTER — Encounter: Payer: Self-pay | Admitting: Family Medicine

## 2023-04-10 ENCOUNTER — Ambulatory Visit: Payer: Medicare Other | Admitting: Family Medicine

## 2023-04-10 VITALS — BP 126/78 | HR 65 | Resp 16 | Ht 62.0 in | Wt 254.0 lb

## 2023-04-10 DIAGNOSIS — M545 Low back pain, unspecified: Secondary | ICD-10-CM

## 2023-04-10 DIAGNOSIS — M25552 Pain in left hip: Secondary | ICD-10-CM | POA: Insufficient documentation

## 2023-04-10 DIAGNOSIS — E785 Hyperlipidemia, unspecified: Secondary | ICD-10-CM

## 2023-04-10 DIAGNOSIS — M5442 Lumbago with sciatica, left side: Secondary | ICD-10-CM | POA: Insufficient documentation

## 2023-04-10 MED ORDER — PREDNISONE 20 MG PO TABS: ORAL_TABLET | ORAL | 0 refills | Status: DC

## 2023-04-10 MED ORDER — BACLOFEN 5 MG PO TABS: 5.0000 mg | ORAL_TABLET | Freq: Three times a day (TID) | ORAL | 3 refills | Status: DC | PRN

## 2023-04-10 NOTE — Progress Notes (Signed)
Patient ID: Jasmine Woods, female    DOB: [birth year], 67 y.o.   MRN: [id number]  PCP: Alba Cory, MD  Chief Complaint  Patient presents with   Hip Pain    L side, walking hurts   Sciatica    X2 months, no falls    Subjective:   Jasmine Woods is a 67 y.o. female, presents to clinic with CC of the following:  Back Pain This is a recurrent (prior minor episodes of low back pain, this is worse than prior) problem. The current episode started more than 1 month ago. The problem occurs constantly. The problem has been gradually worsening since onset. The pain is present in the sacro-iliac and gluteal. The quality of the pain is described as aching. Radiates to: around left side, low buttock and to left hip and prox thigh. The pain is moderate. The pain is The same all the time. Pertinent negatives include no numbness or tingling. Risk factors include sedentary lifestyle and obesity. The treatment provided no relief.  Hip Pain  Incident onset: 1-2 months ago. Incident location: no injury. There was no injury mechanism. The pain is present in the left hip. The quality of the pain is described as aching and shooting. The pain is moderate. The pain has been Worsening (constant and gradually worsening) since onset. Pertinent negatives include no inability to bear weight, loss of motion, loss of sensation, muscle weakness, numbness or tingling. Associated symptoms comments: Worsening pain, aches more with ambulating, new limp, pain all night, limited ROM of left hip. The symptoms are aggravated by movement, palpation and weight bearing. She has tried acetaminophen, NSAIDs and rest for the symptoms. The treatment provided no relief.      Patient Active Problem List   Diagnosis Date Noted   Fatigue 03/24/2023   Carpal tunnel syndrome, right upper limb 08/19/2019   Carpal tunnel syndrome, left upper limb 06/08/2019   Multinodular goiter 11/25/2018   Essential hypertension  11/11/2018   DOE (dyspnea on exertion) 04/15/2018   Hyperlipidemia 12/09/2017   Diaphoresis 11/26/2017   Special screening for malignant neoplasms, colon    Epiretinal membrane (ERM) of left eye 09/09/2016   Glaucoma, left eye 09/05/2016   Seronegative myasthenia gravis (HCC) 08/08/2015   Psoriasis 07/06/2015   Stress incontinence 07/06/2015   Chronic tension-type headache, intractable 05/29/2015   Anxiety and depression 04/12/2015   Asthma, mild intermittent 04/12/2015   Bladder polyp 04/12/2015   Carpal tunnel syndrome 04/12/2015   Binocular vision disorder with diplopia 04/12/2015   Dyslipidemia 04/12/2015   H/O: HTN (hypertension) 04/12/2015   Hemorrhoids, internal 04/12/2015   Neuropathy 04/12/2015   Nuclear sclerotic cataract 04/12/2015   Morbid obesity (HCC) 04/12/2015   Obstructive apnea 04/12/2015   Perennial allergic rhinitis 04/12/2015   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 Reported on 07/06/2015, Disp: , Rfl:    albuterol (VENTOLIN HFA) 108 (90 Base) MCG/ACT inhaler, INHALE 2 PUFFS BY MOUTH EVERY 4 HOURS AS NEEDED FOR WHEEZING OR SHORTNESS OF BREATH., Disp: 18 g, Rfl: 0   Azelastine HCl 137 MCG/SPRAY SOLN, PLACE 1 SPRAY INTO BOTH NOSTRILS 2 (TWO) TIMES DAILY. USE IN EACH NOSTRIL AS  DIRECTED, Disp: 90 mL, Rfl: 1   Baclofen 5 MG TABS, Take 1 tablet (5 mg total) by mouth 3 (three) times daily as needed (msk pain or spasms)., Disp: 90 tablet, Rfl: 3   budesonide (RHINOCORT AQUA) 32 MCG/ACT nasal spray, Place 2 sprays into both nostrils at bedtime., Disp: 15 mL, Rfl: 2   budesonide-formoterol (SYMBICORT) 80-4.5 MCG/ACT inhaler, Take 2 puffs first  thing in am and then another 2 puffs about 12 hours later., Disp: 1 each, Rfl: 12   chlorpheniramine (CHLOR-TRIMETON) 4 MG tablet, Take 4 mg by mouth 2 (two) times daily as needed for allergies., Disp: , Rfl:    cholecalciferol (VITAMIN D3) 25 MCG (1000 UNIT) tablet, Take 1,000 Units by mouth daily., Disp: , Rfl:    Coenzyme Q10 (CO Q 10) 100 MG CAPS, Take 1 capsule by mouth daily., Disp: 30 capsule, Rfl: 2   cyanocobalamin (VITAMIN B12) 500 MCG tablet, Take 500 mcg by mouth 3 (three) times a week., Disp: , Rfl:    DULoxetine (CYMBALTA) 60 MG capsule, TAKE 1 CAPSULE (60 MG TOTAL) BY MOUTH DAILY., Disp: 90 capsule, Rfl: 1   glucose blood (FREESTYLE LITE) test strip, 100 each by Other route daily. Use one strip daily for blood glucose testing, Disp: 100 each, Rfl: 12   Lancets (FREESTYLE) lancets, Use one lancet daily for blood glucose testing, Disp: 100 each, Rfl: 12   montelukast (SINGULAIR) 10 MG tablet, TAKE 1 TABLET BY MOUTH EVERYDAY AT BEDTIME, Disp: 90 tablet, Rfl: 1   Multiple Vitamins-Minerals (MULTIVITAMIN WITH MINERALS) tablet, Take 1 tablet by mouth daily., Disp: , Rfl:    Omega-3 Fatty Acids (FISH OIL) 1200 MG CAPS, Take 2 capsules by mouth daily., Disp: , Rfl:    predniSONE (DELTASONE) 20 MG tablet, 2 tabs poqday 1-3, 1 tabs poqday 4-6, Disp: 9 tablet, Rfl: 0   pyridostigmine (MESTINON) 60 MG tablet, TAKE 1 TABLET AT 8 AM, NOON, AND AT 4 PM. OK TO TAKE EXTRA DOSE AS NEEDED., Disp: 360 tablet, Rfl: 2   rosuvastatin (CRESTOR) 20 MG tablet, Take 1 tablet (20 mg total) by mouth daily., Disp: 90 tablet, Rfl: 1   telmisartan-hydrochlorothiazide (MICARDIS HCT) 80-25 MG tablet, Take 1 tablet by mouth daily., Disp: 90 tablet, Rfl: 1   tirzepatide (MOUNJARO) 5 MG/0.5ML Pen, Inject 5 mg into the skin once a week., Disp: 6 mL, Rfl: 0   traZODone (DESYREL) 50 MG tablet, TAKE 1/2 TO 1 TABLET (25 TO 50 MG TOTAL) BY MOUTH AT BEDTIME, Disp: 90 tablet, Rfl: 1   Allergies  Allergen Reactions   Aspirin  Swelling    Upset stomach  Upset stomach    Azithromycin     Contraindicated in Myasthenia    Ace Inhibitors     cough   Amlodipine     Edema of feet and hands   Gabapentin     anxiety   Pregabalin Other (See Comments)    anxiety     Social History   Tobacco Use   Smoking status: Former    Current packs/day: 0.00    Average packs/day: 0.5 packs/day for 3.0 years (1.5 ttl pk-yrs)    Types: Cigarettes    Start date: 06/24/1972    Quit date: 06/25/1975    Years since quitting: 47.8   Smokeless tobacco: Never   Tobacco comments:    smoked as teenager  Vaping Use   Vaping status: Never Used  Substance Use Topics   Alcohol use: No    Alcohol/week: 0.0  standard drinks of alcohol   Drug use: No      Chart Review Today: I personally reviewed active problem list, medication list, allergies, family history, social history, health maintenance, notes from last encounter, lab results, imaging with the patient/caregiver today.   Review of Systems  Constitutional: Negative.   HENT: Negative.    Eyes: Negative.   Respiratory: Negative.    Cardiovascular: Negative.   Gastrointestinal: Negative.   Endocrine: Negative.   Genitourinary: Negative.   Musculoskeletal:  Positive for back pain.  Skin: Negative.   Allergic/Immunologic: Negative.   Neurological: Negative.  Negative for tingling and numbness.  Hematological: Negative.   Psychiatric/Behavioral: Negative.    All other systems reviewed and are negative.      Objective:   Vitals:   04/10/23 0814  BP: 126/78  Pulse: 65  Resp: 16  SpO2: 99%  Weight: 254 lb (115.2 kg)  Height: 5\' 2"  (1.575 m)    Body mass index is 46.46 kg/m.  Physical Exam Vitals and nursing note reviewed.  Constitutional:      General: She is not in acute distress.    Appearance: She is well-developed. She is obese. She is not toxic-appearing or diaphoretic.  HENT:     Head: Normocephalic and atraumatic.     Nose: Nose normal.  Eyes:      General:        Right eye: No discharge.        Left eye: No discharge.     Conjunctiva/sclera: Conjunctivae normal.  Neck:     Trachea: No tracheal deviation.  Cardiovascular:     Rate and Rhythm: Normal rate and regular rhythm.  Pulmonary:     Effort: Pulmonary effort is normal. No respiratory distress.     Breath sounds: No stridor.  Musculoskeletal:     Lumbar back: Tenderness and bony tenderness present. Normal range of motion. Negative right straight leg raise test and negative left straight leg raise test.     Left hip: Tenderness present. No deformity or crepitus. Decreased range of motion.     Comments: Left greater trochanter ttp, limited external rotation, normal internal rotation, pain with flexion Low back and left SI generalized ttp to lumbar midline spine to left SI area No midline stepoff or deformity  Skin:    General: Skin is warm and dry.     Findings: No rash.  Neurological:     Mental Status: She is alert.     Motor: No abnormal muscle tone.     Coordination: Coordination normal.     Gait: Gait abnormal.  Psychiatric:        Behavior: Behavior normal.      Results for orders placed or performed in visit on 02/10/23  Metanephrines, plasma  Result Value Ref Range   Metanephrine, Free <25 <=57 pg/mL   Normetanephrine, Free 95 <=148 pg/mL   Total Metanephrines-Plasma 95 <=205 pg/mL  TSH  Result Value Ref Range   TSH 2.11 0.35 - 5.50 uIU/mL  T4, free  Result Value Ref Range   Free T4 0.78 0.60 - 1.60 ng/dL       Assessment & Plan:   1. Left hip pain No radiation to groin, located outer hip, possibly IT band vs bursitis  Will get xrays  She is getting some relief with OTC combo tylenol/ibuprofen - encouraged to continue Do feel she needs imaging (screening) ortho and PT - likely will need PT and possible injections - predniSONE (DELTASONE) 20 MG tablet; 2  tabs poqday 1-3, 1 tabs poqday 4-6  Dispense: 9 tablet; Refill: 0 - DG Hip Unilat W OR W/O  Pelvis 2-3 Views Left - DG Lumbar Spine Complete - Ambulatory referral to Orthopedic Surgery - Ambulatory referral to Physical Therapy  2. Acute left-sided low back pain without sciatica Low buttock, left low back pain radiates around left hip, neg SLR but seems very inflammed, resting and doing OTC meds for 2 months with no improvement - predniSONE (DELTASONE) 20 MG tablet; 2 tabs poqday 1-3, 1 tabs poqday 4-6  Dispense: 9 tablet; Refill: 0 - Baclofen 5 MG TABS; Take 1 tablet (5 mg total) by mouth 3 (three) times daily as needed (msk pain or spasms).  Dispense: 90 tablet; Refill: 3 - DG Hip Unilat W OR W/O Pelvis 2-3 Views Left - DG Lumbar Spine Complete - Ambulatory referral to Orthopedic Surgery - Ambulatory referral to Physical Therapy  3. Acute midline low back pain without sciatica Same as above - predniSONE (DELTASONE) 20 MG tablet; 2 tabs poqday 1-3, 1 tabs poqday 4-6  Dispense: 9 tablet; Refill: 0 - Baclofen 5 MG TABS; Take 1 tablet (5 mg total) by mouth 3 (three) times daily as needed (msk pain or spasms).  Dispense: 90 tablet; Refill: 3 - DG Hip Unilat W OR W/O Pelvis 2-3 Views Left - DG Lumbar Spine Complete - Ambulatory referral to Orthopedic Surgery - Ambulatory referral to Physical Therapy  F/up with [facility]o/PT    Danelle Berry, PA-C 04/10/23 9:09 AM

## 2023-04-10 NOTE — Patient Instructions (Addendum)
You take take tylenol up to 1000 mg 4 x a day, max 4000 mg in 24 hours and ibuprofen 400 mg up to 4x a day - recommend continuing your current combo medication while it is helping you Recommend heat therapy and frequent gentle movement to avoid prolonged immobility The steroids should help calm down inflammation but I do think you will need physical therapy and ortho's help to get you feeling better.   You can try the muscle relaxer baclofen if you have very stiff or spasming muscles.  It can be sedating so do not drive with it.    Heat Therapy Heat therapy can help ease sore, stiff, injured, and tight muscles and joints. Heat relaxes your muscles. This may help ease your pain and muscle spasms. What are the risks? If you have any of the following conditions, do not use heat therapy unless your doctor says it is okay. These conditions include: New bruises. Open wounds. Any of these in the area being treated: Healing wounds. Infected skin. Scarred skin. Problems with how blood moves through your body (circulation). Loss of feeling (numbness) in the part of your body that is being treated. Unusual swelling of the part of the body that is being treated. Blood clots. Diabetes. Heart disease. Cancer. Not being able to communicate pain. This may include young children and people who have problems with their brain function (dementia). How to use heat therapy There are different kinds of heat therapy. These include: Moist heat pack. Hot water bottle. Electric heating pad. Heated gel pack. Heated wrap. Warm water bath. Your doctor will tell you how to use heat therapy. In general, you should: Place a towel between your skin and the heat source. Leave the heat on for 20-30 minutes. Your skin may turn pink. Take off the heat if your skin turns bright red. This is very important. If you cannot feel pain, heat, or cold, you have a greater risk of getting burned. Your doctor may also tell you  to take a warm water bath. To do this: Put a non-slip pad in the bathtub to prevent a fall. Fill the bathtub with warm water. Check the water temperature. Soak in the water for 15-20 minutes, or as told by your doctor. Be careful when you stand up after the bath. You may feel dizzy. Pat yourself dry after the bath. Do not rub your skin to dry it. General recommendations for heat therapy Be careful not to burn your skin when using heat therapy. High heat or using heat for a long time can cause burns. Do not sleep while using heat therapy. Only use heat therapy while you are awake. Check your skin during heat therapy. Do not use heat therapy if you have a new injury, especially if you have swelling on the injured area. Do not use heat therapy on areas of your skin that are already irritated, such as with a rash or sunburn. Do not use heat therapy if your skin turns bright red. Contact a doctor if: You have blisters, redness, swelling, or loss of feeling in the area where you use heat therapy. You have new pain. You have pain that gets worse. Summary Heat therapy is the use of heat to help ease sore, stiff, injured, and tight muscles and joints. There are different types of heat therapy. Your doctor will tell you which one to use. Only use heat therapy while you are awake. Watch your skin to make sure you do not get burned  while using heat therapy. This information is not intended to replace advice given to you by your health care provider. Make sure you discuss any questions you have with your health care provider. Document Revised: 04/12/2020 Document Reviewed: 04/12/2020 Elsevier Patient Education  2024 [employer].

## 2023-04-15 ENCOUNTER — Ambulatory Visit (INDEPENDENT_AMBULATORY_CARE_PROVIDER_SITE_OTHER): Payer: Medicare Other | Admitting: Neurology

## 2023-04-15 ENCOUNTER — Encounter: Payer: Self-pay | Admitting: Neurology

## 2023-04-15 DIAGNOSIS — G7000 Myasthenia gravis without (acute) exacerbation: Secondary | ICD-10-CM | POA: Diagnosis not present

## 2023-04-15 MED ORDER — PYRIDOSTIGMINE BROMIDE 60 MG PO TABS
ORAL_TABLET | ORAL | 3 refills | Status: DC
Start: 2023-04-15 — End: 2024-04-19

## 2023-04-15 NOTE — Progress Notes (Signed)
Follow-up Visit   Date: 04/15/23   Jasmine Woods MRN: [id number] DOB: Jun 21, 1956   Interim History: Jasmine Woods is a 67 y.o. right-handed Caucasian female with hyperlipidemia, asthma, and diet-controlled diabetes returning to the clinic for follow-up of seronegative myasthenia gravis and essential tremor.  The patient was accompanied to the clinic by self.  IMPRESSION/PLAN: Seronegative ocular myasthenia gravis without exacerbation (diagnosed 2017 with [facility]), stable  - Continue mestinon 60mg  TID at 8am, noon, and 4pm  Essential tremor involving the hands, stable  - Currently tremor is not bothersome enough to take medications  Bilateral carpal tunnel syndrome s/p release   Return to clinic 1 year  -------------------------------------------------------------------------------------- UPDATE 03/08/2022:  She is here for 1 year follow-up.  She has noticed worsening tremor and has difficulty with fine motor tasks, especially when doing crafts.  No issues with her myasthenia.  Specifically, she denies droopiness of the eyelid or double vision.  Her husband passed away in Nov 04, 2022 from heart failure.  Daughter lives next door and she has good social support from her children.   UPDATE 04/15/2023:  She is here for 1 year follow-up visit.  Myasthenia gravis is well-controlled on mestinon 60mg  three times daily.  Hand tremors are unchanged and stable.  She does not want to take medication.   She is having a lot of left hip pain since August and is scheduled to see orthopaedics next month.  Pain is worse with weight bearing and improved with rest.  She is taking ibuprofen 600mg  BID which helps the ease the pain.    Medications:  Current Outpatient Medications on File Prior to Visit  Medication Sig Dispense Refill   acetaminophen (TYLENOL) 500 MG tablet Take 1,000 mg by mouth every 6 (six) hours as needed for mild pain. Reported on 07/06/2015     albuterol (VENTOLIN  HFA) 108 (90 Base) MCG/ACT inhaler INHALE 2 PUFFS BY MOUTH EVERY 4 HOURS AS NEEDED FOR WHEEZING OR SHORTNESS OF BREATH. 18 g 0   Azelastine HCl 137 MCG/SPRAY SOLN PLACE 1 SPRAY INTO BOTH NOSTRILS 2 (TWO) TIMES DAILY. USE IN EACH NOSTRIL AS DIRECTED 90 mL 1   budesonide (RHINOCORT AQUA) 32 MCG/ACT nasal spray Place 2 sprays into both nostrils at bedtime. 15 mL 2   budesonide-formoterol (SYMBICORT) 80-4.5 MCG/ACT inhaler Take 2 puffs first thing in am and then another 2 puffs about 12 hours later. 1 each 12   chlorpheniramine (CHLOR-TRIMETON) 4 MG tablet Take 4 mg by mouth 2 (two) times daily as needed for allergies.     cholecalciferol (VITAMIN D3) 25 MCG (1000 UNIT) tablet Take 1,000 Units by mouth daily.     Coenzyme Q10 (CO Q 10) 100 MG CAPS Take 1 capsule by mouth daily. 30 capsule 2   cyanocobalamin (VITAMIN B12) 500 MCG tablet Take 500 mcg by mouth 3 (three) times a week.     DULoxetine (CYMBALTA) 60 MG capsule TAKE 1 CAPSULE (60 MG TOTAL) BY MOUTH DAILY. 90 capsule 1   glucose blood (FREESTYLE LITE) test strip 100 each by Other route daily. Use one strip daily for blood glucose testing 100 each 12   ibuprofen (ADVIL) 200 MG tablet Take 200 mg by mouth every 6 (six) hours as needed.     Lancets (FREESTYLE) lancets Use one lancet daily for blood glucose testing 100 each 12   montelukast (SINGULAIR) 10 MG tablet TAKE 1 TABLET BY MOUTH EVERYDAY AT BEDTIME 90 tablet 1   Multiple Vitamins-Minerals (MULTIVITAMIN WITH MINERALS)  tablet Take 1 tablet by mouth daily.     Omega-3 Fatty Acids (FISH OIL) 1200 MG CAPS Take 2 capsules by mouth daily.     predniSONE (DELTASONE) 20 MG tablet 2 tabs poqday 1-3, 1 tabs poqday 4-6 9 tablet 0   pyridostigmine (MESTINON) 60 MG tablet TAKE 1 TABLET AT 8 AM, NOON, AND AT 4 PM. OK TO TAKE EXTRA DOSE AS NEEDED. 360 tablet 2   rosuvastatin (CRESTOR) 20 MG tablet TAKE 1 TABLET BY MOUTH EVERY DAY 90 tablet 0   telmisartan-hydrochlorothiazide (MICARDIS HCT) 80-25 MG tablet  Take 1 tablet by mouth daily. 90 tablet 1   tirzepatide (MOUNJARO) 5 MG/0.5ML Pen Inject 5 mg into the skin once a week. 6 mL 0   traZODone (DESYREL) 50 MG tablet TAKE 1/2 TO 1 TABLET (25 TO 50 MG TOTAL) BY MOUTH AT BEDTIME 90 tablet 1   Baclofen 5 MG TABS Take 1 tablet (5 mg total) by mouth 3 (three) times daily as needed (msk pain or spasms). (Patient not taking: Reported on 04/15/2023) 90 tablet 3   No current facility-administered medications on file prior to visit.    Allergies:  Allergies  Allergen Reactions   Aspirin Swelling    Upset stomach  Upset stomach    Azithromycin     Contraindicated in Myasthenia    Ace Inhibitors     cough   Amlodipine     Edema of feet and hands   Gabapentin     anxiety   Pregabalin Other (See Comments)    anxiety     Vital Signs:  BP 128/68   Pulse 70   Ht 5\' 2"  (1.575 m)   Wt 252 lb (114.3 kg)   SpO2 96%   BMI 46.09 kg/m   Neurological Exam: MENTAL STATUS including orientation to time, place, person, recent and remote memory, attention span and concentration, language, and fund of knowledge is normal.  Speech is not dysarthric.  CRANIAL NERVES:  No visual field defects.  Pupils equal round and reactive to light.  Mildly restricted lateral gaze in the right eye, otherwise normal conjugate, extra-ocular eye movements in all directions of gaze.  No ptosis.  Face is symmetric.  Facial muscles are 5/5.    MOTOR:  Motor strength is 5/5 in all extremities.  There is mild intention tremor bilaterally in the hands, no rest tremor.    MSRs:  Reflexes are 2+/4 throughout.  SENSORY:  Intact to vibration throughout   COORDINATION/GAIT:   Gait mildly-wide based and antalgic due to left hip pain, stable and unassisted.  Data: NCS/EMG of the arms 04/07/2019:  Bilateral median neuropathy at or distal to the wrist, consistent with a clinical diagnosis of carpal tunnel syndrome.  Overall, these findings are moderate in degree electrically and worse  on the left.    Thank you for allowing me to participate in patient's care.  If I can answer any additional questions, I would be pleased to do so.    Sincerely,    Elizabella Nolet K. Allena Katz, DO

## 2023-04-17 ENCOUNTER — Encounter: Payer: Self-pay | Admitting: Family Medicine

## 2023-04-17 ENCOUNTER — Encounter: Payer: Self-pay | Admitting: Neurology

## 2023-04-17 LAB — HM DIABETES EYE EXAM

## 2023-04-18 ENCOUNTER — Ambulatory Visit: Payer: Medicare Other | Admitting: Primary Care

## 2023-04-22 ENCOUNTER — Ambulatory Visit: Payer: Medicare Other | Admitting: Internal Medicine

## 2023-04-25 ENCOUNTER — Encounter: Payer: Self-pay | Admitting: Family Medicine

## 2023-04-28 ENCOUNTER — Encounter: Payer: Self-pay | Admitting: Family Medicine

## 2023-04-28 ENCOUNTER — Ambulatory Visit (INDEPENDENT_AMBULATORY_CARE_PROVIDER_SITE_OTHER): Payer: Medicare Other | Admitting: Family Medicine

## 2023-04-28 VITALS — BP 124/82 | HR 65 | Temp 97.5°F | Resp 16 | Ht 62.0 in | Wt 254.6 lb

## 2023-04-28 DIAGNOSIS — M5442 Lumbago with sciatica, left side: Secondary | ICD-10-CM | POA: Diagnosis not present

## 2023-04-28 DIAGNOSIS — M5441 Lumbago with sciatica, right side: Secondary | ICD-10-CM

## 2023-04-28 MED ORDER — DEXAMETHASONE SODIUM PHOSPHATE 10 MG/ML IJ SOLN
10.0000 mg | Freq: Once | INTRAMUSCULAR | Status: AC
Start: 2023-04-28 — End: 2023-04-28
  Administered 2023-04-28: 10 mg via INTRAMUSCULAR

## 2023-04-28 MED ORDER — PANTOPRAZOLE SODIUM 40 MG PO TBEC
40.0000 mg | DELAYED_RELEASE_TABLET | Freq: Every day | ORAL | 1 refills | Status: DC
Start: 2023-04-28 — End: 2023-05-20

## 2023-04-28 MED ORDER — TIZANIDINE HCL 4 MG PO TABS
2.0000 mg | ORAL_TABLET | Freq: Three times a day (TID) | ORAL | 2 refills | Status: DC | PRN
Start: 2023-04-28 — End: 2023-07-28

## 2023-04-28 MED ORDER — KETOROLAC TROMETHAMINE 60 MG/2ML IM SOLN
60.0000 mg | Freq: Once | INTRAMUSCULAR | Status: AC
Start: 2023-04-28 — End: 2023-04-28
  Administered 2023-04-28: 30 mg via INTRAMUSCULAR

## 2023-04-28 MED ORDER — PREDNISONE 20 MG PO TABS: ORAL_TABLET | ORAL | 0 refills | Status: DC

## 2023-04-28 MED ORDER — IBUPROFEN 600 MG PO TABS: 600.0000 mg | ORAL_TABLET | Freq: Three times a day (TID) | ORAL | 0 refills | Status: DC | PRN

## 2023-04-28 NOTE — Patient Instructions (Addendum)
Start taking protonix to protect your stomach You can try ibuprofen 400-600 3 x a day Restart tylenol [phone] up to 4 x a day Change the muscle relaxers Start the steroids today with food in your stomach - your sugars will go higher - please call us if they are over 200-300 Call and ask to get PT sooner You can also go into [facility] sooner for urgent care appointment or walk in and see if they can reassess you or help with a injection etc. If you have profound weakness/numbness of your leg, incontinence, cannot walk or ambulate then I suggest going to the ER  Sciatica  Sciatica is pain, weakness, tingling, or loss of feeling (numbness) along the sciatic nerve. The sciatic nerve starts in the lower back and goes down the back of each leg. Sciatica usually affects one side of the body. Sciatica usually goes away on its own or with treatment. Sometimes, sciatica may come back. What are the causes? This condition happens when the sciatic nerve is pinched or has pressure put on it. This may be caused by: A disk in between the bones of the spine bulging out too far (herniated disk). Changes in the spinal disks due to aging. A condition that affects a muscle in the butt. Extra bone growth near the sciatic nerve. A break (fracture) of the area between your hip bones (pelvis). Pregnancy. Tumor. This is rare. What increases the risk? You are more likely to develop this condition if you: Play sports that put pressure or stress on the spine. Have poor strength and ease of movement (flexibility). Have had a back injury or back surgery. Sit for long periods of time. Do activities that involve bending or lifting over and over again. Are very overweight (obese). What are the signs or symptoms? Symptoms can vary from mild to very bad. They may include: Any of these problems in the lower back, leg, hip, or butt: Mild tingling, loss of feeling, or dull aches. A burning feeling. Sharp pains. Loss  of feeling in the back of the calf or the sole of the foot. Leg weakness. Very bad back pain that makes it hard to move. These symptoms may get worse when you cough, sneeze, or laugh. They may also get worse when you sit or stand for long periods of time. How is this treated? This condition often gets better without any treatment. However, treatment may include: Changing or cutting back on physical activity when you have pain. Exercising, including strengthening and stretching. Putting ice or heat on the affected area. Shots of medicines to relieve pain and swelling or to relax your muscles. Surgery. Follow these instructions at home: Medicines Take over-the-counter and prescription medicines only as told by your doctor. Ask your doctor if you should avoid driving or using machines while you are taking your medicine. Managing pain     If told, put ice on the affected area. To do this: Put ice in a plastic bag. Place a towel between your skin and the bag. Leave the ice on for 20 minutes, 2-3 times a day. If your skin turns bright red, take off the ice right away to prevent skin damage. The risk of skin damage is higher if you cannot feel pain, heat, or cold. If told, put heat on the affected area. Do this as often as told by your doctor. Use the heat source that your doctor tells you to use, such as a moist heat pack or a heating pad. Place  a towel between your skin and the heat source. Leave the heat on for 20-30 minutes. If your skin turns bright red, take off the heat right away to prevent burns. The risk of burns is higher if you cannot feel pain, heat, or cold. Activity  Return to your normal activities when your doctor says that it is safe. Avoid activities that make your symptoms worse. Take short rests during the day. When you rest for a long time, do some physical activity or stretching between periods of rest. Avoid sitting for a long time without moving. Get up and move  around at least one time each hour. Do exercises and stretches as told by your doctor. Do not lift anything that is heavier than 10 lb (4.5 kg). Avoid lifting heavy things even when you do not have symptoms. Avoid lifting heavy things over and over. When you lift objects, always lift in a way that is safe for your body. To do this, you should: Bend your knees. Keep the object close to your body. Avoid twisting. General instructions Stay at a healthy weight. Wear comfortable shoes that support your feet. Avoid wearing high heels. Avoid sleeping on a mattress that is too soft or too hard. You might have less pain if you sleep on a mattress that is firm enough to support your back. Contact a doctor if: Your pain is not controlled by medicine. Your pain does not get better. Your pain gets worse. Your pain lasts longer than 4 weeks. You lose weight without trying. Get help right away if: You cannot control when you pee (urinate) or poop (have a bowel movement). You have weakness in any of these areas and it gets worse: Lower back. The area between your hip bones. Butt. Legs. You have redness or swelling of your back. You have a burning feeling when you pee. Summary Sciatica is pain, weakness, tingling, or loss of feeling (numbness) along the sciatic nerve. This may include the lower back, legs, hips, and butt. This condition happens when the sciatic nerve is pinched or has pressure put on it. Treatment often includes rest, exercise, medicines, and putting ice or heat on the affected area. This information is not intended to replace advice given to you by your health care provider. Make sure you discuss any questions you have with your health care provider. Document Revised: 09/17/2021 Document Reviewed: 09/17/2021 Elsevier Patient Education  2024 [employer]  Low Back Sprain or Strain Rehab Ask your health care provider which exercises are safe for you. Do exercises exactly as told  by your health care provider and adjust them as directed. It is normal to feel mild stretching, pulling, tightness, or discomfort as you do these exercises. Stop right away if you feel sudden pain or your pain gets worse. Do not begin these exercises until told by your health care provider. Stretching and range-of-motion exercises These exercises warm up your muscles and joints and improve the movement and flexibility of your back. These exercises also help to relieve pain, numbness, and tingling. Lumbar rotation  Lie on your back on a firm bed or the floor with your knees bent. Straighten your arms out to your sides so each arm forms a 90-degree angle (right angle) with a side of your body. Slowly move (rotate) both of your knees to one side of your body until you feel a stretch in your lower back (lumbar). Try not to let your shoulders lift off the floor. Hold this position for __________  seconds. Tense your abdominal muscles and slowly move your knees back to the starting position. Repeat this exercise on the other side of your body. Repeat __________ times. Complete this exercise __________ times a day. Single knee to chest  Lie on your back on a firm bed or the floor with both legs straight. Bend one of your knees. Use your hands to move your knee up toward your chest until you feel a gentle stretch in your lower back and buttock. Hold your leg in this position by holding on to the front of your knee. Keep your other leg as straight as possible. Hold this position for __________ seconds. Slowly return to the starting position. Repeat with your other leg. Repeat __________ times. Complete this exercise __________ times a day. Posture and body mechanics Good posture and healthy body mechanics can help to relieve stress in your body's tissues and joints. Body mechanics refers to the movements and positions of your body while you do your daily activities. Posture is part of body mechanics.  Good posture means: Your spine is in its natural S-curve position (neutral). Your shoulders are pulled back slightly. Your head is not tipped forward (neutral). Follow these guidelines to improve your posture and body mechanics in your everyday activities. Standing  When standing, keep your spine neutral and your feet about hip-width apart. Keep a slight bend in your knees. Your ears, shoulders, and hips should line up. When you do a task in which you stand in one place for a long time, place one foot up on a stable object that is 2-4 inches (5-10 cm) high, such as a footstool. This helps keep your spine neutral. Sitting  When sitting, keep your spine neutral and keep your feet flat on the floor. Use a footrest, if necessary, and keep your thighs parallel to the floor. Avoid rounding your shoulders, and avoid tilting your head forward. When working at a desk or a computer, keep your desk at a height where your hands are slightly lower than your elbows. Slide your chair under your desk so you are close enough to maintain good posture. When working at a computer, place your monitor at a height where you are looking straight ahead and you do not have to tilt your head forward or downward to look at the screen. Resting When lying down and resting, avoid positions that are most painful for you. If you have pain with activities such as sitting, bending, stooping, or squatting, lie in a position in which your body does not bend very much. For example, avoid curling up on your side with your arms and knees near your chest (fetal position). If you have pain with activities such as standing for a long time or reaching with your arms, lie with your spine in a neutral position and bend your knees slightly. Try the following positions: Lying on your side with a pillow between your knees. Lying on your back with a pillow under your knees. Lifting  When lifting objects, keep your feet at least shoulder-width  apart and tighten your abdominal muscles. Bend your knees and hips and keep your spine neutral. It is important to lift using the strength of your legs, not your back. Do not lock your knees straight out. Always ask for help to lift heavy or awkward objects. This information is not intended to replace advice given to you by your health care provider. Make sure you discuss any questions you have with your health care provider. Document  Revised: 10/14/2022 Document Reviewed: 08/28/2020 Elsevier Patient Education  2024 [employer].

## 2023-04-28 NOTE — Progress Notes (Signed)
Patient ID: Jasmine Woods, female    DOB: 09-24-1955, 67 y.o.   MRN: [id number]  PCP: Alba Cory, MD  Chief Complaint  Patient presents with   Follow-up   Hip Pain   Sciatica    On left leg, sx are worst has seen [facility]    Subjective:   Jasmine Woods is a 67 y.o. female, presents to clinic with CC of the following:  HPI   Worse low back pain with worse radiation down her leg- left - goes to knee She tried switching aleve to naproxen, stopped tylenol (was doing dual med) Muscle relaxers help a little bit She is having pain radiate down left leg and the leg giving out, she's also noticed pain spreading up her back to neck and arms She did see [facility] - they looked at xrays and have ordered PT, they did not want to do a steroid shot and recommended conservative management.  She saw them on 10/25, PT isn't until mid Nov and her pain has just worsened She is not having GI upset/N/GERD or pain She has also tried moving very carefully, topical icy hot Pain is much more severe now than 10/17 OV     Patient Active Problem List   Diagnosis Date Noted   Fatigue 03/24/2023   Carpal tunnel syndrome, right upper limb 08/19/2019   Carpal tunnel syndrome, left upper limb 06/08/2019   Multinodular goiter 11/25/2018   Essential hypertension 11/11/2018   DOE (dyspnea on exertion) 04/15/2018   Hyperlipidemia 12/09/2017   Diaphoresis 11/26/2017   Special screening for malignant neoplasms, colon    Epiretinal membrane (ERM) of left eye 09/09/2016   Glaucoma, left eye 09/05/2016   Seronegative myasthenia gravis (HCC) 08/08/2015   Psoriasis 07/06/2015   Stress incontinence 07/06/2015   Chronic tension-type headache, intractable 05/29/2015   Anxiety and depression 04/12/2015   Asthma, mild intermittent 04/12/2015   Bladder polyp 04/12/2015   Carpal tunnel syndrome 04/12/2015   Binocular vision disorder with diplopia 04/12/2015   Dyslipidemia  04/12/2015   H/O: HTN (hypertension) 04/12/2015   Hemorrhoids, internal 04/12/2015   Neuropathy 04/12/2015   Nuclear sclerotic cataract 04/12/2015   Morbid obesity (HCC) 04/12/2015   Obstructive apnea 04/12/2015   Perennial allergic rhinitis 04/12/2015   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 Reported on 07/06/2015, Disp: , Rfl:    albuterol (VENTOLIN HFA) 108 (90 Base) MCG/ACT inhaler, INHALE 2 PUFFS BY MOUTH EVERY 4 HOURS AS NEEDED FOR WHEEZING OR SHORTNESS OF BREATH., Disp: 18 g, Rfl: 0   budesonide (RHINOCORT AQUA) 32 MCG/ACT nasal spray, Place 2 sprays into both nostrils at bedtime., Disp: 15 mL, Rfl: 2   cholecalciferol (VITAMIN D3) 25 MCG (1000 UNIT) tablet, Take 1,000 Units by mouth daily., Disp: , Rfl:    Coenzyme Q10 (CO Q 10) 100 MG CAPS, Take 1 capsule by mouth daily., Disp: 30 capsule, Rfl: 2   cyanocobalamin (VITAMIN B12) 500 MCG tablet, Take 500 mcg by mouth 3 (three) times a week., Disp: , Rfl:    DULoxetine (CYMBALTA) 60 MG capsule, TAKE 1 CAPSULE (60 MG TOTAL) BY MOUTH DAILY., Disp: 90 capsule, Rfl: 1  glucose blood (FREESTYLE LITE) test strip, 100 each by Other route daily. Use one strip daily for blood glucose testing, Disp: 100 each, Rfl: 12   ibuprofen (ADVIL) 200 MG tablet, Take 200 mg by mouth every 6 (six) hours as needed., Disp: , Rfl:    Lancets (FREESTYLE) lancets, Use one lancet daily for blood glucose testing, Disp: 100 each, Rfl: 12   montelukast (SINGULAIR) 10 MG tablet, TAKE 1 TABLET BY MOUTH EVERYDAY AT BEDTIME, Disp: 90 tablet, Rfl: 1   Multiple Vitamins-Minerals (MULTIVITAMIN WITH MINERALS) tablet, Take 1 tablet by mouth daily.,  Disp: , Rfl:    Naproxen Sodium 220 MG CAPS, , Disp: , Rfl:    Omega-3 Fatty Acids (FISH OIL) 1200 MG CAPS, Take 2 capsules by mouth daily., Disp: , Rfl:    pyridostigmine (MESTINON) 60 MG tablet, TAKE 1 TABLET AT 8 AM, NOON, AND AT 4 PM. OK TO TAKE EXTRA DOSE AS NEEDED., Disp: 360 tablet, Rfl: 3   rosuvastatin (CRESTOR) 20 MG tablet, TAKE 1 TABLET BY MOUTH EVERY DAY, Disp: 90 tablet, Rfl: 0   telmisartan-hydrochlorothiazide (MICARDIS HCT) 80-25 MG tablet, Take 1 tablet by mouth daily., Disp: 90 tablet, Rfl: 1   tirzepatide (MOUNJARO) 5 MG/0.5ML Pen, Inject 5 mg into the skin once a week., Disp: 6 mL, Rfl: 0   Azelastine HCl 137 MCG/SPRAY SOLN, PLACE 1 SPRAY INTO BOTH NOSTRILS 2 (TWO) TIMES DAILY. USE IN EACH NOSTRIL AS DIRECTED, Disp: 90 mL, Rfl: 1   Baclofen 5 MG TABS, Take 1 tablet (5 mg total) by mouth 3 (three) times daily as needed (msk pain or spasms). (Patient not taking: Reported on 04/28/2023), Disp: 90 tablet, Rfl: 3   budesonide-formoterol (SYMBICORT) 80-4.5 MCG/ACT inhaler, Take 2 puffs first thing in am and then another 2 puffs about 12 hours later., Disp: 1 each, Rfl: 12   chlorpheniramine (CHLOR-TRIMETON) 4 MG tablet, Take 4 mg by mouth 2 (two) times daily as needed for allergies., Disp: , Rfl:    predniSONE (DELTASONE) 20 MG tablet, 2 tabs poqday 1-3, 1 tabs poqday 4-6, Disp: 9 tablet, Rfl: 0   traZODone (DESYREL) 50 MG tablet, TAKE 1/2 TO 1 TABLET (25 TO 50 MG TOTAL) BY MOUTH AT BEDTIME, Disp: 90 tablet, Rfl: 1   Allergies  Allergen Reactions   Aspirin Swelling    Upset stomach  Upset stomach    Azithromycin     Contraindicated in Myasthenia    Ace Inhibitors     cough   Amlodipine     Edema of feet and hands   Gabapentin     anxiety   Pregabalin Other (See Comments)    anxiety     Social History   Tobacco Use   Smoking status: Former    Current packs/day: 0.00    Average packs/day: 0.5 packs/day for 3.0 years (1.5 ttl pk-yrs)    Types: Cigarettes    Start  date: 06/24/1972    Quit date: 06/25/1975    Years since quitting: 47.8   Smokeless tobacco: Never   Tobacco comments:    smoked as teenager  Vaping Use   Vaping status: Never Used  Substance Use Topics   Alcohol use: No    Alcohol/week: 0.0 standard drinks of alcohol   Drug use: No      Chart Review Today: I personally reviewed active problem list, medication list, allergies, family history, social history, health maintenance, notes from last encounter, lab results, imaging with the patient/caregiver today.   Review of Systems  Constitutional: Negative.   HENT: Negative.    Eyes: Negative.   Respiratory: Negative.    Cardiovascular: Negative.   Gastrointestinal: Negative.   Endocrine: Negative.   Genitourinary: Negative.   Musculoskeletal: Negative.   Skin: Negative.   Allergic/Immunologic: Negative.   Neurological: Negative.   Hematological: Negative.   Psychiatric/Behavioral: Negative.    All other systems reviewed and are negative.      Objective:   Vitals:   04/28/23 0810  BP: 124/82  Pulse: 65  Resp: 16  Temp: (!) 97.5 F (36.4 C)  TempSrc: Temporal  SpO2: 99%  Weight: 254 lb 9.6 oz (115.5 kg)  Height: 5\' 2"  (1.575 m)    Body mass index is 46.57 kg/m.  Physical Exam Vitals and nursing note reviewed.  Constitutional:      General: She is not in acute distress.    Appearance: She is well-developed. She is obese. She is not ill-appearing, toxic-appearing or diaphoretic.  HENT:     Head: Normocephalic and atraumatic.     Nose: Nose normal.  Eyes:     General:        Right eye: No discharge.        Left eye: No discharge.     Conjunctiva/sclera: Conjunctivae normal.  Neck:     Trachea: No tracheal deviation.  Cardiovascular:     Rate and Rhythm: Normal rate and regular rhythm.  Pulmonary:     Effort: Pulmonary effort is normal. No respiratory distress.     Breath sounds: No stridor.  Musculoskeletal:     Cervical back: Tenderness present. No  bony tenderness.     Lumbar back: Tenderness present. No bony tenderness. Decreased range of motion.     Comments: Antalgic gait  Skin:    General: Skin is warm and dry.     Findings: No rash.  Neurological:     Mental Status: She is alert.     Motor: No abnormal muscle tone.     Coordination: Coordination normal.  Psychiatric:        Behavior: Behavior normal.      Results for orders placed or performed in visit on 04/18/23  HM DIABETES EYE EXAM  Result Value Ref Range   HM Diabetic Eye Exam No Retinopathy No Retinopathy       Assessment & Plan:   1. Left-sided low back pain with bilateral sciatica, unspecified chronicity Pain ongoing now for almost 3 months, worse x 1+ week She has est with [facility] and is referred to PT- waiting for first appt Pain is worse now with radicular sx more severe - causing radiation pain, leg giving out - do feel steroids are needed Encouraged her to protect stomach with PPI Change her NSAIDs - failed naproxen and aleve, will try ibuprofen and tylenol alternating Change in muscle relaxer - baclofen is helping a little - she can try higher dose of baclofen or switch to zanaflex- reviewed that she should use one or the other - not take both Pt currently has no red flags - no saddle anesthesia, no incontinence or urine or stool, no fever, she notes some "giving out of left leg" associated with radiation pain - consistent with sciatica - doubt caudaequina  With steroid shot and prednisone PO she will have higher sugars, thankfully her DM is well controlled -  Lab Results  Component Value Date   HGBA1C 6.0 (A) 01/08/2023  Will monitor for CBG's greater than 200  Encouraged her to call [facility] for sooner f/up  appt and PT Reviewed red flags/ER precautions - she verbalized understanding  - tiZANidine (ZANAFLEX) 4 MG tablet; Take 0.5-1.5 tablets (2-6 mg total) by mouth every 8 (eight) hours as needed for muscle spasms (muscle tightness).  Dispense:  90 tablet; Refill: 2 - pantoprazole (PROTONIX) 40 MG tablet; Take 1 tablet (40 mg total) by mouth daily.  Dispense: 30 tablet; Refill: 1 - predniSONE (DELTASONE) 20 MG tablet; 3 tabs poqday 1-3, 2 tabs poqday 4-6, 1 tab poqday 7-9  Dispense: 18 tablet; Refill: 0 - ibuprofen (ADVIL) 600 MG tablet; Take 1 tablet (600 mg total) by mouth every 8 (eight) hours as needed.  Dispense: 30 tablet; Refill: 0 - MR Lumbar Spine Wo Contrast; Future - ketorolac (TORADOL) injection 60 mg - dexamethasone (DECADRON) injection 10 mg       Danelle Berry, PA-C 04/28/23 8:39 AM

## 2023-04-29 ENCOUNTER — Encounter: Payer: Self-pay | Admitting: Neurology

## 2023-05-02 ENCOUNTER — Encounter: Payer: Self-pay | Admitting: Family Medicine

## 2023-05-12 ENCOUNTER — Ambulatory Visit
Admission: RE | Admit: 2023-05-12 | Discharge: 2023-05-12 | Disposition: A | Discharge status: Home / Self Care | Payer: Medicare Other | Source: Ambulatory Visit | Attending: Family Medicine | Admitting: Family Medicine

## 2023-05-12 ENCOUNTER — Encounter: Payer: Self-pay | Admitting: Ophthalmology

## 2023-05-12 DIAGNOSIS — M5442 Lumbago with sciatica, left side: Secondary | ICD-10-CM | POA: Insufficient documentation

## 2023-05-12 DIAGNOSIS — M5441 Lumbago with sciatica, right side: Secondary | ICD-10-CM | POA: Insufficient documentation

## 2023-05-13 ENCOUNTER — Other Ambulatory Visit: Payer: Self-pay | Admitting: Family Medicine

## 2023-05-13 DIAGNOSIS — E1140 Type 2 diabetes mellitus with diabetic neuropathy, unspecified: Secondary | ICD-10-CM

## 2023-05-13 NOTE — Progress Notes (Unsigned)
Name: Jasmine Woods   MRN: [id number]    DOB: Oct 29, 1955   Date:05/14/2023       Progress Note  Subjective  Chief Complaint  Follow Up  HPI  Discussed the use of AI scribe software for clinical note transcription with the patient, who gave verbal consent to proceed.  History of Present Illness   The patient, with a history of myasthenia gravis, fibromyalgia, and obstructive sleep apnea, presents with severe bilateral hip and leg pain that began in early September. The pain originates in the left hip and buttocks, radiates around to the inner thigh and groin, and extends into the quadriceps muscle, but does not reach the knee. More recently, similar pain has developed on the right side, with discomfort deep in the pelvis and extending into the lower leg muscles, particularly at the back. The patient reports general muscle pain and significant mobility impairment due to the pain, necessitating the use of a walker and wheelchair during a recent trip.  The patient has been under the care of a local orthopedic clinic, where she has undergone two sessions of physical therapy and an MRI, the results of which are pending. The patient has been prescribed muscle relaxants and anti-inflammatory medications by her primary care provider, including ibuprofen, Tylenol, and two courses of prednisone. Despite these interventions, the patient reports minimal improvement in pain and a rapid return of symptoms once the prednisone course was completed.  Two different muscle relaxants, baclofen and tizanidine, were trialed with limited success. The patient reports that the tizanidine only provides temporary relief and induces sleepiness. The patient's mobility impairment has significantly impacted her daily activities and lifestyle, as she is typically very active.  The patient has a history of bladder polyps and is due for a follow-up with a urologist. She also has a history of bariatric surgery and is  currently on a medication regimen for weight management, diabetes, hypertension, and cholesterol. The patient is due for cataract surgery later this month. She also reports taking vitamin D and B12 supplements.               Patient Active Problem List   Diagnosis Date Noted   Fatigue 03/24/2023   Carpal tunnel syndrome, right upper limb 08/19/2019   Carpal tunnel syndrome, left upper limb 06/08/2019   Multinodular goiter 11/25/2018   Essential hypertension 11/11/2018   DOE (dyspnea on exertion) 04/15/2018   Hyperlipidemia 12/09/2017   Diaphoresis 11/26/2017   Special screening for malignant neoplasms, colon    Epiretinal membrane (ERM) of left eye 09/09/2016   Glaucoma, left eye 09/05/2016   Seronegative myasthenia gravis (HCC) 08/08/2015   Psoriasis 07/06/2015   Stress incontinence 07/06/2015   Chronic tension-type headache, intractable 05/29/2015   Anxiety and depression 04/12/2015   Asthma, mild intermittent 04/12/2015   Bladder polyp 04/12/2015   Carpal tunnel syndrome 04/12/2015   Binocular vision disorder with diplopia 04/12/2015   Dyslipidemia 04/12/2015   H/O: HTN (hypertension) 04/12/2015   Hemorrhoids, internal 04/12/2015   Neuropathy 04/12/2015   Nuclear sclerotic cataract 04/12/2015   Morbid obesity (HCC) 04/12/2015   Obstructive apnea 04/12/2015   Perennial allergic rhinitis 04/12/2015   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/12/2015   Type 2 diabetes, controlled, with neuropathy (HCC) 04/12/2015   Vitamin D deficiency 04/12/2015   Cephalalgia 08/16/2014   Bariatric surgery status 10/12/2012    Past Surgical History:  Procedure Laterality Date   ABDOMINAL HYSTERECTOMY  1991   BARIATRIC SURGERY  2014   CARPAL  TUNNEL RELEASE Left 06/08/2019   Procedure: LEFT CARPAL TUNNEL RELEASE;  Surgeon: Kathryne Hitch, MD;  Location: [facility];  Service: Orthopedics;  Laterality: Left;   CARPAL TUNNEL RELEASE Right 08/19/2019    Procedure: RIGHT CARPAL TUNNEL RELEASE;  Surgeon: Kathryne Hitch, MD;  Location: [facility];  Service: Orthopedics;  Laterality: Right;   CATARACT EXTRACTION W/PHACO Left 01/28/2017   Procedure: CATARACT EXTRACTION PHACO AND INTRAOCULAR LENS PLACEMENT (IOC);  Surgeon: Galen Manila, MD;  Location: [facility];  Service: Ophthalmology;  Laterality: Left;  [place] 00:37 AP% 17.8 CDE 6.61 Fluid pack lot # [id number]   COLONOSCOPY  2012   COLONOSCOPY WITH PROPOFOL N/A 09/30/2016   Procedure: COLONOSCOPY WITH PROPOFOL;  Surgeon: Midge Minium, MD;  Location: [facility];  Service: Endoscopy;  Laterality: N/A;  diabetic - diet controlled sleep apnea   KNEE SURGERY Right 1979-1980   OOPHORECTOMY     PARS PLANA VITRECTOMY Left 03/27/2016   Procedure: PARS PLANA VITRECTOMY WITH 25 GAUGE AND MEMBRANE PEEL, INTERNAL LIMITING MEMBRANE;  Surgeon: Edmon Crape, MD;  Location: [facility];  Service: Ophthalmology;  Laterality: Left;   THYROID SURGERY     BX done was benign   UPPER GI ENDOSCOPY     VITRECTOMY Left 04/27/2016    Family History  Problem Relation Age of Onset   Diabetes Mother    Hypertension Mother    Heart disease Mother 110       stent x 1    Migraines Sister    Stroke Maternal Grandfather    Diabetes Paternal Grandfather    Muscular dystrophy Sister    Heart attack Maternal Aunt    Breast cancer Neg Hx    Thyroid disease Neg Hx     Social History   Tobacco Use   Smoking status: Former    Current packs/day: 0.00    Average packs/day: 0.5 packs/day for 3.0 years (1.5 ttl pk-yrs)    Types: Cigarettes    Start date: 06/24/1972    Quit date: 06/25/1975    Years since quitting: 47.9   Smokeless tobacco: Never   Tobacco comments:    smoked as teenager  Substance Use Topics   Alcohol use: No    Alcohol/week: 0.0 standard drinks of alcohol     Current Outpatient Medications:    acetaminophen (TYLENOL) 500 MG tablet, Take 1,000 mg by mouth every 6 (six)  hours as needed for mild pain. Reported on 07/06/2015, Disp: , Rfl:    albuterol (VENTOLIN HFA) 108 (90 Base) MCG/ACT inhaler, INHALE 2 PUFFS BY MOUTH EVERY 4 HOURS AS NEEDED FOR WHEEZING OR SHORTNESS OF BREATH., Disp: 18 g, Rfl: 0   budesonide (RHINOCORT AQUA) 32 MCG/ACT nasal spray, Place 2 sprays into both nostrils at bedtime., Disp: 15 mL, Rfl: 2   cholecalciferol (VITAMIN D3) 25 MCG (1000 UNIT) tablet, Take 1,000 Units by mouth daily., Disp: , Rfl:    Coenzyme Q10 (CO Q 10) 100 MG CAPS, Take 1 capsule by mouth daily., Disp: 30 capsule, Rfl: 2   cyanocobalamin (VITAMIN B12) 500 MCG tablet, Take 500 mcg by mouth 3 (three) times a week., Disp: , Rfl:    fluticasone-salmeterol (ADVAIR HFA) 45-21 MCG/ACT inhaler, Inhale 2 puffs into the lungs 2 (two) times daily., Disp: 12 g, Rfl: 1   glucose blood (FREESTYLE LITE) test strip, 100 each by Other route daily. Use one strip daily for blood glucose testing, Disp: 100 each, Rfl: 12   ibuprofen (ADVIL) 600  MG tablet, Take 1 tablet (600 mg total) by mouth every 8 (eight) hours as needed., Disp: 30 tablet, Rfl: 0   Lancets (FREESTYLE) lancets, Use one lancet daily for blood glucose testing, Disp: 100 each, Rfl: 12   Multiple Vitamins-Minerals (MULTIVITAMIN WITH MINERALS) tablet, Take 1 tablet by mouth daily., Disp: , Rfl:    Omega-3 Fatty Acids (FISH OIL) 1200 MG CAPS, Take 2 capsules by mouth daily., Disp: , Rfl:    pantoprazole (PROTONIX) 40 MG tablet, Take 1 tablet (40 mg total) by mouth daily., Disp: 30 tablet, Rfl: 1   pyridostigmine (MESTINON) 60 MG tablet, TAKE 1 TABLET AT 8 AM, NOON, AND AT 4 PM. OK TO TAKE EXTRA DOSE AS NEEDED., Disp: 360 tablet, Rfl: 3   tirzepatide (MOUNJARO) 7.5 MG/0.5ML Pen, Inject 7.5 mg into the skin once a week., Disp: 6 mL, Rfl: 1   tiZANidine (ZANAFLEX) 4 MG tablet, Take 0.5-1.5 tablets (2-6 mg total) by mouth every 8 (eight) hours as needed for muscle spasms (muscle tightness)., Disp: 90 tablet, Rfl: 2   DULoxetine  (CYMBALTA) 60 MG capsule, TAKE 1 CAPSULE (60 MG TOTAL) BY MOUTH DAILY., Disp: 90 capsule, Rfl: 1   montelukast (SINGULAIR) 10 MG tablet, TAKE 1 TABLET BY MOUTH EVERYDAY AT BEDTIME, Disp: 90 tablet, Rfl: 1   rosuvastatin (CRESTOR) 20 MG tablet, Take 1 tablet (20 mg total) by mouth daily., Disp: 90 tablet, Rfl: 1   telmisartan-hydrochlorothiazide (MICARDIS HCT) 80-25 MG tablet, Take 1 tablet by mouth daily., Disp: 90 tablet, Rfl: 1  Allergies  Allergen Reactions   Aspirin Swelling    Upset stomach  Upset stomach    Azithromycin     Contraindicated in Myasthenia    Ace Inhibitors     cough   Amlodipine     Edema of feet and hands   Gabapentin     anxiety   Pregabalin Other (See Comments)    anxiety    I personally reviewed active problem list, medication list, allergies, family history, social history, health maintenance with the patient/caregiver today.   ROS  Ten systems reviewed and is negative except as mentioned in HPI    Objective  Vitals:   05/14/23 1133  BP: 126/72  Pulse: 79  Resp: 18  Temp: (!) 97.3 F (36.3 C)  TempSrc: Oral  SpO2: 94%  Weight: 257 lb 8 oz (116.8 kg)  Height: 5\' 2"  (1.575 m)    Body mass index is 47.1 kg/m.  Physical Exam  Constitutional: Patient appears well-developed and well-nourished. Obese  No distress.  HEENT: head atraumatic, normocephalic, pupils equal and reactive to light, neck supple Cardiovascular: Normal rate, regular rhythm and normal heart sounds.  No murmur heard. No BLE edema. Pulmonary/Chest: Effort normal and breath sounds normal. No respiratory distress. Abdominal: Soft.  There is no tenderness. Psychiatric: Patient has a normal mood and affect. behavior is normal. Judgment and thought content normal.   Recent Results (from the past 2160 hour(s))  HM DIABETES EYE EXAM     Status: None   Collection Time: 04/17/23  7:25 AM  Result Value Ref Range   HM Diabetic Eye Exam No Retinopathy No Retinopathy    Comment:  Abstracted by HIM  POCT HgB A1C     Status: Abnormal   Collection Time: 05/14/23 11:36 AM  Result Value Ref Range   Hemoglobin A1C 6.0 (A) 4.0 - 5.6 %   HbA1c POC (<> result, manual entry)     HbA1c, POC (prediabetic range)  HbA1c, POC (controlled diabetic range)      PHQ2/9:    05/14/2023   11:35 AM 04/28/2023    8:09 AM 04/10/2023    8:13 AM 01/08/2023   11:45 AM 08/22/2022    8:43 AM  Depression screen PHQ 2/9  Decreased Interest 0 0 0 1 0  Down, Depressed, Hopeless 0 0 0 1 0  PHQ - 2 Score 0 0 0 2 0  Altered sleeping 0 0  0 2  Tired, decreased energy 0 0  0 0  Change in appetite 0 0  0 0  Feeling bad or failure about yourself  0 0  0 0  Trouble concentrating 0 0  0 0  Moving slowly or fidgety/restless 0 0  0 0  Suicidal thoughts 0 0  0 0  PHQ-9 Score 0 0  2 2  Difficult doing work/chores  Not difficult at all       phq 9 is negative   Fall Risk:    05/14/2023   11:35 AM 04/28/2023    8:09 AM 04/15/2023    2:14 PM 04/10/2023    8:13 AM 01/08/2023   11:45 AM  Fall Risk   Falls in the past year? 0 0 0 0 0  Number falls in past yr:  0 0 0 0  Injury with Fall?  0 0 0 0  Risk for fall due to : No Fall Risks No Fall Risks  No Fall Risks No Fall Risks  Follow up Falls prevention discussed Falls prevention discussed;Education provided;Falls evaluation completed Falls evaluation completed Falls prevention discussed Falls prevention discussed     Assessment & Plan  Assessment and Plan    Lower Back Pain Pain originating in the left hip, radiating to the buttocks, inner thigh, groin, and quadriceps, not extending to the knee. Pain has now also developed on the right side. MRI pending. Currently on ibuprofen 600mg , Tylenol, and tizanidine for pain management. -Continue ibuprofen 600mg  and Tylenol for pain management. -Continue tizanidine for muscle relaxation. -Wait for MRI results to determine further treatment plan.  Fibromyalgia Currently managed with duloxetine  60mg . Patient reports excessive sweating, possibly related to duloxetine. -Continue duloxetine 60mg  for fibromyalgia management.  Asthma Mild intermittent asthma, managed with Symbicort as needed. -Replace Symbicort with Advair as needed for asthma management.  Type 2 Diabetes A1c stable at 6%. Currently on Mounjaro 5mg , increasing to 7.5mg  for additional weight loss benefit. -Increase Mounjaro to 7.5mg  daily. -Consider adding Weight Watchers regimen for additional weight loss support.  Hyperlipidemia Managed with rosuvastatin. LDL cholesterol was 125 in May. -Continue rosuvastatin daily. -Recheck LDL cholesterol at next visit.  Bladder Polyps No recent follow-up. No current symptoms. -Refer for follow-up with urology.  Myasthenia Gravis Managed with Mestinon 60mg  three times daily. -Continue Mestinon 60mg  TID.  Obstructive Sleep Apnea Managed with CPAP. -Continue CPAP use nightly.  Vitamin D Deficiency Currently taking 1000mg  daily. -Increase Vitamin D to 2000mg  daily.  Vitamin B12 Currently taking every other day. -Continue Vitamin B12 every other day.  General Health Maintenance -Continue montelukast daily for allergies. -Continue pantoprazole daily for gastroprotection while on ibuprofen. -Continue cataract surgery as planned. -Received flu shot and up-to-date on colonoscopy and eye exams.

## 2023-05-14 ENCOUNTER — Encounter: Payer: Self-pay | Admitting: Family Medicine

## 2023-05-14 ENCOUNTER — Other Ambulatory Visit: Payer: Self-pay | Admitting: Family Medicine

## 2023-05-14 ENCOUNTER — Ambulatory Visit: Payer: Medicare Other | Admitting: Family Medicine

## 2023-05-14 VITALS — BP 126/72 | HR 79 | Temp 97.3°F | Resp 18 | Ht 62.0 in | Wt 257.5 lb

## 2023-05-14 DIAGNOSIS — E119 Type 2 diabetes mellitus without complications: Secondary | ICD-10-CM | POA: Diagnosis not present

## 2023-05-14 DIAGNOSIS — Z9884 Bariatric surgery status: Secondary | ICD-10-CM

## 2023-05-14 DIAGNOSIS — E1140 Type 2 diabetes mellitus with diabetic neuropathy, unspecified: Secondary | ICD-10-CM | POA: Diagnosis not present

## 2023-05-14 DIAGNOSIS — E042 Nontoxic multinodular goiter: Secondary | ICD-10-CM

## 2023-05-14 DIAGNOSIS — J4530 Mild persistent asthma, uncomplicated: Secondary | ICD-10-CM

## 2023-05-14 DIAGNOSIS — E785 Hyperlipidemia, unspecified: Secondary | ICD-10-CM

## 2023-05-14 DIAGNOSIS — Z7985 Long-term (current) use of injectable non-insulin antidiabetic drugs: Secondary | ICD-10-CM

## 2023-05-14 DIAGNOSIS — J3089 Other allergic rhinitis: Secondary | ICD-10-CM

## 2023-05-14 DIAGNOSIS — I10 Essential (primary) hypertension: Secondary | ICD-10-CM

## 2023-05-14 LAB — POCT GLYCOSYLATED HEMOGLOBIN (HGB A1C): Hemoglobin A1C: 6 % — AB (ref 4.0–5.6)

## 2023-05-14 MED ORDER — ROSUVASTATIN CALCIUM 20 MG PO TABS: 20.0000 mg | ORAL_TABLET | Freq: Every day | ORAL | 1 refills | Status: DC

## 2023-05-14 MED ORDER — MONTELUKAST SODIUM 10 MG PO TABS: ORAL_TABLET | ORAL | 1 refills | Status: DC

## 2023-05-14 MED ORDER — TIRZEPATIDE 7.5 MG/0.5ML ~~LOC~~ SOAJ: 7.5000 mg | SUBCUTANEOUS | 1 refills | Status: DC

## 2023-05-14 MED ORDER — ADVAIR HFA 45-21 MCG/ACT IN AERO: 2.0000 | INHALATION_SPRAY | Freq: Two times a day (BID) | RESPIRATORY_TRACT | 1 refills | Status: DC

## 2023-05-14 MED ORDER — DULOXETINE HCL 60 MG PO CPEP: ORAL_CAPSULE | Freq: Every day | ORAL | 1 refills | Status: DC

## 2023-05-14 MED ORDER — TELMISARTAN-HCTZ 80-25 MG PO TABS: 1.0000 | ORAL_TABLET | Freq: Every day | ORAL | 1 refills | Status: DC

## 2023-05-16 NOTE — Anesthesia Preprocedure Evaluation (Signed)
Anesthesia Evaluation    History of Anesthesia Complications (+) history of anesthetic complications  Airway Mallampati: III  TM Distance: <3 FB Neck ROM: Full  Mouth opening: Limited Mouth Opening Comment: Thick neck, limited ROM due to thickness of neck Dental no notable dental hx.    Pulmonary asthma , sleep apnea , former smoker   Pulmonary exam normal breath sounds clear to auscultation       Cardiovascular hypertension, + DOE  Normal cardiovascular exam+ Valvular Problems/Murmurs  Rhythm:Regular Rate:Normal     Neuro/Psych  Headaches PSYCHIATRIC DISORDERS Anxiety Depression     Neuromuscular disease    GI/Hepatic   Endo/Other  diabetes    Renal/GU      Musculoskeletal  (+) Arthritis ,    Abdominal   Peds  Hematology   Anesthesia Other Findings Depression  Asthma HA (headache)  Vaginal dryness, menopausal Anxiety  Internal hemorrhoids Vitamin D deficiency  Double vision Obesity (BMI 30-39.9)  Allergy Dyslipidemia  OSA on CPAP Nuclear sclerotic cataract of both eyes Bladder polyp  Frequency of urination Rotator cuff tendonitis  Hx of bariatric surgery Heart murmur Hypertension Diabetes mellitus without complication (HCC) Neuropathy, generalized Carpal tunnel syndrome  Myasthenia gravis (HCC) Patient is Jehovah's Witness  Thyroid nodule H/O myasthenia gravis  Sleep apnea Obstructive sleep apnea on CPAP Glaucoma, left eye  Myasthenia gravis--mostly affects eyes Complication of anesthesia--needed nebulizer after surgery  CANNOT USE VERSED: can use precedex, and potentially use fentanyl     Reproductive/Obstetrics                             Anesthesia Physical Anesthesia Plan  ASA: 3  Anesthesia Plan: MAC   Post-op Pain Management:    Induction: Intravenous  PONV Risk Score and Plan:   Airway Management Planned: Natural Airway and Nasal Cannula  Additional  Equipment:   Intra-op Plan:   Post-operative Plan:   Informed Consent: I have reviewed the patients History and Physical, chart, labs and discussed the procedure including the risks, benefits and alternatives for the proposed anesthesia with the patient or authorized representative who has indicated his/her understanding and acceptance.     Dental Advisory Given  Plan Discussed with: Anesthesiologist, CRNA and Surgeon  Anesthesia Plan Comments: (Patient consented for risks of anesthesia including but not limited to:  - adverse reactions to medications - damage to eyes, teeth, lips or other oral mucosa - nerve damage due to positioning  - sore throat or hoarseness - Damage to heart, brain, nerves, lungs, other parts of body or loss of life  Patient voiced understanding and assent.)       Anesthesia Quick Evaluation

## 2023-05-19 NOTE — Discharge Instructions (Signed)

## 2023-05-20 ENCOUNTER — Other Ambulatory Visit: Payer: Self-pay | Admitting: Family Medicine

## 2023-05-20 ENCOUNTER — Encounter: Payer: Self-pay | Admitting: Ophthalmology

## 2023-05-20 ENCOUNTER — Other Ambulatory Visit: Payer: Self-pay

## 2023-05-20 ENCOUNTER — Ambulatory Visit
Admission: RE | Admit: 2023-05-20 | Discharge: 2023-05-20 | Disposition: A | Discharge status: Home / Self Care | Payer: Medicare Other | Attending: Ophthalmology | Admitting: Ophthalmology

## 2023-05-20 ENCOUNTER — Ambulatory Visit: Payer: Self-pay | Admitting: Anesthesiology

## 2023-05-20 ENCOUNTER — Encounter
Admission: RE | Disposition: A | Discharge status: Home / Self Care | Payer: Self-pay | Source: Home / Self Care | Attending: Ophthalmology

## 2023-05-20 ENCOUNTER — Ambulatory Visit: Payer: Medicare Other | Admitting: Anesthesiology

## 2023-05-20 DIAGNOSIS — J45909 Unspecified asthma, uncomplicated: Secondary | ICD-10-CM | POA: Diagnosis not present

## 2023-05-20 DIAGNOSIS — G4733 Obstructive sleep apnea (adult) (pediatric): Secondary | ICD-10-CM | POA: Diagnosis not present

## 2023-05-20 DIAGNOSIS — Z87891 Personal history of nicotine dependence: Secondary | ICD-10-CM | POA: Diagnosis not present

## 2023-05-20 DIAGNOSIS — H2511 Age-related nuclear cataract, right eye: Secondary | ICD-10-CM | POA: Diagnosis present

## 2023-05-20 DIAGNOSIS — Z9884 Bariatric surgery status: Secondary | ICD-10-CM | POA: Diagnosis not present

## 2023-05-20 DIAGNOSIS — G7000 Myasthenia gravis without (acute) exacerbation: Secondary | ICD-10-CM | POA: Insufficient documentation

## 2023-05-20 DIAGNOSIS — E1136 Type 2 diabetes mellitus with diabetic cataract: Secondary | ICD-10-CM | POA: Diagnosis not present

## 2023-05-20 DIAGNOSIS — E785 Hyperlipidemia, unspecified: Secondary | ICD-10-CM | POA: Diagnosis not present

## 2023-05-20 DIAGNOSIS — I10 Essential (primary) hypertension: Secondary | ICD-10-CM | POA: Insufficient documentation

## 2023-05-20 DIAGNOSIS — M5441 Lumbago with sciatica, right side: Secondary | ICD-10-CM

## 2023-05-20 HISTORY — DX: Body Mass Index (BMI) 40.0 and over, adult: Z684

## 2023-05-20 HISTORY — DX: Obstructive sleep apnea (adult) (pediatric): G47.33

## 2023-05-20 HISTORY — PX: CATARACT EXTRACTION W/PHACO: SHX586

## 2023-05-20 LAB — GLUCOSE, CAPILLARY: Glucose-Capillary: 107 mg/dL — ABNORMAL HIGH (ref 70–99)

## 2023-05-20 SURGERY — PHACOEMULSIFICATION, CATARACT, WITH IOL INSERTION
Anesthesia: Monitor Anesthesia Care | Site: Eye | Laterality: Right

## 2023-05-20 MED ORDER — BRIMONIDINE TARTRATE-TIMOLOL 0.2-0.5 % OP SOLN
OPHTHALMIC | Status: DC | PRN
  Administered 2023-05-20: 1 [drp] via OPHTHALMIC

## 2023-05-20 MED ORDER — ARMC OPHTHALMIC DILATING DROPS
1.0000 | OPHTHALMIC | Status: DC | PRN
  Administered 2023-05-20 (×3): 1 via OPHTHALMIC

## 2023-05-20 MED ORDER — FENTANYL CITRATE (PF) 100 MCG/2ML IJ SOLN
INTRAMUSCULAR | Status: AC
  Filled 2023-05-20: qty 2

## 2023-05-20 MED ORDER — ARMC OPHTHALMIC DILATING DROPS
OPHTHALMIC | Status: AC
  Filled 2023-05-20: qty 0.5

## 2023-05-20 MED ORDER — TETRACAINE HCL 0.5 % OP SOLN
1.0000 [drp] | OPHTHALMIC | Status: DC | PRN
  Administered 2023-05-20 (×3): 1 [drp] via OPHTHALMIC

## 2023-05-20 MED ORDER — MOXIFLOXACIN HCL 0.5 % OP SOLN
OPHTHALMIC | Status: DC | PRN
  Administered 2023-05-20: .2 mL via OPHTHALMIC

## 2023-05-20 MED ORDER — TETRACAINE HCL 0.5 % OP SOLN
OPHTHALMIC | Status: AC
Start: 2023-05-20 — End: ?
  Filled 2023-05-20: qty 4

## 2023-05-20 MED ORDER — SIGHTPATH DOSE#1 BSS IO SOLN
INTRAOCULAR | Status: DC | PRN
  Administered 2023-05-20: 1 mL via INTRAMUSCULAR

## 2023-05-20 MED ORDER — FENTANYL CITRATE (PF) 100 MCG/2ML IJ SOLN
INTRAMUSCULAR | Status: DC | PRN
  Administered 2023-05-20 (×3): 25 ug via INTRAVENOUS

## 2023-05-20 MED ORDER — DEXMEDETOMIDINE HCL IN NACL 200 MCG/50ML IV SOLN
INTRAVENOUS | Status: DC | PRN
  Administered 2023-05-20: 10 ug via INTRAVENOUS

## 2023-05-20 MED ORDER — SIGHTPATH DOSE#1 BSS IO SOLN
INTRAOCULAR | Status: DC | PRN
  Administered 2023-05-20: 55 mL via OPHTHALMIC

## 2023-05-20 MED ORDER — DEXMEDETOMIDINE HCL IN NACL 80 MCG/20ML IV SOLN
INTRAVENOUS | Status: AC
Start: 2023-05-20 — End: ?
  Filled 2023-05-20: qty 20

## 2023-05-20 MED ORDER — SIGHTPATH DOSE#1 BSS IO SOLN
INTRAOCULAR | Status: DC | PRN
  Administered 2023-05-20: 15 mL

## 2023-05-20 MED ORDER — SIGHTPATH DOSE#1 NA CHONDROIT SULF-NA HYALURON 40-17 MG/ML IO SOLN
INTRAOCULAR | Status: DC | PRN
  Administered 2023-05-20: 1 mL via INTRAOCULAR

## 2023-05-20 SURGICAL SUPPLY — 10 items
ANGLE REVERSE CUT SHRT 25GA (CUTTER) ×1
CATARACT SUITE SIGHTPATH (MISCELLANEOUS) ×1
CYSTOTOME ANGL RVRS SHRT 25G (CUTTER) ×1 IMPLANT
FEE CATARACT SUITE SIGHTPATH (MISCELLANEOUS) ×1 IMPLANT
GLOVE BIOGEL PI IND STRL 8 (GLOVE) ×1 IMPLANT
GLOVE SURG LX STRL 8.0 MICRO (GLOVE) ×1 IMPLANT
LENS IOL TECNIS EYHANCE 20.0 (Intraocular Lens) IMPLANT
NDL FILTER BLUNT 18X1 1/2 (NEEDLE) ×1 IMPLANT
NEEDLE FILTER BLUNT 18X1 1/2 (NEEDLE) ×1
SYR 3ML LL SCALE MARK (SYRINGE) ×1 IMPLANT

## 2023-05-20 NOTE — Op Note (Signed)
PREOPERATIVE DIAGNOSIS:  Nuclear sclerotic cataract of the right eye.   POSTOPERATIVE DIAGNOSIS:  H25.11 Cataract   OPERATIVE PROCEDURE:ORPROCALL@   SURGEON:  Galen Manila, MD.   ANESTHESIA:  Anesthesiologist: Marisue Humble, MD CRNA: Domenic Moras, CRNA  1.      Managed anesthesia care. 2.      0.45ml of Shugarcaine was instilled in the eye following the paracentesis.   COMPLICATIONS:  None.   TECHNIQUE:   Stop and chop   DESCRIPTION OF PROCEDURE:  The patient was examined and consented in the preoperative holding area where the aforementioned topical anesthesia was applied to the right eye and then brought back to the Operating Room where the right eye was prepped and draped in the usual sterile ophthalmic fashion and a lid speculum was placed. A paracentesis was created with the side port blade and the anterior chamber was filled with viscoelastic. A near clear corneal incision was performed with the steel keratome. A continuous curvilinear capsulorrhexis was performed with a cystotome followed by the capsulorrhexis forceps. Hydrodissection and hydrodelineation were carried out with BSS on a blunt cannula. The lens was removed in a stop and chop  technique and the remaining cortical material was removed with the irrigation-aspiration handpiece. The capsular bag was inflated with viscoelastic and the Technis [id number]  lens was placed in the capsular bag without complication. The remaining viscoelastic was removed from the eye with the irrigation-aspiration handpiece. The wounds were hydrated. The anterior chamber was flushed with BSS and the eye was inflated to physiologic pressure. 0.67ml of Vigamox was placed in the anterior chamber. The wounds were found to be water tight. The eye was dressed with Combigan. The patient was given protective glasses to wear throughout the day and a shield with which to sleep tonight. The patient was also given drops with which to begin a drop regimen today  and will follow-up with me in one day. Implant Name Type Inv. Item Serial No. Manufacturer Lot No. LRB No. Used Action  LENS IOL TECNIS EYHANCE 20.0 - [id number] Intraocular Lens LENS IOL TECNIS EYHANCE 20.0 [id number] [employer]  Right 1 Implanted   Procedure(s): CATARACT EXTRACTION PHACO AND INTRAOCULAR LENS PLACEMENT (IOC) RIGHT DIABETIC  7.35  00:46.2 (Right)  Electronically signed: Galen Manila 05/20/2023 7:50 AM

## 2023-05-20 NOTE — Transfer of Care (Signed)
Immediate Anesthesia Transfer of Care Note  Patient: Jasmine Woods  Procedure(s) Performed: CATARACT EXTRACTION PHACO AND INTRAOCULAR LENS PLACEMENT (IOC) RIGHT DIABETIC  7.35  00:46.2 (Right: Eye)  Patient Location: PACU  Anesthesia Type: MAC  Level of Consciousness: awake, alert  and patient cooperative  Airway and Oxygen Therapy: Patient Spontanous Breathing and Patient connected to supplemental oxygen  Post-op Assessment: Post-op Vital signs reviewed, Patient's Cardiovascular Status Stable, Respiratory Function Stable, Patent Airway and No signs of Nausea or vomiting  Post-op Vital Signs: Reviewed and stable  Complications: No notable events documented.

## 2023-05-20 NOTE — H&P (Signed)
[facility]   Primary Care Physician:  Alba Cory, MD Ophthalmologist: Dr. Druscilla Brownie  Pre-Procedure History & Physical: HPI:  Niajah Mauel is a 67 y.o. female here for cataract surgery.   Past Medical History:  Diagnosis Date   Allergy    Anxiety    Asthma    Bladder polyp    Dr. Orson Slick   Carpal tunnel syndrome    Complication of anesthesia    needed nebulizer after surgery   Depression    Diabetes mellitus without complication (HCC)    Diet controlled   Double vision    seen by Dr. 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   Dyslipidemia    Frequency of urination    Glaucoma, left eye    H/O myasthenia gravis    MOSTLY AFFECTS EYES   HA (headache)    Heart murmur    followed by PCP   Hx of bariatric surgery    Hypertension    no meds   Internal hemorrhoids    Myasthenia gravis (HCC)    "mostly effects eyes"   Myasthenia gravis (HCC)    Neuropathy, generalized    diabetic on feet   Nuclear sclerotic cataract of both eyes    Obesity (BMI 30-39.9)    Obstructive sleep apnea on CPAP    uses cpap   Patient is Jehovah's Witness    no blood products   Rotator cuff tendonitis 07/2009   also has impingment right shoulder pain, seen by Dr. Martha Clan   Sleep apnea    Thyroid nodule    Vaginal dryness, menopausal    Vitamin D deficiency     Past Surgical History:  Procedure Laterality Date   ABDOMINAL HYSTERECTOMY  1991   BARIATRIC SURGERY  2014   CARPAL TUNNEL RELEASE Left 06/08/2019   Procedure: LEFT CARPAL TUNNEL RELEASE;  Surgeon: Kathryne Hitch, MD;  Location: [facility];  Service: Orthopedics;  Laterality: Left;   CARPAL TUNNEL RELEASE Right 08/19/2019   Procedure: RIGHT CARPAL TUNNEL RELEASE;  Surgeon: Kathryne Hitch, MD;  Location: [facility];  Service: Orthopedics;  Laterality: Right;   CATARACT EXTRACTION W/PHACO Left 01/28/2017   Procedure: CATARACT EXTRACTION PHACO AND INTRAOCULAR  LENS PLACEMENT (IOC);  Surgeon: Galen Manila, MD;  Location: [facility];  Service: Ophthalmology;  Laterality: Left;  [place] 00:37 AP% 17.8 CDE 6.61 Fluid pack lot # [id number]   COLONOSCOPY  2012   COLONOSCOPY WITH PROPOFOL N/A 09/30/2016   Procedure: COLONOSCOPY WITH PROPOFOL;  Surgeon: Midge Minium, MD;  Location: [facility];  Service: Endoscopy;  Laterality: N/A;  diabetic - diet controlled sleep apnea   KNEE SURGERY Right 1979-1980   OOPHORECTOMY     PARS PLANA VITRECTOMY Left 03/27/2016   Procedure: PARS PLANA VITRECTOMY WITH 25 GAUGE AND MEMBRANE PEEL, INTERNAL LIMITING MEMBRANE;  Surgeon: Edmon Crape, MD;  Location: [facility];  Service: Ophthalmology;  Laterality: Left;   THYROID SURGERY     BX done was benign   UPPER GI ENDOSCOPY     VITRECTOMY Left 04/27/2016    Prior to Admission medications   Medication Sig Start Date End Date Taking? Authorizing Provider  acetaminophen (TYLENOL) 500 MG tablet Take 1,000 mg by mouth every 6 (six) hours as needed for mild pain. Reported on 07/06/2015   Yes [provider]  albuterol (VENTOLIN HFA) 108 (90 Base) MCG/ACT inhaler INHALE 2 PUFFS BY MOUTH EVERY 4 HOURS AS NEEDED FOR WHEEZING OR SHORTNESS OF BREATH. 09/13/22 09/13/23  Yes Sowles, Danna Hefty, MD  budesonide (RHINOCORT AQUA) 32 MCG/ACT nasal spray Place 2 sprays into both nostrils at bedtime. 08/04/19  Yes Sowles, Danna Hefty, MD  cholecalciferol (VITAMIN D3) 25 MCG (1000 UNIT) tablet Take 1,000 Units by mouth daily.   Yes [provider]  Coenzyme Q10 (CO Q 10) 100 MG CAPS Take 1 capsule by mouth daily. 09/05/16  Yes Sowles, Danna Hefty, MD  cyanocobalamin (VITAMIN B12) 500 MCG tablet Take 500 mcg by mouth 3 (three) times a week.   Yes [provider]  DULoxetine (CYMBALTA) 60 MG capsule TAKE 1 CAPSULE (60 MG TOTAL) BY MOUTH DAILY. 05/14/23 05/13/24 Yes Sowles, Danna Hefty, MD  fluticasone-salmeterol (ADVAIR HFA) [phone] MCG/ACT inhaler Inhale 2 puffs into the lungs 2 (two)  times daily. 05/14/23  Yes Sowles, Danna Hefty, MD  glucose blood (FREESTYLE LITE) test strip 100 each by Other route daily. Use one strip daily for blood glucose testing 01/08/23  Yes Sowles, Danna Hefty, MD  ibuprofen (ADVIL) 600 MG tablet Take 1 tablet (600 mg total) by mouth every 8 (eight) hours as needed. 04/28/23  Yes Danelle Berry, PA-C  Lancets (FREESTYLE) lancets Use one lancet daily for blood glucose testing 01/08/23  Yes Sowles, Danna Hefty, MD  montelukast (SINGULAIR) 10 MG tablet TAKE 1 TABLET BY MOUTH EVERYDAY AT BEDTIME 05/14/23  Yes Sowles, Danna Hefty, MD  Multiple Vitamins-Minerals (MULTIVITAMIN WITH MINERALS) tablet Take 1 tablet by mouth daily.   Yes [provider]  Omega-3 Fatty Acids (FISH OIL) 1200 MG CAPS Take 2 capsules by mouth daily.   Yes [provider]  pantoprazole (PROTONIX) 40 MG tablet Take 1 tablet (40 mg total) by mouth daily. 04/28/23  Yes Danelle Berry, PA-C  pyridostigmine (MESTINON) 60 MG tablet TAKE 1 TABLET AT 8 AM, NOON, AND AT 4 PM. OK TO TAKE EXTRA DOSE AS NEEDED. 04/15/23  Yes Patel, Donika K, DO  rosuvastatin (CRESTOR) 20 MG tablet Take 1 tablet (20 mg total) by mouth daily. 05/14/23  Yes Sowles, Danna Hefty, MD  telmisartan-hydrochlorothiazide (MICARDIS HCT) 80-25 MG tablet Take 1 tablet by mouth daily. 05/14/23 05/13/24 Yes Sowles, Danna Hefty, MD  tiZANidine (ZANAFLEX) 4 MG tablet Take 0.5-1.5 tablets (2-6 mg total) by mouth every 8 (eight) hours as needed for muscle spasms (muscle tightness). 04/28/23  Yes Danelle Berry, PA-C  tirzepatide [facility]) 7.5 MG/0.5ML Pen Inject 7.5 mg into the skin once a week. 05/14/23   Alba Cory, MD    Allergies as of 04/21/2023 - Review Complete 04/15/2023  Allergen Reaction Noted   Aspirin Swelling 03/18/2017   Azithromycin  06/11/2017   Ace inhibitors  04/11/2015   Amlodipine  04/11/2015   Gabapentin  04/11/2015   Pregabalin Other (See Comments) 04/11/2015    Family History  Problem Relation Age of Onset    Diabetes Mother    Hypertension Mother    Heart disease Mother 85       stent x 1    Migraines Sister    Stroke Maternal Grandfather    Diabetes Paternal Grandfather    Muscular dystrophy Sister    Heart attack Maternal Aunt    Breast cancer Neg Hx    Thyroid disease Neg Hx     Social History   Socioeconomic History   Marital status: Widowed    Spouse name: Adella Nissen"   Number of children: 2   Years of education: 12   Highest education level: 12th grade  Occupational History   Occupation: [employer]  Tobacco Use   Smoking status: Former  Current packs/day: 0.00    Average packs/day: 0.5 packs/day for 3.0 years (1.5 ttl pk-yrs)    Types: Cigarettes    Start date: 06/24/1972    Quit date: 06/25/1975    Years since quitting: 47.9   Smokeless tobacco: Never   Tobacco comments:    smoked as teenager  Vaping Use   Vaping status: Never Used  Substance and Sexual Activity   Alcohol use: No    Alcohol/week: 0.0 standard drinks of alcohol   Drug use: No   Sexual activity: Not Currently    Partners: Male  Other Topics Concern   Not on file  Social History Narrative   Right handed   One story home   12 years education   Retired from [employer]         Husband passed away    Social Determinants of Health   Financial Resource Strain: Low Risk  (04/27/2023)   Overall Financial Resource Strain (CARDIA)    Difficulty of Paying Living Expenses: Not hard at all  Food Insecurity: No Food Insecurity (04/27/2023)   Hunger Vital Sign    Worried About Running Out of Food in the Last Year: Never true    Ran Out of Food in the Last Year: Never true  Transportation Needs: No Transportation Needs (04/27/2023)   PRAPARE - Administrator, Civil Service (Medical): No    Lack of Transportation (Non-Medical): No  Physical Activity: Inactive (04/27/2023)   Exercise Vital Sign    Days of Exercise per Week: 0 days    Minutes of Exercise per Session: 0 min  Stress: No  Stress Concern Present (04/27/2023)   [employer] of Occupational Health - Occupational Stress Questionnaire    Feeling of Stress : Not at all  Social Connections: Moderately Integrated (04/27/2023)   Social Connection and Isolation Panel [NHANES]    Frequency of Communication with Friends and Family: More than three times a week    Frequency of Social Gatherings with Friends and Family: More than three times a week    Attends Religious Services: More than 4 times per year    Active Member of [employer] or Organizations: Yes    Attends Banker Meetings: More than 4 times per year    Marital Status: Widowed  Intimate Partner Violence: Not At Risk (08/22/2022)   Humiliation, Afraid, Rape, and Kick questionnaire    Fear of Current or Ex-Partner: No    Emotionally Abused: No    Physically Abused: No    Sexually Abused: No    Review of Systems: See HPI, otherwise negative ROS  Physical Exam: BP (!) 156/80   Pulse (!) 56   Temp 98.1 F (36.7 C) (Temporal)   Resp 11   Ht 5\' 2"  (1.575 m)   Wt 113.4 kg   SpO2 97%   BMI 45.73 kg/m  General:   Alert, cooperative in NAD Head:  Normocephalic and atraumatic. Respiratory:  Normal work of breathing. Cardiovascular:  RRR  Impression/Plan: Tabitha Speiser is here for cataract surgery.  Risks, benefits, limitations, and alternatives regarding cataract surgery have been reviewed with the patient.  Questions have been answered.  All parties agreeable.   Galen Manila, MD  05/20/2023, 7:14 AM

## 2023-05-20 NOTE — Anesthesia Postprocedure Evaluation (Signed)
Anesthesia Post Note  Patient: Betzaida Ahlstrand  Procedure(s) Performed: CATARACT EXTRACTION PHACO AND INTRAOCULAR LENS PLACEMENT (IOC) RIGHT DIABETIC  7.35  00:46.2 (Right: Eye)  Patient location during evaluation: PACU Anesthesia Type: MAC Level of consciousness: awake and alert Pain management: pain level controlled Vital Signs Assessment: post-procedure vital signs reviewed and stable Respiratory status: spontaneous breathing, nonlabored ventilation, respiratory function stable and patient connected to nasal cannula oxygen Cardiovascular status: stable and blood pressure returned to baseline Postop Assessment: no apparent nausea or vomiting Anesthetic complications: no   No notable events documented.   Last Vitals:  Vitals:   05/20/23 0751 05/20/23 0756  BP: (!) 151/70 (!) 151/70  Pulse: (!) 55 (!) 53  Resp: 18 20  Temp: (!) 36.4 C 36.6 C  SpO2: 100% 100%    Last Pain:  Vitals:   05/20/23 0756  TempSrc:   PainSc: 0-No pain                 Larz Mark C Dorris Pierre

## 2023-05-21 ENCOUNTER — Encounter: Payer: Self-pay | Admitting: Ophthalmology

## 2023-05-21 ENCOUNTER — Encounter: Payer: Self-pay | Admitting: Family Medicine

## 2023-05-26 ENCOUNTER — Telehealth: Payer: Self-pay | Admitting: Neurosurgery

## 2023-05-26 NOTE — Progress Notes (Addendum)
Referring Physician:  No referring provider defined for this encounter.  Primary Physician:  Alba Cory, MD  History of Present Illness: 05/28/2023 Ms. Jasmine Woods is here today with a chief complaint of low back pain radiating to bilateral lower extremities x 3 months.  On her left side primarily travels from her butt and into her left hip and groin.  She states after needing to alternate her gait secondary to pain, her right side started hurting.  She has been given 2 doses of steroids and it helped only some and has been using a muscle relaxer for pain but states it is severe.  She also adds that she has been having numbness and tingling in her feet and feels as though she has weakness in her hips.  Her pain is aggravated by standing walking bending and squatting.  She denies any saddle anesthesia or episodes of incontinence.  She is currently precipitating with physical therapy.   Review of Systems:  A 10 point review of systems is negative, except for the pertinent positives and negatives detailed in the HPI.  Past Medical History: Past Medical History:  Diagnosis Date   Allergy    Anxiety    Asthma    Bladder polyp    Dr. Orson Slick   BMI 45.0-49.9, adult [facility])    Carpal tunnel syndrome    Complication of anesthesia    needed nebulizer after surgery   Depression    Diabetes mellitus without complication (HCC)    Diet controlled   Double vision    seen by Dr. 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   Dyslipidemia    Frequency of urination    Glaucoma, left eye    H/O myasthenia gravis    MOSTLY AFFECTS EYES   HA (headache)    Heart murmur    followed by PCP   Hx of bariatric surgery    Hypertension    no meds   Internal hemorrhoids    Myasthenia gravis (HCC)    "mostly effects eyes"   Myasthenia gravis (HCC)    Neuropathy, generalized    diabetic on feet   Nuclear sclerotic cataract of both eyes    Obesity (BMI 30-39.9)    Obstructive sleep apnea on  CPAP    uses cpap   OSA on CPAP    Patient is Jehovah's Witness    no blood products   Rotator cuff tendonitis 07/2009   also has impingment right shoulder pain, seen by Dr. Martha Clan   Sleep apnea    Thyroid nodule    Vaginal dryness, menopausal    Vitamin D deficiency     Past Surgical History: Past Surgical History:  Procedure Laterality Date   ABDOMINAL HYSTERECTOMY  1991   BARIATRIC SURGERY  2014   CARPAL TUNNEL RELEASE Left 06/08/2019   Procedure: LEFT CARPAL TUNNEL RELEASE;  Surgeon: Kathryne Hitch, MD;  Location: [facility];  Service: Orthopedics;  Laterality: Left;   CARPAL TUNNEL RELEASE Right 08/19/2019   Procedure: RIGHT CARPAL TUNNEL RELEASE;  Surgeon: Kathryne Hitch, MD;  Location:  SURGERY CENTER;  Service: Orthopedics;  Laterality: Right;   CATARACT EXTRACTION W/PHACO Left 01/28/2017   Procedure: CATARACT EXTRACTION PHACO AND INTRAOCULAR LENS PLACEMENT (IOC);  Surgeon: Galen Manila, MD;  Location: [facility];  Service: Ophthalmology;  Laterality: Left;  [place] 00:37 AP% 17.8 CDE 6.61 Fluid pack lot # [id number]   CATARACT EXTRACTION W/PHACO Right 05/20/2023   Procedure: CATARACT EXTRACTION PHACO AND INTRAOCULAR LENS PLACEMENT (  IOC) RIGHT DIABETIC  7.35  00:46.2;  Surgeon: Galen Manila, MD;  Location: [facility];  Service: Ophthalmology;  Laterality: Right;   COLONOSCOPY  2012   COLONOSCOPY WITH PROPOFOL N/A 09/30/2016   Procedure: COLONOSCOPY WITH PROPOFOL;  Surgeon: Midge Minium, MD;  Location: [facility];  Service: Endoscopy;  Laterality: N/A;  diabetic - diet controlled sleep apnea   KNEE SURGERY Right 1979-1980   OOPHORECTOMY     PARS PLANA VITRECTOMY Left 03/27/2016   Procedure: PARS PLANA VITRECTOMY WITH 25 GAUGE AND MEMBRANE PEEL, INTERNAL LIMITING MEMBRANE;  Surgeon: Edmon Crape, MD;  Location: [facility];  Service: Ophthalmology;  Laterality: Left;   THYROID SURGERY     BX done was benign    UPPER GI ENDOSCOPY     VITRECTOMY Left 04/27/2016    Allergies: Allergies as of 05/28/2023 - Review Complete 05/28/2023  Allergen Reaction Noted   Aspirin Swelling 03/18/2017   Azithromycin  06/11/2017   Ace inhibitors  04/11/2015   Amlodipine  04/11/2015   Gabapentin  04/11/2015   Pregabalin Other (See Comments) 04/11/2015    Medications: Outpatient Encounter Medications as of 05/28/2023  Medication Sig   oxyCODONE (ROXICODONE) 5 MG immediate release tablet Take 0.5-1 tablets (2.5-5 mg total) by mouth every 6 (six) hours as needed for severe pain (pain score 7-10).   acetaminophen (TYLENOL) 500 MG tablet Take 1,000 mg by mouth every 6 (six) hours as needed for mild pain. Reported on 07/06/2015   albuterol (VENTOLIN HFA) 108 (90 Base) MCG/ACT inhaler INHALE 2 PUFFS BY MOUTH EVERY 4 HOURS AS NEEDED FOR WHEEZING OR SHORTNESS OF BREATH.   budesonide (RHINOCORT AQUA) 32 MCG/ACT nasal spray Place 2 sprays into both nostrils at bedtime.   cholecalciferol (VITAMIN D3) 25 MCG (1000 UNIT) tablet Take 1,000 Units by mouth daily.   Coenzyme Q10 (CO Q 10) 100 MG CAPS Take 1 capsule by mouth daily.   cyanocobalamin (VITAMIN B12) 500 MCG tablet Take 500 mcg by mouth 3 (three) times a week.   DULoxetine (CYMBALTA) 60 MG capsule TAKE 1 CAPSULE (60 MG TOTAL) BY MOUTH DAILY.   fluticasone-salmeterol (ADVAIR HFA) 45-21 MCG/ACT inhaler Inhale 2 puffs into the lungs 2 (two) times daily.   glucose blood (FREESTYLE LITE) test strip 100 each by Other route daily. Use one strip daily for blood glucose testing   ibuprofen (ADVIL) 600 MG tablet Take 1 tablet (600 mg total) by mouth every 8 (eight) hours as needed.   Lancets (FREESTYLE) lancets Use one lancet daily for blood glucose testing   montelukast (SINGULAIR) 10 MG tablet TAKE 1 TABLET BY MOUTH EVERYDAY AT BEDTIME   Multiple Vitamins-Minerals (MULTIVITAMIN WITH MINERALS) tablet Take 1 tablet by mouth daily.   Omega-3 Fatty Acids (FISH OIL) 1200 MG CAPS Take  2 capsules by mouth daily.   pantoprazole (PROTONIX) 40 MG tablet TAKE 1 TABLET BY MOUTH EVERY DAY   pyridostigmine (MESTINON) 60 MG tablet TAKE 1 TABLET AT 8 AM, NOON, AND AT 4 PM. OK TO TAKE EXTRA DOSE AS NEEDED.   rosuvastatin (CRESTOR) 20 MG tablet Take 1 tablet (20 mg total) by mouth daily.   telmisartan-hydrochlorothiazide (MICARDIS HCT) 80-25 MG tablet Take 1 tablet by mouth daily.   tirzepatide (MOUNJARO) 7.5 MG/0.5ML Pen Inject 7.5 mg into the skin once a week.   tiZANidine (ZANAFLEX) 4 MG tablet Take 0.5-1.5 tablets (2-6 mg total) by mouth every 8 (eight) hours as needed for muscle spasms (muscle tightness).   No facility-administered encounter medications on  file as of 05/28/2023.    Social History: Social History   Tobacco Use   Smoking status: Former    Current packs/day: 0.00    Average packs/day: 0.5 packs/day for 3.0 years (1.5 ttl pk-yrs)    Types: Cigarettes    Start date: 06/24/1972    Quit date: 06/25/1975    Years since quitting: 47.9   Smokeless tobacco: Never   Tobacco comments:    smoked as teenager  Vaping Use   Vaping status: Never Used  Substance Use Topics   Alcohol use: No    Alcohol/week: 0.0 standard drinks of alcohol   Drug use: No    Family Medical History: Family History  Problem Relation Age of Onset   Diabetes Mother    Hypertension Mother    Heart disease Mother 53       stent x 1    Migraines Sister    Stroke Maternal Grandfather    Diabetes Paternal Grandfather    Muscular dystrophy Sister    Heart attack Maternal Aunt    Breast cancer Neg Hx    Thyroid disease Neg Hx     Physical Examination: @VITALWITHPAIN @  General: Patient is well developed, well nourished, calm, collected, and in no apparent distress. Attention to examination is appropriate.  Psychiatric: Patient is non-anxious.  Head:  Pupils equal, round, and reactive to light.  ENT:  Oral mucosa appears well hydrated.   Respiratory: Patient is breathing without any  difficulty.  Extremities: No edema.  Vascular: Palpable dorsal pedal pulses.  Skin:   On exposed skin, there are no abnormal skin lesions.  NEUROLOGICAL:     Awake, alert, oriented to person, place, and time.  Speech is clear and fluent. Fund of knowledge is appropriate.   Cranial Nerves: Pupils equal round and reactive to light.  Facial tone is symmetric.  Facial  ROM of spine: Patient has tenderness to palpation to the lumbar spine.  Strength:  Side Iliopsoas Quads Hamstring PF DF EHL  R 5 5 5 5 5 5   L 5 5 5 5 5 5    Significant pain with hip internal and external rotation.   Reflexes are 1+ in patellar and Achilles achilles.    Clonus is not present.   Bilateral upper and lower extremity sensation is intact to light touch.    Gait is normal.     Medical Decision Making  Imaging:  MRI LUMBAR SPINE: Disc levels:   T12-L1 through L2-L3: No significant disc protrusion, foraminal stenosis, or canal stenosis.   L3-L4: Mild annular disc bulge. Mild bilateral facet arthropathy with ligamentum flavum buckling. No canal stenosis. No significant foraminal stenosis.   L4-L5: Anterolisthesis with disc uncovering and diffuse disc bulge. Mild-moderate bilateral facet arthropathy. No canal stenosis. Mild left foraminal stenosis.   L5-S1: No disc protrusion. Mild facet hypertrophy. No foraminal or canal stenosis.   IMPRESSION: 1. Mild degenerative changes of the lumbar spine, most pronounced at L4-L5 where there is mild left foraminal stenosis. 2. No canal stenosis at any level. I have personally reviewed the images and agree with the above interpretation.  Assessment and Plan: Jasmine Woods is a pleasant 67 y.o. female is here today with a chief complaint of low back pain radiating to bilateral lower extremities x 3 months.  On her left side primarily travels from her butt and into her left hip and groin.  She states after needing to alternate her gait secondary to pain,  her right side started hurting.  She  has been given 2 doses of steroids and it helped only some and has been using a muscle relaxer for pain but states it is severe.  She also adds that she has been having numbness and tingling in her feet and feels as though she has weakness in her hips.  Her pain is aggravated by standing walking bending and squatting.  She denies any saddle anesthesia or episodes of incontinence.  She is currently precipitating with physical therapy.  Patient does have significant pain with hip internal and external rotation as well as tenderness palpation of her lumbar spine.  MRI reviewed showing mild degenerative changes of the lumbar spine most pronounced at L4-5 where there is mild left foraminal stenosis.  No central canal stenosis noted.  PLAN: -Continue physical therapy -For acute severe pain currently, a short dose of oxycodone was given.  This will not be refilled and patient was counseled on usage.  Should be a bridge while patient makes to get into see our pain providers -Referral sent to our pain team for both potential ESI of lumbar spine as well as left hip injection.  Concern for hip otology due to mild arthritis seen on x-ray with radiation to the groin and pain with external and internal rotation of the hip. -Due to numbness and tingling, would like patient to undergo EMG of bilateral lower extremities -Would consider anti-inflammatory medication in the future however patient does not have recent labs and with current state of health, would like BMP for further evaluation of kidneys prior to starting any additional medication due to long term NSAID use related to chronic pain and lumbar radiculopathy.  Thank you for involving me in the care of this patient.   Joan Flores, PA-C Dept. of Neurosurgery

## 2023-05-26 NOTE — Telephone Encounter (Signed)
I gave you the notes for review. They have been scanned. Can I move forward in scheduling her with the PA?

## 2023-05-26 NOTE — Telephone Encounter (Signed)
She confirmed appt for 12/4 at 1pm because she has another morning appt.

## 2023-05-26 NOTE — Telephone Encounter (Signed)
She will work on getting the notes sent to the office.

## 2023-05-26 NOTE — Telephone Encounter (Signed)
Will you be able to get notes from [facility] quickly? If so, can put in follow up slot with me or Brooke this week.

## 2023-05-26 NOTE — Telephone Encounter (Signed)
MRI lumbar 05/12/2023 IMPRESSION: 1. Mild degenerative changes of the lumbar spine, most pronounced at L4-L5 where there is mild left foraminal stenosis. 2. No canal stenosis at any level.  Patient just saw Altamese Cabal at [facility], who told her to call Dr. Myer Haff for an appt ASAP. First PT visit at [facility] on 05/13/23 and her next PT appt this Wednesday. She could barely finish the appt due to the pain. No injections. She has severe pain that increases with sitting and walking. She has noticed that she gets the urgency to urinate but is not able to empty her bladder. She feels this is related to her back. She wants to see Dr.Yarbrough ASAP.

## 2023-05-28 ENCOUNTER — Encounter: Payer: Self-pay | Admitting: Physician Assistant

## 2023-05-28 ENCOUNTER — Telehealth: Payer: Self-pay | Admitting: Physician Assistant

## 2023-05-28 ENCOUNTER — Ambulatory Visit: Payer: Medicare Other | Admitting: Physician Assistant

## 2023-05-28 ENCOUNTER — Other Ambulatory Visit
Admission: RE | Admit: 2023-05-28 | Discharge: 2023-05-28 | Disposition: A | Discharge status: Home / Self Care | Payer: Medicare Other | Source: Ambulatory Visit | Attending: Physician Assistant | Admitting: Physician Assistant

## 2023-05-28 VITALS — BP 138/100 | Ht 62.0 in | Wt 248.8 lb

## 2023-05-28 DIAGNOSIS — M25552 Pain in left hip: Secondary | ICD-10-CM

## 2023-05-28 DIAGNOSIS — M5416 Radiculopathy, lumbar region: Secondary | ICD-10-CM | POA: Insufficient documentation

## 2023-05-28 DIAGNOSIS — M545 Low back pain, unspecified: Secondary | ICD-10-CM

## 2023-05-28 DIAGNOSIS — Z791 Long term (current) use of non-steroidal anti-inflammatories (NSAID): Secondary | ICD-10-CM

## 2023-05-28 DIAGNOSIS — M51362 Other intervertebral disc degeneration, lumbar region with discogenic back pain and lower extremity pain: Secondary | ICD-10-CM

## 2023-05-28 LAB — BASIC METABOLIC PANEL
Anion gap: 11 (ref 5–15)
BUN: 20 mg/dL (ref 8–23)
CO2: 25 mmol/L (ref 22–32)
Calcium: 9.3 mg/dL (ref 8.9–10.3)
Chloride: 102 mmol/L (ref 98–111)
Creatinine, Ser: 0.72 mg/dL (ref 0.44–1.00)
GFR, Estimated: >60 mL/min (ref >60)
Glucose, Bld: 94 mg/dL (ref 70–99)
Potassium: 3.3 mmol/L — ABNORMAL LOW (ref 3.5–5.1)
Sodium: 138 mmol/L (ref 135–145)

## 2023-05-28 MED ORDER — OXYCODONE HCL 5 MG PO TABS: 2.5000 mg | ORAL_TABLET | Freq: Four times a day (QID) | ORAL | 0 refills | Status: DC | PRN

## 2023-05-28 NOTE — Telephone Encounter (Signed)
Patient states she was seen today and the prescription that was sent for O[facility]CODONE [facility]   [facility] [address] Please change the order

## 2023-05-28 NOTE — Telephone Encounter (Signed)
[facility] called [facility] and left a voicemail to cancel oxycodone rx.

## 2023-05-29 ENCOUNTER — Other Ambulatory Visit: Payer: Self-pay

## 2023-05-29 ENCOUNTER — Other Ambulatory Visit: Payer: Self-pay | Admitting: Physician Assistant

## 2023-05-29 DIAGNOSIS — R202 Paresthesia of skin: Secondary | ICD-10-CM

## 2023-05-29 DIAGNOSIS — R200 Anesthesia of skin: Secondary | ICD-10-CM

## 2023-05-29 MED ORDER — OXYCODONE HCL 5 MG PO TABS: 2.5000 mg | ORAL_TABLET | Freq: Four times a day (QID) | ORAL | 0 refills | Status: DC | PRN

## 2023-05-29 NOTE — Telephone Encounter (Signed)
Patient notified RX was sent to pharmacy. 

## 2023-05-29 NOTE — Telephone Encounter (Signed)
  Media Information   Document Information  I spoke to Jasmine Woods, they were still not able to fill her pain medication.

## 2023-05-29 NOTE — Telephone Encounter (Signed)
Brooke, [facility] [address] [place] as requested yesterday. Thank you

## 2023-06-04 DIAGNOSIS — M25552 Pain in left hip: Secondary | ICD-10-CM

## 2023-06-04 DIAGNOSIS — M5416 Radiculopathy, lumbar region: Secondary | ICD-10-CM

## 2023-06-17 DIAGNOSIS — M1612 Unilateral primary osteoarthritis, left hip: Secondary | ICD-10-CM | POA: Insufficient documentation

## 2023-06-30 ENCOUNTER — Encounter: Payer: Self-pay | Admitting: Neurology

## 2023-07-02 ENCOUNTER — Other Ambulatory Visit: Payer: Self-pay | Admitting: Family Medicine

## 2023-07-02 DIAGNOSIS — J4530 Mild persistent asthma, uncomplicated: Secondary | ICD-10-CM

## 2023-07-02 MED ORDER — FLUTICASONE-SALMETEROL 45-21 MCG/ACT IN AERO: 2.0000 | INHALATION_SPRAY | Freq: Two times a day (BID) | RESPIRATORY_TRACT | 2 refills | Status: DC

## 2023-07-04 ENCOUNTER — Ambulatory Visit (INDEPENDENT_AMBULATORY_CARE_PROVIDER_SITE_OTHER): Payer: Medicare Other | Admitting: Neurology

## 2023-07-04 DIAGNOSIS — R202 Paresthesia of skin: Secondary | ICD-10-CM

## 2023-07-04 NOTE — Procedures (Signed)
 [facility]  [address] [place], [address]  [place], [place] [address] Tel: [phone] Fax: [phone] Test Date:  07/04/2023  Patient: Jasmine Woods DOB: 1955-11-19 Physician: Tonita Blanch, DO  Sex: Female Height: 5' 2 Ref Phys: Lyle Decamp, DEVONNA  ID#: [id number]   Technician:    History: This is a 68 year old female referred for evaluation of bilateral leg pain.  NCV & EMG Findings: Electrodiagnostic testing of the right lower extremity and additional studies of the left shows: Bilateral sural and superficial peroneal sensory responses are within normal limits. Bilateral peroneal and tibial motor responses are within normal limits. Bilateral tibial H reflex studies are within normal limits. There is no evidence of active or chronic motor axonal changes affecting any of the tested muscles.  Motor unit configuration and recruitment pattern is within normal limits.  Impression:  This is a normal study of the lower extremities.  In particular, there is no evidence of a large fiber sensorimotor polyneuropathy or lumbosacral radiculopathy.    ___________________________ Tonita Blanch, DO    Nerve Conduction Studies   Stim Site NR Peak (ms) Norm Peak (ms) O-P Amp (V) Norm O-P Amp  Left Sup Peroneal Anti Sensory (Ant Lat Mall)  32 C  12 cm    2.3 <4.6 3.9 >3  Right Sup Peroneal Anti Sensory (Ant Lat Mall)  32 C  12 cm    2.8 <4.6 5.1 >3  Left Sural Anti Sensory (Lat Mall)  32 C  Calf    2.8 <4.6 8.6 >3  Right Sural Anti Sensory (Lat Mall)  32 C  Calf    2.8 <4.6 6.3 >3     Stim Site NR Onset (ms) Norm Onset (ms) O-P Amp (mV) Norm O-P Amp Site1 Site2 Delta-0 (ms) Dist (cm) Vel (m/s) Norm Vel (m/s)  Left Peroneal Motor (Ext Dig Brev)  32 C  Ankle    4.7 <6.0 3.0 >2.5 B Fib Ankle 6.6 35.0 53 >40  B Fib    11.3  2.9  Poplt B Fib 1.4 8.0 57 >40  Poplt    12.7  2.8         Right Peroneal Motor (Ext Dig Brev)  32 C  Ankle    3.1 <6.0 2.7 >2.5 B Fib  Ankle 7.1 32.0 45 >40  B Fib    10.2  2.5  Poplt B Fib 1.5 8.0 53 >40  Poplt    11.7  2.5         Left Tibial Motor (Abd Hall Brev)  32 C  Ankle    4.0 <6.0 8.6 >4 Knee Ankle 8.3 40.0 48 >40  Knee    12.3  5.9         Right Tibial Motor (Abd Hall Brev)  32 C  Ankle    3.0 <6.0 9.5 >4 Knee Ankle 9.2 40.0 43 >40  Knee    12.2  6.6          Electromyography   Side Muscle Ins.Act Fibs Fasc Recrt Amp Dur Poly Activation Comment  Right AntTibialis Nml Nml Nml Nml Nml Nml Nml Nml N/A  Right Gastroc Nml Nml Nml Nml Nml Nml Nml Nml N/A  Right Flex Dig Long Nml Nml Nml Nml Nml Nml Nml Nml N/A  Right RectFemoris Nml Nml Nml Nml Nml Nml Nml Nml N/A  Right GluteusMed Nml Nml Nml Nml Nml Nml Nml Nml N/A  Left AntTibialis Nml Nml Nml Nml Nml Nml Nml Nml N/A  Left  Gastroc Nml Nml Nml Nml Nml Nml Nml Nml N/A  Left Flex Dig Long Nml Nml Nml Nml Nml Nml Nml Nml N/A  Left RectFemoris Nml Nml Nml Nml Nml Nml Nml Nml N/A  Left GluteusMed Nml Nml Nml Nml Nml Nml Nml Nml N/A      Waveforms:

## 2023-07-10 ENCOUNTER — Ambulatory Visit: Payer: Medicare Other | Admitting: Student in an Organized Health Care Education/Training Program

## 2023-07-11 ENCOUNTER — Telehealth (INDEPENDENT_AMBULATORY_CARE_PROVIDER_SITE_OTHER): Payer: Medicare Other | Admitting: Family Medicine

## 2023-07-11 ENCOUNTER — Encounter: Payer: Self-pay | Admitting: Family Medicine

## 2023-07-11 DIAGNOSIS — R5383 Other fatigue: Secondary | ICD-10-CM | POA: Diagnosis not present

## 2023-07-11 DIAGNOSIS — M1612 Unilateral primary osteoarthritis, left hip: Secondary | ICD-10-CM | POA: Diagnosis not present

## 2023-07-11 DIAGNOSIS — E876 Hypokalemia: Secondary | ICD-10-CM

## 2023-07-11 DIAGNOSIS — M791 Myalgia, unspecified site: Secondary | ICD-10-CM

## 2023-07-11 DIAGNOSIS — Z9884 Bariatric surgery status: Secondary | ICD-10-CM

## 2023-07-11 DIAGNOSIS — M2550 Pain in unspecified joint: Secondary | ICD-10-CM

## 2023-07-11 MED ORDER — TRAMADOL HCL 50 MG PO TABS: 50.0000 mg | ORAL_TABLET | Freq: Two times a day (BID) | ORAL | 0 refills | Status: AC

## 2023-07-11 NOTE — Patient Instructions (Signed)
 Polymyalgia Rheumatica Polymyalgia rheumatica (PMR) is an inflammatory disorder that causes the muscles and joints to ache and become stiff. Sometimes, PMR leads to a more dangerous condition that can cause vision loss (temporal arteritis or giant cell arteritis). What are the causes? The exact cause of PMR is not known. What increases the risk? You are more likely to develop this condition if you are: Female. 69 years old or older. Of [place] European descent. What are the signs or symptoms? Pain and stiffness are the main symptoms of PMR and affect both sides of the body. These symptoms: May be worse after inactivity and in the morning. May affect your: Hips, buttocks, and thighs. Neck, arms, and shoulders. This can make it hard to raise your arms above your head. Hands and wrists. Other symptoms include: Fever. Tiredness. Weakness. Depression. Decreased appetite. This may lead to weight loss. Symptoms could start slowly but often come on suddenly, sometimes even overnight, or over a few days. How is this diagnosed? This condition is diagnosed with your medical history and a physical exam. You may need to see a health care provider who specializes in diseases of the joints, muscles, and bones (rheumatologist). You may also have tests, including: Blood tests. Often, test results that show inflammation are very elevated. Imaging studies like X-rays or ultrasound, which may be done to rule out other conditions. These are often usual in PMR. How is this treated? PMR usually goes away without treatment, but it may take years. PMR often responds rapidly (within a few days) to low-dose glucocorticoids (steroids). These medicines have serious side effects. Once your symptoms are better controlled, the dose should be lowered to find the lowest possible dose that controls your symptoms. Regular exercise and rest will also help your symptoms. Follow these instructions at home:  Take  over-the-counter and prescription medicines only as told by your health care provider. Make sure to get enough rest and sleep. Eat a healthy and nutritious diet that includes calcium and vitamin D. Calcium is important for bone health, and vitamin D helps your body absorb calcium. Good sources of calcium and vitamin D include: Certain fatty fish, such as salmon and tuna. Products that have calcium and vitamin D added to them (are fortified), such as fortified cereals. Collard greens, turnip greens, broccoli, and kale. Egg yolks. Cheese. Liver. Try to exercise most days of the week. Ask your health care provider what type of exercises are best for you. Keep all follow-up visits. This is important. Contact a health care provider if: Your symptoms do not improve with medicine. You have side effects from steroids. These may include: Weight gain. Swelling. Insomnia. Mood changes. Bruising. High blood sugar readings, if you have diabetes. Higher than normal blood pressure readings, if you monitor your blood pressure. Get help right away if: You develop symptoms of temporal arteritis, such as: A change in vision. Severe headache. Scalp pain. Jaw pain. These symptoms may represent a serious problem that is an emergency. Do not wait to see if the symptoms will go away. Get medical help right away. Call your local emergency services (911 in the [place] Do not drive yourself to the hospital. Summary Polymyalgia rheumatica is an inflammatory disorder that causes aching and stiffness in your muscles and joints. The exact cause of this condition is not known. This condition usually goes away without treatment. Your health care provider may give you low-dose glucocorticoids (steroids) to help manage your pain and stiffness. Rest and regular exercise will help  the symptoms. This information is not intended to replace advice given to you by your health care provider. Make sure you discuss any  questions you have with your health care provider. Document Revised: 10/12/2020 Document Reviewed: 10/12/2020 Elsevier Patient Education  2024 [employer].

## 2023-07-11 NOTE — Progress Notes (Signed)
Name: Jasmine Woods   MRN: [id number]    DOB: [birth year]   Date:07/11/2023       Progress Note  Subjective  Chief Complaint  Chief Complaint  Patient presents with   Muscle Pain    And joint pain, since October getting worst at times go by.   Abnormal Lab    Would like to have potassium lab re check    I connected with  Alois Cliche  on 07/11/23 at  9:40 AM EST by a video enabled telemedicine application and verified that I am speaking with the correct person using two identifiers.  I discussed the limitations of evaluation and management by telemedicine and the availability of in person appointments. The patient expressed understanding and agreed to proceed with a virtual visit  Staff also discussed with the patient that there may be a patient responsible charge related to this service. Patient Location: at home Provider Location: [facility] Additional Individuals present: alone  Discussed the use of AI scribe software for clinical note transcription with the patient, who gave verbal consent to proceed.  History of Present Illness   The patient, with a history of fibromyalgia and a need for left hip replacement, presents with ongoing joint and muscle pain. The pain is widespread, affecting the upper arms, shoulders, front of thighs, buttocks, and knees. The pain in the left hip has improved somewhat following an injection in December, but the patient continues to experience difficulty walking. The patient has been taking regular doses of Advil, Tylenol, and muscle relaxers since October. The patient reports that the pain started in October and has escalated since then.  The patient also reports fatigue and difficulty sleeping due to the pain, often sleeping in a recliner for relief. The patient has been waking up every two hours due to discomfort. The patient also reports a history of low potassium, as indicated in a metabolic panel conducted in December.  The patient  has tried prednisone in October but did not find it helpful. The patient has also been taking Crestor for cholesterol management. The patient has a history of bariatric surgery and has been advised against taking NSAIDs due to potential stomach issues. The patient has been taking ibuprofen since October for pain management. The patient has also tried oxycodone in November but did not find it helpful.  The patient has undergone an EMG, which was within normal limits. The patient is currently working on losing weight to qualify for a hip replacement surgery. The patient needs to lose about fifteen more pounds for the surgery to be considered.        Patient Active Problem List   Diagnosis Date Noted   Fatigue 03/24/2023   Carpal tunnel syndrome, right upper limb 08/19/2019   Carpal tunnel syndrome, left upper limb 06/08/2019   Multinodular goiter 11/25/2018   Essential hypertension 11/11/2018   DOE (dyspnea on exertion) 04/15/2018   Hyperlipidemia 12/09/2017   Diaphoresis 11/26/2017   Special screening for malignant neoplasms, colon    Epiretinal membrane (ERM) of left eye 09/09/2016   Glaucoma, left eye 09/05/2016   Seronegative myasthenia gravis (HCC) 08/08/2015   Psoriasis 07/06/2015   Stress incontinence 07/06/2015   Chronic tension-type headache, intractable 05/29/2015   Anxiety and depression 04/12/2015   Asthma, mild intermittent 04/12/2015   Bladder polyp 04/12/2015   Carpal tunnel syndrome 04/12/2015   Binocular vision disorder with diplopia 04/12/2015   Dyslipidemia 04/12/2015   H/O: HTN (hypertension) 04/12/2015   Hemorrhoids, internal 04/12/2015  Neuropathy 04/12/2015   Nuclear sclerotic cataract 04/12/2015   Morbid obesity (HCC) 04/12/2015   Obstructive apnea 04/12/2015   Perennial allergic rhinitis 04/12/2015   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/12/2015   Type 2 diabetes, controlled, with neuropathy (HCC) 04/12/2015   Vitamin D deficiency  04/12/2015   Cephalalgia 08/16/2014   Bariatric surgery status 10/12/2012    Social History   Tobacco Use   Smoking status: Former    Current packs/day: 0.00    Average packs/day: 0.5 packs/day for 3.0 years (1.5 ttl pk-yrs)    Types: Cigarettes    Start date: 06/24/1972    Quit date: 06/25/1975    Years since quitting: 48.0   Smokeless tobacco: Never   Tobacco comments:    smoked as teenager  Substance Use Topics   Alcohol use: No    Alcohol/week: 0.0 standard drinks of alcohol     Current Outpatient Medications:    acetaminophen (TYLENOL) 500 MG tablet, Take 1,000 mg by mouth every 6 (six) hours as needed for mild pain. Reported on 07/06/2015, Disp: , Rfl:    albuterol (VENTOLIN HFA) 108 (90 Base) MCG/ACT inhaler, INHALE 2 PUFFS BY MOUTH EVERY 4 HOURS AS NEEDED FOR WHEEZING OR SHORTNESS OF BREATH., Disp: 18 g, Rfl: 0   budesonide (RHINOCORT AQUA) 32 MCG/ACT nasal spray, Place 2 sprays into both nostrils at bedtime., Disp: 15 mL, Rfl: 2   cholecalciferol (VITAMIN D3) 25 MCG (1000 UNIT) tablet, Take 1,000 Units by mouth daily., Disp: , Rfl:    Coenzyme Q10 (CO Q 10) 100 MG CAPS, Take 1 capsule by mouth daily., Disp: 30 capsule, Rfl: 2   cyanocobalamin (VITAMIN B12) 500 MCG tablet, Take 500 mcg by mouth 3 (three) times a week., Disp: , Rfl:    DULoxetine (CYMBALTA) 60 MG capsule, TAKE 1 CAPSULE (60 MG TOTAL) BY MOUTH DAILY., Disp: 90 capsule, Rfl: 1   fluticasone-salmeterol (ADVAIR HFA) 45-21 MCG/ACT inhaler, Inhale 2 puffs into the lungs 2 (two) times daily., Disp: 1 each, Rfl: 2   glucose blood (FREESTYLE LITE) test strip, 100 each by Other route daily. Use one strip daily for blood glucose testing, Disp: 100 each, Rfl: 12   Lancets (FREESTYLE) lancets, Use one lancet daily for blood glucose testing, Disp: 100 each, Rfl: 12   montelukast (SINGULAIR) 10 MG tablet, TAKE 1 TABLET BY MOUTH EVERYDAY AT BEDTIME, Disp: 90 tablet, Rfl: 1   Multiple Vitamins-Minerals (MULTIVITAMIN WITH  MINERALS) tablet, Take 1 tablet by mouth daily., Disp: , Rfl:    Omega-3 Fatty Acids (FISH OIL) 1200 MG CAPS, Take 2 capsules by mouth daily., Disp: , Rfl:    pantoprazole (PROTONIX) 40 MG tablet, TAKE 1 TABLET BY MOUTH EVERY DAY, Disp: 90 tablet, Rfl: 0   pyridostigmine (MESTINON) 60 MG tablet, TAKE 1 TABLET AT 8 AM, NOON, AND AT 4 PM. OK TO TAKE EXTRA DOSE AS NEEDED., Disp: 360 tablet, Rfl: 3   telmisartan-hydrochlorothiazide (MICARDIS HCT) 80-25 MG tablet, Take 1 tablet by mouth daily., Disp: 90 tablet, Rfl: 1   tirzepatide (MOUNJARO) 7.5 MG/0.5ML Pen, Inject 7.5 mg into the skin once a week., Disp: 6 mL, Rfl: 1   tiZANidine (ZANAFLEX) 4 MG tablet, Take 0.5-1.5 tablets (2-6 mg total) by mouth every 8 (eight) hours as needed for muscle spasms (muscle tightness)., Disp: 90 tablet, Rfl: 2   traMADol (ULTRAM) 50 MG tablet, Take 1 tablet (50 mg total) by mouth 2 (two) times daily., Disp: 60 tablet, Rfl: 0  Allergies  Allergen Reactions  Aspirin Swelling    Upset stomach  Upset stomach    Azithromycin     Contraindicated in Myasthenia    Ace Inhibitors     cough   Amlodipine     Edema of feet and hands   Gabapentin     anxiety   Pregabalin Other (See Comments)    anxiety    I personally reviewed active problem list, medication list, allergies with the patient/caregiver today.  ROS  Ten systems reviewed and is negative except as mentioned in HPI    Objective  Virtual encounter, vitals not obtained.  There is no height or weight on file to calculate BMI.  Nursing Note and Vital Signs reviewed.  Physical Exam  Awake, alert and oriented   Assessment and Plan    Widespread Joint and Muscle Pain/other fatigue New onset of widespread joint and muscle pain, particularly in the upper arms, shoulders, front of thighs, buttocks, and knees. Pain is not well controlled with Advil, Tylenol, and muscle relaxers. Concern for polymyalgia rheumatica given the patient's age, gender, and  symptom distribution. -Order labs including CBC, comprehensive metabolic panel, CK , C-reactive protein, and sedimentation rate. -Refer to rheumatology for further evaluation. -Discontinue ibuprofen due to history of bariatric surgery and potential for gastric irritation. -Prescribe tramadol for pain control, to be taken twice daily as needed. -Stop Crestor for two weeks to assess for potential drug-induced myalgia. -Follow-up in one month to assess response to changes in medication and discuss lab results.  Left Hip Osteoarthritis Patient is experiencing pain and difficulty walking, partially relieved by a recent hip injection. Patient is planning for hip replacement surgery pending further weight loss. -Continue current pain management plan and weight loss efforts for hip replacement candidacy.  Low Potassium Noted on recent metabolic panel in December. -Repeat metabolic panel to reassess potassium level.  Fibromyalgia Chronic condition, currently managed with duloxetine. -Continue duloxetine.      -Red flags and when to present for emergency care or RTC including fever >101.25F, chest pain, shortness of breath, new/worsening/un-resolving symptoms,  reviewed with patient at time of visit. Follow up and care instructions discussed and provided in AVS. - I discussed the assessment and treatment plan with the patient. The patient was provided an opportunity to ask questions and all were answered. The patient agreed with the plan and demonstrated an understanding of the instructions.  I provided 25 minutes of non-face-to-face time during this encounter.  Ruel Favors, MD

## 2023-07-14 ENCOUNTER — Encounter: Payer: Self-pay | Admitting: Family Medicine

## 2023-07-14 ENCOUNTER — Other Ambulatory Visit: Payer: Self-pay | Admitting: Family Medicine

## 2023-07-14 DIAGNOSIS — M791 Myalgia, unspecified site: Secondary | ICD-10-CM

## 2023-07-14 DIAGNOSIS — M2550 Pain in unspecified joint: Secondary | ICD-10-CM

## 2023-07-14 DIAGNOSIS — R768 Other specified abnormal immunological findings in serum: Secondary | ICD-10-CM

## 2023-07-14 LAB — COMPLETE METABOLIC PANEL WITH GFR
AG Ratio: 1.5 (calc) (ref 1.0–2.5)
ALT: 13 U/L (ref 6–29)
AST: 16 U/L (ref 10–35)
Albumin: 3.8 g/dL (ref 3.6–5.1)
Alkaline phosphatase (APISO): 49 U/L (ref 37–153)
BUN: 19 mg/dL (ref 7–25)
CO2: 28 mmol/L (ref 20–32)
Calcium: 9.3 mg/dL (ref 8.6–10.4)
Chloride: 104 mmol/L (ref 98–110)
Creat: 0.74 mg/dL (ref 0.50–1.05)
Globulin: 2.5 g/dL (ref 1.9–3.7)
Glucose, Bld: 83 mg/dL (ref 65–99)
Potassium: 4.1 mmol/L (ref 3.5–5.3)
Sodium: 140 mmol/L (ref 135–146)
Total Bilirubin: 0.3 mg/dL (ref 0.2–1.2)
Total Protein: 6.3 g/dL (ref 6.1–8.1)
eGFR: 88 mL/min/{1.73_m2} (ref >=60)

## 2023-07-14 LAB — CBC WITH DIFFERENTIAL/PLATELET
Absolute Lymphocytes: 3352 {cells}/uL (ref 850–3900)
Absolute Monocytes: 932 {cells}/uL (ref 200–950)
Basophils Absolute: 36 {cells}/uL (ref 0–200)
Basophils Relative: 0.3 %
Eosinophils Absolute: 169 {cells}/uL (ref 15–500)
Eosinophils Relative: 1.4 %
HCT: 36.8 % (ref 35.0–45.0)
Hemoglobin: 11.9 g/dL (ref 11.7–15.5)
MCH: 28 pg (ref 27.0–33.0)
MCHC: 32.3 g/dL (ref 32.0–36.0)
MCV: 86.6 fL (ref 80.0–100.0)
MPV: 10.3 fL (ref 7.5–12.5)
Monocytes Relative: 7.7 %
Neutro Abs: 7611 {cells}/uL (ref 1500–7800)
Neutrophils Relative %: 62.9 %
Platelets: 385 10*3/uL (ref 140–400)
RBC: 4.25 10*6/uL (ref 3.80–5.10)
RDW: 13.7 % (ref 11.0–15.0)
Total Lymphocyte: 27.7 %
WBC: 12.1 10*3/uL — ABNORMAL HIGH (ref 3.8–10.8)

## 2023-07-14 LAB — SEDIMENTATION RATE: Sed Rate: 55 mm/h — ABNORMAL HIGH (ref 0–30)

## 2023-07-14 LAB — ANTI-NUCLEAR AB-TITER (ANA TITER): ANA Titer 1: 1:160 {titer} — ABNORMAL HIGH

## 2023-07-14 LAB — ANA,IFA RA DIAG PNL W/RFLX TIT/PATN
Anti Nuclear Antibody (ANA): POSITIVE — AB
Cyclic Citrullin Peptide Ab: <16 U
Rheumatoid fact SerPl-aCnc: <10 [IU]/mL (ref <14)

## 2023-07-14 LAB — CK: Total CK: 46 U/L (ref 20–243)

## 2023-07-14 LAB — C-REACTIVE PROTEIN: CRP: 12.5 mg/L — ABNORMAL HIGH (ref <8.0)

## 2023-07-25 ENCOUNTER — Other Ambulatory Visit: Payer: Self-pay | Admitting: Family Medicine

## 2023-07-25 ENCOUNTER — Encounter: Payer: Self-pay | Admitting: Family Medicine

## 2023-07-25 ENCOUNTER — Telehealth (INDEPENDENT_AMBULATORY_CARE_PROVIDER_SITE_OTHER): Payer: Medicare Other | Admitting: Family Medicine

## 2023-07-25 DIAGNOSIS — J0100 Acute maxillary sinusitis, unspecified: Secondary | ICD-10-CM | POA: Diagnosis not present

## 2023-07-25 DIAGNOSIS — B9789 Other viral agents as the cause of diseases classified elsewhere: Secondary | ICD-10-CM | POA: Diagnosis not present

## 2023-07-25 MED ORDER — AMOXICILLIN-POT CLAVULANATE 875-125 MG PO TABS: 1.0000 | ORAL_TABLET | Freq: Two times a day (BID) | ORAL | 0 refills | Status: DC

## 2023-07-25 NOTE — Progress Notes (Signed)
Name: Jasmine Woods   MRN: [id number]    DOB: Oct 23, 1955   Date:07/25/2023       Progress Note  Subjective  Chief Complaint  Chief Complaint  Patient presents with   Cough    A little   Sinusitis    Facial pain, onset for over a week   Nasal Congestion    I connected with  Alois Cliche  on 07/25/23 at 10:00 AM EST by a video enabled telemedicine application and verified that I am speaking with the correct person using two identifiers.  I discussed the limitations of evaluation and management by telemedicine and the availability of in person appointments. The patient expressed understanding and agreed to proceed with a virtual visit  Staff also discussed with the patient that there may be a patient responsible charge related to this service. Patient Location: at home  Provider Location: [facility] Additional Individuals present: alone   Discussed the use of AI scribe software for clinical note transcription with the patient, who gave verbal consent to proceed.  History of Present Illness   The patient, with myasthenia gravis, presents with acute sinus symptoms and ongoing joint and muscle pain.  She has been experiencing acute sinus symptoms for the past two weeks, initially accompanied by a fever that has since resolved. Symptoms include significant sinus pressure, particularly in the cheeks, nasal congestion, postnasal drainage, and occasional headaches. She also has an intermittent runny nose and a sensation of fullness in the right ear, which she manages by equalizing pressure. She has been using a neti pot, saline spray, and an over-the-counter medication for high blood pressure to alleviate symptoms. No changes in smell or taste, and appetite remains normal.  In addition to sinus issues, she continues to experience muscle and joint pain. She has received some relief from a recent hip injection administered by an orthopedic specialist in [place]. She has been taking  Tylenol for joint and muscle pain.  She has a history of myasthenia gravis, which affects her choice of antibiotics. She is cautious with medications due to this condition.        Patient Active Problem List   Diagnosis Date Noted   Fatigue 03/24/2023   Carpal tunnel syndrome, right upper limb 08/19/2019   Carpal tunnel syndrome, left upper limb 06/08/2019   Multinodular goiter 11/25/2018   Essential hypertension 11/11/2018   DOE (dyspnea on exertion) 04/15/2018   Hyperlipidemia 12/09/2017   Diaphoresis 11/26/2017   Special screening for malignant neoplasms, colon    Epiretinal membrane (ERM) of left eye 09/09/2016   Glaucoma, left eye 09/05/2016   Seronegative myasthenia gravis (HCC) 08/08/2015   Psoriasis 07/06/2015   Stress incontinence 07/06/2015   Chronic tension-type headache, intractable 05/29/2015   Anxiety and depression 04/12/2015   Asthma, mild intermittent 04/12/2015   Bladder polyp 04/12/2015   Carpal tunnel syndrome 04/12/2015   Binocular vision disorder with diplopia 04/12/2015   Dyslipidemia 04/12/2015   H/O: HTN (hypertension) 04/12/2015   Hemorrhoids, internal 04/12/2015   Neuropathy 04/12/2015   Nuclear sclerotic cataract 04/12/2015   Morbid obesity (HCC) 04/12/2015   Obstructive apnea 04/12/2015   Perennial allergic rhinitis 04/12/2015   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/12/2015   Type 2 diabetes, controlled, with neuropathy (HCC) 04/12/2015   Vitamin D deficiency 04/12/2015   Cephalalgia 08/16/2014   Bariatric surgery status 10/12/2012    Social History   Tobacco Use   Smoking status: Former    Current packs/day: 0.00  Average packs/day: 0.5 packs/day for 3.0 years (1.5 ttl pk-yrs)    Types: Cigarettes    Start date: 06/24/1972    Quit date: 06/25/1975    Years since quitting: 48.1   Smokeless tobacco: Never   Tobacco comments:    smoked as teenager  Substance Use Topics   Alcohol use: No    Alcohol/week: 0.0 standard  drinks of alcohol     Current Outpatient Medications:    acetaminophen (TYLENOL) 500 MG tablet, Take 1,000 mg by mouth every 6 (six) hours as needed for mild pain. Reported on 07/06/2015, Disp: , Rfl:    albuterol (VENTOLIN HFA) 108 (90 Base) MCG/ACT inhaler, INHALE 2 PUFFS BY MOUTH EVERY 4 HOURS AS NEEDED FOR WHEEZING OR SHORTNESS OF BREATH., Disp: 18 g, Rfl: 0   budesonide (RHINOCORT AQUA) 32 MCG/ACT nasal spray, Place 2 sprays into both nostrils at bedtime., Disp: 15 mL, Rfl: 2   cholecalciferol (VITAMIN D3) 25 MCG (1000 UNIT) tablet, Take 1,000 Units by mouth daily., Disp: , Rfl:    Coenzyme Q10 (CO Q 10) 100 MG CAPS, Take 1 capsule by mouth daily., Disp: 30 capsule, Rfl: 2   cyanocobalamin (VITAMIN B12) 500 MCG tablet, Take 500 mcg by mouth 3 (three) times a week., Disp: , Rfl:    DULoxetine (CYMBALTA) 60 MG capsule, TAKE 1 CAPSULE (60 MG TOTAL) BY MOUTH DAILY., Disp: 90 capsule, Rfl: 1   fluticasone-salmeterol (ADVAIR HFA) 45-21 MCG/ACT inhaler, Inhale 2 puffs into the lungs 2 (two) times daily., Disp: 1 each, Rfl: 2   glucose blood (FREESTYLE LITE) test strip, 100 each by Other route daily. Use one strip daily for blood glucose testing, Disp: 100 each, Rfl: 12   Lancets (FREESTYLE) lancets, Use one lancet daily for blood glucose testing, Disp: 100 each, Rfl: 12   montelukast (SINGULAIR) 10 MG tablet, TAKE 1 TABLET BY MOUTH EVERYDAY AT BEDTIME, Disp: 90 tablet, Rfl: 1   Multiple Vitamins-Minerals (MULTIVITAMIN WITH MINERALS) tablet, Take 1 tablet by mouth daily., Disp: , Rfl:    Omega-3 Fatty Acids (FISH OIL) 1200 MG CAPS, Take 2 capsules by mouth daily., Disp: , Rfl:    pantoprazole (PROTONIX) 40 MG tablet, TAKE 1 TABLET BY MOUTH EVERY DAY, Disp: 90 tablet, Rfl: 0   pyridostigmine (MESTINON) 60 MG tablet, TAKE 1 TABLET AT 8 AM, NOON, AND AT 4 PM. OK TO TAKE EXTRA DOSE AS NEEDED., Disp: 360 tablet, Rfl: 3   telmisartan-hydrochlorothiazide (MICARDIS HCT) 80-25 MG tablet, Take 1 tablet by  mouth daily., Disp: 90 tablet, Rfl: 1   tirzepatide (MOUNJARO) 7.5 MG/0.5ML Pen, Inject 7.5 mg into the skin once a week., Disp: 6 mL, Rfl: 1   tiZANidine (ZANAFLEX) 4 MG tablet, Take 0.5-1.5 tablets (2-6 mg total) by mouth every 8 (eight) hours as needed for muscle spasms (muscle tightness)., Disp: 90 tablet, Rfl: 2   traMADol (ULTRAM) 50 MG tablet, Take 1 tablet (50 mg total) by mouth 2 (two) times daily., Disp: 60 tablet, Rfl: 0  Allergies  Allergen Reactions   Aspirin Swelling    Upset stomach  Upset stomach    Azithromycin     Contraindicated in Myasthenia    Ace Inhibitors     cough   Amlodipine     Edema of feet and hands   Gabapentin     anxiety   Pregabalin Other (See Comments)    anxiety    I personally reviewed active problem list, medication list, allergies, family history with the patient/caregiver today.  ROS  Ten systems reviewed and is negative except as mentioned in HPI    Objective  Virtual encounter, vitals not obtained.  There is no height or weight on file to calculate BMI.  Nursing Note and Vital Signs reviewed.  Physical Exam  Awake, alert and oriented, rhinorrhea  Assessment and Plan    Acute Sinusitis Two-week history of sinus pressure, nasal congestion, postnasal drainage, and intermittent headaches. No fever currently. Patient has been using Coricidin HBP, neti pot, and saline spray with partial relief. Unable to take azithromycin due to contraindication with myasthenia gravis. -Prescribe Augmentin for 10 days. -Advise to take with a probiotic to prevent diarrhea. -Continue current symptomatic treatments.  Musculoskeletal Pain Persistent muscle and joint pain despite hip injection by orthopedic specialist. Awaiting rheumatology appointment. -Continue current pain management regimen. -Follow-up with rheumatology as scheduled.  Follow-up appointment scheduled for August 08, 2023 to discuss lab results.                    There  are no diagnoses linked to this encounter.  -Red flags and when to present for emergency care or RTC including fever >101.12F, chest pain, shortness of breath, new/worsening/un-resolving symptoms,  reviewed with patient at time of visit. Follow up and care instructions discussed and provided in AVS. - I discussed the assessment and treatment plan with the patient. The patient was provided an opportunity to ask questions and all were answered. The patient agreed with the plan and demonstrated an understanding of the instructions.  I provided 15 minutes of non-face-to-face time during this encounter.  Ruel Favors, MD

## 2023-07-26 ENCOUNTER — Other Ambulatory Visit: Payer: Self-pay | Admitting: Family Medicine

## 2023-07-26 DIAGNOSIS — M5441 Lumbago with sciatica, right side: Secondary | ICD-10-CM

## 2023-07-28 NOTE — Telephone Encounter (Signed)
Requested medication (s) are due for refill today:   Provider to review  Requested medication (s) are on the active medication list:   Yes  Future visit scheduled:   No.   Seen 3 days ago.   Last ordered: 04/28/2023 #90, 2 refills    Non delegated refill   Requested Prescriptions  Pending Prescriptions Disp Refills   tiZANidine (ZANAFLEX) 4 MG tablet [Pharmacy Med Name: TIZANIDINE HCL 4 MG TABLET] 90 tablet 2    Sig: TAKE 0.5-1.5 TABLETS BY MOUTH EVERY 8 (EIGHT) HOURS AS NEEDED FOR MUSCLE SPASMS (MUSCLE TIGHTNESS)     Not Delegated - Cardiovascular:  Alpha-2 Agonists - tizanidine Failed - 07/28/2023  1:15 PM      Failed - This refill cannot be delegated      Passed - Valid encounter within last 6 months    Recent Outpatient Visits           3 days ago Acute non-recurrent maxillary sinusitis   [facility] Alba Cory, MD   2 weeks ago Myalgia   [facility] [place], Danna Hefty, MD   2 months ago Type 2 diabetes, controlled, with neuropathy [facility])    [facility] Alba Cory, MD   3 months ago Left-sided low back pain with bilateral sciatica, unspecified chronicity   [facility] Danelle Berry, PA-C   3 months ago Left hip pain   [facility] Danelle Berry, [place]       Future Appointments             In 1 week Alba Cory, MD [facility]   In 1 month Alba Cory, MD [facility]

## 2023-08-07 ENCOUNTER — Telehealth: Payer: Self-pay | Admitting: Family Medicine

## 2023-08-07 NOTE — Telephone Encounter (Signed)
Copied from CRM [id number]. Topic: Medicare AWV >> Aug 07, 2023 12:54 PM Ja-Kwan M wrote: Reason for CRM: Pt requests to reschedule Medicare AWV but there are no available appts until September. Pt requests call back to reschedule the appt.

## 2023-08-07 NOTE — Telephone Encounter (Signed)
I spoke with patient and rescheduled AWV to 10/08/2023 at 8:40.

## 2023-08-08 ENCOUNTER — Encounter: Payer: Self-pay | Admitting: Family Medicine

## 2023-08-08 ENCOUNTER — Ambulatory Visit (INDEPENDENT_AMBULATORY_CARE_PROVIDER_SITE_OTHER): Payer: Medicare Other | Admitting: Family Medicine

## 2023-08-08 VITALS — BP 134/72 | HR 88 | Resp 16 | Ht 62.0 in | Wt 231.4 lb

## 2023-08-08 DIAGNOSIS — R768 Other specified abnormal immunological findings in serum: Secondary | ICD-10-CM | POA: Diagnosis not present

## 2023-08-08 DIAGNOSIS — M2550 Pain in unspecified joint: Secondary | ICD-10-CM | POA: Diagnosis not present

## 2023-08-08 NOTE — Progress Notes (Signed)
Name: Jasmine Woods   MRN: [id number]    DOB: [birth year]   Date:08/08/2023       Progress Note  Subjective  Chief Complaint  Chief Complaint  Patient presents with   Medical Management of Chronic Issues    myalgia and arthralgia only in one month   HPI   She is here today to discuss lab results. Reviewed labs with patient, since last visit she was seen by ortho and had steroid injection left hip , pain all over her joints has improved a little, pain level is 5/10 and seems better since the steroid injection. She will see Rheumatologist March 6th. She has been taking Tylenol and tramadol prn only    Patient Active Problem List   Diagnosis Date Noted   Fatigue 03/24/2023   Carpal tunnel syndrome, right upper limb 08/19/2019   Carpal tunnel syndrome, left upper limb 06/08/2019   Multinodular goiter 11/25/2018   Essential hypertension 11/11/2018   DOE (dyspnea on exertion) 04/15/2018   Hyperlipidemia 12/09/2017   Diaphoresis 11/26/2017   Special screening for malignant neoplasms, colon    Epiretinal membrane (ERM) of left eye 09/09/2016   Glaucoma, left eye 09/05/2016   Seronegative myasthenia gravis (HCC) 08/08/2015   Psoriasis 07/06/2015   Stress incontinence 07/06/2015   Chronic tension-type headache, intractable 05/29/2015   Anxiety and depression 04/12/2015   Asthma, mild intermittent 04/12/2015   Bladder polyp 04/12/2015   Carpal tunnel syndrome 04/12/2015   Binocular vision disorder with diplopia 04/12/2015   Dyslipidemia 04/12/2015   H/O: HTN (hypertension) 04/12/2015   Hemorrhoids, internal 04/12/2015   Neuropathy 04/12/2015   Nuclear sclerotic cataract 04/12/2015   Morbid obesity (HCC) 04/12/2015   Obstructive apnea 04/12/2015   Perennial allergic rhinitis 04/12/2015   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/12/2015   Type 2 diabetes, controlled, with neuropathy (HCC) 04/12/2015   Vitamin D deficiency 04/12/2015   Cephalalgia 08/16/2014    Bariatric surgery status 10/12/2012    Past Surgical History:  Procedure Laterality Date   ABDOMINAL HYSTERECTOMY  1991   BARIATRIC SURGERY  2014   CARPAL TUNNEL RELEASE Left 06/08/2019   Procedure: LEFT CARPAL TUNNEL RELEASE;  Surgeon: Kathryne Hitch, MD;  Location: [facility];  Service: Orthopedics;  Laterality: Left;   CARPAL TUNNEL RELEASE Right 08/19/2019   Procedure: RIGHT CARPAL TUNNEL RELEASE;  Surgeon: Kathryne Hitch, MD;  Location: [facility];  Service: Orthopedics;  Laterality: Right;   CATARACT EXTRACTION W/PHACO Left 01/28/2017   Procedure: CATARACT EXTRACTION PHACO AND INTRAOCULAR LENS PLACEMENT (IOC);  Surgeon: Galen Manila, MD;  Location: [facility];  Service: Ophthalmology;  Laterality: Left;  [place] 00:37 AP% 17.8 CDE 6.61 Fluid pack lot # [id number]   CATARACT EXTRACTION W/PHACO Right 05/20/2023   Procedure: CATARACT EXTRACTION PHACO AND INTRAOCULAR LENS PLACEMENT (IOC) RIGHT DIABETIC  7.35  00:46.2;  Surgeon: Galen Manila, MD;  Location: [facility];  Service: Ophthalmology;  Laterality: Right;   COLONOSCOPY  2012   COLONOSCOPY WITH PROPOFOL N/A 09/30/2016   Procedure: COLONOSCOPY WITH PROPOFOL;  Surgeon: Midge Minium, MD;  Location: [facility];  Service: Endoscopy;  Laterality: N/A;  diabetic - diet controlled sleep apnea   KNEE SURGERY Right 1979-1980   OOPHORECTOMY     PARS PLANA VITRECTOMY Left 03/27/2016   Procedure: PARS PLANA VITRECTOMY WITH 25 GAUGE AND MEMBRANE PEEL, INTERNAL LIMITING MEMBRANE;  Surgeon: Edmon Crape, MD;  Location: [facility];  Service: Ophthalmology;  Laterality: Left;   THYROID SURGERY  BX done was benign   UPPER GI ENDOSCOPY     VITRECTOMY Left 04/27/2016    Family History  Problem Relation Age of Onset   Diabetes Mother    Hypertension Mother    Heart disease Mother 20       stent x 1    Migraines Sister    Stroke Maternal Grandfather    Diabetes Paternal  Grandfather    Muscular dystrophy Sister    Heart attack Maternal Aunt    Breast cancer Neg Hx    Thyroid disease Neg Hx     Social History   Tobacco Use   Smoking status: Former    Current packs/day: 0.00    Average packs/day: 0.5 packs/day for 3.0 years (1.5 ttl pk-yrs)    Types: Cigarettes    Start date: 06/24/1972    Quit date: 06/25/1975    Years since quitting: 48.1   Smokeless tobacco: Never   Tobacco comments:    smoked as teenager  Substance Use Topics   Alcohol use: No    Alcohol/week: 0.0 standard drinks of alcohol     Current Outpatient Medications:    acetaminophen (TYLENOL) 500 MG tablet, Take 1,000 mg by mouth every 6 (six) hours as needed for mild pain. Reported on 07/06/2015, Disp: , Rfl:    albuterol (VENTOLIN HFA) 108 (90 Base) MCG/ACT inhaler, INHALE 2 PUFFS BY MOUTH EVERY 4 HOURS AS NEEDED FOR WHEEZING OR SHORTNESS OF BREATH., Disp: 18 g, Rfl: 0   budesonide (RHINOCORT AQUA) 32 MCG/ACT nasal spray, Place 2 sprays into both nostrils at bedtime., Disp: 15 mL, Rfl: 2   cholecalciferol (VITAMIN D3) 25 MCG (1000 UNIT) tablet, Take 1,000 Units by mouth daily., Disp: , Rfl:    Coenzyme Q10 (CO Q 10) 100 MG CAPS, Take 1 capsule by mouth daily., Disp: 30 capsule, Rfl: 2   cyanocobalamin (VITAMIN B12) 500 MCG tablet, Take 500 mcg by mouth 3 (three) times a week., Disp: , Rfl:    DULoxetine (CYMBALTA) 60 MG capsule, TAKE 1 CAPSULE (60 MG TOTAL) BY MOUTH DAILY., Disp: 90 capsule, Rfl: 1   fluticasone-salmeterol (ADVAIR HFA) 45-21 MCG/ACT inhaler, Inhale 2 puffs into the lungs 2 (two) times daily., Disp: 1 each, Rfl: 2   glucose blood (FREESTYLE LITE) test strip, 100 each by Other route daily. Use one strip daily for blood glucose testing, Disp: 100 each, Rfl: 12   Lancets (FREESTYLE) lancets, Use one lancet daily for blood glucose testing, Disp: 100 each, Rfl: 12   montelukast (SINGULAIR) 10 MG tablet, TAKE 1 TABLET BY MOUTH EVERYDAY AT BEDTIME, Disp: 90 tablet, Rfl: 1    Multiple Vitamins-Minerals (MULTIVITAMIN WITH MINERALS) tablet, Take 1 tablet by mouth daily., Disp: , Rfl:    Omega-3 Fatty Acids (FISH OIL) 1200 MG CAPS, Take 2 capsules by mouth daily., Disp: , Rfl:    pantoprazole (PROTONIX) 40 MG tablet, TAKE 1 TABLET BY MOUTH EVERY DAY, Disp: 90 tablet, Rfl: 0   pyridostigmine (MESTINON) 60 MG tablet, TAKE 1 TABLET AT 8 AM, NOON, AND AT 4 PM. OK TO TAKE EXTRA DOSE AS NEEDED., Disp: 360 tablet, Rfl: 3   telmisartan-hydrochlorothiazide (MICARDIS HCT) 80-25 MG tablet, Take 1 tablet by mouth daily., Disp: 90 tablet, Rfl: 1   tirzepatide (MOUNJARO) 7.5 MG/0.5ML Pen, Inject 7.5 mg into the skin once a week., Disp: 6 mL, Rfl: 1   tiZANidine (ZANAFLEX) 4 MG tablet, TAKE 0.5-1.5 TABLETS BY MOUTH EVERY 8 (EIGHT) HOURS AS NEEDED FOR MUSCLE SPASMS (MUSCLE TIGHTNESS),  Disp: 90 tablet, Rfl: 2   traMADol (ULTRAM) 50 MG tablet, Take 1 tablet (50 mg total) by mouth 2 (two) times daily., Disp: 60 tablet, Rfl: 0   amoxicillin-clavulanate (AUGMENTIN) 875-125 MG tablet, Take 1 tablet by mouth 2 (two) times daily., Disp: 20 tablet, Rfl: 0  Allergies  Allergen Reactions   Aspirin Swelling    Upset stomach  Upset stomach    Azithromycin     Contraindicated in Myasthenia    Ace Inhibitors     cough   Amlodipine     Edema of feet and hands   Gabapentin     anxiety   Pregabalin Other (See Comments)    anxiety    I personally reviewed active problem list with the patient/caregiver today.   ROS  Ten systems reviewed and is negative except as mentioned in HPI    Objective  Vitals:   08/08/23 1103  BP: 134/72  Pulse: 88  Resp: 16  SpO2: 99%  Weight: 231 lb 6.4 oz (105 kg)  Height: 5\' 2"  (1.575 m)    Body mass index is 42.32 kg/m.  Physical Exam  Constitutional: Patient appears well-developed and well-nourished. Obese  No distress.  HEENT: head atraumatic, normocephalic, pupils equal and reactive to light, neck supple Cardiovascular: Normal rate, regular  rhythm and normal heart sounds.  No murmur heard. No BLE edema. Pulmonary/Chest: Effort normal and breath sounds normal. No respiratory distress. Abdominal: Soft.  There is no tenderness. Muscular skeletal: trigger point positive , using a cane and limping  Psychiatric: Patient has a normal mood and affect. behavior is normal. Judgment and thought content normal.   Recent Results (from the past 2160 hours)  POCT HgB A1C     Status: Abnormal   Collection Time: 05/14/23 11:36 AM  Result Value Ref Range   Hemoglobin A1C 6.0 (A) 4.0 - 5.6 %   HbA1c POC (<> result, manual entry)     HbA1c, POC (prediabetic range)     HbA1c, POC (controlled diabetic range)    Glucose, capillary     Status: Abnormal   Collection Time: 05/20/23  6:49 AM  Result Value Ref Range   Glucose-Capillary 107 (H) 70 - 99 mg/dL    Comment: Glucose reference range applies only to samples taken after fasting for at least 8 hours.  Basic Metabolic Panel (BMET)     Status: Abnormal   Collection Time: 05/28/23  2:56 PM  Result Value Ref Range   Sodium 138 135 - 145 mmol/L   Potassium 3.3 (L) 3.5 - 5.1 mmol/L   Chloride 102 98 - 111 mmol/L   CO2 25 22 - 32 mmol/L   Glucose, Bld 94 70 - 99 mg/dL    Comment: Glucose reference range applies only to samples taken after fasting for at least 8 hours.   BUN 20 8 - 23 mg/dL   Creatinine, Ser 4.09 0.44 - 1.00 mg/dL   Calcium 9.3 8.9 - 81.1 mg/dL   GFR, Estimated >91 >47 mL/min    Comment: (NOTE) Calculated using the CKD-EPI Creatinine Equation (2021)    Anion gap 11 5 - 15    Comment: Performed at [facility], [address] [place], [place] [address]  Sedimentation rate     Status: Abnormal   Collection Time: 07/11/23  9:25 AM  Result Value Ref Range   Sed Rate 55 (H) 0 - 30 mm/h  C-reactive protein     Status: Abnormal   Collection Time: 07/11/23  9:25  AM  Result Value Ref Range   CRP 12.5 (H) <8.0 mg/L  CBC with Differential/Platelet     Status: Abnormal    Collection Time: 07/11/23  9:25 AM  Result Value Ref Range   WBC 12.1 (H) 3.8 - 10.8 Thousand/uL   RBC 4.25 3.80 - 5.10 Million/uL   Hemoglobin 11.9 11.7 - 15.5 g/dL   HCT 16.1 09.6 - 04.5 %   MCV 86.6 80.0 - 100.0 fL   MCH 28.0 27.0 - 33.0 pg   MCHC 32.3 32.0 - 36.0 g/dL    Comment: For adults, a slight decrease in the calculated MCHC value (in the range of 30 to 32 g/dL) is most likely not clinically significant; however, it should be interpreted with caution in correlation with other red cell parameters and the patient's clinical condition.    RDW 13.7 11.0 - 15.0 %   Platelets 385 140 - 400 Thousand/uL   MPV 10.3 7.5 - 12.5 fL   Neutro Abs 7,611 1,500 - 7,800 cells/uL   Absolute Lymphocytes 3,352 850 - 3,900 cells/uL   Absolute Monocytes 932 200 - 950 cells/uL   Eosinophils Absolute 169 15 - 500 cells/uL   Basophils Absolute 36 0 - 200 cells/uL   Neutrophils Relative % 62.9 %   Total Lymphocyte 27.7 %   Monocytes Relative 7.7 %   Eosinophils Relative 1.4 %   Basophils Relative 0.3 %  COMPLETE METABOLIC PANEL WITH GFR     Status: None   Collection Time: 07/11/23  9:25 AM  Result Value Ref Range   Glucose, Bld 83 65 - 99 mg/dL    Comment: .            Fasting reference interval .    BUN 19 7 - 25 mg/dL   Creat 4.09 8.11 - 9.14 mg/dL   eGFR 88 > OR = 60 NW/GNF/6.21H0   BUN/Creatinine Ratio SEE NOTE: 6 - 22 (calc)    Comment:    Not Reported: BUN and Creatinine are within    reference range. .    Sodium 140 135 - 146 mmol/L   Potassium 4.1 3.5 - 5.3 mmol/L   Chloride 104 98 - 110 mmol/L   CO2 28 20 - 32 mmol/L   Calcium 9.3 8.6 - 10.4 mg/dL   Total Protein 6.3 6.1 - 8.1 g/dL   Albumin 3.8 3.6 - 5.1 g/dL   Globulin 2.5 1.9 - 3.7 g/dL (calc)   AG Ratio 1.5 1.0 - 2.5 (calc)   Total Bilirubin 0.3 0.2 - 1.2 mg/dL   Alkaline phosphatase (APISO) 49 37 - 153 U/L   AST 16 10 - 35 U/L   ALT 13 6 - 29 U/L  ANA,IFA RA Diag Pnl w/rflx Tit/Patn     Status: Abnormal    Collection Time: 07/11/23  9:25 AM  Result Value Ref Range   Anti Nuclear Antibody (ANA) POSITIVE (A) NEGATIVE    Comment: ANA IFA is a first line screen for detecting the presence of up to approximately 150 autoantibodies in various autoimmune diseases. A positive ANA IFA result is suggestive of autoimmune disease and reflexes to titer and pattern. Further laboratory testing may be considered if clinically indicated. . For additional information, please refer to http://education.[facility].com/faq/FAQ177 (This link is being provided for informational/ educational purposes only.) .    Rheumatoid fact SerPl-aCnc <10 <14 IU/mL   Cyclic Citrullin Peptide Ab <86 UNITS    Comment: Reference Range Negative:            <  20 Weak Positive:       20-39 Moderate Positive:   40-59 Strong Positive:     >59 .    INTERPRETATION      Comment: . A positive ANA, IFA indicates that one or more  antibodies associated with connective tissue disease could be positive. The RF and CCP assays are each  65-70% sensitive for established rheumatoid arthritis.  While it is still possible that this patient has  rheumatoid arthritis, other connective tissue  diseases should be considered.  .   CK     Status: None   Collection Time: 07/11/23  9:25 AM  Result Value Ref Range   Total CK 46 20 - 243 U/L  Anti-nuclear ab-titer (ANA titer)     Status: Abnormal   Collection Time: 07/11/23  9:25 AM  Result Value Ref Range   ANA Titer 1 1:160 (H) titer    Comment:                 Reference Range                 <1:40        Negative                 1:40-1:80    Low Antibody Level                 >1:80        Elevated Antibody Level .    ANA Pattern 1 Nuclear, Speckled (A)     Comment: Speckled pattern is associated with mixed connective tissue disease (MCTD), systemic lupus erythematosus (SLE), Sjogren's syndrome, dermatomyositis, and  systemic sclerosis/polymyositis overlap. . AC-2,4,5,29:  Speckled . International Consensus on ANA Patterns ([email or web address])     Diabetic Foot Exam:     PHQ2/9:    08/08/2023   11:02 AM 07/25/2023    8:22 AM 07/11/2023    7:57 AM 05/14/2023   11:35 AM 04/28/2023    8:09 AM  Depression screen PHQ 2/9  Decreased Interest 0 0 0 0 0  Down, Depressed, Hopeless 0 0 0 0 0  PHQ - 2 Score 0 0 0 0 0  Altered sleeping 0 0 0 0 0  Tired, decreased energy 0 0 0 0 0  Change in appetite 0 0 0 0 0  Feeling bad or failure about yourself  0 0 0 0 0  Trouble concentrating 0 0 0 0 0  Moving slowly or fidgety/restless 0 0 0 0 0  Suicidal thoughts 0 0 0 0 0  PHQ-9 Score 0 0 0 0 0  Difficult doing work/chores Not difficult at all Not difficult at all Not difficult at all  Not difficult at all    phq 9 is negative  Fall Risk:    07/11/2023    7:57 AM 05/14/2023   11:35 AM 04/28/2023    8:09 AM 04/15/2023    2:14 PM 04/10/2023    8:13 AM  Fall Risk   Falls in the past year? 0 0 0 0 0  Number falls in past yr: 0  0 0 0  Injury with Fall? 0  0 0 0  Risk for fall due to : No Fall Risks No Fall Risks No Fall Risks  No Fall Risks  Follow up Falls prevention discussed;Education provided;Falls evaluation completed Falls prevention discussed Falls prevention discussed;Education provided;Falls evaluation completed Falls evaluation completed Falls prevention discussed     Assessment & Plan  1. Arthralgia, unspecified joint (  Primary)  Keep follow up with ortho and Rheumatologist   2. Elevated antinuclear antibody (ANA) level  Pain is a little better, taking tramadol prn

## 2023-08-11 ENCOUNTER — Telehealth: Payer: Self-pay

## 2023-08-11 DIAGNOSIS — G4733 Obstructive sleep apnea (adult) (pediatric): Secondary | ICD-10-CM

## 2023-08-11 NOTE — Telephone Encounter (Signed)
Pt asked for a replacement CPAP via mychart. Order was placed. Message sent on mychart to inform pt. Nfn

## 2023-08-28 LAB — HEMOGLOBIN A1C: Hemoglobin A1C: 5.8

## 2023-09-12 ENCOUNTER — Encounter: Payer: Self-pay | Admitting: Family Medicine

## 2023-09-12 ENCOUNTER — Ambulatory Visit: Payer: Medicare Other | Admitting: Family Medicine

## 2023-09-12 VITALS — BP 126/74 | HR 70 | Resp 16 | Ht 62.0 in | Wt 230.6 lb

## 2023-09-12 DIAGNOSIS — Z9884 Bariatric surgery status: Secondary | ICD-10-CM

## 2023-09-12 DIAGNOSIS — G7000 Myasthenia gravis without (acute) exacerbation: Secondary | ICD-10-CM | POA: Diagnosis not present

## 2023-09-12 DIAGNOSIS — I10 Essential (primary) hypertension: Secondary | ICD-10-CM

## 2023-09-12 DIAGNOSIS — R350 Frequency of micturition: Secondary | ICD-10-CM

## 2023-09-12 DIAGNOSIS — G4733 Obstructive sleep apnea (adult) (pediatric): Secondary | ICD-10-CM

## 2023-09-12 DIAGNOSIS — E1140 Type 2 diabetes mellitus with diabetic neuropathy, unspecified: Secondary | ICD-10-CM

## 2023-09-12 DIAGNOSIS — E042 Nontoxic multinodular goiter: Secondary | ICD-10-CM

## 2023-09-12 DIAGNOSIS — E538 Deficiency of other specified B group vitamins: Secondary | ICD-10-CM

## 2023-09-12 DIAGNOSIS — J3089 Other allergic rhinitis: Secondary | ICD-10-CM

## 2023-09-12 DIAGNOSIS — M2550 Pain in unspecified joint: Secondary | ICD-10-CM

## 2023-09-12 NOTE — Progress Notes (Addendum)
 Name: Jasmine Woods   MRN: [id number]    DOB: 03-09-1956   Date:09/12/2023       Progress Note  Subjective  Chief Complaint  Chief Complaint  Patient presents with   Medical Management of Chronic Issues   HPI   DMII: A1C is at goal at 5.8% currently on Mounjaro 7.5 mg weekly . Peripheral Neuropathy is still present , constant, burning- like, she takes Duloxetine  She could not tolerate Gabapentin or Lyrica and also did not work. She also has associated HTN, obesity and dyslipidemia. She is on ARB and Rosuvastatin.    HTN: she is on Micardis 80/25 mg, bp has been at goal, no chest pain, palpitation or SOB . She states at home bp has been well controlled    Hyperlipidemia:she is currently taking Crestor 20 mg, LDL was 86 in 2023 and had gone up to 125 , we will recheck labs on her next visit   Asthma Mild Intermittent : she is currently taking Advair HFA prn, discussed importance of disregarding after 2 months of opened container. She is doing well, no cough, wheezing or sob.    AR: she is taking loratadine, nasal spray and singulair and seems to be helping with symptoms. She is noticing increase in sinus symptoms with high pollen count . Itchy and watery eyes, rhinorrhea and congestion   History of bladder polyps and cystoscopy many years ago.  She is having urinary  frequency and history of microscopic hematuria. She has nocturia but chronic . She saw Dr. Orson Slick many years ago I am placing a referral to  [facility] - she would like to see Carollee Herter since she worked with him in the past    Myasthenia Gravis: diagnosed at [facility] by Dr. Georgina Pillion  09/2015, seronegative, only causing ptosis and double vision, no other symptoms. She had a negative chest CT for thymoma, and has started on medication ( Mestinon ) She is now seeing Dr. Allena Katz, [facility].  Stable     Abnormal CT thyroid/multinodular goiter  incidental finding of thyroid calcification of left lobe, she saw Endo and  had negative biopsy.  She had a repeat US done 09/2021 , under the care of Dr. Quintin Alto in [facility]    Bariatric Surgery/Obesity: she had bariatric surgery back in 2014 , her weight was almost 300 lbs, went down to close to 200lbs, but was  gradually gaining weight,she was started on Trulicity 02/2017 and lost 15 lbs , but is gaining it back, she was 241 lbs July 2020 and we started [place], she had lost 11 lbs on the medication ( 229 lbs ), but ran out of medication and gained it back,  we gave a new rx Feb 2021 she titrated dose up, but there a gap with PA and she had to re-start medication April  2021 ,weight went from  233 lbs to 229 8 lbs, but gradually gaining weight again, weight was to 246 lbs, it went up to  261 lbs  we resumed Trulicity Dec 2022 and weight went down to 254 lbs we tried to adjust dose to 1.5 mg however the cost went up and she had a gap on taking medication, currently on Monjaro since July 2024 and her weight was 250 lbs today is down to 230.6 lbs.   OSA: she is compliant with CPAP  Vitamin D deficiency: she is taking vitamin D    B12 deficiency: advised to resume B12 SL but only a few times week, 1000  mcg   Polyarthralgia Fall 2024, seen by me early 2025, she had not responded to prednisone taper but is now seeing Rheumatologist possible PMR versus psoriatic arthritis. She is currently taking prednisone 20 mg and will have a follow up with Rheumatologist , she is finally starting to feel better, pain seems to be better controlled.     Patient Active Problem List   Diagnosis Date Noted   Fatigue 03/24/2023   Carpal tunnel syndrome, right upper limb 08/19/2019   Carpal tunnel syndrome, left upper limb 06/08/2019   Multinodular goiter 11/25/2018   Essential hypertension 11/11/2018   DOE (dyspnea on exertion) 04/15/2018   Hyperlipidemia 12/09/2017   Diaphoresis 11/26/2017   Special screening for malignant neoplasms, colon    Epiretinal membrane (ERM) of left eye  09/09/2016   Glaucoma, left eye 09/05/2016   Seronegative myasthenia gravis (HCC) 08/08/2015   Psoriasis 07/06/2015   Stress incontinence 07/06/2015   Chronic tension-type headache, intractable 05/29/2015   Anxiety and depression 04/12/2015   Asthma, mild intermittent 04/12/2015   Bladder polyp 04/12/2015   Carpal tunnel syndrome 04/12/2015   Binocular vision disorder with diplopia 04/12/2015   Dyslipidemia 04/12/2015   H/O: HTN (hypertension) 04/12/2015   Hemorrhoids, internal 04/12/2015   Neuropathy 04/12/2015   Nuclear sclerotic cataract 04/12/2015   Morbid obesity (HCC) 04/12/2015   Obstructive apnea 04/12/2015   Perennial allergic rhinitis 04/12/2015   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/12/2015   Type 2 diabetes, controlled, with neuropathy (HCC) 04/12/2015   Vitamin D deficiency 04/12/2015   Cephalalgia 08/16/2014   Bariatric surgery status 10/12/2012    Past Surgical History:  Procedure Laterality Date   ABDOMINAL HYSTERECTOMY  1991   BARIATRIC SURGERY  2014   CARPAL TUNNEL RELEASE Left 06/08/2019   Procedure: LEFT CARPAL TUNNEL RELEASE;  Surgeon: Kathryne Hitch, MD;  Location: [facility];  Service: Orthopedics;  Laterality: Left;   CARPAL TUNNEL RELEASE Right 08/19/2019   Procedure: RIGHT CARPAL TUNNEL RELEASE;  Surgeon: Kathryne Hitch, MD;  Location: [facility];  Service: Orthopedics;  Laterality: Right;   CATARACT EXTRACTION W/PHACO Left 01/28/2017   Procedure: CATARACT EXTRACTION PHACO AND INTRAOCULAR LENS PLACEMENT (IOC);  Surgeon: Galen Manila, MD;  Location: [facility];  Service: Ophthalmology;  Laterality: Left;  [place] 00:37 AP% 17.8 CDE 6.61 Fluid pack lot # [id number]   CATARACT EXTRACTION W/PHACO Right 05/20/2023   Procedure: CATARACT EXTRACTION PHACO AND INTRAOCULAR LENS PLACEMENT (IOC) RIGHT DIABETIC  7.35  00:46.2;  Surgeon: Galen Manila, MD;  Location: [facility];  Service:  Ophthalmology;  Laterality: Right;   COLONOSCOPY  2012   COLONOSCOPY WITH PROPOFOL N/A 09/30/2016   Procedure: COLONOSCOPY WITH PROPOFOL;  Surgeon: Midge Minium, MD;  Location: [facility];  Service: Endoscopy;  Laterality: N/A;  diabetic - diet controlled sleep apnea   KNEE SURGERY Right 1979-1980   OOPHORECTOMY     PARS PLANA VITRECTOMY Left 03/27/2016   Procedure: PARS PLANA VITRECTOMY WITH 25 GAUGE AND MEMBRANE PEEL, INTERNAL LIMITING MEMBRANE;  Surgeon: Edmon Crape, MD;  Location: [facility];  Service: Ophthalmology;  Laterality: Left;   THYROID SURGERY     BX done was benign   UPPER GI ENDOSCOPY     VITRECTOMY Left 04/27/2016    Family History  Problem Relation Age of Onset   Diabetes Mother    Hypertension Mother    Heart disease Mother 28       stent x 1    Migraines Sister  Stroke Maternal Grandfather    Diabetes Paternal Grandfather    Muscular dystrophy Sister    Heart attack Maternal Aunt    Breast cancer Neg Hx    Thyroid disease Neg Hx     Social History   Tobacco Use   Smoking status: Former    Current packs/day: 0.00    Average packs/day: 0.5 packs/day for 3.0 years (1.5 ttl pk-yrs)    Types: Cigarettes    Start date: 06/24/1972    Quit date: 06/25/1975    Years since quitting: 48.2   Smokeless tobacco: Never   Tobacco comments:    smoked as teenager  Substance Use Topics   Alcohol use: No    Alcohol/week: 0.0 standard drinks of alcohol     Current Outpatient Medications:    acetaminophen (TYLENOL) 500 MG tablet, Take 1,000 mg by mouth every 6 (six) hours as needed for mild pain. Reported on 07/06/2015, Disp: , Rfl:    albuterol (VENTOLIN HFA) 108 (90 Base) MCG/ACT inhaler, INHALE 2 PUFFS BY MOUTH EVERY 4 HOURS AS NEEDED FOR WHEEZING OR SHORTNESS OF BREATH., Disp: 18 g, Rfl: 0   budesonide (RHINOCORT AQUA) 32 MCG/ACT nasal spray, Place 2 sprays into both nostrils at bedtime., Disp: 15 mL, Rfl: 2   cholecalciferol (VITAMIN D3) 25 MCG (1000 UNIT)  tablet, Take 1,000 Units by mouth daily., Disp: , Rfl:    Coenzyme Q10 (CO Q 10) 100 MG CAPS, Take 1 capsule by mouth daily., Disp: 30 capsule, Rfl: 2   cyanocobalamin (VITAMIN B12) 500 MCG tablet, Take 500 mcg by mouth 3 (three) times a week., Disp: , Rfl:    DULoxetine (CYMBALTA) 60 MG capsule, TAKE 1 CAPSULE (60 MG TOTAL) BY MOUTH DAILY., Disp: 90 capsule, Rfl: 1   fluticasone-salmeterol (ADVAIR HFA) 45-21 MCG/ACT inhaler, Inhale 2 puffs into the lungs 2 (two) times daily., Disp: 1 each, Rfl: 2   glucose blood (FREESTYLE LITE) test strip, 100 each by Other route daily. Use one strip daily for blood glucose testing, Disp: 100 each, Rfl: 12   Lancets (FREESTYLE) lancets, Use one lancet daily for blood glucose testing, Disp: 100 each, Rfl: 12   montelukast (SINGULAIR) 10 MG tablet, TAKE 1 TABLET BY MOUTH EVERYDAY AT BEDTIME, Disp: 90 tablet, Rfl: 1   Multiple Vitamins-Minerals (MULTIVITAMIN WITH MINERALS) tablet, Take 1 tablet by mouth daily., Disp: , Rfl:    Omega-3 Fatty Acids (FISH OIL) 1200 MG CAPS, Take 2 capsules by mouth daily., Disp: , Rfl:    pantoprazole (PROTONIX) 40 MG tablet, TAKE 1 TABLET BY MOUTH EVERY DAY, Disp: 90 tablet, Rfl: 0   predniSONE (DELTASONE) 10 MG tablet, Take 10 mg by mouth 2 (two) times daily with a meal., Disp: , Rfl:    pyridostigmine (MESTINON) 60 MG tablet, TAKE 1 TABLET AT 8 AM, NOON, AND AT 4 PM. OK TO TAKE EXTRA DOSE AS NEEDED., Disp: 360 tablet, Rfl: 3   telmisartan-hydrochlorothiazide (MICARDIS HCT) 80-25 MG tablet, Take 1 tablet by mouth daily., Disp: 90 tablet, Rfl: 1   tirzepatide (MOUNJARO) 7.5 MG/0.5ML Pen, Inject 7.5 mg into the skin once a week., Disp: 6 mL, Rfl: 1   tiZANidine (ZANAFLEX) 4 MG tablet, TAKE 0.5-1.5 TABLETS BY MOUTH EVERY 8 (EIGHT) HOURS AS NEEDED FOR MUSCLE SPASMS (MUSCLE TIGHTNESS), Disp: 90 tablet, Rfl: 2   traMADol (ULTRAM) 50 MG tablet, Take 1 tablet by mouth 2 (two) times daily. PRN, Disp: , Rfl:   Allergies  Allergen Reactions    Aspirin Swelling  Upset stomach  Upset stomach    Azithromycin     Contraindicated in Myasthenia    Ace Inhibitors     cough   Amlodipine     Edema of feet and hands   Gabapentin     anxiety   Pregabalin Other (See Comments)    anxiety    I personally reviewed active problem list, medication list, allergies, family history with the patient/caregiver today.   ROS  Ten systems reviewed and is negative except as mentioned in HPI    Objective  Vitals:   09/12/23 1426  BP: 126/74  Pulse: 70  Resp: 16  SpO2: 96%  Weight: 230 lb 9.6 oz (104.6 kg)  Height: 5\' 2"  (1.575 m)    Body mass index is 42.18 kg/m.  Physical Exam  Constitutional: Patient appears well-developed and well-nourished. Obese  No distress.  HEENT: head atraumatic, normocephalic, pupils equal and reactive to light, neck supple, Cardiovascular: Normal rate, regular rhythm and normal heart sounds.  No murmur heard. No BLE edema. Pulmonary/Chest: Effort normal and breath sounds normal. No respiratory distress. Abdominal: Soft.  There is no tenderness. Psychiatric: Patient has a normal mood and affect. behavior is normal. Judgment and thought content normal.   Recent Results (from the past 2160 hours)  Sedimentation rate     Status: Abnormal   Collection Time: 07/11/23  9:25 AM  Result Value Ref Range   Sed Rate 55 (H) 0 - 30 mm/h  C-reactive protein     Status: Abnormal   Collection Time: 07/11/23  9:25 AM  Result Value Ref Range   CRP 12.5 (H) <8.0 mg/L  CBC with Differential/Platelet     Status: Abnormal   Collection Time: 07/11/23  9:25 AM  Result Value Ref Range   WBC 12.1 (H) 3.8 - 10.8 Thousand/uL   RBC 4.25 3.80 - 5.10 Million/uL   Hemoglobin 11.9 11.7 - 15.5 g/dL   HCT 16.1 09.6 - 04.5 %   MCV 86.6 80.0 - 100.0 fL   MCH 28.0 27.0 - 33.0 pg   MCHC 32.3 32.0 - 36.0 g/dL    Comment: For adults, a slight decrease in the calculated MCHC value (in the range of 30 to 32 g/dL) is most  likely not clinically significant; however, it should be interpreted with caution in correlation with other red cell parameters and the patient's clinical condition.    RDW 13.7 11.0 - 15.0 %   Platelets 385 140 - 400 Thousand/uL   MPV 10.3 7.5 - 12.5 fL   Neutro Abs 7,611 1,500 - 7,800 cells/uL   Absolute Lymphocytes 3,352 850 - 3,900 cells/uL   Absolute Monocytes 932 200 - 950 cells/uL   Eosinophils Absolute 169 15 - 500 cells/uL   Basophils Absolute 36 0 - 200 cells/uL   Neutrophils Relative % 62.9 %   Total Lymphocyte 27.7 %   Monocytes Relative 7.7 %   Eosinophils Relative 1.4 %   Basophils Relative 0.3 %  COMPLETE METABOLIC PANEL WITH GFR     Status: None   Collection Time: 07/11/23  9:25 AM  Result Value Ref Range   Glucose, Bld 83 65 - 99 mg/dL    Comment: .            Fasting reference interval .    BUN 19 7 - 25 mg/dL   Creat 4.09 8.11 - 9.14 mg/dL   eGFR 88 > OR = 60 NW/GNF/6.21H0   BUN/Creatinine Ratio SEE NOTE: 6 - 22 (calc)  Comment:    Not Reported: BUN and Creatinine are within    reference range. .    Sodium 140 135 - 146 mmol/L   Potassium 4.1 3.5 - 5.3 mmol/L   Chloride 104 98 - 110 mmol/L   CO2 28 20 - 32 mmol/L   Calcium 9.3 8.6 - 10.4 mg/dL   Total Protein 6.3 6.1 - 8.1 g/dL   Albumin 3.8 3.6 - 5.1 g/dL   Globulin 2.5 1.9 - 3.7 g/dL (calc)   AG Ratio 1.5 1.0 - 2.5 (calc)   Total Bilirubin 0.3 0.2 - 1.2 mg/dL   Alkaline phosphatase (APISO) 49 37 - 153 U/L   AST 16 10 - 35 U/L   ALT 13 6 - 29 U/L  ANA,IFA RA Diag Pnl w/rflx Tit/Patn     Status: Abnormal   Collection Time: 07/11/23  9:25 AM  Result Value Ref Range   Anti Nuclear Antibody (ANA) POSITIVE (A) NEGATIVE    Comment: ANA IFA is a first line screen for detecting the presence of up to approximately 150 autoantibodies in various autoimmune diseases. A positive ANA IFA result is suggestive of autoimmune disease and reflexes to titer and pattern. Further laboratory testing may  be considered if clinically indicated. . For additional information, please refer to http://education.[facility].com/faq/FAQ177 (This link is being provided for informational/ educational purposes only.) .    Rheumatoid fact SerPl-aCnc <10 <14 IU/mL   Cyclic Citrullin Peptide Ab <56 UNITS    Comment: Reference Range Negative:            <20 Weak Positive:       20-39 Moderate Positive:   40-59 Strong Positive:     >59 .    INTERPRETATION      Comment: . A positive ANA, IFA indicates that one or more  antibodies associated with connective tissue disease could be positive. The RF and CCP assays are each  65-70% sensitive for established rheumatoid arthritis.  While it is still possible that this patient has  rheumatoid arthritis, other connective tissue  diseases should be considered.  .   CK     Status: None   Collection Time: 07/11/23  9:25 AM  Result Value Ref Range   Total CK 46 20 - 243 U/L  Anti-nuclear ab-titer (ANA titer)     Status: Abnormal   Collection Time: 07/11/23  9:25 AM  Result Value Ref Range   ANA Titer 1 1:160 (H) titer    Comment:                 Reference Range                 <1:40        Negative                 1:40-1:80    Low Antibody Level                 >1:80        Elevated Antibody Level .    ANA Pattern 1 Nuclear, Speckled (A)     Comment: Speckled pattern is associated with mixed connective tissue disease (MCTD), systemic lupus erythematosus (SLE), Sjogren's syndrome, dermatomyositis, and  systemic sclerosis/polymyositis overlap. . AC-2,4,5,29: Speckled . International Consensus on ANA Patterns ([email or web address])   Hemoglobin A1c     Status: None   Collection Time: 08/28/23 12:00 AM  Result Value Ref Range   Hemoglobin A1C 5.8     Comment: [facility]  Diabetic Foot Exam - Simple   Simple Foot Form Visual Inspection No deformities, no ulcerations, no other skin breakdown bilaterally:  Yes Sensation Testing Intact to touch and monofilament testing bilaterally: Yes Pulse Check Posterior Tibialis and Dorsalis pulse intact bilaterally: Yes Comments      PHQ2/9:    09/12/2023    2:25 PM 08/08/2023   11:02 AM 07/25/2023    8:22 AM 07/11/2023    7:57 AM 05/14/2023   11:35 AM  Depression screen PHQ 2/9  Decreased Interest 0 0 0 0 0  Down, Depressed, Hopeless 0 0 0 0 0  PHQ - 2 Score 0 0 0 0 0  Altered sleeping 0 0 0 0 0  Tired, decreased energy 0 0 0 0 0  Change in appetite 0 0 0 0 0  Feeling bad or failure about yourself  0 0 0 0 0  Trouble concentrating 0 0 0 0 0  Moving slowly or fidgety/restless 0 0 0 0 0  Suicidal thoughts 0 0 0 0 0  PHQ-9 Score 0 0 0 0 0  Difficult doing work/chores Not difficult at all Not difficult at all Not difficult at all Not difficult at all     phq 9 is negative  Fall Risk:    09/12/2023    2:14 PM 07/11/2023    7:57 AM 05/14/2023   11:35 AM 04/28/2023    8:09 AM 04/15/2023    2:14 PM  Fall Risk   Falls in the past year? 0 0 0 0 0  Number falls in past yr: 0 0  0 0  Injury with Fall? 0 0  0 0  Risk for fall due to : No Fall Risks No Fall Risks No Fall Risks No Fall Risks   Follow up Falls prevention discussed;Education provided;Falls evaluation completed Falls prevention discussed;Education provided;Falls evaluation completed Falls prevention discussed Falls prevention discussed;Education provided;Falls evaluation completed Falls evaluation completed     Assessment & Plan  1. Type 2 diabetes, controlled, with neuropathy (HCC) (Primary)  - [facility] Diabetes Foot Exam  2. Morbid obesity (HCC)  Discussed with the patient the risk posed by an increased BMI. Discussed importance of portion control, calorie counting and at least 150 minutes of physical activity weekly. Avoid sweet beverages and drink more water. Eat at least 6 servings of fruit and vegetables daily    3. Seronegative myasthenia gravis (HCC)  Under the care of  neurologist   4. History of bariatric surgery  Doing better on GLP-1 agonist  5. Polyarthralgia  Seeing Rheumatologist   6. Multinodular goiter  Up to date with follow up with Endo   7. B12 deficiency  Continue supplementation   8. OSA on CPAP  compliant  9. Hypertension, benign  At goal   10. Perennial allergic rhinitis  Continue medication  11. Urinary frequency  - Ambulatory referral to Urology

## 2023-09-26 ENCOUNTER — Encounter: Payer: Self-pay | Admitting: Family Medicine

## 2023-10-08 ENCOUNTER — Telehealth: Payer: Self-pay | Admitting: Family Medicine

## 2023-10-08 ENCOUNTER — Ambulatory Visit: Admitting: *Deleted

## 2023-10-08 VITALS — BP 129/65 | Ht 62.0 in | Wt 229.0 lb

## 2023-10-08 DIAGNOSIS — Z0000 Encounter for general adult medical examination without abnormal findings: Secondary | ICD-10-CM | POA: Diagnosis not present

## 2023-10-08 NOTE — Telephone Encounter (Signed)
 Please contact pt to reschedule her AWV. She was on our schedule for today but the nurse had no idea that she was on there. I have cancelled the appointment.

## 2023-10-08 NOTE — Progress Notes (Signed)
 Subjective:   Jasmine Woods is a 68 y.o. who presents for a Medicare Wellness preventive visit.  Visit Complete: Virtual I connected with  Jasmine Woods on 10/08/23 by a audio enabled telemedicine application and verified that I am speaking with the correct person using two identifiers.  Patient Location: Home  Provider Location: Home Office  I discussed the limitations of evaluation and management by telemedicine. The patient expressed understanding and agreed to proceed.  Vital Signs: Because this visit was a virtual/telehealth visit, some criteria may be missing or patient reported. Any vitals not documented were not able to be obtained and vitals that have been documented are patient reported.  VideoDeclined- This patient declined Librarian, academic. Therefore the visit was completed with audio only.  Persons Participating in Visit: Patient.  AWV Questionnaire: Yes: Patient Medicare AWV questionnaire was completed by the patient on 10/08/23; I have confirmed that all information answered by patient is correct and no changes since this date.  Cardiac Risk Factors include: advanced age (>35men, >72 women);diabetes mellitus;hypertension;dyslipidemia;obesity (BMI >30kg/m2)     Objective:    Today's Vitals   10/08/23 1312 10/08/23 1313  BP: 129/65   Weight: 229 lb (103.9 kg)   Height: 5\' 2"  (1.575 m)   PainSc:  3    Body mass index is 41.88 kg/m.     10/08/2023    1:26 PM 04/15/2023    2:14 PM 08/22/2022    8:49 AM 03/08/2022    2:24 PM 08/21/2021    9:01 AM 09/15/2020   10:41 AM 08/19/2019    8:47 AM  Advanced Directives  Does Patient Have a Medical Advance Directive? Yes Yes Yes Yes Yes Yes Yes  Type of Estate agent of [place];Living will Healthcare Power of [place];Living will   Healthcare Power of [place];Living will    Does patient want to make changes to medical advance directive?   Yes (ED -  Information included in AVS)    No - Patient declined  Copy of Healthcare Power of Attorney in Chart? No - copy requested    No - copy requested  No - copy requested  Would patient like information on creating a medical advance directive?       No - Patient declined    Current Medications (verified) Outpatient Encounter Medications as of 10/08/2023  Medication Sig   acetaminophen (TYLENOL) 500 MG tablet Take 1,000 mg by mouth every 6 (six) hours as needed for mild pain. Reported on 07/06/2015   albuterol (VENTOLIN HFA) 108 (90 Base) MCG/ACT inhaler INHALE 2 PUFFS BY MOUTH EVERY 4 HOURS AS NEEDED FOR WHEEZING OR SHORTNESS OF BREATH.   budesonide (RHINOCORT AQUA) 32 MCG/ACT nasal spray Place 2 sprays into both nostrils at bedtime.   cholecalciferol (VITAMIN D3) 25 MCG (1000 UNIT) tablet Take 1,000 Units by mouth daily.   Coenzyme Q10 (CO Q 10) 100 MG CAPS Take 1 capsule by mouth daily.   cyanocobalamin (VITAMIN B12) 500 MCG tablet Take 500 mcg by mouth 3 (three) times a week.   DULoxetine (CYMBALTA) 60 MG capsule TAKE 1 CAPSULE (60 MG TOTAL) BY MOUTH DAILY.   fluticasone-salmeterol (ADVAIR HFA) 45-21 MCG/ACT inhaler Inhale 2 puffs into the lungs 2 (two) times daily.   glucose blood (FREESTYLE LITE) test strip 100 each by Other route daily. Use one strip daily for blood glucose testing   Lancets (FREESTYLE) lancets Use one lancet daily for blood glucose testing   montelukast (SINGULAIR) 10 MG  tablet TAKE 1 TABLET BY MOUTH EVERYDAY AT BEDTIME   Multiple Vitamins-Minerals (MULTIVITAMIN WITH MINERALS) tablet Take 1 tablet by mouth daily.   Omega-3 Fatty Acids (FISH OIL) 1200 MG CAPS Take 2 capsules by mouth daily.   pantoprazole (PROTONIX) 40 MG tablet TAKE 1 TABLET BY MOUTH EVERY DAY   pyridostigmine (MESTINON) 60 MG tablet TAKE 1 TABLET AT 8 AM, NOON, AND AT 4 PM. OK TO TAKE EXTRA DOSE AS NEEDED.   telmisartan-hydrochlorothiazide (MICARDIS HCT) 80-25 MG tablet Take 1 tablet by mouth daily.    tirzepatide (MOUNJARO) 7.5 MG/0.5ML Pen Inject 7.5 mg into the skin once a week.   tiZANidine (ZANAFLEX) 4 MG tablet TAKE 0.5-1.5 TABLETS BY MOUTH EVERY 8 (EIGHT) HOURS AS NEEDED FOR MUSCLE SPASMS (MUSCLE TIGHTNESS)   traMADol (ULTRAM) 50 MG tablet Take 1 tablet by mouth 2 (two) times daily. PRN   No facility-administered encounter medications on file as of 10/08/2023.    Allergies (verified) Aspirin, Azithromycin, Ace inhibitors, Amlodipine, Gabapentin, and Pregabalin   History: Past Medical History:  Diagnosis Date   Allergy    Anxiety    Asthma    Bladder polyp    Dr. Orson Slick   BMI 45.0-49.9, adult [facility])    Carpal tunnel syndrome    Complication of anesthesia    needed nebulizer after surgery   Depression    Diabetes mellitus without complication (HCC)    Diet controlled   Double vision    seen by Dr. 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   Dyslipidemia    Frequency of urination    Glaucoma, left eye    H/O myasthenia gravis    MOSTLY AFFECTS EYES   HA (headache)    Heart murmur    followed by PCP   Hx of bariatric surgery    Hypertension    no meds   Internal hemorrhoids    Myasthenia gravis (HCC)    "mostly effects eyes"   Myasthenia gravis (HCC)    Neuropathy, generalized    diabetic on feet   Nuclear sclerotic cataract of both eyes    Obesity (BMI 30-39.9)    Obstructive sleep apnea on CPAP    uses cpap   OSA on CPAP    Patient is Jehovah's Witness    no blood products   Rotator cuff tendonitis 07/2009   also has impingment right shoulder pain, seen by Dr. Martha Clan   Sleep apnea    Thyroid nodule    Vaginal dryness, menopausal    Vitamin D deficiency    Past Surgical History:  Procedure Laterality Date   ABDOMINAL HYSTERECTOMY  1991   BARIATRIC SURGERY  2014   CARPAL TUNNEL RELEASE Left 06/08/2019   Procedure: LEFT CARPAL TUNNEL RELEASE;  Surgeon: Kathryne Hitch, MD;  Location: [facility];  Service: Orthopedics;   Laterality: Left;   CARPAL TUNNEL RELEASE Right 08/19/2019   Procedure: RIGHT CARPAL TUNNEL RELEASE;  Surgeon: Kathryne Hitch, MD;  Location:  SURGERY CENTER;  Service: Orthopedics;  Laterality: Right;   CATARACT EXTRACTION W/PHACO Left 01/28/2017   Procedure: CATARACT EXTRACTION PHACO AND INTRAOCULAR LENS PLACEMENT (IOC);  Surgeon: Galen Manila, MD;  Location: [facility];  Service: Ophthalmology;  Laterality: Left;  [place] 00:37 AP% 17.8 CDE 6.61 Fluid pack lot # [id number]   CATARACT EXTRACTION W/PHACO Right 05/20/2023   Procedure: CATARACT EXTRACTION PHACO AND INTRAOCULAR LENS PLACEMENT (IOC) RIGHT DIABETIC  7.35  00:46.2;  Surgeon: Galen Manila, MD;  Location: [facility];  Service:  Ophthalmology;  Laterality: Right;   COLONOSCOPY  2012   COLONOSCOPY WITH PROPOFOL N/A 09/30/2016   Procedure: COLONOSCOPY WITH PROPOFOL;  Surgeon: Marnee Sink, MD;  Location: [facility];  Service: Endoscopy;  Laterality: N/A;  diabetic - diet controlled sleep apnea   KNEE SURGERY Right 1979-1980   OOPHORECTOMY     PARS PLANA VITRECTOMY Left 03/27/2016   Procedure: PARS PLANA VITRECTOMY WITH 25 GAUGE AND MEMBRANE PEEL, INTERNAL LIMITING MEMBRANE;  Surgeon: Shon Downing, MD;  Location: [facility];  Service: Ophthalmology;  Laterality: Left;   THYROID SURGERY     BX done was benign   UPPER GI ENDOSCOPY     VITRECTOMY Left 04/27/2016   Family History  Problem Relation Age of Onset   Diabetes Mother    Hypertension Mother    Heart disease Mother 60       stent x 1    Migraines Sister    Stroke Maternal Grandfather    Diabetes Paternal Grandfather    Muscular dystrophy Sister    Heart attack Maternal Aunt    Breast cancer Neg Hx    Thyroid disease Neg Hx    Social History   Socioeconomic History   Marital status: Widowed    Spouse name: Sammie Crigler "Butch"   Number of children: 2   Years of education: 12   Highest education level: 12th grade  Occupational History    Occupation: [employer]  Tobacco Use   Smoking status: Former    Current packs/day: 0.00    Average packs/day: 0.5 packs/day for 3.0 years (1.5 ttl pk-yrs)    Types: Cigarettes    Start date: 06/24/1972    Quit date: 06/25/1975    Years since quitting: 48.3   Smokeless tobacco: Never   Tobacco comments:    smoked as teenager  Vaping Use   Vaping status: Never Used  Substance and Sexual Activity   Alcohol use: No    Alcohol/week: 0.0 standard drinks of alcohol   Drug use: No   Sexual activity: Not Currently    Partners: Male  Other Topics Concern   Not on file  Social History Narrative   Right handed   One story home   12 years education   Retired from [employer]         Husband passed away    Social Drivers of Health   Financial Resource Strain: Low Risk  (10/08/2023)   Overall Financial Resource Strain (CARDIA)    Difficulty of Paying Living Expenses: Not hard at all  Food Insecurity: No Food Insecurity (10/08/2023)   Hunger Vital Sign    Worried About Running Out of Food in the Last Year: Never true    Ran Out of Food in the Last Year: Never true  Transportation Needs: No Transportation Needs (10/08/2023)   PRAPARE - Administrator, Civil Service (Medical): No    Lack of Transportation (Non-Medical): No  Physical Activity: Inactive (10/08/2023)   Exercise Vital Sign    Days of Exercise per Week: 0 days    Minutes of Exercise per Session: 0 min  Stress: No Stress Concern Present (10/08/2023)   [employer] of Occupational Health - Occupational Stress Questionnaire    Feeling of Stress : Only a little  Social Connections: Moderately Isolated (10/08/2023)   Social Connection and Isolation Panel [NHANES]    Frequency of Communication with Friends and Family: More than three times a week    Frequency of Social Gatherings with Friends  and Family: More than three times a week    Attends Religious Services: More than 4 times per year    Active Member of  Clubs or Organizations: No    Attends Banker Meetings: Never    Marital Status: Widowed    Tobacco Counseling Counseling given: Not Answered Tobacco comments: smoked as teenager    Clinical Intake:  Pre-visit preparation completed: Yes  Pain : 0-10 Pain Score: 3  Pain Type: Chronic pain Pain Location: Hip Pain Orientation: Left Pain Descriptors / Indicators: Aching Pain Onset: More than a month ago Pain Frequency: Constant     BMI - recorded: 41.88 Nutritional Status: BMI > 30  Obese Nutritional Risks: None Diabetes: Yes CBG done?: Yes (FBS 92 per patient)  Lab Results  Component Value Date   HGBA1C 5.8 08/28/2023   HGBA1C 6.0 (A) 05/14/2023   HGBA1C 6.0 (A) 01/08/2023     How often do you need to have someone help you when you read instructions, pamphlets, or other written materials from your doctor or pharmacy?: 1 - Never  Interpreter Needed?: No  Information entered by :: R. Thaily Hackworth LPN   Activities of Daily Living     10/08/2023    9:57 AM 10/07/2023    2:39 PM  In your present state of health, do you have any difficulty performing the following activities:  Hearing? 0 0  Vision? 0 0  Difficulty concentrating or making decisions? 0 0  Walking or climbing stairs? 1 1  Dressing or bathing? 0 0  Doing errands, shopping? 0 0  Preparing Food and eating ? N N  Using the Toilet? N N  In the past six months, have you accidently leaked urine? N N  Do you have problems with loss of bowel control? N N  Managing your Medications? N N  Managing your Finances? N N  Housekeeping or managing your Housekeeping? Colie Dawes    Patient Care Team: Sowles, Krichna, MD as PCP - General (Family Medicine) Sowles, Krichna, MD as Attending Physician (Family Medicine) Daryel Ensign, DO as Consulting Physician (Neurology) Gwyndolyn Lerner, MD (Inactive) as Consulting Physician (Endocrinology) End, Veryl Gottron, MD as Consulting Physician (Cardiology) Marnee Sink, MD  as Consulting Physician (Gastroenterology) Diamond Formica, MD as Consulting Physician (Pulmonary Disease) Cordie Deters, MD as Consulting Physician (Internal Medicine) Patel, Donika K, DO as Consulting Physician (Neurology)  Indicate any recent Medical Services you may have received from other than [facility] providers in the past year (date may be approximate).     Assessment:   This is a routine wellness examination for Jasmine Woods.  Hearing/Vision screen Hearing Screening - Comments:: No issues Vision Screening - Comments:: glasses   Goals Addressed             This Visit's Progress    Patient Stated       Wants to loss some weight and have hip replaced       Depression Screen     10/08/2023    1:20 PM 09/12/2023    2:25 PM 08/08/2023   11:02 AM 07/25/2023    8:22 AM 07/11/2023    7:57 AM 05/14/2023   11:35 AM 04/28/2023    8:09 AM  PHQ 2/9 Scores  PHQ - 2 Score 0 0 0 0 0 0 0  PHQ- 9 Score 7 0 0 0 0 0 0    Fall Risk     10/08/2023    9:57 AM 10/07/2023    2:39 PM  09/12/2023    2:14 PM 07/11/2023    7:57 AM 05/14/2023   11:35 AM  Fall Risk   Falls in the past year? 0 0 0 0 0  Number falls in past yr: 0  0 0   Injury with Fall? 0  0 0   Risk for fall due to : No Fall Risks  No Fall Risks No Fall Risks No Fall Risks  Follow up Falls prevention discussed;Falls evaluation completed  Falls prevention discussed;Education provided;Falls evaluation completed Falls prevention discussed;Education provided;Falls evaluation completed Falls prevention discussed    MEDICARE RISK AT HOME:  Medicare Risk at Home Any stairs in or around the home?: (Patient-Rptd) No If so, are there any without handrails?: No Home free of loose throw rugs in walkways, pet beds, electrical cords, etc?: (Patient-Rptd) Yes Adequate lighting in your home to reduce risk of falls?: (Patient-Rptd) Yes Life alert?: (Patient-Rptd) No Use of a cane, walker or w/c?: (Patient-Rptd) Yes Grab bars in the bathroom?:  (Patient-Rptd) Yes Shower chair or bench in shower?: (Patient-Rptd) Yes Elevated toilet seat or a handicapped toilet?: (Patient-Rptd) Yes  TIMED UP AND GO:  Was the test performed?  No  Cognitive Function: 6CIT completed        10/08/2023    1:26 PM 08/22/2022    8:51 AM  6CIT Screen  What Year? 0 points 0 points  What month? 0 points 0 points  What time? 0 points 0 points  Count back from 20 0 points 0 points  Months in reverse 0 points 0 points  Repeat phrase 0 points 0 points  Total Score 0 points 0 points    Immunizations Immunization History  Administered Date(s) Administered   Fluad Quad(high Dose 65+) 03/15/2022   Fluad Trivalent(High Dose 65+) 03/19/2023   Influenza, Quadrivalent, Recombinant, Inj, Pf 03/24/2019   Influenza,inj,Quad PF,6+ Mos 03/22/2020   Influenza-Unspecified 04/06/2015, 04/04/2016, 04/03/2017, 03/18/2018, 03/30/2021   Moderna Covid-19 Fall Seasonal Vaccine 60yrs & older 03/19/2023   PFIZER Comirnaty(Gray Top)Covid-19 Tri-Sucrose Vaccine 10/17/2020, 03/15/2022   PFIZER(Purple Top)SARS-COV-2 Vaccination 07/09/2019, 07/30/2019, 04/07/2020, 10/17/2020, 03/30/2021   PNEUMOCOCCAL CONJUGATE-20 07/12/2022   Pneumococcal Conjugate-13 07/06/2015, 07/31/2020   Pneumococcal Polysaccharide-23 12/19/2011, 03/18/2017   Tdap 10/24/2005, 12/15/2015   Zoster Recombinant(Shingrix) 11/03/2019, 03/29/2020   Zoster, Live 03/25/2011    Screening Tests Health Maintenance  Topic Date Due   COVID-19 Vaccine (9 - 2024-25 season) 09/16/2023   Diabetic kidney evaluation - Urine ACR  01/08/2024   INFLUENZA VACCINE  01/23/2024   HEMOGLOBIN A1C  02/28/2024   OPHTHALMOLOGY EXAM  04/16/2024   Diabetic kidney evaluation - eGFR measurement  07/10/2024   FOOT EXAM  09/11/2024   Medicare Annual Wellness (AWV)  10/07/2024   MAMMOGRAM  02/26/2025   DTaP/Tdap/Td (3 - Td or Tdap) 12/14/2025   Colonoscopy  10/01/2026   Pneumonia Vaccine 75+ Years old  Completed   DEXA SCAN   Completed   Hepatitis C Screening  Completed   Zoster Vaccines- Shingrix  Completed   HPV VACCINES  Aged Out   Meningococcal B Vaccine  Aged Out    Health Maintenance  Health Maintenance Due  Topic Date Due   COVID-19 Vaccine (9 - 2024-25 season) 09/16/2023   Health Maintenance Items Addressed: Patient stated that she is scheduled to get her updated Covid vaccine  Additional Screening:  Vision Screening: Recommended annual ophthalmology exams for early detection of glaucoma and other disorders of the eye.  Dental Screening: Recommended annual dental exams for proper oral hygiene  Community  Resource Referral / Chronic Care Management: CRR required this visit?  No   CCM required this visit?  No     Plan:     I have personally reviewed and noted the following in the patient's chart:   Medical and social history Use of alcohol, tobacco or illicit drugs  Current medications and supplements including opioid prescriptions. Patient is currently taking opioid prescriptions. Information provided to patient regarding non-opioid alternatives. Patient advised to discuss non-opioid treatment plan with their provider. Functional ability and status Nutritional status Physical activity Advanced directives List of other physicians Hospitalizations, surgeries, and ER visits in previous 12 months Vitals Screenings to include cognitive, depression, and falls Referrals and appointments  In addition, I have reviewed and discussed with patient certain preventive protocols, quality metrics, and best practice recommendations. A written personalized care plan for preventive services as well as general preventive health recommendations were provided to patient.     Felicitas Horse, LPN   [id number]   After Visit Summary: (MyChart) Due to this being a telephonic visit, the after visit summary with patients personalized plan was offered to patient via MyChart   Notes: Nothing significant to  report at this time.

## 2023-10-08 NOTE — Patient Instructions (Signed)
 Jasmine Woods , Thank you for taking time to come for your Medicare Wellness Visit. I appreciate your ongoing commitment to your health goals. Please review the following plan we discussed and let me know if I can assist you in the future.   Referrals/Orders/Follow-Ups/Clinician Recommendations: Remember to get your covid vaccine  This is a list of the screening recommended for you and due dates:  Health Maintenance  Topic Date Due   COVID-19 Vaccine (9 - 2024-25 season) 09/16/2023   Yearly kidney health urinalysis for diabetes  01/08/2024   Flu Shot  01/23/2024   Hemoglobin A1C  02/28/2024   Eye exam for diabetics  04/16/2024   Yearly kidney function blood test for diabetes  07/10/2024   Complete foot exam   09/11/2024   Medicare Annual Wellness Visit  10/07/2024   Mammogram  02/26/2025   DTaP/Tdap/Td vaccine (3 - Td or Tdap) 12/14/2025   Colon Cancer Screening  10/01/2026   Pneumonia Vaccine  Completed   DEXA scan (bone density measurement)  Completed   Hepatitis C Screening  Completed   Zoster (Shingles) Vaccine  Completed   HPV Vaccine  Aged Out   Meningitis B Vaccine  Aged Out    Advanced directives: (Copy Requested) Please bring a copy of your health care power of attorney and living will to the office to be added to your chart at your convenience. You can mail to [facility] [address] [place], [place] [address] or email to [email or web address]  Next Medicare Annual Wellness Visit scheduled for next year: Yes 10/11/24 @ 1:00  Managing Pain Without Opioids Opioids are strong medicines used to treat moderate to severe pain. For some people, especially those who have long-term (chronic) pain, opioids may not be the best choice for pain management due to: Side effects like nausea, constipation, and sleepiness. The risk of addiction (opioid use disorder). The longer you take opioids, the greater your risk of addiction. Pain that lasts for more than 3  months is called chronic pain. Managing chronic pain usually requires more than one approach and is often provided by a team of health care providers working together (multidisciplinary approach). Pain management may be done at a pain management center or pain clinic. How to manage pain without the use of opioids Use non-opioid medicines Non-opioid medicines for pain may include: Over-the-counter or prescription non-steroidal anti-inflammatory drugs (NSAIDs). These may be the first medicines used for pain. They work well for muscle and bone pain, and they reduce swelling. Acetaminophen. This over-the-counter medicine may work well for milder pain but not swelling. Antidepressants. These may be used to treat chronic pain. A certain type of antidepressant (tricyclics) is often used. These medicines are given in lower doses for pain than when used for depression. Anticonvulsants. These are usually used to treat seizures but may also reduce nerve (neuropathic) pain. Muscle relaxants. These relieve pain caused by sudden muscle tightening (spasms). You may also use a pain medicine that is applied to the skin as a patch, cream, or gel (topical analgesic), such as a numbing medicine. These may cause fewer side effects than medicines taken by mouth. Do certain therapies as directed Some therapies can help with pain management. They include: Physical therapy. You will do exercises to gain strength and flexibility. A physical therapist may teach you exercises to move and stretch parts of your body that are weak, stiff, or painful. You can learn these exercises at physical therapy visits and practice them at home.  Physical therapy may also involve: Massage. Heat wraps or applying heat or cold to affected areas. Electrical signals that interrupt pain signals (transcutaneous electrical nerve stimulation, TENS). Weak lasers that reduce pain and swelling (low-level laser therapy). Signals from your body that help  you learn to regulate pain (biofeedback). Occupational therapy. This helps you to learn ways to function at home and work with less pain. Recreational therapy. This involves trying new activities or hobbies, such as a physical activity or drawing. Mental health therapy, including: Cognitive behavioral therapy (CBT). This helps you learn coping skills for dealing with pain. Acceptance and commitment therapy (ACT) to change the way you think and react to pain. Relaxation therapies, including muscle relaxation exercises and mindfulness-based stress reduction. Pain management counseling. This may be individual, family, or group counseling.  Receive medical treatments Medical treatments for pain management include: Nerve block injections. These may include a pain blocker and anti-inflammatory medicines. You may have injections: Near the spine to relieve chronic back or neck pain. Into joints to relieve back or joint pain. Into nerve areas that supply a painful area to relieve body pain. Into muscles (trigger point injections) to relieve some painful muscle conditions. A medical device placed near your spine to help block pain signals and relieve nerve pain or chronic back pain (spinal cord stimulation device). Acupuncture. Follow these instructions at home Medicines Take over-the-counter and prescription medicines only as told by your health care provider. If you are taking pain medicine, ask your health care providers about possible side effects to watch out for. Do not drive or use heavy machinery while taking prescription opioid pain medicine. Lifestyle  Do not use drugs or alcohol to reduce pain. If you drink alcohol, limit how much you have to: 0-1 drink a day for women who are not pregnant. 0-2 drinks a day for men. Know how much alcohol is in a drink. In the [place] one drink equals one 12 oz bottle of beer (355 mL), one 5 oz glass of wine (148 mL), or one 1 oz glass of hard liquor (44  mL). Do not use any products that contain nicotine or tobacco. These products include cigarettes, chewing tobacco, and vaping devices, such as e-cigarettes. If you need help quitting, ask your health care provider. Eat a healthy diet and maintain a healthy weight. Poor diet and excess weight may make pain worse. Eat foods that are high in fiber. These include fresh fruits and vegetables, whole grains, and beans. Limit foods that are high in fat and processed sugars, such as fried and sweet foods. Exercise regularly. Exercise lowers stress and may help relieve pain. Ask your health care provider what activities and exercises are safe for you. If your health care provider approves, join an exercise class that combines movement and stress reduction. Examples include yoga and tai chi. Get enough sleep. Lack of sleep may make pain worse. Lower stress as much as possible. Practice stress reduction techniques as told by your therapist. General instructions Work with all your pain management providers to find the treatments that work best for you. You are an important member of your pain management team. There are many things you can do to reduce pain on your own. Consider joining an online or in-person support group for people who have chronic pain. Keep all follow-up visits. This is important. Where to find more information You can find more information about managing pain without opioids from: American Academy of Pain Medicine: painmed.org Institute for Chronic Pain: instituteforchronicpain.org  American Chronic Pain Association: theacpa.org Contact a health care provider if: You have side effects from pain medicine. Your pain gets worse or does not get better with treatments or home therapy. You are struggling with anxiety or depression. Summary Many types of pain can be managed without opioids. Chronic pain may respond better to pain management without opioids. Pain is best managed when you and  a team of health care providers work together. Pain management without opioids may include non-opioid medicines, medical treatments, physical therapy, mental health therapy, and lifestyle changes. Tell your health care providers if your pain gets worse or is not being managed well enough. This information is not intended to replace advice given to you by your health care provider. Make sure you discuss any questions you have with your health care provider. Document Revised: 09/20/2020 Document Reviewed: 09/20/2020  Elsevier Patient Education  2024 [employer].

## 2023-10-23 NOTE — Telephone Encounter (Signed)
Visit already completed.

## 2023-10-29 ENCOUNTER — Encounter: Payer: Self-pay | Admitting: Urology

## 2023-10-29 ENCOUNTER — Ambulatory Visit (INDEPENDENT_AMBULATORY_CARE_PROVIDER_SITE_OTHER): Admitting: Urology

## 2023-10-29 VITALS — BP 115/71 | HR 103 | Ht 65.0 in | Wt 229.0 lb

## 2023-10-29 DIAGNOSIS — R3129 Other microscopic hematuria: Secondary | ICD-10-CM

## 2023-10-29 DIAGNOSIS — R350 Frequency of micturition: Secondary | ICD-10-CM | POA: Diagnosis not present

## 2023-10-29 LAB — MICROSCOPIC EXAMINATION: Epithelial Cells (non renal): >10 /HPF — AB (ref 0–10)

## 2023-10-29 LAB — URINALYSIS, COMPLETE
Bilirubin, UA: NEGATIVE
Glucose, UA: NEGATIVE
Ketones, UA: NEGATIVE
Leukocytes,UA: NEGATIVE
Nitrite, UA: NEGATIVE
Specific Gravity, UA: 1.025 (ref 1.005–1.030)
Urobilinogen, Ur: 0.2 mg/dL (ref 0.2–1.0)
pH, UA: 6 (ref 5.0–7.5)

## 2023-10-29 LAB — BLADDER SCAN AMB NON-IMAGING: Scan Result: 0

## 2023-10-29 NOTE — Progress Notes (Signed)
 I, Jasmine Woods, acting as a scribe for Geraline Knapp, MD., have documented all relevant documentation on the behalf of Geraline Knapp, MD, as directed by Geraline Knapp, MD while in the presence of Geraline Knapp, MD.  10/29/2023 3:36 PM   Jasmine Woods [birth year] [id number]  Referring provider: Arleen Lacer, MD [address] [place],  [place] [address]  Chief Complaint  Patient presents with   Urinary Frequency    HPI: Jasmine Woods is a 68 y.o. female referred for urinary frequency.  States she was seen by her rheumatologist in June 2025 and was noted to have blood in her urine. She denies gross hematuria. Had seen Dr. Al Alias in the past for lower urinary tract symptoms and microhematuria. She states she was found to have polyps on cystoscopy, but no tumor.  Her UA on 08/28/2023 showed 6 WBC and 11 RBCs on microscopy with no significant squamous epithelial cells.  Prior urinalysis March 2025 with 11 RBCs. UA today 3-10 RBCs, however there are significant squamous epithelial cells present.   PMH: Past Medical History:  Diagnosis Date   Allergy    Anxiety    Asthma    Bladder polyp    Dr. Al Alias   BMI 45.0-49.9, adult [facility])    Carpal tunnel syndrome    Complication of anesthesia    needed nebulizer after surgery   Depression    Diabetes mellitus without complication (HCC)    Diet controlled   Double vision    seen by Dr. 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   Dyslipidemia    Frequency of urination    Glaucoma, left eye    H/O myasthenia gravis    MOSTLY AFFECTS EYES   HA (headache)    Heart murmur    followed by PCP   Hx of bariatric surgery    Hypertension    no meds   Internal hemorrhoids    Myasthenia gravis (HCC)    "mostly effects eyes"   Myasthenia gravis (HCC)    Neuropathy, generalized    diabetic on feet   Nuclear sclerotic cataract of both eyes    Obesity (BMI 30-39.9)    Obstructive sleep apnea  on CPAP    uses cpap   OSA on CPAP    Patient is Jehovah's Witness    no blood products   Rotator cuff tendonitis 07/2009   also has impingment right shoulder pain, seen by Dr. Alan All   Sleep apnea    Thyroid  nodule    Vaginal dryness, menopausal    Vitamin D  deficiency     Surgical History: Past Surgical History:  Procedure Laterality Date   ABDOMINAL HYSTERECTOMY  1991   BARIATRIC SURGERY  2014   CARPAL TUNNEL RELEASE Left 06/08/2019   Procedure: LEFT CARPAL TUNNEL RELEASE;  Surgeon: Arnie Lao, MD;  Location: [facility];  Service: Orthopedics;  Laterality: Left;   CARPAL TUNNEL RELEASE Right 08/19/2019   Procedure: RIGHT CARPAL TUNNEL RELEASE;  Surgeon: Arnie Lao, MD;  Location: [facility];  Service: Orthopedics;  Laterality: Right;   CATARACT EXTRACTION W/PHACO Left 01/28/2017   Procedure: CATARACT EXTRACTION PHACO AND INTRAOCULAR LENS PLACEMENT (IOC);  Surgeon: Clair Crews, MD;  Location: [facility];  Service: Ophthalmology;  Laterality: Left;  US  00:37 AP% 17.8 CDE 6.61 Fluid pack lot # [id number]   CATARACT EXTRACTION W/PHACO Right 05/20/2023   Procedure: CATARACT EXTRACTION PHACO AND INTRAOCULAR LENS PLACEMENT (IOC) RIGHT  DIABETIC  7.35  00:46.2;  Surgeon: Clair Crews, MD;  Location: [facility];  Service: Ophthalmology;  Laterality: Right;   COLONOSCOPY  2012   COLONOSCOPY WITH PROPOFOL  N/A 09/30/2016   Procedure: COLONOSCOPY WITH PROPOFOL ;  Surgeon: Marnee Sink, MD;  [facility]: [facility];  Service: Endoscopy;  Laterality: N/A;  diabetic - diet controlled sleep apnea   KNEE SURGERY Right 1979-1980   OOPHORECTOMY     PARS PLANA VITRECTOMY Left 03/27/2016   Procedure: PARS PLANA VITRECTOMY WITH 25 GAUGE AND MEMBRANE PEEL, INTERNAL LIMITING MEMBRANE;  Surgeon: Shon Downing, MD;  Location: [facility];  Service: Ophthalmology;  Laterality: Left;   THYROID  SURGERY     BX done was benign   UPPER  GI ENDOSCOPY     VITRECTOMY Left 04/27/2016    Home Medications:  Allergies as of 10/29/2023       Reactions   Aspirin  Swelling   Upset stomach  Upset stomach    Azithromycin    Contraindicated in Myasthenia    Ace Inhibitors    cough   Amlodipine    Edema of feet and hands   Gabapentin    anxiety   Pregabalin Other (See Comments)   anxiety        Medication List        Accurate as of Oct 29, 2023  3:36 PM. If you have any questions, ask your nurse or doctor.          STOP taking these medications    pantoprazole  40 MG tablet Commonly known as: PROTONIX  Stopped by: Geraline Knapp       TAKE these medications    acetaminophen  500 MG tablet Commonly known as: TYLENOL  Take 1,000 mg by mouth every 6 (six) hours as needed for mild pain. Reported on 07/06/2015   albuterol  108 (90 Base) MCG/ACT inhaler Commonly known as: VENTOLIN  HFA INHALE 2 PUFFS BY MOUTH EVERY 4 HOURS AS NEEDED FOR WHEEZING OR SHORTNESS OF BREATH.   budesonide  32 MCG/ACT nasal spray Commonly known as: RHINOCORT  AQUA Place 2 sprays into both nostrils at bedtime.   cholecalciferol 25 MCG (1000 UNIT) tablet Commonly known as: VITAMIN D3 Take 1,000 Units by mouth daily.   Co Q 10 100 MG Caps Take 1 capsule by mouth daily.   cyanocobalamin  500 MCG tablet Commonly known as: VITAMIN B12 Take 500 mcg by mouth 3 (three) times a week.   DULoxetine  60 MG capsule Commonly known as: CYMBALTA  TAKE 1 CAPSULE (60 MG TOTAL) BY MOUTH DAILY.   Fish Oil 1200 MG Caps Take 2 capsules by mouth daily.   fluticasone -salmeterol 45-21 MCG/ACT inhaler Commonly known as: Advair  HFA Inhale 2 puffs into the lungs 2 (two) times daily.   folic acid  1 MG tablet Commonly known as: FOLVITE  Take 1 mg by mouth.   freestyle lancets Use one lancet daily for blood glucose testing   FREESTYLE LITE test strip Generic drug: glucose blood 100 each by Other route daily. Use one strip daily for blood glucose  testing   montelukast  10 MG tablet Commonly known as: SINGULAIR  TAKE 1 TABLET BY MOUTH EVERYDAY AT BEDTIME   multivitamin with minerals tablet Take 1 tablet by mouth daily.   predniSONE  5 MG tablet Commonly known as: DELTASONE  Take by mouth.   pyridostigmine  60 MG tablet Commonly known as: MESTINON  TAKE 1 TABLET AT 8 AM, NOON, AND AT 4 PM. OK TO TAKE EXTRA DOSE AS NEEDED.   telmisartan -hydrochlorothiazide 80-25 MG tablet Commonly known as:  MICARDIS  HCT Take 1 tablet by mouth daily.   tirzepatide  7.5 MG/0.5ML Pen Commonly known as: MOUNJARO  Inject 7.5 mg into the skin once a week.   tiZANidine  4 MG tablet Commonly known as: ZANAFLEX  TAKE 0.5-1.5 TABLETS BY MOUTH EVERY 8 (EIGHT) HOURS AS NEEDED FOR MUSCLE SPASMS (MUSCLE TIGHTNESS)   traMADol  50 MG tablet Commonly known as: ULTRAM  Take 1 tablet by mouth 2 (two) times daily. PRN        Allergies:  Allergies  Allergen Reactions   Aspirin  Swelling    Upset stomach  Upset stomach    Azithromycin     Contraindicated in Myasthenia    Ace Inhibitors     cough   Amlodipine     Edema of feet and hands   Gabapentin     anxiety   Pregabalin Other (See Comments)    anxiety    Family History: Family History  Problem Relation Age of Onset   Diabetes Mother    Hypertension Mother    Heart disease Mother 41       stent x 1    Migraines Sister    Stroke Maternal Grandfather    Diabetes Paternal Grandfather    Muscular dystrophy Sister    Heart attack Maternal Aunt    Breast cancer Neg Hx    Thyroid  disease Neg Hx     Social History:  reports that she quit smoking about 48 years ago. Her smoking use included cigarettes. She started smoking about 51 years ago. She has a 1.5 pack-year smoking history. She has never used smokeless tobacco. She reports that she does not drink alcohol and does not use drugs.   Physical Exam: BP 115/71   Pulse (!) 103   Ht 5\' 5"  (1.651 m)   Wt 229 lb (103.9 kg)   BMI 38.11 kg/m    Constitutional:  Alert and oriented, No acute distress. HEENT: [place] AT, moist mucus membranes.  Trachea midline, no masses. Cardiovascular: No clubbing, cyanosis, or edema. Respiratory: Normal respiratory effort, no increased work of breathing. GI: Abdomen is soft, nontender, nondistended, no abdominal masses Skin: No rashes, bruises or suspicious lesions. Neurologic: Grossly intact, no focal deficits, moving all 4 extremities. Psychiatric: Normal mood and affect.   Urinalysis 1+ protein/1+ blood dipstick, 3-10 RBC/>10 epis microscopy    Assessment & Plan:    1.  Microhematuria AUA risk stratification: High We discussed the recommended evaluation of high risk hematuria which consist of CT urogram and cystoscopy.  The procedures were discussed in detail and he/she has elected to proceed with further evaluation All questions were answered CTU order placed and cystoscopy was scheduled   [facility] [address] [place], [place] [address] [phone]

## 2023-10-29 NOTE — Patient Instructions (Signed)
 Scheduled [phone]

## 2023-10-31 ENCOUNTER — Encounter: Payer: Self-pay | Admitting: Urology

## 2023-11-06 ENCOUNTER — Ambulatory Visit
Admission: RE | Admit: 2023-11-06 | Discharge: 2023-11-06 | Disposition: A | Discharge status: Home / Self Care | Source: Ambulatory Visit | Attending: Urology | Admitting: Urology

## 2023-11-06 DIAGNOSIS — R3129 Other microscopic hematuria: Secondary | ICD-10-CM | POA: Diagnosis present

## 2023-11-06 MED ORDER — IOHEXOL 300 MG/ML  SOLN
100.0000 mL | Freq: Once | INTRAMUSCULAR | Status: AC | PRN
  Administered 2023-11-06: 100 mL via INTRAVENOUS

## 2023-11-12 ENCOUNTER — Ambulatory Visit: Payer: Self-pay | Admitting: Urology

## 2023-11-13 ENCOUNTER — Encounter: Payer: Self-pay | Admitting: Urology

## 2023-12-11 ENCOUNTER — Encounter: Payer: Self-pay | Admitting: Urology

## 2023-12-11 ENCOUNTER — Ambulatory Visit (INDEPENDENT_AMBULATORY_CARE_PROVIDER_SITE_OTHER): Admitting: Urology

## 2023-12-11 VITALS — BP 152/72 | HR 77 | Ht 62.0 in | Wt 224.0 lb

## 2023-12-11 DIAGNOSIS — R3129 Other microscopic hematuria: Secondary | ICD-10-CM

## 2023-12-11 LAB — MICROSCOPIC EXAMINATION

## 2023-12-11 LAB — URINALYSIS, COMPLETE
Bilirubin, UA: NEGATIVE
Glucose, UA: NEGATIVE
Leukocytes,UA: NEGATIVE
Nitrite, UA: NEGATIVE
Protein,UA: NEGATIVE
Specific Gravity, UA: 1.025 (ref 1.005–1.030)
Urobilinogen, Ur: 0.2 mg/dL (ref 0.2–1.0)
pH, UA: 6 (ref 5.0–7.5)

## 2023-12-11 NOTE — Progress Notes (Signed)
   12/11/23  CC:  Chief Complaint  Patient presents with   Cysto    HPI: 68 y.o. female with high risk microhematuria; referred to prior office note 10/29/2023.  CT urogram without upper tract abnormalities.  UA today 3-10 RBC  Blood pressure (!) 152/72, pulse 77, height 5' 2 (1.575 m), weight 224 lb (101.6 kg). Normal external genitalia with marked posterior location of urethral meatus   Cystoscopy Procedure Note  Patient identification was confirmed, informed consent was obtained, and patient was prepped using Betadine  solution.  Lidocaine  jelly was administered per urethral meatus.    Procedure: - Flexible cystoscope introduced, without any difficulty.   - Thorough search of the bladder revealed:    normal urethral meatus    normal urothelium    no stones    no ulcers     no tumors    no urethral polyps    no trabeculation  - Ureteral orifices were normal in position and appearance.  Post-Procedure: - Patient tolerated the procedure well  Assessment/ Plan: No urethral/abnormalities on cystoscopy No upper tract abnormalities on CTU 1 year follow-up with UA    Geraline Knapp, MD

## 2023-12-31 ENCOUNTER — Ambulatory Visit (INDEPENDENT_AMBULATORY_CARE_PROVIDER_SITE_OTHER): Admitting: Sleep Medicine

## 2023-12-31 ENCOUNTER — Encounter: Payer: Self-pay | Admitting: Sleep Medicine

## 2023-12-31 VITALS — BP 120/72 | HR 72 | Temp 97.1°F | Ht 62.0 in | Wt 223.6 lb

## 2023-12-31 DIAGNOSIS — G4733 Obstructive sleep apnea (adult) (pediatric): Secondary | ICD-10-CM

## 2023-12-31 DIAGNOSIS — Z6841 Body Mass Index (BMI) 40.0 and over, adult: Secondary | ICD-10-CM

## 2023-12-31 DIAGNOSIS — I10 Essential (primary) hypertension: Secondary | ICD-10-CM | POA: Diagnosis not present

## 2023-12-31 DIAGNOSIS — Z87891 Personal history of nicotine dependence: Secondary | ICD-10-CM

## 2023-12-31 NOTE — Patient Instructions (Signed)

## 2023-12-31 NOTE — Progress Notes (Signed)
 Name:Jasmine Woods MRN: [id number] DOB: 1956-03-16   CHIEF COMPLAINT:  CPAP F/U   HISTORY OF PRESENT ILLNESS:  [name] is a 68 y.o. w/ a h/o OSA, HTN, DMII and morbid obesity who presents for CPAP follow up visit. Reports difficulty using CPAP therapy consistently due to due pressure feeling too high. She was previously using the Mirage FX nasal mask, which causes air leaks. States that she feels more refreshed upon awakening with CPAP therapy.      PAST MEDICAL HISTORY :   has a past medical history of Allergy, Anxiety, Asthma, Bladder polyp, BMI 45.0-49.9, adult (HCC), Carpal tunnel syndrome, Complication of anesthesia, Depression, Diabetes mellitus without complication (HCC), Double vision, Dyslipidemia, Frequency of urination, Glaucoma, left eye, H/O myasthenia gravis, HA (headache), Heart murmur, bariatric surgery, Hypertension, Internal hemorrhoids, Myasthenia gravis (HCC), Myasthenia gravis (HCC), Neuropathy, generalized, Nuclear sclerotic cataract of both eyes, Obesity (BMI 30-39.9), Obstructive sleep apnea on CPAP, OSA on CPAP, Patient is Jehovah's Witness, Rotator cuff tendonitis (07/2009), Sleep apnea, Thyroid  nodule, Vaginal dryness, menopausal, and Vitamin D  deficiency.  has a past surgical history that includes Bariatric Surgery (2014); Upper gi endoscopy; Colonoscopy (2012); Abdominal hysterectomy (1991); Knee surgery (Right, [phone]); Thyroid  surgery; Pars plana vitrectomy (Left, 03/27/2016); Vitrectomy (Left, 04/27/2016); Colonoscopy with propofol  (N/A, 09/30/2016); Cataract extraction w/PHACO (Left, 01/28/2017); Oophorectomy; Carpal tunnel release (Left, 06/08/2019); Carpal tunnel release (Right, 08/19/2019); and Cataract extraction w/PHACO (Right, 05/20/2023). Prior to Admission medications   Medication Sig Start Date End Date Taking? Authorizing Provider  acetaminophen  (TYLENOL ) 500 MG tablet Take 1,000 mg by mouth every 6 (six) hours as needed for  mild pain. Reported on 07/06/2015   Yes [provider]  albuterol  (VENTOLIN  HFA) 108 (90 Base) MCG/ACT inhaler INHALE 2 PUFFS BY MOUTH EVERY 4 HOURS AS NEEDED FOR WHEEZING OR SHORTNESS OF BREATH. 09/13/22 12/31/23 Yes Sowles, Krichna, MD  budesonide  (RHINOCORT  AQUA) 32 MCG/ACT nasal spray Place 2 sprays into both nostrils at bedtime. 08/04/19  Yes Sowles, Krichna, MD  cholecalciferol (VITAMIN D3) 25 MCG (1000 UNIT) tablet Take 1,000 Units by mouth daily.   Yes [provider]  Coenzyme Q10 (CO Q 10) 100 MG CAPS Take 1 capsule by mouth daily. 09/05/16  Yes Sowles, Krichna, MD  cyanocobalamin  (VITAMIN B12) 500 MCG tablet Take 500 mcg by mouth 3 (three) times a week.   Yes [provider]  DULoxetine  (CYMBALTA ) 60 MG capsule TAKE 1 CAPSULE (60 MG TOTAL) BY MOUTH DAILY. 05/14/23 05/13/24 Yes Sowles, Krichna, MD  fluticasone -salmeterol (ADVAIR  HFA) 45-21 MCG/ACT inhaler Inhale 2 puffs into the lungs 2 (two) times daily. 07/02/23  Yes Sowles, Krichna, MD  folic acid  (FOLVITE ) 1 MG tablet Take 1 mg by mouth. 09/25/23 09/19/24 Yes [provider]  glucose blood (FREESTYLE LITE) test strip 100 each by Other route daily. Use one strip daily for blood glucose testing 01/08/23  Yes Sowles, Krichna, MD  Lancets (FREESTYLE) lancets Use one lancet daily for blood glucose testing 01/08/23  Yes Sowles, Krichna, MD  methotrexate (RHEUMATREX) 2.5 MG tablet Take 15 mg by mouth once a week. 11/13/23  Yes [provider]  montelukast  (SINGULAIR ) 10 MG tablet TAKE 1 TABLET BY MOUTH EVERYDAY AT BEDTIME 05/14/23  Yes Sowles, Krichna, MD  Multiple Vitamins-Minerals (MULTIVITAMIN WITH MINERALS) tablet Take 1 tablet by mouth daily.   Yes [provider]  Omega-3 Fatty Acids (FISH OIL) 1200 MG CAPS Take 2 capsules by mouth daily.   Yes [provider]  pyridostigmine  (  MESTINON ) 60 MG tablet TAKE 1 TABLET AT 8 AM, NOON, AND AT 4 PM. OK TO TAKE EXTRA DOSE AS NEEDED. 04/15/23  Yes  Patel, Donika K, DO  telmisartan -hydrochlorothiazide (MICARDIS  HCT) 80-25 MG tablet Take 1 tablet by mouth daily. 05/14/23 05/13/24 Yes Sowles, Krichna, MD  tirzepatide  (MOUNJARO ) 7.5 MG/0.5ML Pen Inject 7.5 mg into the skin once a week. 05/14/23  Yes Sowles, Krichna, MD  TREMFYA ONE-PRESS 100 MG/ML pen  12/01/23  Yes [provider]  predniSONE  (DELTASONE ) 5 MG tablet Take by mouth. 09/25/23   [provider]  tiZANidine  (ZANAFLEX ) 4 MG tablet TAKE 0.5-1.5 TABLETS BY MOUTH EVERY 8 (EIGHT) HOURS AS NEEDED FOR MUSCLE SPASMS (MUSCLE TIGHTNESS) 07/28/23   Glenard, Krichna, MD  traMADol  (ULTRAM ) 50 MG tablet Take 1 tablet by mouth 2 (two) times daily. PRN    [provider]   Allergies  Allergen Reactions   Aspirin  Swelling    Upset stomach  Upset stomach    Azithromycin     Contraindicated in Myasthenia    Ace Inhibitors     cough   Amlodipine     Edema of feet and hands   Gabapentin     anxiety   Pregabalin Other (See Comments)    anxiety    FAMILY HISTORY:  family history includes Diabetes in her mother and paternal grandfather; Heart attack in her maternal aunt; Heart disease (age of onset: 57) in her mother; Hypertension in her mother; Migraines in her sister; Muscular dystrophy in her sister; Stroke in her maternal grandfather. SOCIAL HISTORY:  reports that she quit smoking about 48 years ago. Her smoking use included cigarettes. She started smoking about 51 years ago. She has a 1.5 pack-year smoking history. She has never used smokeless tobacco. She reports that she does not drink alcohol and does not use drugs.   Review of Systems:  Gen:  Denies  fever, sweats, chills weight loss  HEENT: Denies blurred vision, double vision, ear pain, eye pain, hearing loss, nose bleeds, sore throat Cardiac:  No dizziness, chest pain or heaviness, chest tightness,edema, No JVD Resp:   No cough, -sputum production, -shortness of breath,-wheezing, -hemoptysis,  Gi: Denies  swallowing difficulty, stomach pain, nausea or vomiting, diarrhea, constipation, bowel incontinence Gu:  Denies bladder incontinence, burning urine Ext:   Denies Joint pain, stiffness or swelling Skin: Denies  skin rash, easy bruising or bleeding or hives Endoc:  Denies polyuria, polydipsia , polyphagia or weight change Psych:   Denies depression, insomnia or hallucinations  Other:  All other systems negative  VITAL SIGNS: BP 120/72 (BP Location: Right Arm, Cuff Size: Large)   Pulse 72   Temp (!) 97.1 F (36.2 C)   Ht 5' 2 (1.575 m)   Wt 223 lb 9.6 oz (101.4 kg)   SpO2 99%   BMI 40.90 kg/m    Physical Examination:   General Appearance: No distress  EYES PERRLA, EOM intact.   NECK Supple, No JVD Pulmonary: normal breath sounds, No wheezing.  CardiovascularNormal S1,S2.  No m/r/g.   Abdomen: Benign, Soft, non-tender. Skin:   warm, no rashes, no ecchymosis  Extremities: normal, no cyanosis, clubbing. Neuro:without focal findings,  speech normal  PSYCHIATRIC: Mood, affect within normal limits.   ASSESSMENT AND PLAN  OSA Counseled patient on increasing CPAP compliance. To improve comfort, changed pressure range to 4-12 cm H2O. Also fit patient with the Airtouch N30i nasal mask. Discussed the consequences of untreated sleep apnea. Advised not to drive drowsy for safety  of patient and others. Will follow up in 3 months.    HTN Stable, on current management. Following with PCP.   Morbid obesity Counseled patient on diet and lifestyle modification.    Patient  satisfied with Plan of action and management. All questions answered  I spent a total of 35 minutes reviewing chart data, face-to-face evaluation with the patient, counseling and coordination of care as detailed above.    Kastiel Simonian, M.D.  Sleep Medicine [facility]

## 2024-01-09 ENCOUNTER — Encounter: Payer: Self-pay | Admitting: Sleep Medicine

## 2024-01-09 DIAGNOSIS — G4733 Obstructive sleep apnea (adult) (pediatric): Secondary | ICD-10-CM

## 2024-01-12 ENCOUNTER — Encounter: Payer: Self-pay | Admitting: Family Medicine

## 2024-01-12 ENCOUNTER — Ambulatory Visit: Admitting: Family Medicine

## 2024-01-12 VITALS — BP 120/72 | HR 81 | Resp 16 | Ht 62.0 in | Wt 220.3 lb

## 2024-01-12 DIAGNOSIS — J3089 Other allergic rhinitis: Secondary | ICD-10-CM

## 2024-01-12 DIAGNOSIS — E538 Deficiency of other specified B group vitamins: Secondary | ICD-10-CM

## 2024-01-12 DIAGNOSIS — G7000 Myasthenia gravis without (acute) exacerbation: Secondary | ICD-10-CM | POA: Diagnosis not present

## 2024-01-12 DIAGNOSIS — G4733 Obstructive sleep apnea (adult) (pediatric): Secondary | ICD-10-CM

## 2024-01-12 DIAGNOSIS — I10 Essential (primary) hypertension: Secondary | ICD-10-CM

## 2024-01-12 DIAGNOSIS — L4050 Arthropathic psoriasis, unspecified: Secondary | ICD-10-CM

## 2024-01-12 DIAGNOSIS — E119 Type 2 diabetes mellitus without complications: Secondary | ICD-10-CM

## 2024-01-12 DIAGNOSIS — E042 Nontoxic multinodular goiter: Secondary | ICD-10-CM

## 2024-01-12 DIAGNOSIS — E1140 Type 2 diabetes mellitus with diabetic neuropathy, unspecified: Secondary | ICD-10-CM | POA: Diagnosis not present

## 2024-01-12 DIAGNOSIS — Z9884 Bariatric surgery status: Secondary | ICD-10-CM

## 2024-01-12 LAB — POCT GLYCOSYLATED HEMOGLOBIN (HGB A1C): Hemoglobin A1C: 5.6 % (ref 4.0–5.6)

## 2024-01-12 MED ORDER — DULOXETINE HCL 60 MG PO CPEP: ORAL_CAPSULE | Freq: Every day | ORAL | 1 refills | Status: DC

## 2024-01-12 MED ORDER — MONTELUKAST SODIUM 10 MG PO TABS: ORAL_TABLET | ORAL | 1 refills | Status: DC

## 2024-01-12 MED ORDER — TIRZEPATIDE 7.5 MG/0.5ML ~~LOC~~ SOAJ: 7.5000 mg | SUBCUTANEOUS | 1 refills | Status: DC

## 2024-01-12 MED ORDER — TELMISARTAN-HCTZ 80-25 MG PO TABS: 1.0000 | ORAL_TABLET | Freq: Every day | ORAL | 1 refills | Status: DC

## 2024-01-12 NOTE — Progress Notes (Signed)
 Name: Jasmine Woods   MRN: [id number]    DOB: Mar 31, 1956   Date:01/12/2024       Progress Note  Subjective  Chief Complaint  Chief Complaint  Patient presents with   Medical Management of Chronic Issues   Discussed the use of AI scribe software for clinical note transcription with the patient, who gave verbal consent to proceed.  History of Present Illness Jasmine Woods is a 68 year old female with psoriatic arthritis who presents for a four-month follow-up.  She uses a cane for balance and pain management, primarily due to her left hip issues. The patient reports that her rheumatologist diagnosed her with psoriatic arthritis after performing a psoriatic arthritis panel and imaging that showed sacroiliitis. She experiences prolonged morning stiffness. She is currently on methotrexate and Tremfya, started on June 13th, with noted improvement in her shoulders, right hip, pelvis, and knees. However, significant pain persists in her left hip, with a current pain level of four out of ten. She occasionally takes Tylenol  for pain management.  She has a history of obesity and underwent bariatric surgery in 2014, initially losing weight but gradually regaining it. She started Mounjaro  in July of the previous year for weight management and type 2 diabetes, resulting in a ten-pound weight loss since her last visit. Her current BMI is 40.29. She has a history of type 2 diabetes, with her A1c currently at 5.6. She previously used Trulicity  and has experienced side effects with metformin .  She has myasthenia gravis and takes pyridostigmine  three times a day, which she finds effective. She experiences ptosis towards the end of the day as the medication wears off, but manages by going to bed early. She reports doing better emotionally after the loss of her husband, with more good days than bad.  She has diabetic neuropathy, managed with Cymbalta , and hypertension, for which she takes  telmisartan  80/25 mg. No chest pain or palpitations. She has asthma and allergies, using over-the-counter Rhinocort  Aqua and Advair  as needed, and takes Singulair  for her allergies.  She uses a CPAP machine for sleep apnea and recently started seeing a new pulmonologist. She has a history of bladder polyps, which are now resolved, and has undergone a hematuria workup with normal results. She reports some shortness of breath with activity and has a history of carpal tunnel surgery on both sides, with no current issues.    Patient Active Problem List   Diagnosis Date Noted   Unilateral primary osteoarthritis, left hip 06/17/2023   Fatigue 03/24/2023   Carpal tunnel syndrome, right upper limb 08/19/2019   Carpal tunnel syndrome, left upper limb 06/08/2019   Multinodular goiter 11/25/2018   Essential hypertension 11/11/2018   DOE (dyspnea on exertion) 04/15/2018   Hyperlipidemia 12/09/2017   Diaphoresis 11/26/2017   Special screening for malignant neoplasms, colon    Epiretinal membrane (ERM) of left eye 09/09/2016   Glaucoma, left eye 09/05/2016   Seronegative myasthenia gravis (HCC) 08/08/2015   Psoriasis 07/06/2015   Stress incontinence 07/06/2015   Chronic tension-type headache, intractable 05/29/2015   Anxiety and depression 04/12/2015   Asthma, mild intermittent 04/12/2015   Bladder polyp 04/12/2015   Carpal tunnel syndrome 04/12/2015   Binocular vision disorder with diplopia 04/12/2015   Dyslipidemia 04/12/2015   H/O: HTN (hypertension) 04/12/2015   Hemorrhoids, internal 04/12/2015   Neuropathy 04/12/2015   Nuclear sclerotic cataract 04/12/2015   Morbid obesity (HCC) 04/12/2015   Obstructive apnea 04/12/2015   Perennial allergic rhinitis 04/12/2015   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 04/12/2015   Type 2 diabetes, controlled, with neuropathy (HCC) 04/12/2015   Vitamin D  deficiency 04/12/2015   Cephalalgia 08/16/2014   Bariatric surgery status 10/12/2012     Past Surgical History:  Procedure Laterality Date   ABDOMINAL HYSTERECTOMY  1991   BARIATRIC SURGERY  2014   CARPAL TUNNEL RELEASE Left 06/08/2019   Procedure: LEFT CARPAL TUNNEL RELEASE;  Surgeon: Vernetta Lonni GRADE, MD;  Location: [facility];  Service: Orthopedics;  Laterality: Left;   CARPAL TUNNEL RELEASE Right 08/19/2019   Procedure: RIGHT CARPAL TUNNEL RELEASE;  Surgeon: Vernetta Lonni GRADE, MD;  Location: [facility];  Service: Orthopedics;  Laterality: Right;   CATARACT EXTRACTION W/PHACO Left 01/28/2017   Procedure: CATARACT EXTRACTION PHACO AND INTRAOCULAR LENS PLACEMENT (IOC);  Surgeon: Jaye Fallow, MD;  Location: [facility];  Service: Ophthalmology;  Laterality: Left;  US  00:37 AP% 17.8 CDE 6.61 Fluid pack lot # [id number]   CATARACT EXTRACTION W/PHACO Right 05/20/2023   Procedure: CATARACT EXTRACTION PHACO AND INTRAOCULAR LENS PLACEMENT (IOC) RIGHT DIABETIC  7.35  00:46.2;  Surgeon: Jaye Fallow, MD;  Location: [facility];  Service: Ophthalmology;  Laterality: Right;   COLONOSCOPY  2012   COLONOSCOPY WITH PROPOFOL  N/A 09/30/2016   Procedure: COLONOSCOPY WITH PROPOFOL ;  Surgeon: Rogelia Copping, MD;  Location: [facility];  Service: Endoscopy;  Laterality: N/A;  diabetic - diet controlled sleep apnea   EYE SURGERY     Vitrectomy Epiretinal Membrane Peel left eye   KNEE SURGERY Right 1979-1980   OOPHORECTOMY     PARS PLANA VITRECTOMY Left 03/27/2016   Procedure: PARS PLANA VITRECTOMY WITH 25 GAUGE AND MEMBRANE PEEL, INTERNAL LIMITING MEMBRANE;  Surgeon: Arley DELENA Ruder, MD;  Location: [facility];  Service: Ophthalmology;  Laterality: Left;   THYROID  SURGERY     BX done was benign   UPPER GI ENDOSCOPY     VITRECTOMY Left 04/27/2016    Family History  Problem Relation Age of Onset   Diabetes Mother    Hypertension Mother    Heart disease Mother 24       stent x 1    Hyperlipidemia Mother    Kidney disease Mother     Obesity Mother    Migraines Sister    Early death Sister    Stroke Maternal Grandfather    Diabetes Paternal Grandfather    Muscular dystrophy Sister    Heart attack Maternal Aunt    ADD / ADHD Son    Breast cancer Neg Hx    Thyroid  disease Neg Hx     Social History   Tobacco Use   Smoking status: Former    Current packs/day: 0.00    Average packs/day: 0.5 packs/day for 3.0 years (1.5 ttl pk-yrs)    Types: Cigarettes    Start date: 06/24/1972    Quit date: 06/25/1975    Years since quitting: 48.5   Smokeless tobacco: Never   Tobacco comments:    smoked as teenager  Substance Use Topics   Alcohol use: No    Alcohol/week: 0.0 standard drinks of alcohol     Current Outpatient Medications:    acetaminophen  (TYLENOL ) 500 MG tablet, Take 1,000 mg by mouth every 6 (six) hours as needed for mild pain. Reported on 07/06/2015, Disp: , Rfl:    albuterol  (VENTOLIN  HFA) 108 (90 Base) MCG/ACT inhaler, INHALE 2 PUFFS BY MOUTH EVERY 4 HOURS AS NEEDED FOR WHEEZING OR SHORTNESS OF BREATH., Disp: 18 g, Rfl: 0  budesonide  (RHINOCORT  AQUA) 32 MCG/ACT nasal spray, Place 2 sprays into both nostrils at bedtime., Disp: 15 mL, Rfl: 2   cholecalciferol (VITAMIN D3) 25 MCG (1000 UNIT) tablet, Take 1,000 Units by mouth daily., Disp: , Rfl:    Coenzyme Q10 (CO Q 10) 100 MG CAPS, Take 1 capsule by mouth daily., Disp: 30 capsule, Rfl: 2   cyanocobalamin  (VITAMIN B12) 500 MCG tablet, Take 500 mcg by mouth 3 (three) times a week., Disp: , Rfl:    DULoxetine  (CYMBALTA ) 60 MG capsule, TAKE 1 CAPSULE (60 MG TOTAL) BY MOUTH DAILY., Disp: 90 capsule, Rfl: 1   fluticasone -salmeterol (ADVAIR  HFA) 45-21 MCG/ACT inhaler, Inhale 2 puffs into the lungs 2 (two) times daily., Disp: 1 each, Rfl: 2   folic acid  (FOLVITE ) 1 MG tablet, Take 1 mg by mouth., Disp: , Rfl:    glucose blood (FREESTYLE LITE) test strip, 100 each by Other route daily. Use one strip daily for blood glucose testing, Disp: 100 each, Rfl: 12   Lancets  (FREESTYLE) lancets, Use one lancet daily for blood glucose testing, Disp: 100 each, Rfl: 12   methotrexate (RHEUMATREX) 2.5 MG tablet, Take 15 mg by mouth once a week., Disp: , Rfl:    montelukast  (SINGULAIR ) 10 MG tablet, TAKE 1 TABLET BY MOUTH EVERYDAY AT BEDTIME, Disp: 90 tablet, Rfl: 1   Multiple Vitamins-Minerals (MULTIVITAMIN WITH MINERALS) tablet, Take 1 tablet by mouth daily., Disp: , Rfl:    Omega-3 Fatty Acids (FISH OIL) 1200 MG CAPS, Take 2 capsules by mouth daily., Disp: , Rfl:    pyridostigmine  (MESTINON ) 60 MG tablet, TAKE 1 TABLET AT 8 AM, NOON, AND AT 4 PM. OK TO TAKE EXTRA DOSE AS NEEDED., Disp: 360 tablet, Rfl: 3   telmisartan -hydrochlorothiazide (MICARDIS  HCT) 80-25 MG tablet, Take 1 tablet by mouth daily., Disp: 90 tablet, Rfl: 1   tirzepatide  (MOUNJARO ) 7.5 MG/0.5ML Pen, Inject 7.5 mg into the skin once a week., Disp: 6 mL, Rfl: 1   TREMFYA ONE-PRESS 100 MG/ML pen, , Disp: , Rfl:    loratadine  (CLARITIN ) 10 MG tablet, Take 10 mg by mouth daily as needed for allergies., Disp: , Rfl:    predniSONE  (DELTASONE ) 5 MG tablet, Take by mouth., Disp: , Rfl:    tiZANidine  (ZANAFLEX ) 4 MG tablet, TAKE 0.5-1.5 TABLETS BY MOUTH EVERY 8 (EIGHT) HOURS AS NEEDED FOR MUSCLE SPASMS (MUSCLE TIGHTNESS), Disp: 90 tablet, Rfl: 2   traMADol  (ULTRAM ) 50 MG tablet, Take 1 tablet by mouth 2 (two) times daily. PRN, Disp: , Rfl:   Allergies  Allergen Reactions   Aspirin  Swelling    Upset stomach  Upset stomach    Azithromycin     Contraindicated in Myasthenia    Ace Inhibitors     cough   Amlodipine     Edema of feet and hands   Gabapentin     anxiety   Pregabalin Other (See Comments)    anxiety    I personally reviewed active problem list, medication list, allergies with the patient/caregiver today.   ROS  Ten systems reviewed and is negative except as mentioned in HPI    Objective Physical Exam  CONSTITUTIONAL: Patient appears well-developed and well-nourished.  No  distress. HEENT: Head atraumatic, normocephalic, neck supple. CARDIOVASCULAR: Normal rate, regular rhythm and normal heart sounds.  No murmur heard. No BLE edema. PULMONARY: Effort normal and breath sounds normal. No respiratory distress. MUSCULOSKELETAL:antalgic, using a cane PSYCHIATRIC: Patient has a normal mood and affect. behavior is normal. Judgment and thought content  normal.  Vitals:   01/12/24 1416  BP: 120/72  Pulse: 81  Resp: 16  SpO2: 100%  Weight: 220 lb 4.8 oz (99.9 kg)  Height: 5' 2 (1.575 m)    Body mass index is 40.29 kg/m.  Recent Results (from the past 2160 hours)  Urinalysis, Complete     Status: Abnormal   Collection Time: 10/29/23  1:31 PM  Result Value Ref Range   Specific Gravity, UA 1.025 1.005 - 1.030   pH, UA 6.0 5.0 - 7.5   Color, UA Yellow Yellow   Appearance Ur Slightly cloudy Clear   Leukocytes,UA Negative Negative   Protein,UA 1+ (A) Negative/Trace   Glucose, UA Negative Negative   Ketones, UA Negative Negative   RBC, UA 1+ (A) Negative   Bilirubin, UA Negative Negative   Urobilinogen, Ur 0.2 0.2 - 1.0 mg/dL   Nitrite, UA Negative Negative   Microscopic Examination See below:     Comment: Microscopic was indicated and was performed.  Microscopic Examination     Status: Abnormal   Collection Time: 10/29/23  1:31 PM   Urine  Result Value Ref Range   WBC, UA 0-5 0 - 5 /hpf   RBC, Urine 3-10 (A) 0 - 2 /hpf   Epithelial Cells (non renal) >10 (A) 0 - 10 /hpf   Casts Present (A) None seen /lpf   Cast Type Hyaline casts N/A   Mucus, UA Present (A) Not Estab.   Bacteria, UA Many (A) None seen/Few  Bladder Scan (Post Void Residual) in office     Status: None   Collection Time: 10/29/23  1:35 PM  Result Value Ref Range   Scan Result 0   Urinalysis, Complete     Status: Abnormal   Collection Time: 12/11/23  2:09 PM  Result Value Ref Range   Specific Gravity, UA 1.025 1.005 - 1.030   pH, UA 6.0 5.0 - 7.5   Color, UA Yellow Yellow    Appearance Ur Clear Clear   Leukocytes,UA Negative Negative   Protein,UA Negative Negative/Trace   Glucose, UA Negative Negative   Ketones, UA Trace (A) Negative   RBC, UA 1+ (A) Negative   Bilirubin, UA Negative Negative   Urobilinogen, Ur 0.2 0.2 - 1.0 mg/dL   Nitrite, UA Negative Negative   Microscopic Examination See below:     Comment: Microscopic was indicated and was performed.  Microscopic Examination     Status: Abnormal   Collection Time: 12/11/23  2:09 PM   Urine  Result Value Ref Range   WBC, UA 0-5 0 - 5 /hpf   RBC, Urine 3-10 (A) 0 - 2 /hpf   Epithelial Cells (non renal) 0-10 0 - 10 /hpf   Casts Present (A) None seen /lpf   Cast Type Hyaline casts N/A   Bacteria, UA Moderate (A) None seen/Few  POCT glycosylated hemoglobin (Hb A1C)     Status: None   Collection Time: 01/12/24  2:20 PM  Result Value Ref Range   Hemoglobin A1C 5.6 4.0 - 5.6 %   HbA1c POC (<> result, manual entry)     HbA1c, POC (prediabetic range)     HbA1c, POC (controlled diabetic range)      Diabetic Foot Exam:     PHQ2/9:    01/12/2024    2:09 PM 10/08/2023    1:20 PM 09/12/2023    2:25 PM 08/08/2023   11:02 AM 07/25/2023    8:22 AM  Depression screen PHQ 2/9  Decreased Interest  0 0 0 0 0  Down, Depressed, Hopeless 0 0 0 0 0  PHQ - 2 Score 0 0 0 0 0  Altered sleeping  3 0 0 0  Tired, decreased energy  3 0 0 0  Change in appetite  1 0 0 0  Feeling bad or failure about yourself   0 0 0 0  Trouble concentrating  0 0 0 0  Moving slowly or fidgety/restless  0 0 0 0  Suicidal thoughts  0 0 0 0  PHQ-9 Score  7 0 0 0  Difficult doing work/chores  Somewhat difficult Not difficult at all Not difficult at all Not difficult at all    phq 9 is negative  Fall Risk:    01/12/2024    2:09 PM 10/08/2023    9:57 AM 10/07/2023    2:39 PM 09/12/2023    2:14 PM 07/11/2023    7:57 AM  Fall Risk   Falls in the past year? 0 0 0 0 0  Number falls in past yr: 0 0  0 0  Injury with Fall? 0 0  0 0  Risk  for fall due to : No Fall Risks No Fall Risks  No Fall Risks No Fall Risks  Follow up Falls evaluation completed Falls prevention discussed;Falls evaluation completed  Falls prevention discussed;Education provided;Falls evaluation completed Falls prevention discussed;Education provided;Falls evaluation completed    Assessment & Plan Psoriatic arthritis with sacroiliitis, moderate to severe, poorly controlled Symptoms improved in shoulders, right hip, pelvis, and knees; left hip remains problematic. Pain manageable at 4/10. Informed about immunosuppression risks. - Continue methotrexate and Tremfya as prescribed. - Monitor for signs of immunosuppression. - Follow up with rheumatologist in two months.  Left hip osteoarthritis, severe, pending hip replacement Severe osteoarthritis likely bone on bone, contributing to balance issues and pain. Hip replacement contingent on weight loss to BMI under 40.  Obesity, BMI 40.29, post-bariatric surgery, weight loss plateau BMI 40.29, weight loss plateau at 220-222 lbs. Mounjaro  effective for weight loss and diabetes management. Discussed intermittent fasting to overcome plateau. - Continue Mounjaro  at current dose. - Implement intermittent fasting with a goal of 16-18 hours fasting period. - Focus on high protein intake during eating windows.  Type 2 diabetes mellitus with diabetic neuropathy, well controlled Diabetes well controlled with A1c of 5.6. Neuropathy managed with Cymbalta . - Continue Mounjaro  and Cymbalta  as prescribed.  Hypertension associated with Type 2 diabetes No chest pain or palpitations. - Continue telmisartan  as prescribed.  Myasthenia gravis, well-managed on therapy Well-managed with pyridostigmine . Manageable ptosis towards end of day. - Continue pyridostigmine  as prescribed.  Obstructive sleep apnea, on CPAP Managed with CPAP therapy. Care established with Dr. Jess. - Continue CPAP therapy.  Asthma and allergic  rhinitis Adequate supply of Advair . - Continue current asthma and allergy management. - Prescribe Singulair .  General Health Maintenance Mammogram due in September. Lipid panel overdue since May 2024. Vitamin D  slightly low, supplements taken. - Order mammogram for September. - Order lipid panel. - Continue vitamin D  supplementation.

## 2024-01-12 NOTE — Patient Instructions (Signed)
Intermittent fasting

## 2024-01-13 ENCOUNTER — Ambulatory Visit: Payer: Self-pay | Admitting: Family Medicine

## 2024-01-13 LAB — MICROALBUMIN / CREATININE URINE RATIO
Creatinine, Urine: 126 mg/dL (ref 20–275)
Microalb Creat Ratio: 14 mg/g{creat} (ref <30)
Microalb, Ur: 1.8 mg/dL

## 2024-01-13 LAB — LIPID PANEL
Cholesterol: 145 mg/dL (ref <200)
HDL: 59 mg/dL (ref >=50)
LDL Cholesterol (Calc): 70 mg/dL
Non-HDL Cholesterol (Calc): 86 mg/dL (ref <130)
Total CHOL/HDL Ratio: 2.5 (calc) (ref <5.0)
Triglycerides: 78 mg/dL (ref <150)

## 2024-01-28 LAB — OPHTHALMOLOGY REPORT-SCANNED

## 2024-02-10 ENCOUNTER — Encounter: Payer: Self-pay | Admitting: Endocrinology

## 2024-02-10 ENCOUNTER — Ambulatory Visit (INDEPENDENT_AMBULATORY_CARE_PROVIDER_SITE_OTHER): Payer: Medicare Other | Admitting: Endocrinology

## 2024-02-10 VITALS — BP 136/72 | HR 71 | Resp 20 | Ht 62.0 in | Wt 220.8 lb

## 2024-02-10 DIAGNOSIS — E042 Nontoxic multinodular goiter: Secondary | ICD-10-CM | POA: Diagnosis not present

## 2024-02-10 LAB — TSH: TSH: 0.73 m[IU]/L (ref 0.40–4.50)

## 2024-02-10 LAB — T4, FREE: Free T4: 1 ng/dL (ref 0.8–1.8)

## 2024-02-10 NOTE — Progress Notes (Signed)
 Outpatient Endocrinology Note Jasmine Ercole Georg, MD  02/10/24  Patient's Name: Jasmine Woods    DOB: 08/09/1955    MRN: [id number]  REASON OF VISIT: Follow-up for mulidthyroid nodules   PCP: Sowles, Krichna, MD  REFERRING PROVIDER:    HISTORY OF PRESENT ILLNESS:   Jasmine Woods is a 68 y.o. old female with past medical history as listed below is presented for a follow up of thyroid  nodules.    Pertinent Thyroid  History:  Patient was previously and last seen by Dr. Kassie in April 2023.  Patient was diagnosed with multiple thyroid  nodules in 2017 and had biopsy in 2017 and 2018 [facility], results not available patient says were benign.  She is euthyroid not on thyroid  medication.  Serial monitoring of thyroid  nodule ultrasound in April 2019, June 2020, July 2021, April 2023 with relatively stable bilateral thyroid  nodules.  Ultrasound thyroid  in April 2023 showed right thyroid  nodule measuring 1.4 cm, 1.2 cm, 1.2 cm and left thyroid  nodule measuring 1.5 cm.  There was increase in size of left thyroid  nodule, there is result comment note about repeat biopsy versus monitoring, no documentation available about that regarding further discussion with patient.  She complains of occasional neck discomfort and rare choking.  No difficulty breathing.  No hypo and hyperthyroid symptoms.  # Ultrasound thyroid  in August 2024 with stable bilateral thyroid  nodules.  Other: She has complaints of profuse sweating for 3 months especially during daytime related with activities.  Does not happen at night when sleeping.  It is associated with overall tiredness/fatigue.  No palpitation, increased blood pressure, headache, anxiety.  She has similar sweating problem around 2020 and did get better over time until restarted 3 months ago.  She reports she had cardiology evaluation and were unremarkable.  She has been taking duloxetine /Cymbalta  for about 10 years. She had 24-hour urine  metanephrines and catecholamines in June 2019 with mild elevation of norepinephrine otherwise unremarkable.  Plasma free metanephrines were normal in August 2024.  She had normal thyroid  function test in the past.  She is euthyroid.  Not on thyroid  medication.  Interval history  Patient denies any neck discomfort or neck compressive symptoms.  Overall feeling usual energy.  No hypo and hyperthyroid symptoms.  No other complaints today.  REVIEW OF SYSTEMS:  As per history of present illness.   PAST MEDICAL HISTORY: Past Medical History:  Diagnosis Date   Allergy    Anxiety    Asthma    Bladder polyp    Dr. Nettie   BMI 45.0-49.9, adult [facility])    Carpal tunnel syndrome    Complication of anesthesia    needed nebulizer after surgery   Depression    Diabetes mellitus without complication (HCC)    Diet controlled   Double vision    seen by Dr. 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   Dyslipidemia    Frequency of urination    Glaucoma, left eye    H/O myasthenia gravis    MOSTLY AFFECTS EYES   HA (headache)    Heart murmur    followed by PCP   Hx of bariatric surgery    Hyperlipidemia    Hypertension    no meds   Internal hemorrhoids    Myasthenia gravis (HCC)    mostly effects eyes   Myasthenia gravis (HCC)    Neuropathy, generalized    diabetic on feet   Nuclear sclerotic cataract of both eyes    Obesity (BMI 30-39.9)  Obstructive sleep apnea on CPAP    uses cpap   OSA on CPAP    Patient is Jehovah's Witness    no blood products   Rotator cuff tendonitis 07/2009   also has impingment right shoulder pain, seen by Dr. Marchia   Sleep apnea    Thyroid  nodule    Vaginal dryness, menopausal    Vitamin D  deficiency     PAST SURGICAL HISTORY: Past Surgical History:  Procedure Laterality Date   ABDOMINAL HYSTERECTOMY  1991   BARIATRIC SURGERY  2014   CARPAL TUNNEL RELEASE Left 06/08/2019   Procedure: LEFT CARPAL TUNNEL RELEASE;  Surgeon: Vernetta Lonni GRADE, MD;  Location: [facility];  Service: Orthopedics;  Laterality: Left;   CARPAL TUNNEL RELEASE Right 08/19/2019   Procedure: RIGHT CARPAL TUNNEL RELEASE;  Surgeon: Vernetta Lonni GRADE, MD;  Location: [facility];  Service: Orthopedics;  Laterality: Right;   CATARACT EXTRACTION W/PHACO Left 01/28/2017   Procedure: CATARACT EXTRACTION PHACO AND INTRAOCULAR LENS PLACEMENT (IOC);  Surgeon: Jaye Fallow, MD;  Location: [facility];  Service: Ophthalmology;  Laterality: Left;  US  00:37 AP% 17.8 CDE 6.61 Fluid pack lot # [id number]   CATARACT EXTRACTION W/PHACO Right 05/20/2023   Procedure: CATARACT EXTRACTION PHACO AND INTRAOCULAR LENS PLACEMENT (IOC) RIGHT DIABETIC  7.35  00:46.2;  Surgeon: Jaye Fallow, MD;  Location: [facility];  Service: Ophthalmology;  Laterality: Right;   COLONOSCOPY  2012   COLONOSCOPY WITH PROPOFOL  N/A 09/30/2016   Procedure: COLONOSCOPY WITH PROPOFOL ;  Surgeon: Rogelia Copping, MD;  Location: [facility];  Service: Endoscopy;  Laterality: N/A;  diabetic - diet controlled sleep apnea   EYE SURGERY     Vitrectomy Epiretinal Membrane Peel left eye   KNEE SURGERY Right 1979-1980   OOPHORECTOMY     PARS PLANA VITRECTOMY Left 03/27/2016   Procedure: PARS PLANA VITRECTOMY WITH 25 GAUGE AND MEMBRANE PEEL, INTERNAL LIMITING MEMBRANE;  Surgeon: Arley DELENA Ruder, MD;  Location: [facility];  Service: Ophthalmology;  Laterality: Left;   THYROID  SURGERY     BX done was benign   UPPER GI ENDOSCOPY     VITRECTOMY Left 04/27/2016    ALLERGIES: Allergies  Allergen Reactions   Aspirin  Swelling    Upset stomach  Upset stomach    Azithromycin     Contraindicated in Myasthenia    Ace Inhibitors     cough   Amlodipine     Edema of feet and hands   Gabapentin     anxiety   Pregabalin Other (See Comments)    anxiety    FAMILY HISTORY:  Family History  Problem Relation Age of Onset   Diabetes Mother    Hypertension  Mother    Heart disease Mother 37       stent x 1    Hyperlipidemia Mother    Kidney disease Mother    Obesity Mother    Migraines Sister    Early death Sister    Stroke Maternal Grandfather    Diabetes Paternal Grandfather    Muscular dystrophy Sister    Heart attack Maternal Aunt    ADD / ADHD Son    Breast cancer Neg Hx    Thyroid  disease Neg Hx     SOCIAL HISTORY: Social History   Socioeconomic History   Marital status: Widowed    Spouse name: Fallow Diones   Number of children: 2   Years of education: 12   Highest education level: 12th grade  Occupational  History   Occupation: [employer]  Tobacco Use   Smoking status: Former    Current packs/day: 0.00    Average packs/day: 0.5 packs/day for 3.0 years (1.5 ttl pk-yrs)    Types: Cigarettes    Start date: 06/24/1972    Quit date: 06/25/1975    Years since quitting: 48.6   Smokeless tobacco: Never   Tobacco comments:    smoked as teenager  Vaping Use   Vaping status: Never Used  Substance and Sexual Activity   Alcohol use: No    Alcohol/week: 0.0 standard drinks of alcohol   Drug use: No   Sexual activity: Not Currently    Partners: Male  Other Topics Concern   Not on file  Social History Narrative   Right handed   One story home   12 years education   Retired from [employer]         Husband passed away    Social Drivers of Health   Financial Resource Strain: Low Risk  (01/08/2024)   Overall Financial Resource Strain (CARDIA)    Difficulty of Paying Living Expenses: Not hard at all  Food Insecurity: No Food Insecurity (01/08/2024)   Hunger Vital Sign    Worried About Running Out of Food in the Last Year: Never true    Ran Out of Food in the Last Year: Never true  Transportation Needs: No Transportation Needs (01/08/2024)   PRAPARE - Administrator, Civil Service (Medical): No    Lack of Transportation (Non-Medical): No  Physical Activity: Inactive (01/08/2024)   Exercise Vital Sign     Days of Exercise per Week: 0 days    Minutes of Exercise per Session: Not on file  Stress: No Stress Concern Present (01/08/2024)   [employer] of Occupational Health - Occupational Stress Questionnaire    Feeling of Stress: Not at all  Social Connections: Moderately Integrated (01/08/2024)   Social Connection and Isolation Panel    Frequency of Communication with Friends and Family: More than three times a week    Frequency of Social Gatherings with Friends and Family: More than three times a week    Attends Religious Services: More than 4 times per year    Active Member of [employer] or Organizations: Yes    Attends Banker Meetings: More than 4 times per year    Marital Status: Widowed    MEDICATIONS:  Current Outpatient Medications  Medication Sig Dispense Refill   acetaminophen  (TYLENOL ) 500 MG tablet Take 1,000 mg by mouth every 6 (six) hours as needed for mild pain. Reported on 07/06/2015     albuterol  (VENTOLIN  HFA) 108 (90 Base) MCG/ACT inhaler INHALE 2 PUFFS BY MOUTH EVERY 4 HOURS AS NEEDED FOR WHEEZING OR SHORTNESS OF BREATH. 18 g 0   budesonide  (RHINOCORT  AQUA) 32 MCG/ACT nasal spray Place 2 sprays into both nostrils at bedtime. 15 mL 2   cholecalciferol (VITAMIN D3) 25 MCG (1000 UNIT) tablet Take 1,000 Units by mouth daily.     Coenzyme Q10 (CO Q 10) 100 MG CAPS Take 1 capsule by mouth daily. 30 capsule 2   cyanocobalamin  (VITAMIN B12) 500 MCG tablet Take 500 mcg by mouth 3 (three) times a week.     DULoxetine  (CYMBALTA ) 60 MG capsule TAKE 1 CAPSULE (60 MG TOTAL) BY MOUTH DAILY. 90 capsule 1   fluticasone -salmeterol (ADVAIR  HFA) 45-21 MCG/ACT inhaler Inhale 2 puffs into the lungs 2 (two) times daily. 1 each 2   folic acid  (FOLVITE )  1 MG tablet Take 1 mg by mouth.     glucose blood (FREESTYLE LITE) test strip 100 each by Other route daily. Use one strip daily for blood glucose testing 100 each 12   Lancets (FREESTYLE) lancets Use one lancet daily for blood glucose  testing 100 each 12   loratadine  (CLARITIN ) 10 MG tablet Take 10 mg by mouth daily as needed for allergies.     methotrexate (RHEUMATREX) 2.5 MG tablet Take 15 mg by mouth once a week.     montelukast  (SINGULAIR ) 10 MG tablet TAKE 1 TABLET BY MOUTH EVERYDAY AT BEDTIME 90 tablet 1   Multiple Vitamins-Minerals (MULTIVITAMIN WITH MINERALS) tablet Take 1 tablet by mouth daily.     Omega-3 Fatty Acids (FISH OIL) 1200 MG CAPS Take 2 capsules by mouth daily.     pyridostigmine  (MESTINON ) 60 MG tablet TAKE 1 TABLET AT 8 AM, NOON, AND AT 4 PM. OK TO TAKE EXTRA DOSE AS NEEDED. 360 tablet 3   telmisartan -hydrochlorothiazide (MICARDIS  HCT) 80-25 MG tablet Take 1 tablet by mouth daily. 90 tablet 1   tirzepatide  (MOUNJARO ) 7.5 MG/0.5ML Pen Inject 7.5 mg into the skin once a week. 6 mL 1   TREMFYA ONE-PRESS 100 MG/ML pen      No current facility-administered medications for this visit.    PHYSICAL EXAM: Vitals:   02/10/24 1311  BP: 136/72  Pulse: 71  Resp: 20  SpO2: 98%  Weight: 220 lb 12.8 oz (100.2 kg)  Height: 5' 2 (1.575 m)    Body mass index is 40.38 kg/m.    General: Well developed, well nourished female in no apparent distress.  HEENT: AT/[place], no external lesions. Hearing intact to the spoken word Eyes: Conjunctiva clear and no icterus. Neck: Trachea midline, neck supple without appreciable thyromegaly or lymphadenopathy and no palpable thyroid  nodules Lungs: Clear to auscultation, no wheeze. Respirations not labored Heart: S1S2, Regular in rate and rhythm.  Abdomen: Soft, non tender, non distended, no masses, no striae Neurologic: Alert, oriented, normal speech, deep tendon biceps reflexes normal Extremities: No pedal pitting edema, no tremors of outstretched hands Skin: Warm, color good.  Psychiatric: Does not appear depressed or anxious  PERTINENT HISTORIC LABORATORY AND IMAGING STUDIES:  All pertinent laboratory results were reviewed. Please see HPI also for further details.    TSH  Date Value Ref Range Status  02/10/2023 2.11 0.35 - 5.50 uIU/mL Final  09/26/2021 0.89 0.35 - 5.50 uIU/mL Final  08/04/2019 1.00 0.40 - 4.50 mIU/L Final     ASSESSMENT / PLAN  1. Multiple thyroid  nodules     -Patient was diagnosed with multiple thyroid  nodule in 2017, status post FNA in 2017 and 2018 with reportedly benign cytology.  She has a serial monitoring of thyroid  nodules with ultrasound, last done in April 2023 with right-sided stable thyroid  nodules and increase in left thyroid  nodule.showed right thyroid  nodule measuring 1.4 cm, 1.2 cm, 1.2 cm and left thyroid  nodule measuring 1.5 cm. She is euthyroid not on thyroid  medication. - Ultrasound thyroid  in August 2024 was stable bilateral thyroid  nodules.  Plan: -Will check thyroid  function test. -Will continue to monitor thyroid  nodules with serial ultrasound.  No plan for repeat biopsy.  Check ultrasound thyroid  in 1 year summer 2026.   Diagnoses and all orders for this visit:  Multiple thyroid  nodules -     T4, free -     TSH -     US  THYROID ; Future    DISPOSITION Follow up in clinic in  12 months suggested.  All questions answered and patient verbalized understanding of the plan.  Jasmine Terri Rorrer, MD [facility] Endocrinology [facility] [address] [place], [address] [place], [place] [address] Phone # [phone]  At least part of this note was generated using voice recognition software. Inadvertent word errors may have occurred, which were not recognized during the proofreading process.

## 2024-02-11 ENCOUNTER — Ambulatory Visit: Payer: Self-pay | Admitting: Endocrinology

## 2024-02-16 ENCOUNTER — Telehealth: Payer: Self-pay | Admitting: Internal Medicine

## 2024-02-16 NOTE — Telephone Encounter (Signed)
 Primary Cardiologist:None  Chart reviewed as part of pre-operative protocol coverage. Because of Jasmine Woods's past medical history and time since last visit, he/she will require a follow-up visit in order to better assess preoperative cardiovascular risk.  Pre-op covering staff: - Please schedule appointment and call patient to inform them. Patient has not been seen since 05/2019.  - Please contact requesting surgeon's office via preferred method (i.e, phone, fax) to inform them of need for appointment prior to surgery.   Rosaline EMERSON Bane, NP-C  02/16/2024, 12:11 PM [address], [address] [place], [place] [address] Office [phone] Fax [phone]

## 2024-02-16 NOTE — Telephone Encounter (Signed)
   Pre-operative Risk Assessment    Patient Name: Jasmine Woods  DOB: 01/18/1956 MRN: [id number]   Date of last office visit: 06/23/2019 Date of next office visit: n/a   Request for Surgical Clearance    Procedure:  Left THR  Date of Surgery:  Clearance 03/25/24                                Surgeon:  Dr. Leora Surgeon's Group or Practice Name:  [facility] Phone number: [phone] Fax number:  [phone]   Type of Clearance Requested:   - Medical    Type of Anesthesia:  Spinal   Additional requests/questions:    Signed, Gwendolyn H Slade   02/16/2024, 11:35 AM

## 2024-02-17 NOTE — Telephone Encounter (Signed)
 Patient is needing an office visit due to last being seen on 05/2019 sent a message to our scheduling team to schedule appointment procedure is for 03/25/24

## 2024-02-18 ENCOUNTER — Telehealth: Payer: Self-pay | Admitting: Internal Medicine

## 2024-02-18 NOTE — Telephone Encounter (Signed)
 Left message on VM to schedule Would be new patient

## 2024-02-18 NOTE — Telephone Encounter (Signed)
-----   Message from [facility] Bertrand N sent at 02/17/2024  3:31 PM EDT ----- Regarding: office visit needed Patient is needing a preop clearance patient has not been seen since 05/2019 needs an office visit procedure is scheduled for 03/25/24 patient is seen at our [place] office thank you

## 2024-02-19 NOTE — Telephone Encounter (Signed)
 I called the pt to schedule a new pt appt as last seen 2020. Pt tells me that she has been told that the anesthesiologist said she does not need cardiac clearance. I stated that I am going to need this to be confirmed with the surgeon office. Pt gave me the surgery scheduler name Melissa ext [phone].   I will confirm with surgeon office if pt does not need cardiac clearance.

## 2024-02-20 ENCOUNTER — Telehealth: Payer: Self-pay

## 2024-02-20 NOTE — Telephone Encounter (Signed)
 Copied from CRM [id number]. Topic: Clinical - Request for Lab/Test Order >> Feb 20, 2024 11:07 AM Larissa RAMAN wrote: Reason for CRM: Patient calling to see if she can have labs ordered from Ortho provider, completed at clinic. She states she has a written order. Please contact patient to advise.

## 2024-02-24 NOTE — Telephone Encounter (Signed)
 Called [place]  [facility] (Dr Leora office) to confirm if patient still needed Cardiac clearance due to Pt stated the following on the call made to the patient to schedule in office appt: Pt tells me that she has been told that the anesthesiologist said she does not need cardiac clearance. (This statement was made on the phone call with Carol Fiato on 02/19/24)  A message was sent to Dr Seward to confirm if pt is still needing Cardiac Clearance and to call the preop team.

## 2024-02-24 NOTE — Progress Notes (Deleted)
  Cardiology Office Note   Date:  02/24/2024  ID:  Jasmine, Woods 28-Jan-1956, MRN [id number] PCP: Jasmine Mire, MD  Jasmine Woods Providers Cardiologist:  None { Click to update primary MD,subspecialty MD or APP then REFRESH:1}    History of Present Illness Jasmine Woods is a 68 y.o. female PMH DM 2, HTN, myasthenia gravis, asthma who presents for perioperative risk evaluation for an upcoming hip replacement.  ***  Relevant CVD History -Calcium  score of 02/2022 -Essentially normal cardiac MRI 04/2019 (initially concern for amyloid) -Echo with normal biventricular function and no significant valve disease 07/2018 -ETT 05/2018 decreased exercise capacity but no ECG changes or significant hemodynamic aberrations  ROS: Pt denies any chest discomfort, jaw pain, arm pain, palpitations, syncope, presyncope, orthopnea, PND, or LE edema.  Studies Reviewed I have independently reviewed the patient's ECG, ***.  Physical Exam VS:  There were no vitals taken for this visit.       Wt Readings from Last 3 Encounters:  02/10/24 220 lb 12.8 oz (100.2 kg)  01/12/24 220 lb 4.8 oz (99.9 kg)  12/31/23 223 lb 9.6 oz (101.4 kg)    GEN: No acute distress. NECK: No JVD; No carotid bruits. CARDIAC: ***RRR, no murmurs, rubs, gallops. RESPIRATORY:  Clear to auscultation. EXTREMITIES:  Warm and well-perfused. No edema.  ASSESSMENT AND PLAN Perioperative cardiovascular risk assessment RCRI of 0 indicating low perioperative cardiovascular risk for a low risk procedure.  She has no documented/proven cardiovascular disease and denies any concerning symptoms of angina.  She can perform greater than 4 METS.  Therefore, no further testing is indicated prior to surgery.  ***        {Are you ordering a CV Procedure (e.g. stress test, cath, DCCV, TEE, etc)?   Press F2        :[id number]}  Dispo: ***  Signed, Jasmine Poser, MD

## 2024-02-25 NOTE — Telephone Encounter (Signed)
 See previous notes from pt who says she was told did not need cardiac clearance. Our team has also tried to reach the surgeon office to confirm this as well, though have been unable to reach anyone with the surgeon office.   The pt has appt 02/26/24 with Dr. Argentina. Appt notes have been updated to reflect preop clearance needed. I will update all parties involved.

## 2024-02-26 ENCOUNTER — Ambulatory Visit

## 2024-03-10 ENCOUNTER — Other Ambulatory Visit: Payer: Self-pay | Admitting: Family Medicine

## 2024-03-10 DIAGNOSIS — E785 Hyperlipidemia, unspecified: Secondary | ICD-10-CM

## 2024-04-01 ENCOUNTER — Encounter: Payer: Self-pay | Admitting: Family Medicine

## 2024-04-14 ENCOUNTER — Other Ambulatory Visit: Payer: Self-pay | Admitting: Emergency Medicine

## 2024-04-19 ENCOUNTER — Encounter: Payer: Self-pay | Admitting: Neurology

## 2024-04-19 ENCOUNTER — Ambulatory Visit (INDEPENDENT_AMBULATORY_CARE_PROVIDER_SITE_OTHER): Payer: Medicare Other | Admitting: Neurology

## 2024-04-19 VITALS — BP 132/79 | HR 75 | Ht 62.0 in | Wt 212.0 lb

## 2024-04-19 DIAGNOSIS — G250 Essential tremor: Secondary | ICD-10-CM

## 2024-04-19 DIAGNOSIS — G7000 Myasthenia gravis without (acute) exacerbation: Secondary | ICD-10-CM | POA: Diagnosis not present

## 2024-04-19 MED ORDER — PYRIDOSTIGMINE BROMIDE 60 MG PO TABS: ORAL_TABLET | ORAL | 3 refills | Status: AC

## 2024-04-19 NOTE — Progress Notes (Signed)
 Follow-up Visit   Date: 04/19/24   Jasmine Woods MRN: [id number] DOB: 04-Sep-1955   Interim History: Jasmine Woods is a 68 y.o. right-handed Caucasian female with hyperlipidemia, asthma, and diet-controlled diabetes returning to the clinic for follow-up of seronegative myasthenia gravis and essential tremor.  The patient was accompanied to the clinic by self.  IMPRESSION/PLAN: Seronegative ocular myasthenia gravis without exacerbation (diagnosed 2017 with [facility]EM[facility]), stable  - Continue mestinon  60mg  TID at 8am, noon, and 4pm  Essential tremor involving the hands, stable.  Currently tremor is not bothersome to take medications  Bilateral carpal tunnel syndrome s/p release   Return to clinic 1 year  -------------------------------------------------------------------------------------- UPDATE 03/08/2022:  She is here for 1 year follow-up.  She has noticed worsening tremor and has difficulty with fine motor tasks, especially when doing crafts.  No issues with her myasthenia.  Specifically, she denies droopiness of the eyelid or double vision.  Her husband passed away in 2023-10-20 from heart failure.  Daughter lives next door and she has good social support from her children.   UPDATE 04/15/2023:  She is here for 1 year follow-up visit.  Myasthenia gravis is well-controlled on mestinon  60mg  three times daily.  Hand tremors are unchanged and stable.  She does not want to take medication.   She is having a lot of left hip pain since August and is scheduled to see orthopaedics next month.  Pain is worse with weight bearing and improved with rest.  She is taking ibuprofen  600mg  BID which helps the ease the pain.    UPDATE 04/19/2024:   She is here for follow-up visit.  Myasthenia gravis is well controlled on mestinon .  As long has she takes medication on time, she does not have ocular symptoms.  She had left hip surgery 3 weeks ago and is recovering from this.  Tremors are  stable and do not interfere with daily activities. She was recently diagnosed with psoriatic arthritis and will be switching to Enbrel.    Medications:  Current Outpatient Medications on File Prior to Visit  Medication Sig Dispense Refill   acetaminophen  (TYLENOL ) 500 MG tablet Take 1,000 mg by mouth every 6 (six) hours as needed for mild pain. Reported on 07/06/2015     budesonide  (RHINOCORT  AQUA) 32 MCG/ACT nasal spray Place 2 sprays into both nostrils at bedtime. 15 mL 2   cholecalciferol (VITAMIN D3) 25 MCG (1000 UNIT) tablet Take 1,000 Units by mouth daily.     Coenzyme Q10 (CO Q 10) 100 MG CAPS Take 1 capsule by mouth daily. 30 capsule 2   cyanocobalamin  (VITAMIN B12) 500 MCG tablet Take 500 mcg by mouth 3 (three) times a week.     DULoxetine  (CYMBALTA ) 60 MG capsule TAKE 1 CAPSULE (60 MG TOTAL) BY MOUTH DAILY. 90 capsule 1   fluticasone -salmeterol (ADVAIR  HFA) 45-21 MCG/ACT inhaler Inhale 2 puffs into the lungs 2 (two) times daily. 1 each 2   folic acid  (FOLVITE ) 1 MG tablet Take 1 mg by mouth.     glucose blood (FREESTYLE LITE) test strip 100 each by Other route daily. Use one strip daily for blood glucose testing 100 each 12   Lancets (FREESTYLE) lancets Use one lancet daily for blood glucose testing 100 each 12   loratadine  (CLARITIN ) 10 MG tablet Take 10 mg by mouth daily as needed for allergies.     methotrexate (RHEUMATREX) 2.5 MG tablet Take 15 mg by mouth once a week.     montelukast  (SINGULAIR )  10 MG tablet TAKE 1 TABLET BY MOUTH EVERYDAY AT BEDTIME 90 tablet 1   Multiple Vitamins-Minerals (MULTIVITAMIN WITH MINERALS) tablet Take 1 tablet by mouth daily.     Omega-3 Fatty Acids (FISH OIL) 1200 MG CAPS Take 2 capsules by mouth daily.     pyridostigmine  (MESTINON ) 60 MG tablet TAKE 1 TABLET AT 8 AM, NOON, AND AT 4 PM. OK TO TAKE EXTRA DOSE AS NEEDED. 360 tablet 3   telmisartan -hydrochlorothiazide (MICARDIS  HCT) 80-25 MG tablet Take 1 tablet by mouth daily. 90 tablet 1   tirzepatide   (MOUNJARO ) 7.5 MG/0.5ML Pen Inject 7.5 mg into the skin once a week. 6 mL 1   TREMFYA ONE-PRESS 100 MG/ML pen      albuterol  (VENTOLIN  HFA) 108 (90 Base) MCG/ACT inhaler INHALE 2 PUFFS BY MOUTH EVERY 4 HOURS AS NEEDED FOR WHEEZING OR SHORTNESS OF BREATH. (Patient not taking: Reported on 04/19/2024) 18 g 0   No current facility-administered medications on file prior to visit.    Allergies:  Allergies  Allergen Reactions   Aspirin  Swelling    Upset stomach  Upset stomach    Azithromycin     Contraindicated in Myasthenia    Ace Inhibitors     cough   Amlodipine     Edema of feet and hands   Gabapentin     anxiety   Pregabalin Other (See Comments)    anxiety     Vital Signs:  BP 132/79   Pulse 75   Ht 5' 2 (1.575 m)   Wt 212 lb (96.2 kg)   SpO2 98%   BMI 38.78 kg/m   Neurological Exam: MENTAL STATUS including orientation to time, place, person, recent and remote memory, attention span and concentration, language, and fund of knowledge is normal.  Speech is not dysarthric.  CRANIAL NERVES:  Pupils equal round and reactive to light.  Mildly restricted lateral gaze in the right eye, otherwise normal conjugate, extra-ocular eye movements in all directions of gaze.  No ptosis.  Face is symmetric.  Facial muscles are 5/5.    MOTOR:  Motor strength is 5/5 in all extremities.  There is mild-moderate intention tremor bilaterally in the hands, no rest tremor.    MSRs:  Reflexes are 2+/4 throughout.  SENSORY:  Intact to vibration throughout   COORDINATION/GAIT:  Gait is stable, assisted with walker.  She is able to walk unassisted, also.  Data: NCS/EMG of the arms 04/07/2019:  Bilateral median neuropathy at or distal to the wrist, consistent with a clinical diagnosis of carpal tunnel syndrome.  Overall, these findings are moderate in degree electrically and worse on the left.  NCS/EMG of the legs 07/04/2023: This is a normal study of the lower extremities.  In particular, there  is no evidence of a large fiber sensorimotor polyneuropathy or lumbosacral radiculopathy.      Thank you for allowing me to participate in patient's care.  If I can answer any additional questions, I would be pleased to do so.    Sincerely,    Kiely Cousar K. Tobie, DO

## 2024-05-25 ENCOUNTER — Other Ambulatory Visit: Payer: Self-pay

## 2024-05-25 MED ORDER — FLUZONE HIGH-DOSE 0.5 ML IM SUSY
0.5000 mL | PREFILLED_SYRINGE | Freq: Once | INTRAMUSCULAR | 0 refills | Status: AC
  Filled 2024-05-25: qty 0.5, 1d supply, fill #0

## 2024-06-16 ENCOUNTER — Other Ambulatory Visit: Payer: Self-pay | Admitting: Family Medicine

## 2024-06-16 DIAGNOSIS — E785 Hyperlipidemia, unspecified: Secondary | ICD-10-CM

## 2024-06-18 NOTE — Telephone Encounter (Signed)
 No longer listed on current medication list Requested Prescriptions  Pending Prescriptions Disp Refills   rosuvastatin  (CRESTOR ) 20 MG tablet [Pharmacy Med Name: ROSUVASTATIN  CALCIUM  20 MG TAB] 90 tablet 1    Sig: TAKE 1 TABLET BY MOUTH EVERY DAY     Cardiovascular:  Antilipid - Statins 2 Failed - 06/18/2024  2:09 PM      Failed - Lipid Panel in normal range within the last 12 months    Cholesterol, Total  Date Value Ref Range Status  03/05/2018 175 100 - 199 mg/dL Final   Cholesterol  Date Value Ref Range Status  01/12/2024 145 <200 mg/dL Final  88/84/7986 846 0 - 200 mg/dL Final   Ldl Cholesterol, Calc  Date Value Ref Range Status  05/08/2012 77 0 - 100 mg/dL Final   LDL Cholesterol (Calc)  Date Value Ref Range Status  01/12/2024 70 mg/dL (calc) Final    Comment:    Reference range: <100 . Desirable range <100 mg/dL for primary prevention;   <70 mg/dL for patients with CHD or diabetic patients  with > or = 2 CHD risk factors. SABRA LDL-C is now calculated using the Martin-Hopkins  calculation, which is a validated novel method providing  better accuracy than the Friedewald equation in the  estimation of LDL-C.  Gladis APPLETHWAITE et al. SANDREA. 7986;689(80): 2061-2068  (http://education.[employer].com/faq/FAQ164)    HDL Cholesterol  Date Value Ref Range Status  05/08/2012 46 40 - 60 mg/dL Final   HDL  Date Value Ref Range Status  01/12/2024 59 > OR = 50 mg/dL Final  90/87/7980 61 >60 mg/dL Final   Triglycerides  Date Value Ref Range Status  01/12/2024 78 <150 mg/dL Final  88/84/7986 847 0 - 200 mg/dL Final         Passed - Cr in normal range and within 360 days    Creat  Date Value Ref Range Status  07/11/2023 0.74 0.50 - 1.05 mg/dL Final   Creatinine, Urine  Date Value Ref Range Status  01/12/2024 126 20 - 275 mg/dL Final         Passed - Patient is not pregnant      Passed - Valid encounter within last 12 months    Recent Outpatient Visits            5 months ago Type 2 diabetes, controlled, with neuropathy [facility])   [place] [facility] Sowles, Krichna, MD   9 months ago Type 2 diabetes, controlled, with neuropathy [facility])   [place] [facility] Sowles, Krichna, MD   10 months ago Arthralgia, unspecified joint   [facility] Glenard Mire, MD       Future Appointments             In 3 weeks Sowles, Krichna, MD [facility] [place]   In 5 months Stoioff, Glendia BROCKS, MD [facility] [place]

## 2024-07-14 ENCOUNTER — Ambulatory Visit: Admitting: Family Medicine

## 2024-07-14 VITALS — BP 118/76 | HR 71 | Resp 16 | Ht 62.0 in | Wt 209.8 lb

## 2024-07-14 DIAGNOSIS — G7000 Myasthenia gravis without (acute) exacerbation: Secondary | ICD-10-CM

## 2024-07-14 DIAGNOSIS — E119 Type 2 diabetes mellitus without complications: Secondary | ICD-10-CM

## 2024-07-14 DIAGNOSIS — Z7985 Long-term (current) use of injectable non-insulin antidiabetic drugs: Secondary | ICD-10-CM

## 2024-07-14 DIAGNOSIS — I10 Essential (primary) hypertension: Secondary | ICD-10-CM

## 2024-07-14 DIAGNOSIS — E042 Nontoxic multinodular goiter: Secondary | ICD-10-CM

## 2024-07-14 DIAGNOSIS — E1140 Type 2 diabetes mellitus with diabetic neuropathy, unspecified: Secondary | ICD-10-CM

## 2024-07-14 DIAGNOSIS — J4530 Mild persistent asthma, uncomplicated: Secondary | ICD-10-CM

## 2024-07-14 DIAGNOSIS — J3089 Other allergic rhinitis: Secondary | ICD-10-CM

## 2024-07-14 DIAGNOSIS — E66812 Obesity, class 2: Secondary | ICD-10-CM

## 2024-07-14 DIAGNOSIS — L4050 Arthropathic psoriasis, unspecified: Secondary | ICD-10-CM

## 2024-07-14 LAB — COMPREHENSIVE METABOLIC PANEL WITH GFR
AG Ratio: 1.6 (calc) (ref 1.0–2.5)
ALT: 25 U/L (ref 6–29)
AST: 20 U/L (ref 10–35)
Albumin: 4.1 g/dL (ref 3.6–5.1)
Alkaline phosphatase (APISO): 58 U/L (ref 37–153)
BUN: 20 mg/dL (ref 7–25)
CO2: 26 mmol/L (ref 20–32)
Calcium: 9.3 mg/dL (ref 8.6–10.4)
Chloride: 102 mmol/L (ref 98–110)
Creat: 0.62 mg/dL (ref 0.50–1.05)
Globulin: 2.5 g/dL (ref 1.9–3.7)
Glucose, Bld: 71 mg/dL (ref 65–99)
Potassium: 3.9 mmol/L (ref 3.5–5.3)
Sodium: 139 mmol/L (ref 135–146)
Total Bilirubin: 0.4 mg/dL (ref 0.2–1.2)
Total Protein: 6.6 g/dL (ref 6.1–8.1)
eGFR: 96 mL/min/1.73m2

## 2024-07-14 LAB — POCT GLYCOSYLATED HEMOGLOBIN (HGB A1C): Hemoglobin A1C: 5.6 % (ref 4.0–5.6)

## 2024-07-14 MED ORDER — TELMISARTAN-HCTZ 80-25 MG PO TABS: 1.0000 | ORAL_TABLET | Freq: Every day | ORAL | 1 refills | Status: AC

## 2024-07-14 MED ORDER — ROSUVASTATIN CALCIUM 20 MG PO TABS: 20.0000 mg | ORAL_TABLET | Freq: Every day | ORAL | 1 refills | Status: AC

## 2024-07-14 MED ORDER — DULOXETINE HCL 60 MG PO CPEP: ORAL_CAPSULE | Freq: Every day | ORAL | 1 refills | Status: AC

## 2024-07-14 MED ORDER — TIRZEPATIDE 7.5 MG/0.5ML ~~LOC~~ SOAJ: 7.5000 mg | SUBCUTANEOUS | 1 refills | Status: AC

## 2024-07-14 MED ORDER — MONTELUKAST SODIUM 10 MG PO TABS: 10.0000 mg | ORAL_TABLET | Freq: Every day | ORAL | 1 refills | Status: AC

## 2024-07-14 MED ORDER — BUDESONIDE-FORMOTEROL FUMARATE 80-4.5 MCG/ACT IN AERO: 2.0000 | INHALATION_SPRAY | Freq: Two times a day (BID) | RESPIRATORY_TRACT | 1 refills | Status: AC | PRN

## 2024-07-14 NOTE — Progress Notes (Signed)
 Name: Jasmine Woods   MRN: [id number]    DOB: Jun 27, 1955   Date:07/14/2024       Progress Note  Subjective  Chief Complaint  Chief Complaint  Patient presents with   Medical Management of Chronic Issues   Discussed the use of AI scribe software for clinical note transcription with the patient, who gave verbal consent to proceed.  History of Present Illness Jasmine Woods is a 69 year old female with psoriatic arthritis and type 2 diabetes who presents for a follow-up visit.  She has experienced significant improvement in her psoriatic arthritis symptoms since starting Enbrel on June 17, 2024. Previously, Tremfya was ineffective for tendon involvement. She is currently on methotrexate 15 mg weekly and Enbrel 50 mg weekly. Her pain level has decreased to 2 out of 10.  She underwent a hip replacement on March 26, 2024, and has completed her therapy, reporting satisfaction with the outcome and improved mobility.  Her type 2 diabetes is well-controlled with Mounjaro  7.5 mg weekly. Her A1c is 5.0. No symptoms of hyperglycemia or hypoglycemia, such as excessive hunger, thirst, or frequent urination. Her weight has decreased from 212 to 209 pounds.  She experiences diabetic neuropathy with high sensitivity in her feet, managed with Cymbalta , which she finds helpful.  She has hypertension, managed with Carvedilol 80/25 mg, and her home blood pressure readings are stable around 118/76 mmHg. No lightheadedness or dizziness with her current blood pressure management.  She has seronegative myasthenia gravis, managed with Mestinon , taken three times daily, with an additional dose as needed for symptoms like double vision.  She has intermittent asthma, for which she uses Advair  as needed, particularly during viral illnesses. She recently had a viral infection in December 2025, during which she paused Enbrel for two doses.  She has perennial allergic rhinitis, managed with  over-the-counter medications, and she uses omega-3, CoQ10, and vitamin D  supplements. She was previously on Crestor  20 mg for cholesterol, which she needs to resume.  She maintains an active social life, dining with her daughter and son regularly, and engages in social activities with friends. She also cares for three cats, which she adopted as strays.    Patient Active Problem List   Diagnosis Date Noted   Psoriatic arthritis (HCC) 01/12/2024   Diabetes mellitus with coincident hypertension (HCC) 01/12/2024   B12 deficiency 01/12/2024   Unilateral primary osteoarthritis, left hip 06/17/2023   Multinodular goiter 11/25/2018   Essential hypertension 11/11/2018   DOE (dyspnea on exertion) 04/15/2018   Diaphoresis 11/26/2017   Epiretinal membrane (ERM) of left eye 09/09/2016   Glaucoma, left eye 09/05/2016   Seronegative myasthenia gravis (HCC) 08/08/2015   Stress incontinence 07/06/2015   Chronic tension-type headache, intractable 05/29/2015   Anxiety and depression 04/12/2015   Asthma, mild intermittent 04/12/2015   Binocular vision disorder with diplopia 04/12/2015   Dyslipidemia 04/12/2015   Hemorrhoids, internal 04/12/2015   Nuclear sclerotic cataract 04/12/2015   Morbid obesity (HCC) 04/12/2015   OSA on CPAP 04/12/2015   Perennial allergic rhinitis 04/12/2015   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/12/2015   Type 2 diabetes, controlled, with neuropathy (HCC) 04/12/2015   Vitamin D  deficiency 04/12/2015   History of bariatric surgery 10/12/2012    Past Surgical History:  Procedure Laterality Date   ABDOMINAL HYSTERECTOMY  1991   BARIATRIC SURGERY  2014   CARPAL TUNNEL RELEASE Left 06/08/2019   Procedure: LEFT CARPAL TUNNEL RELEASE;  Surgeon: Vernetta Lonni GRADE, MD;  Location: [facility];  Service: Orthopedics;  Laterality: Left;   CARPAL TUNNEL RELEASE Right 08/19/2019   Procedure: RIGHT CARPAL TUNNEL RELEASE;  Surgeon: Vernetta Lonni GRADE, MD;  Location: [facility];  Service: Orthopedics;  Laterality: Right;   CATARACT EXTRACTION W/PHACO Left 01/28/2017   Procedure: CATARACT EXTRACTION PHACO AND INTRAOCULAR LENS PLACEMENT (IOC);  Surgeon: Jaye Fallow, MD;  Location: [facility];  Service: Ophthalmology;  Laterality: Left;  US  00:37 AP% 17.8 CDE 6.61 Fluid pack lot # [id number]   CATARACT EXTRACTION W/PHACO Right 05/20/2023   Procedure: CATARACT EXTRACTION PHACO AND INTRAOCULAR LENS PLACEMENT (IOC) RIGHT DIABETIC  7.35  00:46.2;  Surgeon: Jaye Fallow, MD;  Location: [facility];  Service: Ophthalmology;  Laterality: Right;   COLONOSCOPY  2012   COLONOSCOPY WITH PROPOFOL  N/A 09/30/2016   Procedure: COLONOSCOPY WITH PROPOFOL ;  Surgeon: Rogelia Copping, MD;  Location: [facility];  Service: Endoscopy;  Laterality: N/A;  diabetic - diet controlled sleep apnea   EYE SURGERY     Vitrectomy Epiretinal Membrane Peel left eye   KNEE SURGERY Right 1979-1980   OOPHORECTOMY     PARS PLANA VITRECTOMY Left 03/27/2016   Procedure: PARS PLANA VITRECTOMY WITH 25 GAUGE AND MEMBRANE PEEL, INTERNAL LIMITING MEMBRANE;  Surgeon: Arley DELENA Ruder, MD;  Location: [facility];  Service: Ophthalmology;  Laterality: Left;   THYROID  SURGERY     BX done was benign   UPPER GI ENDOSCOPY     VITRECTOMY Left 04/27/2016    Family History  Problem Relation Age of Onset   Diabetes Mother    Hypertension Mother    Heart disease Mother 16       stent x 1    Hyperlipidemia Mother    Kidney disease Mother    Obesity Mother    Migraines Sister    Early death Sister    Stroke Maternal Grandfather    Diabetes Paternal Grandfather    Muscular dystrophy Sister    Heart attack Maternal Aunt    ADD / ADHD Son    Breast cancer Neg Hx    Thyroid  disease Neg Hx     Social History   Tobacco Use   Smoking status: Former    Current packs/day: 0.00    Average packs/day: 0.5 packs/day for 3.0 years (1.5 ttl pk-yrs)     Types: Cigarettes    Start date: 06/24/1972    Quit date: 06/25/1975    Years since quitting: 49.0   Smokeless tobacco: Never   Tobacco comments:    smoked as teenager  Substance Use Topics   Alcohol use: No    Alcohol/week: 0.0 standard drinks of alcohol    Current Medications[1]  Allergies[2]  I personally reviewed active problem list, medication list, allergies, family history with the patient/caregiver today.   ROS  Ten systems reviewed and is negative except as mentioned in HPI    Objective Physical Exam VITALS: BP- 118/76 MEASUREMENTS: Weight- 209, BMI- 38.0. CONSTITUTIONAL: Patient appears well-developed and well-nourished.  No distress. HEENT: Head atraumatic, normocephalic, neck supple. CARDIOVASCULAR: Normal rate, regular rhythm and normal heart sounds.  No murmur heard. No BLE edema. PULMONARY: Effort normal and breath sounds normal. No respiratory distress. ABDOMINAL: There is no tenderness or distention. MUSCULOSKELETAL: Normal gait. Without gross motor or sensory deficit. PSYCHIATRIC: Patient has a normal mood and affect. behavior is normal. Judgment and thought content normal.  Vitals:   07/14/24 1313  BP: 118/76  Pulse: 71  Resp: 16  SpO2: 99%  Weight: 209  lb 12.8 oz (95.2 kg)  Height: 5' 2 (1.575 m)    Body mass index is 38.37 kg/m.  Recent Results (from the past 2160 hours)  POCT glycosylated hemoglobin (Hb A1C)     Status: None   Collection Time: 07/14/24  1:22 PM  Result Value Ref Range   Hemoglobin A1C 5.6 4.0 - 5.6 %   HbA1c POC (<> result, manual entry)     HbA1c, POC (prediabetic range)     HbA1c, POC (controlled diabetic range)      Diabetic Foot Exam:     PHQ2/9:    07/14/2024    1:08 PM 01/12/2024    2:09 PM 10/08/2023    1:20 PM 09/12/2023    2:25 PM 08/08/2023   11:02 AM  Depression screen PHQ 2/9  Decreased Interest 0 0 0 0 0  Down, Depressed, Hopeless 0 0 0 0 0  PHQ - 2 Score 0 0 0 0 0  Altered sleeping   3 0 0  Tired,  decreased energy   3 0 0  Change in appetite   1 0 0  Feeling bad or failure about yourself    0 0 0  Trouble concentrating   0 0 0  Moving slowly or fidgety/restless   0 0 0  Suicidal thoughts   0 0 0  PHQ-9 Score   7  0  0   Difficult doing work/chores   Somewhat difficult Not difficult at all Not difficult at all     Data saved with a previous flowsheet row definition    phq 9 is negative  Fall Risk:    07/14/2024    1:08 PM 04/19/2024    2:30 PM 01/12/2024    2:09 PM 10/08/2023    9:57 AM 10/07/2023    2:39 PM  Fall Risk   Falls in the past year? 0 0 0 0  0   Number falls in past yr: 0 0 0 0   Injury with Fall? 0 0  0  0    Risk for fall due to : No Fall Risks  No Fall Risks No Fall Risks   Follow up Falls evaluation completed Falls evaluation completed Falls evaluation completed Falls prevention discussed;Falls evaluation completed      Manually entered by patient   Data saved with a previous flowsheet row definition      Assessment & Plan Type 2 diabetes mellitus complicated by diabetic neuropathy Diabetes with A1c of 5.0. Weight decreased to 209 lbs. Cymbalta  effective for neuropathy. - Continue Mounjaro  7.5 mg weekly. - Continue Cymbalta . - Monitor blood glucose and hypoglycemia symptoms. - Encouraged protein intake with meals.  Psoriatic arthritis Significant symptom improvement with Enbrel. Pain reduced to 2/10. Effective for tendon involvement and inflammation. - Continue Enbrel 50 mg weekly. - Continue methotrexate 15 mg weekly. - Continue folic acid  supplementation.  Seronegative myasthenia gravis Well-managed with Mestinon . Aware of extra dose need during travel. - Continue Mestinon  as prescribed. - Monitor for symptoms, especially during travel.  Morbid obesity BMI 38. Weight decreased to 209 lbs. Encouraged further weight loss to reduce BMI below 35. - Encouraged continued weight loss efforts.  Hypertension in the setting of type 2 diabetes  mellitus Blood pressure managed with Carvedilol HCTZ. Weight loss may require future dosage adjustments. - Continue Carvedilol HCTZ 80/25 mg daily. - Monitor blood pressure regularly. - Educated on hypotension signs and medication adjustment.  Perennial allergic rhinitis Managed with over-the-counter medications. - Continue current over-the-counter  allergy management.  Mild persistent asthma Breyna  prescribed as alternative to Advair . - Use Breyna  as needed. - Monitor for exacerbations, especially during respiratory illnesses.  Hyperlipidemia Previously well-controlled on Crestor . Informed about importance of continuing medication. - Refilled Crestor  20 mg daily. - Monitor cholesterol levels regularly.        [1]  Current Outpatient Medications:    acetaminophen  (TYLENOL ) 500 MG tablet, Take 1,000 mg by mouth every 6 (six) hours as needed for mild pain. Reported on 07/06/2015, Disp: , Rfl:    budesonide  (RHINOCORT  AQUA) 32 MCG/ACT nasal spray, Place 2 sprays into both nostrils at bedtime., Disp: 15 mL, Rfl: 2   cholecalciferol (VITAMIN D3) 25 MCG (1000 UNIT) tablet, Take 1,000 Units by mouth daily., Disp: , Rfl:    Coenzyme Q10 (CO Q 10) 100 MG CAPS, Take 1 capsule by mouth daily., Disp: 30 capsule, Rfl: 2   cyanocobalamin  (VITAMIN B12) 500 MCG tablet, Take 500 mcg by mouth 3 (three) times a week., Disp: , Rfl:    DULoxetine  (CYMBALTA ) 60 MG capsule, TAKE 1 CAPSULE (60 MG TOTAL) BY MOUTH DAILY., Disp: 90 capsule, Rfl: 1   fluticasone -salmeterol (ADVAIR  HFA) 45-21 MCG/ACT inhaler, Inhale 2 puffs into the lungs 2 (two) times daily., Disp: 1 each, Rfl: 2   folic acid  (FOLVITE ) 1 MG tablet, Take 1 mg by mouth., Disp: , Rfl:    glucose blood (FREESTYLE LITE) test strip, 100 each by Other route daily. Use one strip daily for blood glucose testing, Disp: 100 each, Rfl: 12   Lancets (FREESTYLE) lancets, Use one lancet daily for blood glucose testing, Disp: 100 each, Rfl: 12   loratadine   (CLARITIN ) 10 MG tablet, Take 10 mg by mouth daily as needed for allergies., Disp: , Rfl:    methotrexate (RHEUMATREX) 2.5 MG tablet, Take 15 mg by mouth once a week., Disp: , Rfl:    montelukast  (SINGULAIR ) 10 MG tablet, TAKE 1 TABLET BY MOUTH EVERYDAY AT BEDTIME, Disp: 90 tablet, Rfl: 1   Multiple Vitamins-Minerals (MULTIVITAMIN WITH MINERALS) tablet, Take 1 tablet by mouth daily., Disp: , Rfl:    Omega-3 Fatty Acids (FISH OIL) 1200 MG CAPS, Take 2 capsules by mouth daily., Disp: , Rfl:    pyridostigmine  (MESTINON ) 60 MG tablet, TAKE 1 TABLET AT 8 AM, NOON, AND AT 4 PM. OK TO TAKE EXTRA DOSE AS NEEDED., Disp: 360 tablet, Rfl: 3   telmisartan -hydrochlorothiazide (MICARDIS  HCT) 80-25 MG tablet, Take 1 tablet by mouth daily., Disp: 90 tablet, Rfl: 1   tirzepatide  (MOUNJARO ) 7.5 MG/0.5ML Pen, Inject 7.5 mg into the skin once a week., Disp: 6 mL, Rfl: 1   albuterol  (VENTOLIN  HFA) 108 (90 Base) MCG/ACT inhaler, INHALE 2 PUFFS BY MOUTH EVERY 4 HOURS AS NEEDED FOR WHEEZING OR SHORTNESS OF BREATH. (Patient not taking: Reported on 04/19/2024), Disp: 18 g, Rfl: 0   TREMFYA ONE-PRESS 100 MG/ML pen, , Disp: , Rfl:  [2]  Allergies Allergen Reactions   Aspirin  Swelling    Upset stomach  Upset stomach    Azithromycin     Contraindicated in Myasthenia    Ace Inhibitors     cough   Amlodipine     Edema of feet and hands   Gabapentin     anxiety   Pregabalin Other (See Comments)    anxiety

## 2024-07-15 ENCOUNTER — Ambulatory Visit: Payer: Self-pay | Admitting: Family Medicine

## 2024-09-17 ENCOUNTER — Ambulatory Visit

## 2024-09-24 ENCOUNTER — Ambulatory Visit

## 2024-10-11 ENCOUNTER — Ambulatory Visit

## 2024-12-08 ENCOUNTER — Ambulatory Visit: Admitting: Urology

## 2024-12-22 ENCOUNTER — Other Ambulatory Visit

## 2025-01-07 ENCOUNTER — Ambulatory Visit: Admitting: Family Medicine

## 2025-02-09 ENCOUNTER — Ambulatory Visit: Admitting: Endocrinology

## 2025-04-19 ENCOUNTER — Ambulatory Visit: Admitting: Neurology
